# Patient Record
Sex: Male | Born: 1935 | Race: Black or African American | Hispanic: No | Marital: Single | Smoking: Former smoker
Health system: Southern US, Community
[De-identification: ages and names within clinical notes are randomized; demographics above are authoritative.]

## PROBLEM LIST (undated history)

## (undated) DIAGNOSIS — I351 Nonrheumatic aortic (valve) insufficiency: Secondary | ICD-10-CM

## (undated) DIAGNOSIS — M1990 Unspecified osteoarthritis, unspecified site: Secondary | ICD-10-CM

## (undated) DIAGNOSIS — N183 Chronic kidney disease, stage 3 unspecified: Secondary | ICD-10-CM

## (undated) DIAGNOSIS — E785 Hyperlipidemia, unspecified: Secondary | ICD-10-CM

## (undated) DIAGNOSIS — F0390 Unspecified dementia without behavioral disturbance: Secondary | ICD-10-CM

## (undated) DIAGNOSIS — I10 Essential (primary) hypertension: Secondary | ICD-10-CM

## (undated) DIAGNOSIS — I5032 Chronic diastolic (congestive) heart failure: Secondary | ICD-10-CM

## (undated) DIAGNOSIS — I739 Peripheral vascular disease, unspecified: Secondary | ICD-10-CM

## (undated) DIAGNOSIS — J449 Chronic obstructive pulmonary disease, unspecified: Secondary | ICD-10-CM

## (undated) DIAGNOSIS — J123 Human metapneumovirus pneumonia: Secondary | ICD-10-CM

## (undated) DIAGNOSIS — J984 Other disorders of lung: Secondary | ICD-10-CM

## (undated) DIAGNOSIS — I340 Nonrheumatic mitral (valve) insufficiency: Secondary | ICD-10-CM

## (undated) DIAGNOSIS — I2119 ST elevation (STEMI) myocardial infarction involving other coronary artery of inferior wall: Secondary | ICD-10-CM

## (undated) HISTORY — PX: TONSILLECTOMY: SUR1361

## (undated) HISTORY — DX: Hyperlipidemia, unspecified: E78.5

## (undated) HISTORY — PX: LEG SURGERY: SHX1003

## (undated) HISTORY — PX: APPENDECTOMY: SHX54

---

## 1997-12-22 ENCOUNTER — Other Ambulatory Visit: Admission: RE | Admit: 1997-12-22 | Discharge: 1997-12-22 | Payer: Self-pay | Admitting: Internal Medicine

## 2002-04-27 ENCOUNTER — Emergency Department (HOSPITAL_COMMUNITY): Admission: EM | Admit: 2002-04-27 | Discharge: 2002-04-27 | Payer: Self-pay | Admitting: Emergency Medicine

## 2002-12-05 ENCOUNTER — Emergency Department (HOSPITAL_COMMUNITY): Admission: EM | Admit: 2002-12-05 | Discharge: 2002-12-05 | Payer: Self-pay | Admitting: Emergency Medicine

## 2002-12-05 ENCOUNTER — Encounter: Payer: Self-pay | Admitting: Emergency Medicine

## 2003-06-25 ENCOUNTER — Ambulatory Visit (HOSPITAL_COMMUNITY): Admission: RE | Admit: 2003-06-25 | Discharge: 2003-06-25 | Payer: Self-pay | Admitting: Gastroenterology

## 2003-06-25 ENCOUNTER — Encounter (INDEPENDENT_AMBULATORY_CARE_PROVIDER_SITE_OTHER): Payer: Self-pay | Admitting: Specialist

## 2003-07-16 ENCOUNTER — Encounter: Admission: RE | Admit: 2003-07-16 | Discharge: 2003-07-16 | Payer: Self-pay | Admitting: Gastroenterology

## 2004-07-22 ENCOUNTER — Ambulatory Visit: Payer: Self-pay | Admitting: Cardiology

## 2004-07-23 ENCOUNTER — Encounter: Payer: Self-pay | Admitting: Cardiology

## 2004-07-23 ENCOUNTER — Inpatient Hospital Stay (HOSPITAL_COMMUNITY): Admission: EM | Admit: 2004-07-23 | Discharge: 2004-07-24 | Payer: Self-pay | Admitting: Emergency Medicine

## 2004-12-05 ENCOUNTER — Emergency Department (HOSPITAL_COMMUNITY): Admission: EM | Admit: 2004-12-05 | Discharge: 2004-12-06 | Payer: Self-pay | Admitting: *Deleted

## 2005-03-18 ENCOUNTER — Emergency Department (HOSPITAL_COMMUNITY): Admission: EM | Admit: 2005-03-18 | Discharge: 2005-03-18 | Payer: Self-pay | Admitting: Family Medicine

## 2007-05-21 ENCOUNTER — Emergency Department (HOSPITAL_COMMUNITY): Admission: EM | Admit: 2007-05-21 | Discharge: 2007-05-21 | Payer: Self-pay | Admitting: Emergency Medicine

## 2007-06-29 ENCOUNTER — Emergency Department (HOSPITAL_COMMUNITY): Admission: EM | Admit: 2007-06-29 | Discharge: 2007-06-29 | Payer: Self-pay | Admitting: Emergency Medicine

## 2007-07-16 ENCOUNTER — Emergency Department (HOSPITAL_COMMUNITY): Admission: EM | Admit: 2007-07-16 | Discharge: 2007-07-16 | Payer: Self-pay | Admitting: Emergency Medicine

## 2007-07-23 ENCOUNTER — Emergency Department (HOSPITAL_COMMUNITY): Admission: EM | Admit: 2007-07-23 | Discharge: 2007-07-23 | Payer: Self-pay | Admitting: Emergency Medicine
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
## 2010-12-31 NOTE — Op Note (Signed)
[name] [name], [name] [name]                           ACCOUNT NO.:  [id number]   MEDICAL RECORD NO.:  [id number]                   PATIENT TYPE:  AMB   LOCATION:  ENDO                                 FACILITY:  [facility]   PHYSICIAN:  Anselmo Rod, M.D.               DATE OF BIRTH:  [birth year]   DATE OF PROCEDURE:  06/25/2003  DATE OF DISCHARGE:                                 OPERATIVE REPORT   PROCEDURE:  Screening colonoscopy.   ENDOSCOPIST:  Charna Elizabeth, M.D.   INSTRUMENT USED:  Olympus video colonoscope.   INDICATIONS FOR PROCEDURE:  Guaiac positive stools and mild anemia in a 77-  year-old [place] American male, rule out colonic polyps, masses, etc.   PREPROCEDURE PREPARATION:  Informed consent was obtained from the patient.  The patient was fasted for eight hours prior to the procedure and prepped  with a bottle of magnesium citrate and a gallon of GoLYTELY the night prior  to the procedure.   PREPROCEDURE PHYSICAL:  Patient with stable vital signs.  Neck supple.  Chest clear to auscultation.  S1 and S2 regular.  Abdomen soft with normal  bowel sounds.   DESCRIPTION OF PROCEDURE:  The patient was placed in the left lateral  decubitus position, sedated with 100 mg of Demerol and 10 mg Versed for the  EGD, no additional sedation was used for the colonoscopy.  Once the patient  was adequately sedated, maintained on low flow oxygen, continuous cardiac  monitoring, the Olympus video colonoscope was advanced from the rectum to  the proximal right colon with difficulty.  There was a large amount of  residual stool in the colon.  The patient was obese and it was difficult for  [place] to change him from a left lateral decubitus to supine and then right  lateral position as well as gentle abdominal pressure was applied.  In spite  of several measures, we could not visualize the cecal base.  There was a  large amount of residual stool in the right colon.  Multiple washes were  done.   Visualization was not adequate.   IMPRESSION:  1. Incomplete colonoscopy to the proximal right colon, no masses or polyps     seen, no evidence of diverticulosis.  2. Small internal hemorrhoids seen on retroflexion.    RECOMMENDATIONS:  Schedule air contrast barium enema after repeat prep to  rule out colonic masses, etc., and to visualize the cecal base.  Further  recommendations will be made after this.                                               Anselmo Rod, M.D.    JNM/MEDQ  D:  06/25/2003  T:  06/25/2003  Job:  [id number]   cc:  Merlene Laughter. Renae Gloss, M.D.  [address]  [address]  [place]  [place] [address]  Fax: [phone]

## 2010-12-31 NOTE — Op Note (Signed)
[name], [name] [name]                           ACCOUNT NO.:  [id number]   MEDICAL RECORD NO.:  [id number]                   PATIENT TYPE:  AMB   LOCATION:  ENDO                                 FACILITY:  [facility]   PHYSICIAN:  Anselmo Rod, M.D.               DATE OF BIRTH:  11-12-1930   DATE OF PROCEDURE:  06/25/2003  DATE OF DISCHARGE:                                 OPERATIVE REPORT   PROCEDURE:  Esophagogastroduodenoscopy with biopsy.   ENDOSCOPIST:  Charna Elizabeth, M.D.   INSTRUMENT USED:  Olympus video panendoscope.   INDICATIONS FOR PROCEDURE:  77 year old African American male with guaiac  positive stool and anemia, rule out peptic ulcer disease, esophagitis,  gastritis, etc.   PREPROCEDURE PREPARATION:  Informed consent was obtained from the patient.  The patient was fasted for eight hours prior to the procedure.   PREPROCEDURE PHYSICAL:  Patient with stable vital signs.  Neck supple.  Chest clear to auscultation.  S1 and S2 regular.  Abdomen soft with normal  bowel sounds.   DESCRIPTION OF PROCEDURE:  The patient was placed in the left lateral  decubitus position, sedated with 100 mg of Demerol and 10 mg Versed in slow  incremental doses.  Once the patient was adequately sedated, maintained on  low flow oxygen, and continuous cardiac monitoring, the Olympus video  panendoscope was advanced through the mouth piece over the tongue into the  esophagus under direct vision.  The entire esophagus appeared normal with no  evidence of ring, strictures, masses, esophagitis, or Barrett's mucosa.  The  scope was then advanced into the stomach.  A 4 cm hiatal hernia was seen on  high retroflexion.  A small superficial ulcer was seen in the antrum.  Biopsies were done to rule out H. pylori by pathology.  The proximal small  bowel appeared normal.   IMPRESSION:  1. Normal appearing esophagus and proximal small bowel.  2. 3-4 cm  hiatal hernia.  3. Superficial ulcer in the  antrum, biopsies done for H. pylori, results     pending.   RECOMMENDATIONS:  1. Treat with PPI.  2. Give antibiotics if H. pylori present on pathology.  3.     Avoid all nonsteroidals and aspirin for now.  4. Proceed with colonoscopy at this time, further recommendations will be     made after the colonoscopy has been done.                                               Anselmo Rod, M.D.    JNM/MEDQ  D:  06/25/2003  T:  06/25/2003  Job:  [id number]   cc:   Merlene Laughter. Renae Gloss, M.D.  [address]  [address]  [place]  [place] [address]  Fax: [phone]
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
## 2011-05-23 LAB — CBC
HCT: 39.8
HCT: 42.3
Hemoglobin: 13.9
Hemoglobin: 14.4
MCHC: 33.9
MCHC: 34.9
MCV: 97.6
MCV: 98.7
Platelets: 313
Platelets: 333
RBC: 4.07 — ABNORMAL LOW
RBC: 4.29
RDW: 12.6
RDW: 12.7
WBC: 6.7
WBC: 8.4

## 2011-05-23 LAB — POCT I-STAT 3, ART BLOOD GAS (G3+)
Acid-Base Excess: 5 — ABNORMAL HIGH
Bicarbonate: 30.3 — ABNORMAL HIGH
O2 Saturation: 99
Operator id: 296031
TCO2: 32
pCO2 arterial: 46 — ABNORMAL HIGH
pH, Arterial: 7.427
pO2, Arterial: 114 — ABNORMAL HIGH

## 2011-05-23 LAB — DIFFERENTIAL
Basophils Absolute: 0.1
Basophils Absolute: 0.1
Basophils Relative: 1
Basophils Relative: 2 — ABNORMAL HIGH
Eosinophils Absolute: 0.4
Eosinophils Absolute: 0.5
Eosinophils Relative: 5
Eosinophils Relative: 7 — ABNORMAL HIGH
Lymphocytes Relative: 17
Lymphocytes Relative: 24
Lymphs Abs: 1.4
Lymphs Abs: 1.6
Monocytes Absolute: 0.4
Monocytes Absolute: 0.5
Monocytes Relative: 5
Monocytes Relative: 7
Neutro Abs: 4.1
Neutro Abs: 6.1
Neutrophils Relative %: 61
Neutrophils Relative %: 73

## 2011-05-23 LAB — POCT CARDIAC MARKERS
CKMB, poc: 2.3
Myoglobin, poc: 91.2
Operator id: 294341
Troponin i, poc: <0.05

## 2011-05-23 LAB — I-STAT 8, (EC8 V) (CONVERTED LAB)
Acid-Base Excess: 1
BUN: 14
Bicarbonate: 24.2 — ABNORMAL HIGH
Chloride: 97
Glucose, Bld: 192 — ABNORMAL HIGH
HCT: 43
Hemoglobin: 14.6
Operator id: 294341
Potassium: 3.8
Sodium: 130 — ABNORMAL LOW
TCO2: 25
pCO2, Ven: 33.6 — ABNORMAL LOW
pH, Ven: 7.466 — ABNORMAL HIGH

## 2011-05-23 LAB — D-DIMER, QUANTITATIVE: D-Dimer, Quant: 1.5 — ABNORMAL HIGH

## 2011-05-23 LAB — POCT I-STAT CREATININE
Creatinine, Ser: 1.1
Operator id: 294341

## 2011-05-23 LAB — B-NATRIURETIC PEPTIDE (CONVERTED LAB)
Pro B Natriuretic peptide (BNP): 349 — ABNORMAL HIGH
Pro B Natriuretic peptide (BNP): 420 — ABNORMAL HIGH

## 2011-05-24 LAB — BASIC METABOLIC PANEL
BUN: 10
CO2: 24
Calcium: 8.1 — ABNORMAL LOW
Chloride: 99
Creatinine, Ser: 0.88
GFR calc Af Amer: >60
GFR calc non Af Amer: >60
Glucose, Bld: 176 — ABNORMAL HIGH
Potassium: 3.3 — ABNORMAL LOW
Sodium: 132 — ABNORMAL LOW

## 2011-05-24 LAB — CBC
HCT: 39.9
Hemoglobin: 13.7
MCHC: 34.4
MCV: 97.4
Platelets: 331
RBC: 4.1 — ABNORMAL LOW
RDW: 13.1
WBC: 9.5

## 2011-05-24 LAB — POCT CARDIAC MARKERS
CKMB, poc: 3.6
Myoglobin, poc: 100
Operator id: 4708

## 2011-05-24 LAB — DIFFERENTIAL
Basophils Relative: 1
Eosinophils Absolute: 0.6
Lymphs Abs: 1
Neutro Abs: 7.6
Neutrophils Relative %: 80 — ABNORMAL HIGH

## 2011-05-24 LAB — B-NATRIURETIC PEPTIDE (CONVERTED LAB): Pro B Natriuretic peptide (BNP): 284 — ABNORMAL HIGH

## 2011-05-26 LAB — COMPREHENSIVE METABOLIC PANEL
CO2: 26
Calcium: 9
Creatinine, Ser: 0.95
GFR calc Af Amer: >60
GFR calc non Af Amer: >60
Glucose, Bld: 68 — ABNORMAL LOW

## 2011-05-26 LAB — POCT CARDIAC MARKERS
CKMB, poc: 4.9
Myoglobin, poc: 89.6
Operator id: 151321
Troponin i, poc: <0.05

## 2011-08-29 ENCOUNTER — Emergency Department (HOSPITAL_COMMUNITY): Payer: Medicare Other

## 2011-08-29 ENCOUNTER — Encounter (HOSPITAL_COMMUNITY): Payer: Self-pay | Admitting: *Deleted

## 2011-08-29 ENCOUNTER — Inpatient Hospital Stay (HOSPITAL_COMMUNITY)
Admission: EM | Admit: 2011-08-29 | Discharge: 2011-09-01 | DRG: 683 | Disposition: A | Discharge status: Home / Self Care | Payer: Medicare Other | Source: Ambulatory Visit | Attending: Internal Medicine | Admitting: Internal Medicine

## 2011-08-29 ENCOUNTER — Other Ambulatory Visit: Payer: Self-pay

## 2011-08-29 DIAGNOSIS — E860 Dehydration: Secondary | ICD-10-CM | POA: Diagnosis present

## 2011-08-29 DIAGNOSIS — R911 Solitary pulmonary nodule: Secondary | ICD-10-CM | POA: Diagnosis present

## 2011-08-29 DIAGNOSIS — E1110 Type 2 diabetes mellitus with ketoacidosis without coma: Secondary | ICD-10-CM

## 2011-08-29 DIAGNOSIS — E785 Hyperlipidemia, unspecified: Secondary | ICD-10-CM | POA: Diagnosis present

## 2011-08-29 DIAGNOSIS — F411 Generalized anxiety disorder: Secondary | ICD-10-CM | POA: Diagnosis present

## 2011-08-29 DIAGNOSIS — F17200 Nicotine dependence, unspecified, uncomplicated: Secondary | ICD-10-CM | POA: Diagnosis present

## 2011-08-29 DIAGNOSIS — E871 Hypo-osmolality and hyponatremia: Secondary | ICD-10-CM | POA: Diagnosis present

## 2011-08-29 DIAGNOSIS — R739 Hyperglycemia, unspecified: Secondary | ICD-10-CM | POA: Diagnosis present

## 2011-08-29 DIAGNOSIS — N183 Chronic kidney disease, stage 3 unspecified: Secondary | ICD-10-CM | POA: Diagnosis present

## 2011-08-29 DIAGNOSIS — I10 Essential (primary) hypertension: Secondary | ICD-10-CM | POA: Diagnosis present

## 2011-08-29 DIAGNOSIS — N179 Acute kidney failure, unspecified: Principal | ICD-10-CM | POA: Diagnosis present

## 2011-08-29 DIAGNOSIS — IMO0001 Reserved for inherently not codable concepts without codable children: Secondary | ICD-10-CM | POA: Diagnosis present

## 2011-08-29 DIAGNOSIS — E878 Other disorders of electrolyte and fluid balance, not elsewhere classified: Secondary | ICD-10-CM

## 2011-08-29 HISTORY — DX: Essential (primary) hypertension: I10

## 2011-08-29 LAB — GLUCOSE, CAPILLARY: Glucose-Capillary: >600 mg/dL (ref 70–99)

## 2011-08-29 LAB — DIFFERENTIAL
Basophils Absolute: 0.1 10*3/uL (ref 0.0–0.1)
Basophils Relative: 1 % (ref 0–1)
Monocytes Absolute: 0.8 10*3/uL (ref 0.1–1.0)
Neutro Abs: 7.6 10*3/uL (ref 1.7–7.7)
Neutrophils Relative %: 68 % (ref 43–77)

## 2011-08-29 LAB — CBC
MCHC: 36.1 g/dL — ABNORMAL HIGH (ref 30.0–36.0)
Platelets: 459 10*3/uL — ABNORMAL HIGH (ref 150–400)
RDW: 11.8 % (ref 11.5–15.5)

## 2011-08-29 MED ORDER — SODIUM CHLORIDE 0.9 % IV BOLUS (SEPSIS)
1000.0000 mL | Freq: Once | INTRAVENOUS | Status: AC
  Administered 2011-08-29: 1000 mL via INTRAVENOUS

## 2011-08-29 NOTE — ED Notes (Signed)
Pt A&O x4, follows commands, speaks in complete sentences, pt c/o hyperglycemia onset x4 days, anorexia, & HA, NAD

## 2011-08-29 NOTE — ED Provider Notes (Signed)
History     CSN: [id number]  Arrival date & time 08/29/11  2200   First MD Initiated Contact with Patient 08/29/11 2204      Chief Complaint  Patient presents with  . Hyperglycemia    (Consider location/radiation/quality/duration/timing/severity/associated sxs/prior treatment) HPI Comments: Patient reports for general malaise since has not taken his diabetic and antihypertensive medications in the past 4 days. Reports headache here. Denies abdominal pain, nausea, vomiting. Also reports for hyperglycemia. His blood sugar was over 600 @ home.   Patient is a 78 y.o. male presenting with general illness. The history is provided by the patient. No language interpreter was used.  Illness  The current episode started 3 to 5 days ago. The onset was gradual. The problem occurs continuously. The problem has been unchanged. The problem is moderate. The symptoms are relieved by nothing. The symptoms are aggravated by nothing. Associated symptoms include headaches and cough. Pertinent negatives include no fever, no abdominal pain, no diarrhea, no nausea, no vomiting, no URI and no rash.    Past Medical History  Diagnosis Date  . Diabetes mellitus   . Hypertension     No past surgical history on file.  No family history on file.  History  Substance Use Topics  . Smoking status: Not on file  . Smokeless tobacco: Not on file  . Alcohol Use:       Review of Systems  Constitutional: Negative for fever and chills.  Respiratory: Positive for cough. Negative for shortness of breath.   Cardiovascular: Negative for chest pain and leg swelling.  Gastrointestinal: Negative for nausea, vomiting, abdominal pain and diarrhea.  Skin: Negative for rash.  Neurological: Positive for headaches.  All other systems reviewed and are negative.    Allergies  Review of patient's allergies indicates no known allergies.  Home Medications  No current outpatient prescriptions on file.  There were no  vitals taken for this visit.  Physical Exam  Nursing note and vitals reviewed. Constitutional: He is oriented to person, place, and time. He appears well-developed and well-nourished. No distress.  HENT:  Head: Normocephalic and atraumatic.  Mouth/Throat: No oropharyngeal exudate.  Eyes: EOM are normal. Pupils are equal, round, and reactive to light.  Neck: Normal range of motion. Neck supple.  Cardiovascular: Normal rate and regular rhythm.  Exam reveals no friction rub.   No murmur heard. Pulmonary/Chest: Effort normal and breath sounds normal. No respiratory distress. He has no wheezes. He has no rales.  Abdominal: He exhibits no distension. There is no tenderness. There is no rebound.  Musculoskeletal: Normal range of motion. He exhibits no edema.  Neurological: He is alert and oriented to person, place, and time.  Skin: He is not diaphoretic.    ED Course  Procedures (including critical care time)   Labs Reviewed  POCT CBG MONITORING  CBC  DIFFERENTIAL  LACTIC ACID, PLASMA  COMPREHENSIVE METABOLIC PANEL  URINALYSIS, ROUTINE W REFLEX MICROSCOPIC   Dg Chest 2 View  08/29/2011  *RADIOLOGY REPORT*  Clinical Data: Cough, weight loss and headache; history smoking and diabetes.  CHEST - 2 VIEW  Comparison: Chest radiograph performed 07/23/2007  Findings: The lungs are well-aerated.  There is a 2.9 cm rounded opacity at the peripheral left lung base.  This region was not well characterized on multiple prior studies due to effusions; malignancy cannot be excluded.  There is no evidence of pleural effusion or pneumothorax.  The heart is normal in size; the mediastinal contour is within normal limits.  No acute osseous abnormalities are seen.  Density overlying the mediastinum on the lateral view likely reflects epicardial fat.  IMPRESSION: 2.9 cm rounded opacity at the peripheral left lung base. Malignancy cannot be excluded.  CT of the chest would be helpful for further evaluation, when  and as deemed clinically appropriate.  Original Report Authenticated By: Tonia Ghent, M.D.     1. Hyperglycemia   2. DKA (diabetic ketoacidoses)   3. Hypochloremia   4. Hyponatremia   5. ARF (acute renal failure)       MDM  78 year old male presents with hyperglycemia. He has not been taking his diabetic or anti-hypertensive medications for the past 4 days. He left them in his house and has been unable to take. His son states over 60 pounds weight loss over the past 3 months unintentionally. Patient reports mild headache here. No vision changes, gross motor or sensory deficits. No abdominal pain or nausea or vomiting. No chest pain or trouble breathing. Son states he has been sick with a cough for the past several days. Patient's afebrile his vital signs show hypertension with systolic blood pressures of 200. Lungs are clear heart sounds are normal. Belly is soft and benign. No tenderness, guarding, rebound. No gross motor sensory deficits. We'll check labs including glucose, CMP, CBC. EKG and CXR ordered. CXR shows lesion in periphery concerning for malignancy. Labs show ARF - prerenal, hypochloremia. CO2 26. Sodium 118. Glucose 905. Internal Medicine consulted and admitting.       Elwin Mocha, MD 08/30/11 [phone]

## 2011-08-29 NOTE — ED Provider Notes (Signed)
78 year old male has a several days for several weeks of medication noncompliance with his blood pressure diabetes medicine, he also has weight loss of last couple of months as well of uncertain etiology, he denies chest pain shortness breath abdominal pain vomiting rashes confusion and states he just forgets to take his medicines. He does admit to polydipsia and polyuria.  ECG: Sinus rhythm, ventricular rate 80, normal axis, left ventricular hypertrophy, compared with December 2008 left ventricular hypertrophy now present  [name] Horn, MD 09/02/11 1240

## 2011-08-29 NOTE — ED Notes (Signed)
Pts CBG checked Greater then 600 Christa RN notified

## 2011-08-29 NOTE — ED Notes (Signed)
PUlse 90, BP 200/80, 97% on room air, RR 16

## 2011-08-29 NOTE — ED Notes (Signed)
Pt attempting to provide urine sample.  Provided pt with water.

## 2011-08-30 ENCOUNTER — Inpatient Hospital Stay (HOSPITAL_COMMUNITY): Payer: Medicare Other

## 2011-08-30 ENCOUNTER — Encounter (HOSPITAL_COMMUNITY): Payer: Self-pay | Admitting: Internal Medicine

## 2011-08-30 DIAGNOSIS — R911 Solitary pulmonary nodule: Secondary | ICD-10-CM | POA: Diagnosis present

## 2011-08-30 DIAGNOSIS — E860 Dehydration: Secondary | ICD-10-CM | POA: Diagnosis present

## 2011-08-30 DIAGNOSIS — R739 Hyperglycemia, unspecified: Secondary | ICD-10-CM | POA: Diagnosis present

## 2011-08-30 DIAGNOSIS — E871 Hypo-osmolality and hyponatremia: Secondary | ICD-10-CM | POA: Diagnosis present

## 2011-08-30 LAB — GLUCOSE, CAPILLARY
Glucose-Capillary: 403 mg/dL — ABNORMAL HIGH (ref 70–99)
Glucose-Capillary: 237 mg/dL — ABNORMAL HIGH (ref 70–99)
Glucose-Capillary: 166 mg/dL — ABNORMAL HIGH (ref 70–99)
Glucose-Capillary: 135 mg/dL — ABNORMAL HIGH (ref 70–99)
Glucose-Capillary: 186 mg/dL — ABNORMAL HIGH (ref 70–99)
Glucose-Capillary: 201 mg/dL — ABNORMAL HIGH (ref 70–99)
Glucose-Capillary: 216 mg/dL — ABNORMAL HIGH (ref 70–99)
Glucose-Capillary: 384 mg/dL — ABNORMAL HIGH (ref 70–99)

## 2011-08-30 LAB — COMPREHENSIVE METABOLIC PANEL
Albumin: 3.4 g/dL — ABNORMAL LOW (ref 3.5–5.2)
Alkaline Phosphatase: 51 U/L (ref 39–117)
Alkaline Phosphatase: 46 U/L (ref 39–117)
BUN: 64 mg/dL — ABNORMAL HIGH (ref 6–23)
BUN: 60 mg/dL — ABNORMAL HIGH (ref 6–23)
Calcium: 8.9 mg/dL (ref 8.4–10.5)
Creatinine, Ser: 2.53 mg/dL — ABNORMAL HIGH (ref 0.50–1.35)
GFR calc Af Amer: 26 mL/min — ABNORMAL LOW (ref >90)
Glucose, Bld: 176 mg/dL — ABNORMAL HIGH (ref 70–99)
Potassium: 3.7 mEq/L (ref 3.5–5.1)
Potassium: 3.3 mEq/L — ABNORMAL LOW (ref 3.5–5.1)
Sodium: 118 mEq/L — CL (ref 135–145)
Total Protein: 7.3 g/dL (ref 6.0–8.3)
Total Protein: 6.8 g/dL (ref 6.0–8.3)

## 2011-08-30 LAB — CBC
HCT: 33.2 % — ABNORMAL LOW (ref 39.0–52.0)
HCT: 32.3 % — ABNORMAL LOW (ref 39.0–52.0)
Hemoglobin: 12 g/dL — ABNORMAL LOW (ref 13.0–17.0)
MCH: 32.1 pg (ref 26.0–34.0)
MCHC: 36.1 g/dL — ABNORMAL HIGH (ref 30.0–36.0)
RBC: 3.62 MIL/uL — ABNORMAL LOW (ref 4.22–5.81)
RDW: 11.5 % (ref 11.5–15.5)
RDW: 11.5 % (ref 11.5–15.5)
WBC: 11.9 10*3/uL — ABNORMAL HIGH (ref 4.0–10.5)

## 2011-08-30 LAB — HEMOGLOBIN A1C
Hgb A1c MFr Bld: 12.1 % — ABNORMAL HIGH (ref <5.7)
Mean Plasma Glucose: 301 mg/dL — ABNORMAL HIGH (ref <117)

## 2011-08-30 LAB — URINALYSIS, ROUTINE W REFLEX MICROSCOPIC
Glucose, UA: >1000 mg/dL — AB
Ketones, ur: NEGATIVE mg/dL
Leukocytes, UA: NEGATIVE
pH: 5 (ref 5.0–8.0)

## 2011-08-30 LAB — URINE MICROSCOPIC-ADD ON

## 2011-08-30 MED ORDER — ONDANSETRON HCL 4 MG/2ML IJ SOLN: 4.0000 mg | Freq: Four times a day (QID) | INTRAMUSCULAR | Status: DC | PRN

## 2011-08-30 MED ORDER — INSULIN GLARGINE 100 UNIT/ML ~~LOC~~ SOLN
5.0000 [IU] | Freq: Every day | SUBCUTANEOUS | Status: DC
  Administered 2011-08-30: 5 [IU] via SUBCUTANEOUS

## 2011-08-30 MED ORDER — DEXTROSE-NACL 5-0.45 % IV SOLN
INTRAVENOUS | Status: DC
  Administered 2011-08-30: 05:00:00 via INTRAVENOUS

## 2011-08-30 MED ORDER — SODIUM CHLORIDE 0.9 % IV SOLN
INTRAVENOUS | Status: DC
  Administered 2011-08-30: 1000 mL via INTRAVENOUS
  Administered 2011-08-30 (×2): via INTRAVENOUS
  Administered 2011-08-31: 1000 mL via INTRAVENOUS
  Administered 2011-08-31: 15:00:00 via INTRAVENOUS

## 2011-08-30 MED ORDER — SODIUM CHLORIDE 0.9 % IV SOLN
INTRAVENOUS | Status: DC
  Filled 2011-08-30: qty 1

## 2011-08-30 MED ORDER — SODIUM CHLORIDE 0.9 % IV SOLN
INTRAVENOUS | Status: DC
  Administered 2011-08-30: 5.4 [IU]/h via INTRAVENOUS
  Filled 2011-08-30: qty 1

## 2011-08-30 MED ORDER — POTASSIUM CHLORIDE CRYS ER 20 MEQ PO TBCR
40.0000 meq | EXTENDED_RELEASE_TABLET | ORAL | Status: AC
  Administered 2011-08-30 (×2): 40 meq via ORAL
  Filled 2011-08-30 (×2): qty 2

## 2011-08-30 MED ORDER — ENOXAPARIN SODIUM 30 MG/0.3ML ~~LOC~~ SOLN
30.0000 mg | Freq: Every day | SUBCUTANEOUS | Status: DC
  Administered 2011-08-30 – 2011-09-01 (×3): 30 mg via SUBCUTANEOUS
  Filled 2011-08-30 (×3): qty 0.3

## 2011-08-30 MED ORDER — DEXTROSE 50 % IV SOLN: 25.0000 mL | INTRAVENOUS | Status: DC | PRN

## 2011-08-30 MED ORDER — SODIUM CHLORIDE 0.9 % IV BOLUS (SEPSIS)
1000.0000 mL | Freq: Once | INTRAVENOUS | Status: AC
  Administered 2011-08-30: 1000 mL via INTRAVENOUS

## 2011-08-30 MED ORDER — INSULIN REGULAR BOLUS VIA INFUSION
0.0000 [IU] | Freq: Three times a day (TID) | INTRAVENOUS | Status: DC
  Administered 2011-08-30: 7.3 [IU] via INTRAVENOUS
  Filled 2011-08-30 (×7): qty 10

## 2011-08-30 MED ORDER — SODIUM CHLORIDE 0.9 % IV SOLN: INTRAVENOUS | Status: DC

## 2011-08-30 MED ORDER — PNEUMOCOCCAL VAC POLYVALENT 25 MCG/0.5ML IJ INJ
0.5000 mL | INJECTION | INTRAMUSCULAR | Status: AC
  Administered 2011-08-31: 0.5 mL via INTRAMUSCULAR
  Filled 2011-08-30: qty 0.5

## 2011-08-30 MED ORDER — INFLUENZA VIRUS VACC SPLIT PF IM SUSP
0.5000 mL | INTRAMUSCULAR | Status: AC
  Administered 2011-08-31: 0.5 mL via INTRAMUSCULAR
  Filled 2011-08-30: qty 0.5

## 2011-08-30 MED ORDER — INSULIN REGULAR BOLUS VIA INFUSION: 0.0000 [IU] | Freq: Three times a day (TID) | INTRAVENOUS | Status: DC

## 2011-08-30 MED ORDER — INSULIN ASPART 100 UNIT/ML ~~LOC~~ SOLN
0.0000 [IU] | Freq: Three times a day (TID) | SUBCUTANEOUS | Status: DC
  Administered 2011-08-30: 8 [IU] via SUBCUTANEOUS
  Administered 2011-08-31: 15 [IU] via SUBCUTANEOUS
  Administered 2011-08-31: 11 [IU] via SUBCUTANEOUS
  Administered 2011-09-01 (×2): 5 [IU] via SUBCUTANEOUS
  Filled 2011-08-30 (×2): qty 3

## 2011-08-30 MED ORDER — ONDANSETRON HCL 4 MG PO TABS: 4.0000 mg | ORAL_TABLET | Freq: Four times a day (QID) | ORAL | Status: DC | PRN

## 2011-08-30 MED ORDER — ACETAMINOPHEN 325 MG PO TABS: 650.0000 mg | ORAL_TABLET | Freq: Four times a day (QID) | ORAL | Status: DC | PRN

## 2011-08-30 MED ORDER — ACETAMINOPHEN 650 MG RE SUPP: 650.0000 mg | Freq: Four times a day (QID) | RECTAL | Status: DC | PRN

## 2011-08-30 MED ORDER — HYDROCODONE-ACETAMINOPHEN 5-325 MG PO TABS
1.0000 | ORAL_TABLET | ORAL | Status: DC | PRN
  Filled 2011-08-30: qty 2

## 2011-08-30 MED ORDER — INSULIN GLARGINE 100 UNIT/ML ~~LOC~~ SOLN
15.0000 [IU] | Freq: Once | SUBCUTANEOUS | Status: AC
  Administered 2011-08-30: 15 [IU] via SUBCUTANEOUS
  Filled 2011-08-30: qty 3

## 2011-08-30 NOTE — Progress Notes (Signed)
Patient npo 

## 2011-08-30 NOTE — Progress Notes (Signed)
Visit with patient.  Three sons present at bedside also.  Attempted to assess patient's diabetes self-care and identify barriers as to why he stopped taking DM meds.  Patient states he realizes it was a big mistake not to take his medicine.  When discussing monitoring at home, patient made a statement that didn't make much sense.  One son states that patient is starting to slur speech a little as he did when glucose level was very high.  Nursing staff to check CBG.  Will defer rest of assessment until tomorrow.

## 2011-08-30 NOTE — Progress Notes (Signed)
Subjective: Weak, a little lethargic; but overall feeling better.  Objective: Vital signs in last 24 hours: Temp:  [97.8 F (36.6 C)-98.5 F (36.9 C)] 97.8 F (36.6 C) (01/15 0926) Pulse Rate:  [67-80] 67  (01/15 0926) Resp:  [15-18] 18  (01/15 0926) BP: (76-157)/(35-81) 118/50 mmHg (01/15 0926) SpO2:  [92 %-100 %] 96 % (01/15 0926) Weight:  [92.3 kg (203 lb 7.8 oz)] 92.3 kg (203 lb 7.8 oz) (01/15 0220) Weight change:  Last BM Date: 08/28/11  Intake/Output from previous day:   Total I/O In: 0  Out: 275 [Urine:275]   Physical Exam: General: Alert, awake, oriented x3, in no acute distress. HEENT: No bruits, no goiter. Heart: Regular rate and rhythm, without murmurs, rubs, gallops. Lungs: Clear to auscultation bilaterally. Abdomen: Soft, nontender, nondistended, positive bowel sounds. Extremities: No clubbing cyanosis or edema with positive pedal pulses. Neuro: Grossly intact, nonfocal.    Lab Results: Basic Metabolic Panel:  Basename 08/30/11 0530 08/30/11 0240 08/29/11 2254  NA 131* -- 118*  K 3.3* -- 3.7  CL 90* -- 75*  CO2 29 -- 26  GLUCOSE 176* -- 905*  BUN 60* -- 64*  CREATININE 2.53* 2.39* --  CALCIUM 8.9 -- 9.0  MG -- -- --  PHOS -- -- --   Liver Function Tests:  [facility] 08/30/11 0530 08/29/11 2254  AST 25 19  ALT 11 12  ALKPHOS 46 51  BILITOT 0.6 0.8  PROT 6.8 7.3  ALBUMIN 3.0* 3.4*   CBC:  Basename 08/30/11 0530 08/30/11 0240 08/29/11 2254  WBC 11.9* 11.0* --  NEUTROABS -- -- 7.6  HGB 11.5* 12.0* --  HCT 32.3* 33.2* --  MCV 89.2 88.8 --  PLT 467* 505* --   CBG:  Basename 08/30/11 0806 08/30/11 0703 08/30/11 0554 08/30/11 0446 08/30/11 0339 08/30/11 0217  GLUCAP 135* 127* 166* 237* 403* >600*    Studies/Results: Dg Chest 2 View  08/29/2011  *RADIOLOGY REPORT*  Clinical Data: Cough, weight loss and headache; history smoking and diabetes.  CHEST - 2 VIEW  Comparison: Chest radiograph performed 07/23/2007  Findings: The lungs are  well-aerated.  There is a 2.9 cm rounded opacity at the peripheral left lung base.  This region was not well characterized on multiple prior studies due to effusions; malignancy cannot be excluded.  There is no evidence of pleural effusion or pneumothorax.  The heart is normal in size; the mediastinal contour is within normal limits.  No acute osseous abnormalities are seen.  Density overlying the mediastinum on the lateral view likely reflects epicardial fat.  IMPRESSION: 2.9 cm rounded opacity at the peripheral left lung base. Malignancy cannot be excluded.  CT of the chest would be helpful for further evaluation, when and as deemed clinically appropriate.  Original Report Authenticated By: Tonia Ghent, M.D.    Medications: Scheduled Meds:   . enoxaparin  30 mg Subcutaneous Daily  . insulin aspart  0-15 Units Subcutaneous TID WC  . insulin glargine  15 Units Subcutaneous Once  . insulin glargine  5 Units Subcutaneous QHS  . insulin regular  0-10 Units Intravenous TID WC  . sodium chloride  1,000 mL Intravenous Once  . sodium chloride  1,000 mL Intravenous Once  . DISCONTD: insulin regular  0-10 Units Intravenous TID WC   Continuous Infusions:   . sodium chloride 150 mL/hr at 08/30/11 0459  . insulin (NOVOLIN-R) infusion    . DISCONTD: sodium chloride    . DISCONTD: dextrose 5 % and 0.45% NaCl 50  mL/hr at 08/30/11 0459  . DISCONTD: insulin (NOVOLIN-R) infusion    . DISCONTD: insulin (NOVOLIN-R) infusion 5.4 Units/hr (08/30/11 0115)  . DISCONTD: insulin (NOVOLIN-R) infusion     PRN Meds:.acetaminophen, acetaminophen, dextrose, HYDROcodone-acetaminophen, ondansetron (ZOFRAN) IV, ondansetron, DISCONTD: dextrose  Assessment/Plan: 1-Hyperglycemia: improved with glucostabilizer; will transition to SSI and lantus. Continue IV fluids and advance his diet. BMET in am.  2-ARF (acute renal failure): will follow renal function; continue IVF if no improvements by tomorrow; will check renal  US.  3-Dehydration: continue IVF and advance his diet.  4-Hyponatremia: pseudo-hyponatremia; pretty much resolved with correction of hyperglycemia.  5-Lung nodule: will follow chest CT.  6-DVT:Lovenox    LOS: 1 day   Fredis Malkiewicz [facility] [phone]  08/30/2011, 10:18 AM

## 2011-08-30 NOTE — H&P (Signed)
[name] [name] is an 78 y.o. male.   Chief Complaint: High Blood Su HPI: 78 yo Diabetic who has not taken his medications for about 4 days here with generalized malaise, weakness and blood sugar of more than 600. Has dry mouth, polydipsia and polyuria. No dizziness, no NVD. Has incidental finding of Hyponatremia, dehydration and ARF. Also, pseudohyponatremia.   Past Medical History  Diagnosis Date  . Diabetes mellitus   . Hypertension     History reviewed. No pertinent past surgical history.  History reviewed. No pertinent family history. Social History:  reports that he has been smoking.  He does not have any smokeless tobacco history on file. He reports that he does not drink alcohol or use illicit drugs.  Allergies: No Known Allergies  Medications: Unknown  Results for orders placed during the hospital encounter of 08/29/11 (from the past 48 hour(s))  GLUCOSE, CAPILLARY     Status: Abnormal   Collection Time   08/29/11 10:53 PM      Component Value Range Comment   Glucose-Capillary >600 (*) 70 - 99 (mg/dL)    Comment 1 Documented in Chart      Comment 2 Notify RN     CBC     Status: Abnormal   Collection Time   08/29/11 10:54 PM      Component Value Range Comment   WBC 11.1 (*) 4.0 - 10.5 (K/uL)    RBC 3.99 (*) 4.22 - 5.81 (MIL/uL)    Hemoglobin 13.0  13.0 - 17.0 (g/dL)    HCT 16.1 (*) 09.6 - 52.0 (%)    MCV 90.2  78.0 - 100.0 (fL)    MCH 32.6  26.0 - 34.0 (pg)    MCHC 36.1 (*) 30.0 - 36.0 (g/dL)    RDW 04.5  40.9 - 81.1 (%)    Platelets 459 (*) 150 - 400 (K/uL)   DIFFERENTIAL     Status: Normal   Collection Time   08/29/11 10:54 PM      Component Value Range Comment   Neutrophils Relative 68  43 - 77 (%)    Neutro Abs 7.6  1.7 - 7.7 (K/uL)    Lymphocytes Relative 21  12 - 46 (%)    Lymphs Abs 2.3  0.7 - 4.0 (K/uL)    Monocytes Relative 7  3 - 12 (%)    Monocytes Absolute 0.8  0.1 - 1.0 (K/uL)    Eosinophils Relative 3  0 - 5 (%)    Eosinophils Absolute 0.3  0.0 -  0.7 (K/uL)    Basophils Relative 1  0 - 1 (%)    Basophils Absolute 0.1  0.0 - 0.1 (K/uL)   COMPREHENSIVE METABOLIC PANEL     Status: Abnormal   Collection Time   08/29/11 10:54 PM      Component Value Range Comment   Sodium 118 (*) 135 - 145 (mEq/L)    Potassium 3.7  3.5 - 5.1 (mEq/L)    Chloride 75 (*) 96 - 112 (mEq/L)    CO2 26  19 - 32 (mEq/L)    Glucose, Bld 905 (*) 70 - 99 (mg/dL)    BUN 64 (*) 6 - 23 (mg/dL)    Creatinine, Ser 9.14 (*) 0.50 - 1.35 (mg/dL)    Calcium 9.0  8.4 - 10.5 (mg/dL)    Total Protein 7.3  6.0 - 8.3 (g/dL)    Albumin 3.4 (*) 3.5 - 5.2 (g/dL)    AST 19  0 - 37 (U/L)  ALT 12  0 - 53 (U/L)    Alkaline Phosphatase 51  39 - 117 (U/L)    Total Bilirubin 0.8  0.3 - 1.2 (mg/dL)    GFR calc non Af Amer 22 (*) >90 (mL/min)    GFR calc Af Amer 26 (*) >90 (mL/min)   LACTIC ACID, PLASMA     Status: Abnormal   Collection Time   08/29/11 11:02 PM      Component Value Range Comment   Lactic Acid, Venous 2.5 (*) 0.5 - 2.2 (mmol/L)    Dg Chest 2 View  08/29/2011  *RADIOLOGY REPORT*  Clinical Data: Cough, weight loss and headache; history smoking and diabetes.  CHEST - 2 VIEW  Comparison: Chest radiograph performed 07/23/2007  Findings: The lungs are well-aerated.  There is a 2.9 cm rounded opacity at the peripheral left lung base.  This region was not well characterized on multiple prior studies due to effusions; malignancy cannot be excluded.  There is no evidence of pleural effusion or pneumothorax.  The heart is normal in size; the mediastinal contour is within normal limits.  No acute osseous abnormalities are seen.  Density overlying the mediastinum on the lateral view likely reflects epicardial fat.  IMPRESSION: 2.9 cm rounded opacity at the peripheral left lung base. Malignancy cannot be excluded.  CT of the chest would be helpful for further evaluation, when and as deemed clinically appropriate.  Original Report Authenticated By: Tonia Ghent, M.D.    Review of  Systems  Constitutional: Positive for malaise/fatigue.  HENT: Negative.   Eyes: Negative.   Respiratory: Negative.   Cardiovascular: Negative.   Gastrointestinal: Negative.   Genitourinary: Negative.   Musculoskeletal: Negative.   Skin: Negative.   Neurological: Positive for weakness.  Endo/Heme/Allergies: Negative.   Psychiatric/Behavioral: Negative.     Blood pressure 108/81, pulse 77, resp. rate 15, SpO2 98.00%. Physical Exam  Constitutional: He is oriented to person, place, and time. He appears well-developed and well-nourished.  HENT:  Head: Normocephalic and atraumatic.  Right Ear: External ear normal.  Left Ear: External ear normal.  Nose: Nose normal.  Mouth/Throat: Oropharynx is clear and moist.  Eyes: Conjunctivae and EOM are normal. Pupils are equal, round, and reactive to light.  Neck: Normal range of motion. Neck supple.  Cardiovascular: Normal rate, regular rhythm, normal heart sounds and intact distal pulses.   Respiratory: Effort normal and breath sounds normal.  GI: Soft. Bowel sounds are normal.  Musculoskeletal: Normal range of motion.  Neurological: He is alert and oriented to person, place, and time. He has normal reflexes.  Skin: Skin is warm and dry.  Psychiatric: He has a normal mood and affect. His behavior is normal. Judgment and thought content normal.     Assessment/Plan 1. Hyperglycemia: Hydrate with saline, start Glucose stabilizer 2. ARF: probably pre-renal. Hydrate and follow creatinine level 3. Dehydration: Will hydrate 4. Lung nodule: Will check Chest CT to better characterize the lesion 5. Pseudohyponatremia: Due to high glucose. Hydrate, IV insulin, follow sodium level.  [name],LAWAL 08/30/2011, 1:22 AM

## 2011-08-31 LAB — GLUCOSE, CAPILLARY: Glucose-Capillary: 329 mg/dL — ABNORMAL HIGH (ref 70–99)

## 2011-08-31 MED ORDER — INSULIN PEN STARTER KIT
1.0000 | Freq: Once | Status: DC
  Filled 2011-08-31: qty 1

## 2011-08-31 MED ORDER — INSULIN GLARGINE 100 UNIT/ML ~~LOC~~ SOLN
30.0000 [IU] | Freq: Every day | SUBCUTANEOUS | Status: DC
  Administered 2011-08-31: 30 [IU] via SUBCUTANEOUS

## 2011-08-31 MED ORDER — NAPHAZOLINE-PHENIRAMINE 0.025-0.3 % OP SOLN
2.0000 [drp] | Freq: Four times a day (QID) | OPHTHALMIC | Status: DC | PRN
  Filled 2011-08-31: qty 15

## 2011-08-31 MED ORDER — INSULIN ASPART 100 UNIT/ML ~~LOC~~ SOLN
20.0000 [IU] | Freq: Once | SUBCUTANEOUS | Status: AC
  Administered 2011-08-31: 20 [IU] via SUBCUTANEOUS

## 2011-08-31 MED ORDER — LIVING WELL WITH DIABETES BOOK
Freq: Once | Status: DC
  Filled 2011-08-31: qty 1

## 2011-08-31 MED ORDER — WHITE PETROLATUM GEL
Status: AC
  Administered 2011-08-31: 12:00:00
  Filled 2011-08-31: qty 5

## 2011-08-31 NOTE — Plan of Care (Signed)
Problem: Consults Goal: Diagnosis-Diabetes Mellitus Outcome: Not Applicable Date Met:  08/31/11 Hyperglycemia

## 2011-08-31 NOTE — Progress Notes (Signed)
Pt admitted with hyperglycemia.  Lab glucose 905 mg/dl on admission.  Went in to speak with this patient.  His son and grandson were at the bedside.  Attempted to evaluate this patient's home DM care regimen.  Was often interrupted by patient's son who had questions about his own health.  Attempted to redirect the conversation back to the patient.  Per pt, he sees Dr. Andi Devon here in [place].  Last saw Dr. Renae Gloss in late November of last year.  Pt told me Dr. Renae Gloss d/c'd 3 of his home pills, but was unable to tell me which ones.  Not sure if Januvia is the only thing pt is taking at home to control his CBGs.  Pt was sure that he has never taken insulin.  Pt told me he does have a CBG meter but his son told me he doesn't know where it is.  Pt told me he does know where the CBG meter is, he just doesn't want to check his CBGs b/c it "stresses him out".    Discussed pt's A1C of 12.1% with him.  Pt did not know what an A1C was.  Reviewed his results and reminded pt that the ADA Standards of Care state a pt with DM should have an A1C around 7%.  Encouraged pt to check his CBGs tid at home before meals and to record.  Also encouraged pt to follow up with his primary care MD.  Reviewed the pathophysiology behind DM with pt and discussed the importance of good CBG control to maintain health and prevent long-term and acute complications.  Pt showed a small amount of interest in listening to what I had to say.  Expect poor to fair compliance.  Also discussed the possibility of pt needing insulin at some point based on his A1C results.  Pt said he would be willing to learn just in case.    CBGs today: 329/ 445 mg/dl  Pt received a total of 20 units Lantus yesterday.  Only scheduled to get 5 units Lantus tonight.  Fasting sugar extremely elevated this morning.  CBG >400 at lunch.  Recommend the following: 1. Increase Lantus to 30 units QHS  2. Add Meal Coverage- Novolog 4 units tid with meals 3. MD-  Please order "Consult Diabetes OP Education" so pt can attend DM classes after d/c  Will follow.

## 2011-08-31 NOTE — Progress Notes (Signed)
Subjective: Feeling better. No CP, no N/V and at this point cooperative with examination and AAOX3. Patient reports some dry eyes discomfort.  Objective: Vital signs in last 24 hours: Temp:  [98.5 F (36.9 C)-99.7 F (37.6 C)] 99.7 F (37.6 C) (01/16 1847) Pulse Rate:  [61-80] 80  (01/16 1847) Resp:  [18-20] 20  (01/16 1847) BP: (146-174)/(55-73) 155/69 mmHg (01/16 1847) SpO2:  [97 %-99 %] 97 % (01/16 1847) Weight change:  Last BM Date: 08/28/11  Intake/Output from previous day: 01/15 0701 - 01/16 0700 In: 3957.5 [P.O.:2040; I.V.:1917.5] Out: 2250 [Urine:2250]     Physical Exam: General: Alert, awake, oriented x3, in no acute distress. HEENT: No bruits, no goiter. Heart: Regular rate and rhythm, without murmurs, rubs, gallops. Lungs: Clear to auscultation bilaterally. Abdomen: Soft, nontender, nondistended, positive bowel sounds. Extremities: No clubbing cyanosis or edema with positive pedal pulses. Neuro: Grossly intact, nonfocal.  Lab Results: Basic Metabolic Panel:  Basename 08/30/11 0530 08/30/11 0240 08/29/11 2254  NA 131* -- 118*  K 3.3* -- 3.7  CL 90* -- 75*  CO2 29 -- 26  GLUCOSE 176* -- 905*  BUN 60* -- 64*  CREATININE 2.53* 2.39* --  CALCIUM 8.9 -- 9.0  MG -- -- --  PHOS -- -- --   Liver Function Tests:  [facility] 08/30/11 0530 08/29/11 2254  AST 25 19  ALT 11 12  ALKPHOS 46 51  BILITOT 0.6 0.8  PROT 6.8 7.3  ALBUMIN 3.0* 3.4*   CBC:  Basename 08/30/11 0530 08/30/11 0240 08/29/11 2254  WBC 11.9* 11.0* --  NEUTROABS -- -- 7.6  HGB 11.5* 12.0* --  HCT 32.3* 33.2* --  MCV 89.2 88.8 --  PLT 467* 505* --   CBG:  Basename 08/31/11 1713 08/31/11 1218 08/31/11 0810 08/30/11 2117 08/30/11 1633 08/30/11 1400  GLUCAP 367* 445* 329* 384* 253* 216*    Studies/Results: Dg Chest 2 View  08/29/2011  *RADIOLOGY REPORT*  Clinical Data: Cough, weight loss and headache; history smoking and diabetes.  CHEST - 2 VIEW  Comparison: Chest radiograph  performed 07/23/2007  Findings: The lungs are well-aerated.  There is a 2.9 cm rounded opacity at the peripheral left lung base.  This region was not well characterized on multiple prior studies due to effusions; malignancy cannot be excluded.  There is no evidence of pleural effusion or pneumothorax.  The heart is normal in size; the mediastinal contour is within normal limits.  No acute osseous abnormalities are seen.  Density overlying the mediastinum on the lateral view likely reflects epicardial fat.  IMPRESSION: 2.9 cm rounded opacity at the peripheral left lung base. Malignancy cannot be excluded.  CT of the chest would be helpful for further evaluation, when and as deemed clinically appropriate.  Original Report Authenticated By: Tonia Ghent, M.D.   Ct Chest Wo Contrast  08/30/2011  *RADIOLOGY REPORT*  Clinical Data: Lung nodule, weakness  CT CHEST WITHOUT CONTRAST  Technique:  Multidetector CT imaging of the chest was performed following the standard protocol without IV contrast.  Comparison: Chest radiograph 06/28/2012  Findings: Nodular thickening at the left lung base described on comparison plain film corresponds to pleural thickening along the left lateral pleural space.  There is a 6 mm pulmonary nodule in the inferior left upper lobe (image 36).  Mild  central lobular emphysema at the lung apices.  No axillary or supraclavicular lymphadenopathy.  There are small paratracheal lymph nodes which are calcified measuring up to 10 mm. No pericardial fluid.  Esophagus  is normal.  Limited view of the upper abdomen is unremarkable.  Limited view of the skeleton is unremarkable.  IMPRESSION:  1. A 6 mm pulmonary nodule in the inferior left upper lobe. If the patient is at high risk for bronchogenic carcinoma, follow-up chest CT at 6-12 months is recommended.  If the patient is at low risk for bronchogenic carcinoma, follow-up chest CT at 12 months is recommended.  This recommendation follows the consensus  statement: Guidelines for Management of Small Pulmonary Nodules Detected on CT Scans: A Statement from the Fleischner Society as published in Radiology 2005; 237:395-400. Online at: DietDisorder.cz.  2.  Lesion on the comparison chest radiograph correlates to pleural thickening which appears benign. 3.  Borderline mediastinal adenopathy is likely reactive and related to emphysema.  Original Report Authenticated By: Genevive Bi, M.D.    Medications: Scheduled Meds:    . enoxaparin  30 mg Subcutaneous Daily  . Flexpen Starter Kit  1 kit Other Once  . influenza  inactive virus vaccine  0.5 mL Intramuscular Tomorrow-1000  . insulin aspart  0-15 Units Subcutaneous TID WC  . insulin aspart  20 Units Subcutaneous Once  . insulin glargine  30 Units Subcutaneous QHS  . living well with diabetes book   Does not apply Once  . pneumococcal 23 valent vaccine  0.5 mL Intramuscular Tomorrow-1000  . white petrolatum      . DISCONTD: insulin glargine  5 Units Subcutaneous QHS  . DISCONTD: insulin regular  0-10 Units Intravenous TID WC   Continuous Infusions:    . sodium chloride 1,000 mL (08/31/11 2133)  . DISCONTD: insulin (NOVOLIN-R) infusion Stopped (08/30/11 1415)   PRN Meds:.acetaminophen, acetaminophen, dextrose, HYDROcodone-acetaminophen, naphazoline-pheniramine, ondansetron (ZOFRAN) IV, ondansetron  Assessment/Plan: 1-Hyperglycemia with uncontrolled DM: will start Lantus 30 units at bedtime and continue SSI. Patient will require insulin at discharge and close followup from PCP, for further medications adjustments. A1C 12.1  2-ARF (acute renal failure): BMET in am; no BMET drawn today; but patient hydration and intake a lot better in general.  3-Dehydration: Resolved.  4-Hyponatremia: Corrected. Will follow BMET in am.  5-Lung nodule:CT showing small nodule and emphysematous changes; no mass or PNA; due to risk fx's and hx of tobacco abuse;  recommendations are for repeat CT in 6 months; at this point unable to r/o malignancy and to small for biopsy.  6-Tobacco abuse:cessation counseling provided; patient is willing to quit.  7-Dry eyes: will use PRN visine for this problem; no vision changes reported.  8-DVT:Lovenox    LOS: 2 days   Cristel Rail [facility] [phone]  08/31/2011, 10:04 PM

## 2011-08-31 NOTE — Progress Notes (Signed)
Utilization Review Completed.Wilkins Elpers T1/16/2013   

## 2011-09-01 LAB — BASIC METABOLIC PANEL
BUN: 28 mg/dL — ABNORMAL HIGH (ref 6–23)
CO2: 26 mEq/L (ref 19–32)
Chloride: 96 mEq/L (ref 96–112)
GFR calc non Af Amer: 41 mL/min — ABNORMAL LOW (ref >90)
Glucose, Bld: 238 mg/dL — ABNORMAL HIGH (ref 70–99)
Potassium: 3.2 mEq/L — ABNORMAL LOW (ref 3.5–5.1)
Sodium: 129 mEq/L — ABNORMAL LOW (ref 135–145)

## 2011-09-01 LAB — CBC
HCT: 29.8 % — ABNORMAL LOW (ref 39.0–52.0)
Hemoglobin: 10.5 g/dL — ABNORMAL LOW (ref 13.0–17.0)
RBC: 3.3 MIL/uL — ABNORMAL LOW (ref 4.22–5.81)
WBC: 8.8 10*3/uL (ref 4.0–10.5)

## 2011-09-01 LAB — GLUCOSE, CAPILLARY: Glucose-Capillary: 216 mg/dL — ABNORMAL HIGH (ref 70–99)

## 2011-09-01 MED ORDER — "PEN NEEDLES 5/16"" 30G X 8 MM MISC": 1.0000 "pen " | Freq: Every day | Status: DC

## 2011-09-01 MED ORDER — INSULIN PEN STARTER KIT: 1.0000 | Freq: Once | Status: DC

## 2011-09-01 MED ORDER — AMLODIPINE BESYLATE 5 MG PO TABS: 5.0000 mg | ORAL_TABLET | Freq: Every day | ORAL | Status: DC

## 2011-09-01 MED ORDER — LIVING WELL WITH DIABETES BOOK: 1.0000 | Freq: Once | Status: DC

## 2011-09-01 MED ORDER — INSULIN GLARGINE 100 UNIT/ML ~~LOC~~ SOLN: 30.0000 [IU] | Freq: Every day | SUBCUTANEOUS | Status: DC

## 2011-09-01 MED ORDER — HYDROCODONE-ACETAMINOPHEN 5-325 MG PO TABS: 1.0000 | ORAL_TABLET | ORAL | Status: AC | PRN

## 2011-09-01 MED ORDER — GLIPIZIDE 5 MG PO TABS: 5.0000 mg | ORAL_TABLET | Freq: Two times a day (BID) | ORAL | Status: DC

## 2011-09-01 NOTE — Progress Notes (Signed)
Patient  Pulled  Apart  Iv  Line    Brought  Iv pole  To hallway  Stated " That  Was  Enough of  That  , Ive had it  For  3  Days now  i need  Some  Sleep "  Refused  To  Let  Me  restar t  Iv fluids

## 2011-09-01 NOTE — Progress Notes (Signed)
Pt. And his son got the discharge orders,education was given about insulin and novo log,how to give the injection,explain about diet,when to call MD, follow up appointment, which medications to stop,both verbalized their understanding.Questions were answer.Both pens were given to pt. And the start kit as well.

## 2011-09-01 NOTE — Progress Notes (Signed)
CARE MANAGEMENT NOTE 09/01/2011  Patient:  [name] [name]   Account Number:  [id number]  Date Initiated:  08/30/2011  Documentation initiated by:  Alvira Philips Assessment:   78 yr-old male adm with hyperglycemia; lives with son.     Action/Plan:   Anticipated DC Date:  09/02/2011   Anticipated DC Plan:  HOME W HOME HEALTH SERVICES      DC Planning Services  CM consult      [facility] Choice  HOME HEALTH   Choice offered to / List presented to:  C-1 Patient        HH arranged  HH-1 RN      [facility] agency  [facility]   Status of service:   Medicare Important Message given?   (If response is "NO", the following Medicare IM given date fields will be blank) Date Medicare IM given:   Date Additional Medicare IM given:    Discharge Disposition:  HOME W HOME HEALTH SERVICES  Per UR Regulation:  Reviewed for med. necessity/level of care/duration of stay  Comments:  PCP:  Dr. Andi Devon  Contact:  Gabriel Rung, son  [phone] 1/17 spoke w pt, he plans to get lady named mamy to help him at home, he also would like [facility] to [facility], ref to [facility], copy of hhc agency list on chart. Suella Grove [phone] 08/31/11 1413 Henrietta Mayo RN MSN CCM Noted that pt had not taken meds for 4 days, pt states he forgot.  Discussed home health RN to teach him to fill med box, assess/monitor CBGs, BP, etc.  Pt agrees, states he needs some help.  Provided list of [facility] agencies, pt wants to keep list and requests CM return tomorrow for his choice. HOME HEALTH AGENCIES SERVING [place]   Agencies that are Medicare-Certified and are affiliated with [facility]  Telephone Number Address  [facility]   [facility] has ownership interest in this company; however, you are under no obligation to use this agency. [phone] or  [phone] [address] [place], [place] [address]   Agencies that are Medicare-Certified and are not affiliated with [facility] Telephone Number Address  [facility] [phone] Fax [phone] [address], [address] [place], [place]  [address]  [facility] [phone] or [phone] Fax [phone] [address] [place], [place] [address]  [facility] [phone] Fax [phone] [address] [place], [place] [address]  [facility] [phone] Fax [phone] [address] [place], [place]  [address]  [facility] [phone] Fax [phone] [address] [place], [place] [address]  [facility] [phone] [address] [place], [place] [address]  [facility] [phone]  [address] [place], [place] [address]  [facility] [phone] or [phone] Fax [phone]  [address] [place], [place]  [address]  [facility] [phone] Fax [phone] [address] [place], [place]  [address]  [facility]  [phone] Fax [phone] [address] [place], [place] [address]

## 2011-09-01 NOTE — Discharge Summary (Signed)
Physician Discharge Summary  Patient ID: [name] [name] MRN: [id number] DOB/AGE: [birth year] 76 y.o.  Admit date: 08/29/2011 Discharge date: 09/01/2011  Primary Care Physician:  Andi Devon   Discharge Diagnoses:   1-Hyperglycemia and uncontrolled DM 2. 2-ARF (acute renal failure) 3-Dehydration 4-Pseudo-Hyponatremia 5-Lung nodule 6-HTN 7-Dyslipidemia 8-anxiety 9-Tobacco abuse.  Current Discharge Medication List    START taking these medications   Details  amLODipine (NORVASC) 5 MG tablet Take 1 tablet (5 mg total) by mouth daily. Qty: 30 tablet, Refills: 1    Flexpen Starter Kit MISC 1 kit by Other route once.    glipiZIDE (GLUCOTROL) 5 MG tablet Take 1 tablet (5 mg total) by mouth 2 (two) times daily before a meal. Qty: 60 tablet, Refills: 1    HYDROcodone-acetaminophen (NORCO) 5-325 MG per tablet Take 1-2 tablets by mouth every 4 (four) hours as needed. Qty: 30 tablet, Refills: 0    insulin glargine (LANTUS) 100 UNIT/ML injection Inject 30 Units into the skin at bedtime. Qty: 3 mL, Refills: 1    living well with diabetes book MISC 1 each by Does not apply route once.      CONTINUE these medications which have NOT CHANGED   Details  fenofibrate 160 MG tablet Take 160 mg by mouth daily.    furosemide (LASIX) 40 MG tablet Take 40 mg by mouth daily.    PARoxetine (PAXIL) 10 MG tablet Take 10 mg by mouth every morning.    valsartan-hydrochlorothiazide (DIOVAN-HCT) 320-25 MG per tablet Take 1 tablet by mouth daily.    Travoprost, BAK Free, (TRAVATAN) 0.004 % SOLN ophthalmic solution Place 1 drop into the left eye at bedtime.      STOP taking these medications     amLODipine-benazepril (LOTREL) 10-20 MG per capsule      sitaGLIPtin (JANUVIA) 100 MG tablet         Disposition and Follow-up:  Patient d/c in stable and improved condition. Advised to follow a low carbohydrates diet, low sodium diet; heart healthy diet; to stop smoking and to be compliant with  discharge instructions and medications. He will follow with PCP over the next 5-7 days. Important to follow on this patient is BMET for renal function; medication compliance and adjustments on chronic medication regimen. Also needs follow up of incidental lung nodule in CXR/CT in 6 months with another CT of chest to r/o growing or malignancy behavior.  Consults:  Inpatient diabetes coordinator   Significant Diagnostic Studies:  Dg Chest 2 View  08/29/2011  *RADIOLOGY REPORT*  Clinical Data: Cough, weight loss and headache; history smoking and diabetes.  CHEST - 2 VIEW  Comparison: Chest radiograph performed 07/23/2007  Findings: The lungs are well-aerated.  There is a 2.9 cm rounded opacity at the peripheral left lung base.  This region was not well characterized on multiple prior studies due to effusions; malignancy cannot be excluded.  There is no evidence of pleural effusion or pneumothorax.  The heart is normal in size; the mediastinal contour is within normal limits.  No acute osseous abnormalities are seen.  Density overlying the mediastinum on the lateral view likely reflects epicardial fat.  IMPRESSION: 2.9 cm rounded opacity at the peripheral left lung base. Malignancy cannot be excluded.  CT of the chest would be helpful for further evaluation, when and as deemed clinically appropriate.  Original Report Authenticated By: Tonia Ghent, M.D.   Ct Chest Wo Contrast  08/30/2011  *RADIOLOGY REPORT*  Clinical Data: Lung nodule, weakness  CT CHEST WITHOUT CONTRAST  Technique:  Multidetector CT imaging of the chest was performed following the standard protocol without IV contrast.  Comparison: Chest radiograph 06/28/2012  Findings: Nodular thickening at the left lung base described on comparison plain film corresponds to pleural thickening along the left lateral pleural space.  There is a 6 mm pulmonary nodule in the inferior left upper lobe (image 36).  Mild  central lobular emphysema at the lung  apices.  No axillary or supraclavicular lymphadenopathy.  There are small paratracheal lymph nodes which are calcified measuring up to 10 mm. No pericardial fluid.  Esophagus is normal.  Limited view of the upper abdomen is unremarkable.  Limited view of the skeleton is unremarkable.  IMPRESSION:  1. A 6 mm pulmonary nodule in the inferior left upper lobe. If the patient is at high risk for bronchogenic carcinoma, follow-up chest CT at 6-12 months is recommended.  If the patient is at low risk for bronchogenic carcinoma, follow-up chest CT at 12 months is recommended.  This recommendation follows the consensus statement: Guidelines for Management of Small Pulmonary Nodules Detected on CT Scans: A Statement from the Fleischner Society as published in Radiology 2005; 237:395-400. Online at: DietDisorder.cz.  2.  Lesion on the comparison chest radiograph correlates to pleural thickening which appears benign. 3.  Borderline mediastinal adenopathy is likely reactive and related to emphysema.  Original Report Authenticated By: Genevive Bi, M.D.    Brief H and P: For complete details please refer to admission H and P, but in brief 78 y/o male with pmh of uncontrolled DM, medication non-compliance, tobacco abuse and hypertension; who came into the hospital secondary to general malaise, decrease appetite; also with findings of dehydration, CBG's up to 600, ARF and pseudohyponatremia. Admitted for stabilization and further evaluation and treatment.    Hospital Course:  1- Hyperglycemia and uncontrolled DM: due to medication no-compliance and diet indiscretion. Counseling provided; due to elevated A1C and ARF, his Januvia has been stopped and patient started on insulin and glipizide. He will follow with PCP for further evaluation and treatment and HHRN has been arranged to guarantee medication compliance.  2-ARF (acute renal failure): Pre-renal in origin; Cr 1.5 at  discharge; will hold on benazepril and Januvia. Will also ask patient to be hydrated and he will follow with PCP over the next 5-7 days.   3-Dehydration: Resolved after IVF's. At this point eating and drinking properly.  4-Hyponatremia: due to hyperglycemia; resolved at discharge.  5-Lung nodule: will require follow up CT in 6 months. Unable to R/O malignancy at this point.  6-Tobacco abuse: cessation provided and patient is looking to quit smoking.  7-Anxiety: Continue Paxil.  8-Dyslipidemia: continue fenofibrate.  Time spent on Discharge: 50 minutes  Signed: Marchia Diguglielmo 09/01/2011, 11:51 AM

## 2011-09-01 NOTE — Plan of Care (Signed)
Problem: Discharge Progression Outcomes Goal: Other Discharge Outcomes/Goals Outcome: Progressing Education was giving to pt. And his son,both verbalized their understanding of the diet, how to check blood sugar, and how to give injections,the difference between Lantuss and novalog

## 2011-09-02 NOTE — ED Provider Notes (Signed)
I saw and evaluated the patient, reviewed the resident's note and I agree with the findings and plan.  Hurman Horn, MD 09/02/11 1240

## 2012-04-23 ENCOUNTER — Other Ambulatory Visit: Payer: Self-pay | Admitting: Ophthalmology

## 2012-04-23 NOTE — H&P (Signed)
Pre-operative History and Physical for Ophthalmic Surgery  [name] [name] 04/23/2012                  Chief Complaint: Decreased vision right eye  Diagnosis: Nuclear Sclerotic Cataract  No Known Allergies   Prior to Admission medications   Medication Sig Start Date End Date Taking? Authorizing Provider  amLODipine (NORVASC) 5 MG tablet Take 1 tablet (5 mg total) by mouth daily. 09/01/11 08/31/12  Vassie Loll, MD  fenofibrate 160 MG tablet Take 160 mg by mouth daily.    Historical Provider, MD  Flexpen Starter Kit MISC 1 kit by Other route once. 09/01/11   Vassie Loll, MD  furosemide (LASIX) 40 MG tablet Take 40 mg by mouth daily.    Historical Provider, MD  glipiZIDE (GLUCOTROL) 5 MG tablet Take 1 tablet (5 mg total) by mouth 2 (two) times daily before a meal. 09/01/11 08/31/12  Vassie Loll, MD  insulin glargine (LANTUS SOLOSTAR) 100 UNIT/ML injection Inject 30 Units into the skin at bedtime. 09/01/11 08/31/12  Vassie Loll, MD  Insulin Pen Needle (PEN NEEDLES 5/16") 30G X 8 MM MISC 1 pen by Does not apply route at bedtime. 09/01/11   Vassie Loll, MD  living well with diabetes book MISC 1 each by Does not apply route once. 09/01/11   Vassie Loll, MD  PARoxetine (PAXIL) 10 MG tablet Take 10 mg by mouth every morning.    Historical Provider, MD  Travoprost, BAK Free, (TRAVATAN) 0.004 % SOLN ophthalmic solution Place 1 drop into the left eye at bedtime.    Historical Provider, MD  valsartan-hydrochlorothiazide (DIOVAN-HCT) 320-25 MG per tablet Take 1 tablet by mouth daily.    Historical Provider, MD    Planned Procedure:                                       Phacoemulsification, Posterior Chamber Intra-ocular Lens  Right Eye                                      Acrysof MA50BM  + 18.50 Diopter PC IOL for implant  There were no vitals filed for this visit.  Pulse: 74         Temp: NE        Resp:  16  ROS: non-contributory  Past Medical History  Diagnosis Date  . Diabetes mellitus    . Hypertension     No past surgical history on file.   History   Social History  . Marital Status: Legally Separated    Spouse Name: N/A    Number of Children: N/A  . Years of Education: N/A   Occupational History  . Not on file.   Social History Main Topics  . Smoking status: Current Some Day Smoker -- 0.5 packs/day  . Smokeless tobacco: Not on file  . Alcohol Use: No  . Drug Use: No  . Sexually Active: No   Other Topics Concern  . Not on file   Social History Narrative  . No narrative on file     The following examination is for anesthesia clearance for minimally invasive Ophthalmic surgery. It is primarily to document heart and lung findings and is not intended to elucidate unknown general medical conditions inclusive of abdominal masses, lung lesions, etc.   General Constitution:  within normal limits    Alertness/Orientation:  Person, time place     yes   HEENT:  Eye Findings: Nuclear Sclerotic Cataract                   right eye  Neck: supple without masses  Chest/Lungs: clear to auscultation  Cardiac: Normal S1 and S2 without Murmur, S3 or S4  Neuro: non-focal  Impression: Nuclear Sclerotic Cataract OD  Planned Procedure:  Phacoemulsification, Posterior Chamber Intraocular Lens OD    Shade Flood, MD

## 2012-05-09 ENCOUNTER — Encounter (HOSPITAL_COMMUNITY): Payer: Self-pay | Admitting: Respiratory Therapy

## 2012-05-11 ENCOUNTER — Encounter (HOSPITAL_COMMUNITY)
Admission: RE | Admit: 2012-05-11 | Discharge: 2012-05-11 | Payer: Medicare Other | Source: Ambulatory Visit | Attending: Ophthalmology | Admitting: Ophthalmology

## 2012-05-14 ENCOUNTER — Encounter (HOSPITAL_COMMUNITY)
Admission: RE | Admit: 2012-05-14 | Discharge: 2012-05-14 | Disposition: A | Discharge status: Home / Self Care | Payer: Medicare Other | Source: Ambulatory Visit | Attending: Ophthalmology | Admitting: Ophthalmology

## 2012-05-14 ENCOUNTER — Encounter (HOSPITAL_COMMUNITY): Payer: Self-pay

## 2012-05-14 DIAGNOSIS — N179 Acute kidney failure, unspecified: Secondary | ICD-10-CM | POA: Insufficient documentation

## 2012-05-14 DIAGNOSIS — R911 Solitary pulmonary nodule: Secondary | ICD-10-CM | POA: Insufficient documentation

## 2012-05-14 DIAGNOSIS — E860 Dehydration: Secondary | ICD-10-CM | POA: Insufficient documentation

## 2012-05-14 DIAGNOSIS — E871 Hypo-osmolality and hyponatremia: Secondary | ICD-10-CM | POA: Insufficient documentation

## 2012-05-14 DIAGNOSIS — I10 Essential (primary) hypertension: Secondary | ICD-10-CM | POA: Insufficient documentation

## 2012-05-14 DIAGNOSIS — E119 Type 2 diabetes mellitus without complications: Secondary | ICD-10-CM | POA: Insufficient documentation

## 2012-05-14 HISTORY — DX: Unspecified osteoarthritis, unspecified site: M19.90

## 2012-05-14 LAB — CBC
MCH: 32.2 pg (ref 26.0–34.0)
MCV: 89.4 fL (ref 78.0–100.0)
Platelets: 421 10*3/uL — ABNORMAL HIGH (ref 150–400)
RDW: 11.9 % (ref 11.5–15.5)

## 2012-05-14 LAB — BASIC METABOLIC PANEL
Calcium: 9.6 mg/dL (ref 8.4–10.5)
Creatinine, Ser: 2.07 mg/dL — ABNORMAL HIGH (ref 0.50–1.35)
GFR calc Af Amer: 33 mL/min — ABNORMAL LOW (ref >90)
GFR calc non Af Amer: 29 mL/min — ABNORMAL LOW (ref >90)
Sodium: 125 mEq/L — ABNORMAL LOW (ref 135–145)

## 2012-05-14 NOTE — Progress Notes (Signed)
Primary Physician - Dr. Andi Devon Does not have a cardiologist - No cardiac work-up EKG - from January 2013 - in epic

## 2012-05-14 NOTE — Progress Notes (Signed)
Chart put in basket for [name] [name] to review labs

## 2012-05-14 NOTE — Pre-Procedure Instructions (Signed)
20 [name] [name]  05/14/2012   Your procedure is scheduled on:  Wednesday, October 2nd  Report to [facility] at 1000 AM.  Call this number if you have problems the morning of surgery: [phone]   Remember:   Do not eat food:After Midnight.  Take these medicines the morning of surgery with A SIP OF WATER: Norvasc, paxil   Do not wear jewelry, make-up or nail polish.  Do not wear lotions, powders, or perfumes.   Do not shave 48 hours prior to surgery. Men may shave face and neck.  Do not bring valuables to the hospital.  Contacts, dentures or bridgework may not be worn into surgery.  Leave suitcase in the car. After surgery it may be brought to your room.  For patients admitted to the hospital, checkout time is 11:00 AM the day of discharge.   Patients discharged the day of surgery will not be allowed to drive home.   Special Instructions: Shower using CHG 2 nights before surgery and the night before surgery.  If you shower the day of surgery use CHG.  Use special wash - you have one bottle of CHG for all showers.  You should use approximately 1/3 of the bottle for each shower.   Please read over the following fact sheets that you were given: Pain Booklet, Coughing and Deep Breathing and Surgical Site Infection Prevention

## 2012-05-15 ENCOUNTER — Encounter (HOSPITAL_COMMUNITY): Payer: Self-pay | Admitting: Vascular Surgery

## 2012-05-15 NOTE — Consult Note (Signed)
Anesthesia chart review: Patient is a 78 year old male scheduled for right eye cataract extraction by Dr. Clarisa Kindred on 05/16/12. History includes former smoker, obesity, hypertension, diabetes mellitus type 2, chronic DOE, CHF '05, arthritis, prior leg surgery due to trauma.  He was found to have a LUL lung nodule by CXR/CT 08/2011 (6 month f/u recommended) during a hospitalization for uncontrolled diabetes (glucose 905), acute renal failure (BUN/Cr peak 64/2.56), dehydration, hyponatremia (down to 118) felt due to hyperglycemia.  PCP is Dr. Andi Devon.  EKG on 08/29/11 showed SR, LVH, he had a new Q wave in Lead III, but no significant Q waves in the other inferior leads.    Echo on 07/23/04 showed: - This study was inadequate for the evaluation of left ventricular regional wall motion. - Aortic valve thickness was mildly increased. There was mildly reduced aortic valve leaflet excursion. There was mild aortic valvular regurgitation. - The left atrium was dilated. - Technically limited study. I think there is hypokinesis of the inferior and posterior walls. I suspect there is moderate overall LV dysfunction. IMPRESSIONS - Technically limited study. I think there is hypokinesis of the inferior and posterior walls. I suspect there is moderate overall LV dysfunction.  CXR on 08/29/11 showed: 2.9 cm rounded opacity at the peripheral left lung base.  Malignancy cannot be excluded. CT of the chest would be helpful for further evaluation, when and as deemed clinically appropriate.   Chest CT on 08/30/11 showed: 1. A 6 mm pulmonary nodule in the inferior left upper lobe. If the patient is at high risk for bronchogenic carcinoma, follow-up chest CT at 6-12 months is recommended. If the patient is at low risk for bronchogenic carcinoma, follow-up chest CT at 12 months is recommended.   2. Lesion on the comparison chest radiograph correlates to pleural thickening which appears benign.  3. Borderline  mediastinal adenopathy is likely reactive and  related to emphysema.   Labs noted.  Na 125 (down from 129-131 in 08/2011), BUN/Cr 38/2.07 (up from 28/1.54 on 09/01/11).  Non-fasting glucose is 453.  Medications are listed as Paxil, Norvasc, fenofibrate, Lasix, glipizide, Lantus, Diovan HCT.  I called and spoke with [name] [name].  He denies chest pain, LE edema, SOB at rest, hemoptysis.  He is not particularly active, but can do ADLs without significant CV symptoms.  He is not followed by a Cardiologist and has not seen Dr. Renae Gloss since his hospitalization in January 2013.  He has seen another doctor (he couldn't give me his name or specialty), but says that doctor referred him back to continue follow-up with Dr. Renae Gloss.  He reports that he did not take him DM medications yesterday and usually his CBG is ~ 150.    I reviewed above with Anesthesiologist Dr. Ivin Booty.  He feels this procedure should be postponed until he can have further medical evaluation for his chronic medical conditions and persistent hyponatremia and also cardiology follow-up for history of CHF with abnormal echo in 2005.  I notified [name] [name] and Dr. Clarisa Kindred.  I left a message with Dr. Mathews Robinsons nurse Asher Muir to request they schedule medical and cardiology follow-up.  She can also discuss timing of follow-up chest CT at that time.  Shonna Chock, PA-C 05/15/12 1147   (Update 05/15/12 1710:  I spoke with Dr. Renae Gloss and discussed recent lab results, 08/2011 chest CT scan findings, and request for future cardiology evaluation.  Her office will call the patient to arrange.)

## 2012-05-16 ENCOUNTER — Ambulatory Visit (HOSPITAL_COMMUNITY): Admission: RE | Admit: 2012-05-16 | Payer: Medicare Other | Source: Ambulatory Visit | Admitting: Ophthalmology

## 2012-05-16 ENCOUNTER — Encounter (HOSPITAL_COMMUNITY): Admission: RE | Payer: Self-pay | Source: Ambulatory Visit

## 2012-05-16 SURGERY — PHACOEMULSIFICATION, CATARACT, WITH IOL INSERTION
Anesthesia: Monitor Anesthesia Care | Laterality: Right

## 2012-06-03 ENCOUNTER — Emergency Department (HOSPITAL_COMMUNITY)
Admission: EM | Admit: 2012-06-03 | Discharge: 2012-06-04 | Disposition: A | Discharge status: Home / Self Care | Payer: Medicare Other | Attending: Emergency Medicine | Admitting: Emergency Medicine

## 2012-06-03 ENCOUNTER — Encounter (HOSPITAL_COMMUNITY): Payer: Self-pay | Admitting: Emergency Medicine

## 2012-06-03 DIAGNOSIS — I10 Essential (primary) hypertension: Secondary | ICD-10-CM | POA: Insufficient documentation

## 2012-06-03 DIAGNOSIS — Z79899 Other long term (current) drug therapy: Secondary | ICD-10-CM | POA: Insufficient documentation

## 2012-06-03 DIAGNOSIS — R739 Hyperglycemia, unspecified: Secondary | ICD-10-CM

## 2012-06-03 DIAGNOSIS — E119 Type 2 diabetes mellitus without complications: Secondary | ICD-10-CM | POA: Insufficient documentation

## 2012-06-03 LAB — CBC WITH DIFFERENTIAL/PLATELET
Basophils Absolute: 0.1 10*3/uL (ref 0.0–0.1)
Eosinophils Relative: 10 % — ABNORMAL HIGH (ref 0–5)
Lymphocytes Relative: 30 % (ref 12–46)
MCV: 89.5 fL (ref 78.0–100.0)
Neutro Abs: 5.5 10*3/uL (ref 1.7–7.7)
Neutrophils Relative %: 54 % (ref 43–77)
Platelets: 400 10*3/uL (ref 150–400)
RBC: 3.7 MIL/uL — ABNORMAL LOW (ref 4.22–5.81)
RDW: 11.6 % (ref 11.5–15.5)
WBC: 10.2 10*3/uL (ref 4.0–10.5)

## 2012-06-03 LAB — BASIC METABOLIC PANEL
CO2: 28 mEq/L (ref 19–32)
Calcium: 9.6 mg/dL (ref 8.4–10.5)
Potassium: 4 mEq/L (ref 3.5–5.1)
Sodium: 124 mEq/L — ABNORMAL LOW (ref 135–145)

## 2012-06-03 LAB — GLUCOSE, CAPILLARY
Glucose-Capillary: 599 mg/dL (ref 70–99)
Glucose-Capillary: 420 mg/dL — ABNORMAL HIGH (ref 70–99)
Glucose-Capillary: 331 mg/dL — ABNORMAL HIGH (ref 70–99)

## 2012-06-03 MED ORDER — POTASSIUM CHLORIDE CRYS ER 20 MEQ PO TBCR
40.0000 meq | EXTENDED_RELEASE_TABLET | Freq: Once | ORAL | Status: AC
  Administered 2012-06-03: 40 meq via ORAL
  Filled 2012-06-03: qty 2

## 2012-06-03 MED ORDER — SODIUM CHLORIDE 0.9 % IV SOLN
INTRAVENOUS | Status: DC
  Administered 2012-06-03: 5.4 [IU]/h via INTRAVENOUS
  Filled 2012-06-03 (×2): qty 1

## 2012-06-03 NOTE — ED Notes (Signed)
CBG was 599

## 2012-06-03 NOTE — ED Notes (Signed)
Triage made aware of pts glucose result

## 2012-06-03 NOTE — ED Notes (Signed)
Family reports pt blood sugar was 552 around 2:45 today. Also reports pt blood sugar was 1000 on Tuesday.

## 2012-06-03 NOTE — ED Notes (Signed)
Pt's daughter in law reports pt has been having high bld sugars( into the 1000s on Tuesday, and in 500 today) for a couple of days down and has refused to get help but she forced him to come in today. Pt has been slightly confused the past 2 days.

## 2012-06-03 NOTE — ED Provider Notes (Addendum)
History     CSN: [id number]  Arrival date & time 06/03/12  1527   First MD Initiated Contact with Patient 06/03/12 1737      Chief Complaint  Patient presents with  . Hypertension    (Consider location/radiation/quality/duration/timing/severity/associated sxs/prior treatment) The history is provided by the patient and a relative.   78 year old, male, with non-insulin-dependent diabetes.  Presents to emergency department complaining of elevated blood sugar.  He states that his son checked his blood sugar today.  For no apparent reason, and noted that it was high.  Therefore, his son told him to come to the emergency room for evaluation.  The patient denies pain anywhere.  He denies nausea, vomiting, polyuria, polydipsia.  He denies fevers, chills, cough, or shortness of breath.  He denies vision changes.  He denies recent change in his medications.  He denies recent illnesses.  Presently, he is completely asymptomatic.  Past Medical History  Diagnosis Date  . Hypertension   . Shortness of breath     with exertion  . Diabetes mellitus     does not check his blood sugar at home  . Arthritis     Past Surgical History  Procedure Date  . Leg surgery     both legs - unsure of exact operations - from trauma  . Tonsillectomy     No family history on file.  History  Substance Use Topics  . Smoking status: Former Smoker -- 0.5 packs/day    Quit date: 05/14/2009  . Smokeless tobacco: Not on file  . Alcohol Use: No      Review of Systems  Constitutional: Negative for fever, chills and diaphoresis.  HENT: Negative for neck pain.   Eyes: Negative for visual disturbance.  Respiratory: Negative for cough and shortness of breath.   Cardiovascular: Negative for chest pain.  Gastrointestinal: Negative for nausea, vomiting, abdominal pain and diarrhea.  Genitourinary: Negative for frequency.  Musculoskeletal: Negative for back pain.  Neurological: Negative for light-headedness and  headaches.  Psychiatric/Behavioral: Negative for confusion.  All other systems reviewed and are negative.    Allergies  Review of patient's allergies indicates no known allergies.  Home Medications   Current Outpatient Rx  Name Route Sig Dispense Refill  . AMLODIPINE BESYLATE 5 MG PO TABS Oral Take 1 tablet (5 mg total) by mouth daily. 30 tablet 1  . FENOFIBRATE 160 MG PO TABS Oral Take 160 mg by mouth daily.    . FUROSEMIDE 40 MG PO TABS Oral Take 40 mg by mouth daily.    Marland Kitchen GLIPIZIDE 5 MG PO TABS Oral Take 1 tablet (5 mg total) by mouth 2 (two) times daily before a meal. 60 tablet 1  . INSULIN GLARGINE 100 UNIT/ML Metaline SOLN Subcutaneous Inject 30 Units into the skin at bedtime. 3 mL 2  . PAROXETINE HCL 10 MG PO TABS Oral Take 10 mg by mouth every morning.    . TRAVOPROST (BAK FREE) 0.004 % OP SOLN Left Eye Place 1 drop into the left eye at bedtime.    Marland Kitchen VALSARTAN-HYDROCHLOROTHIAZIDE 320-25 MG PO TABS Oral Take 1 tablet by mouth daily.      BP 134/56  Pulse 88  Temp 98.3 F (36.8 C) (Oral)  Resp 18  SpO2 99%  Physical Exam  Nursing note and vitals reviewed. Constitutional: He is oriented to person, place, and time. He appears well-developed and well-nourished. No distress.  HENT:  Head: Normocephalic and atraumatic.  Eyes: Conjunctivae normal and EOM are normal.  Neck: Normal range of motion. Neck supple.  Cardiovascular: Normal rate, regular rhythm and intact distal pulses.   No murmur heard. Pulmonary/Chest: Effort normal and breath sounds normal. No respiratory distress. He has no rales.  Abdominal: Soft. Bowel sounds are normal. He exhibits no distension. There is no tenderness.  Musculoskeletal: Normal range of motion. He exhibits no edema and no tenderness.  Neurological: He is alert and oriented to person, place, and time. No cranial nerve deficit.  Skin: Skin is warm and dry.  Psychiatric: He has a normal mood and affect. Thought content normal.    ED Course    Procedures (including critical care time) hyperglycemia in an asymptomatic male, with known history of non-insulin-dependent diabetes.  We will treat him with IV fluids, and IV insulin.  No perform laboratory testing, for evaluation.  I doubt he is in DKA.  There is no indication is toxic.  Labs Reviewed  CBC WITH DIFFERENTIAL - Abnormal; Notable for the following:    RBC 3.70 (*)     Hemoglobin 11.7 (*)     HCT 33.1 (*)     Eosinophils Relative 10 (*)     Eosinophils Absolute 1.0 (*)     All other components within normal limits  BASIC METABOLIC PANEL - Abnormal; Notable for the following:    Sodium 124 (*)     Chloride 84 (*)     Glucose, Bld 598 (*)     BUN 53 (*)     Creatinine, Ser 2.04 (*)     GFR calc non Af Amer 29 (*)     GFR calc Af Amer 34 (*)     All other components within normal limits  GLUCOSE, CAPILLARY - Abnormal; Notable for the following:    Glucose-Capillary 599 (*)     All other components within normal limits   No results found.   No diagnosis found.  9:11 PM Spoke with case Production designer, theatre/television/film.  She will arrange rn to come to house for teaching concerning dm.  11:46 PM asx still. BG < 200  MDM  Hyperglycemia Improved. Asx. No dka.        Cheri Guppy, MD 06/03/12 1945  Cheri Guppy, MD 06/03/12 2111  Cheri Guppy, MD 06/03/12 [phone]

## 2012-06-03 NOTE — ED Notes (Signed)
Faxed paperwork to [facility]

## 2012-06-03 NOTE — ED Notes (Signed)
Spoke with Case Manager, instructed to have order for home health RN and a face-to-face along with facesheet faxed to [facility]. Pt made aware of process and need for DM teaching. Pt provided additional information to case manager.

## 2012-06-03 NOTE — ED Notes (Signed)
Pt reports he is here d/t high bld sugar. Pt has some delayed responses.

## 2013-01-16 ENCOUNTER — Inpatient Hospital Stay (HOSPITAL_COMMUNITY): Payer: Medicare Other

## 2013-01-16 ENCOUNTER — Inpatient Hospital Stay (HOSPITAL_COMMUNITY)
Admission: EM | Admit: 2013-01-16 | Discharge: 2013-01-20 | DRG: 639 | Disposition: A | Discharge status: Home / Self Care | Payer: Medicare Other | Attending: Internal Medicine | Admitting: Internal Medicine

## 2013-01-16 ENCOUNTER — Emergency Department (HOSPITAL_COMMUNITY): Payer: Medicare Other

## 2013-01-16 ENCOUNTER — Encounter (HOSPITAL_COMMUNITY): Payer: Self-pay | Admitting: *Deleted

## 2013-01-16 DIAGNOSIS — W19XXXA Unspecified fall, initial encounter: Secondary | ICD-10-CM

## 2013-01-16 DIAGNOSIS — N179 Acute kidney failure, unspecified: Secondary | ICD-10-CM | POA: Diagnosis present

## 2013-01-16 DIAGNOSIS — E162 Hypoglycemia, unspecified: Secondary | ICD-10-CM | POA: Diagnosis present

## 2013-01-16 DIAGNOSIS — E860 Dehydration: Secondary | ICD-10-CM

## 2013-01-16 DIAGNOSIS — I129 Hypertensive chronic kidney disease with stage 1 through stage 4 chronic kidney disease, or unspecified chronic kidney disease: Secondary | ICD-10-CM | POA: Diagnosis present

## 2013-01-16 DIAGNOSIS — IMO0002 Reserved for concepts with insufficient information to code with codable children: Principal | ICD-10-CM | POA: Diagnosis present

## 2013-01-16 DIAGNOSIS — Z794 Long term (current) use of insulin: Secondary | ICD-10-CM

## 2013-01-16 DIAGNOSIS — Z79899 Other long term (current) drug therapy: Secondary | ICD-10-CM

## 2013-01-16 DIAGNOSIS — E119 Type 2 diabetes mellitus without complications: Secondary | ICD-10-CM

## 2013-01-16 DIAGNOSIS — T383X5A Adverse effect of insulin and oral hypoglycemic [antidiabetic] drugs, initial encounter: Secondary | ICD-10-CM | POA: Diagnosis present

## 2013-01-16 DIAGNOSIS — F0390 Unspecified dementia without behavioral disturbance: Secondary | ICD-10-CM | POA: Diagnosis present

## 2013-01-16 DIAGNOSIS — E876 Hypokalemia: Secondary | ICD-10-CM | POA: Diagnosis present

## 2013-01-16 DIAGNOSIS — Z87891 Personal history of nicotine dependence: Secondary | ICD-10-CM

## 2013-01-16 DIAGNOSIS — E1169 Type 2 diabetes mellitus with other specified complication: Principal | ICD-10-CM | POA: Diagnosis present

## 2013-01-16 DIAGNOSIS — N183 Chronic kidney disease, stage 3 unspecified: Secondary | ICD-10-CM | POA: Diagnosis present

## 2013-01-16 LAB — GLUCOSE, CAPILLARY
Glucose-Capillary: 127 mg/dL — ABNORMAL HIGH (ref 70–99)
Glucose-Capillary: 107 mg/dL — ABNORMAL HIGH (ref 70–99)
Glucose-Capillary: 195 mg/dL — ABNORMAL HIGH (ref 70–99)
Glucose-Capillary: 110 mg/dL — ABNORMAL HIGH (ref 70–99)

## 2013-01-16 LAB — CBC
HCT: 27 % — ABNORMAL LOW (ref 39.0–52.0)
HCT: 28.2 % — ABNORMAL LOW (ref 39.0–52.0)
Hemoglobin: 9.5 g/dL — ABNORMAL LOW (ref 13.0–17.0)
Hemoglobin: 9.8 g/dL — ABNORMAL LOW (ref 13.0–17.0)
MCV: 88.2 fL (ref 78.0–100.0)
MCV: 89.2 fL (ref 78.0–100.0)
RBC: 3.06 MIL/uL — ABNORMAL LOW (ref 4.22–5.81)
RBC: 3.16 MIL/uL — ABNORMAL LOW (ref 4.22–5.81)
RDW: 12.4 % (ref 11.5–15.5)
WBC: 13 10*3/uL — ABNORMAL HIGH (ref 4.0–10.5)
WBC: 11.8 10*3/uL — ABNORMAL HIGH (ref 4.0–10.5)

## 2013-01-16 LAB — COMPREHENSIVE METABOLIC PANEL
Albumin: 3.7 g/dL (ref 3.5–5.2)
Alkaline Phosphatase: 34 U/L — ABNORMAL LOW (ref 39–117)
BUN: 95 mg/dL — ABNORMAL HIGH (ref 6–23)
CO2: 34 mEq/L — ABNORMAL HIGH (ref 19–32)
Chloride: 85 mEq/L — ABNORMAL LOW (ref 96–112)
Creatinine, Ser: 3.93 mg/dL — ABNORMAL HIGH (ref 0.50–1.35)
GFR calc non Af Amer: 13 mL/min — ABNORMAL LOW (ref >90)
Glucose, Bld: 132 mg/dL — ABNORMAL HIGH (ref 70–99)
Potassium: 3.1 mEq/L — ABNORMAL LOW (ref 3.5–5.1)
Total Bilirubin: 0.8 mg/dL (ref 0.3–1.2)

## 2013-01-16 LAB — TROPONIN I: Troponin I: <0.3 ng/mL (ref <0.30)

## 2013-01-16 LAB — POCT I-STAT TROPONIN I

## 2013-01-16 LAB — CREATININE, SERUM: GFR calc Af Amer: 16 mL/min — ABNORMAL LOW (ref >90)

## 2013-01-16 MED ORDER — ACETAMINOPHEN 650 MG RE SUPP: 650.0000 mg | Freq: Four times a day (QID) | RECTAL | Status: DC | PRN

## 2013-01-16 MED ORDER — FENOFIBRATE 160 MG PO TABS
160.0000 mg | ORAL_TABLET | Freq: Every day | ORAL | Status: DC
  Administered 2013-01-17 – 2013-01-20 (×4): 160 mg via ORAL
  Filled 2013-01-16 (×4): qty 1

## 2013-01-16 MED ORDER — DEXTROSE-NACL 5-0.45 % IV SOLN
INTRAVENOUS | Status: DC
  Administered 2013-01-16: 18:00:00 via INTRAVENOUS

## 2013-01-16 MED ORDER — HYDROCODONE-ACETAMINOPHEN 5-325 MG PO TABS
1.0000 | ORAL_TABLET | ORAL | Status: DC | PRN
  Administered 2013-01-16: 1 via ORAL
  Administered 2013-01-18 (×2): 2 via ORAL
  Filled 2013-01-16 (×2): qty 2
  Filled 2013-01-16: qty 1

## 2013-01-16 MED ORDER — INSULIN ASPART 100 UNIT/ML ~~LOC~~ SOLN
0.0000 [IU] | Freq: Every day | SUBCUTANEOUS | Status: DC
Start: 2013-01-16 — End: 2013-01-17

## 2013-01-16 MED ORDER — ACETAMINOPHEN 325 MG PO TABS
650.0000 mg | ORAL_TABLET | Freq: Four times a day (QID) | ORAL | Status: DC | PRN
  Administered 2013-01-17: 650 mg via ORAL
  Filled 2013-01-16: qty 2

## 2013-01-16 MED ORDER — ONDANSETRON HCL 4 MG/2ML IJ SOLN: 4.0000 mg | Freq: Four times a day (QID) | INTRAMUSCULAR | Status: DC | PRN

## 2013-01-16 MED ORDER — INSULIN ASPART 100 UNIT/ML ~~LOC~~ SOLN: 0.0000 [IU] | Freq: Three times a day (TID) | SUBCUTANEOUS | Status: DC

## 2013-01-16 MED ORDER — DEXTROSE 50 % IV SOLN
INTRAVENOUS | Status: AC
  Administered 2013-01-16: 50 mL
  Filled 2013-01-16: qty 50

## 2013-01-16 MED ORDER — POTASSIUM CHLORIDE CRYS ER 20 MEQ PO TBCR
40.0000 meq | EXTENDED_RELEASE_TABLET | Freq: Once | ORAL | Status: AC
  Administered 2013-01-16: 40 meq via ORAL
  Filled 2013-01-16: qty 2

## 2013-01-16 MED ORDER — ALUM & MAG HYDROXIDE-SIMETH 200-200-20 MG/5ML PO SUSP
30.0000 mL | Freq: Four times a day (QID) | ORAL | Status: DC | PRN
  Filled 2013-01-16: qty 30

## 2013-01-16 MED ORDER — PAROXETINE HCL 10 MG PO TABS
10.0000 mg | ORAL_TABLET | ORAL | Status: DC
  Administered 2013-01-17 – 2013-01-20 (×4): 10 mg via ORAL
  Filled 2013-01-16 (×5): qty 1

## 2013-01-16 MED ORDER — DEXTROSE 50 % IV SOLN
1.0000 | Freq: Once | INTRAVENOUS | Status: AC
  Administered 2013-01-16: 50 mL via INTRAVENOUS

## 2013-01-16 MED ORDER — HEPARIN SODIUM (PORCINE) 5000 UNIT/ML IJ SOLN
5000.0000 [IU] | Freq: Three times a day (TID) | INTRAMUSCULAR | Status: DC
  Administered 2013-01-16 – 2013-01-20 (×11): 5000 [IU] via SUBCUTANEOUS
  Filled 2013-01-16 (×14): qty 1

## 2013-01-16 MED ORDER — ONDANSETRON HCL 4 MG PO TABS: 4.0000 mg | ORAL_TABLET | Freq: Four times a day (QID) | ORAL | Status: DC | PRN

## 2013-01-16 MED ORDER — HYDROMORPHONE HCL PF 1 MG/ML IJ SOLN: 1.0000 mg | INTRAMUSCULAR | Status: DC | PRN

## 2013-01-16 MED ORDER — SODIUM CHLORIDE 0.9 % IJ SOLN
3.0000 mL | Freq: Two times a day (BID) | INTRAMUSCULAR | Status: DC
  Administered 2013-01-17 – 2013-01-20 (×3): 3 mL via INTRAVENOUS

## 2013-01-16 NOTE — H&P (Signed)
History and Physical       Hospital Admission Note Date: 01/16/2013  Patient name: [name] [name] Medical record number: [id number] Date of birth: 24-Sep-1930 Age: 79 y.o. Gender: male PCP: Alva Garnet., MD    Chief Complaint:  Hypoglycemia today  HPI: Patient is a 79year old male with possible underlying dementia, poor historian, with history of diabetes mellitus, hypertension, insulin-dependent was found on the floor the last 2 mornings. Patient reported he was not feeling well and had blurry vision, he is unable to give himself insulin. Patient reports that he lives at home alone but her friend comes by to visit him who is in training to be a CNA and gives him insulin. After the insulin, patient's symptoms worsened and he felt weak, sweaty and apparently had a fall (although somewhat unclear as patient is a poor historian). Patient also complained of mild substernal chest pain which started yesterday .To the EDP however at the time of my encounter he denied any chest pain. He had a recent increase in the insulin.     Review of Systems:  Unable to obtain from the patient due to his confusion, underlying dementia, no family member at bedside   Past Medical History: Past Medical History  Diagnosis Date  . Hypertension   . Shortness of breath     with exertion  . Diabetes mellitus     does not check his blood sugar at home  . Arthritis    Past Surgical History  Procedure Laterality Date  . Leg surgery      both legs - unsure of exact operations - from trauma  . Tonsillectomy      Medications: Prior to Admission medications   Medication Sig Start Date End Date Taking? Authorizing Provider  amLODipine (NORVASC) 5 MG tablet Take 5 mg by mouth daily.   Yes Historical Provider, MD  fenofibrate 160 MG tablet Take 160 mg by mouth daily.   Yes Historical Provider, MD  furosemide (LASIX) 40 MG tablet Take 40 mg by mouth daily.    Yes Historical Provider, MD  glipiZIDE (GLUCOTROL XL) 10 MG 24 hr tablet Take 20 mg by mouth daily.   Yes Historical Provider, MD  Insulin Lispro Prot & Lispro (HUMALOG MIX 75/25 KWIKPEN) (75-25) 100 UNIT/ML SUPN Inject 15-20 Units into the skin 2 (two) times daily. Uses 15 units before breakfast and 20 units before supper   Yes Historical Provider, MD  PARoxetine (PAXIL) 10 MG tablet Take 10 mg by mouth every morning.   Yes Historical Provider, MD  valsartan-hydrochlorothiazide (DIOVAN-HCT) 320-25 MG per tablet Take 1 tablet by mouth daily.   Yes Historical Provider, MD    Allergies:  No Known Allergies  Social History:  reports that he quit smoking about 3 years ago. He does not have any smokeless tobacco history on file. He reports that he does not drink alcohol or use illicit drugs.  Family History: No family history on file.  Physical Exam: Blood pressure 120/45, pulse 78, temperature 97.7 F (36.5 C), temperature source Oral, resp. rate 15, SpO2 99.00%. General: Alert, awake, oriented  to self, in no acute distress. HEENT: normocephalic, atraumatic, anicteric sclera, pink conjunctiva, pupils equal and reactive to light and accomodation, oropharynx clear Neck: supple, no masses or lymphadenopathy, no goiter, no bruits  Heart: Regular rate and rhythm, without murmurs, rubs or gallops. Lungs: Clear to auscultation bilaterally, no wheezing, rales or rhonchi. Abdomen: Soft, nontender, nondistended, positive bowel sounds, no masses. Extremities: No clubbing, cyanosis or  edema with positive pedal pulses. Neuro: Grossly intact, no focal neurological deficits, strength 5/5 upper and lower extremities bilaterally Psych: alert and oriented x 3, normal mood and affect Skin: no rashes or lesions, warm and dry   LABS on Admission:  Basic Metabolic Panel:  Recent Labs Lab 01/16/13 1212  NA 132*  K 3.1*  CL 85*  CO2 34*  GLUCOSE 132*  BUN 95*  CREATININE 3.93*  CALCIUM 9.5   Liver  Function Tests:  Recent Labs Lab 01/16/13 1212  AST 69*  ALT 19  ALKPHOS 34*  BILITOT 0.8  PROT 7.6  ALBUMIN 3.7   No results found for this basename: LIPASE, AMYLASE,  in the last 168 hours No results found for this basename: AMMONIA,  in the last 168 hours CBC:  Recent Labs Lab 01/16/13 1212  WBC 13.0*  HGB 9.5*  HCT 27.0*  MCV 88.2  PLT 398   Cardiac Enzymes:  Recent Labs Lab 01/16/13 1416  TROPONINI <0.30   BNP: No components found with this basename: POCBNP,  CBG:  Recent Labs Lab 01/16/13 1104 01/16/13 1213  GLUCAP 37* 127*     Radiological Exams on Admission: Dg Chest Port 1 View  01/16/2013   *RADIOLOGY REPORT*  Clinical Data: Chest pain.  PORTABLE CHEST - 1 VIEW  Comparison: PA and lateral chest 08/29/2011 and CT chest 08/30/2011.  Findings: Lungs are clear.  Heart size is normal.  No pneumothorax or pleural fluid.  IMPRESSION: No acute disease.   Original Report Authenticated By: Holley Dexter, M.D.    Assessment/Plan Principal Problem:   Hypoglycemia: Patient is a insulin-dependent diabetic and appears to have poor diabetic education, underlying dementia. He now has acute renal insufficiency with creatinine of 3.9 which likely is also causing prolonged insulin affect, also on oral hypoglycemics. Patient has no insight into his medications. It is unclear if he or his friend checks the blood glucose prior to giving him the insulin. - Will admit and place on gentle D5 drip, sliding scale insulin for now - Diabetic coordinator consult to follow along, carb modified diet  Active Problems:   Dehydration: Continue gentle hydration    Hypokalemia: Was replaced in ED    Acute renal failure - Hold Diovan, HCTZ, Lasix - Continue gentle hydration - Obtain renal ultrasound for further workup    Fall - Obtain PT evaluation . Given his medical issues and no safe supervision for his medications, may need to upgrade to assisted living facility if he meets  criteria   DVT prophylaxis:  Lovenox   CODE STATUS:  Full code   Further plan will depend as patient's clinical course evolves and further radiologic and laboratory data become available.   Time Spent on Admission: 1 hour  Kenji Mapel M.D. [facility] 01/16/2013, 3:40 PM Pager: [phone]  If 7PM-7AM, please contact night-coverage [email or web address] Password [email or web address]

## 2013-01-16 NOTE — ED Notes (Signed)
CBG 127 reassesssed

## 2013-01-16 NOTE — ED Notes (Signed)
Spoke with radiology Re: Port Chest X ray results

## 2013-01-16 NOTE — ED Notes (Signed)
According to EMS, patient comes from home.  Very unkempt conditions.  The family tried to feed him breakfast this morning and he wouldn't eat they also tried to give him what appeared like Aspirins which were still stuck to the roof of his mouth.  Family could not say what pills they were.  EMS checked his blood sugar and it was 36 so they gave him some 24gm of glucose by mouth and he seemed to be more alert, followed commands.  They checked his sugar again and it was forty.  He did indicate to EMS he was having central "pressure".  Patient denies radiation and EMS could not ascertain if he was SOB, diaphoretic, or dizziness.  His initial GCS was 13.  Now it is a 15.   EMS did administer one SL nitro.

## 2013-01-16 NOTE — ED Provider Notes (Signed)
History     CSN: [id number] Arrival date & time 01/16/13  1106 First MD Initiated Contact with Patient 01/16/13 1154     Chief complaint: hypoglycemia, chest pain  HPI   Level 5 caveat applies due to likely underlying dementia and the fact that patient is a poor historian. Per family, this is how patient always is (forgets things, changes story frequently) and that he is at fact at baseline from mental status perspective.   Patient was with a close friend at home who helps him with medicine each morning. Friend reported a CBG of 128 before administering 20 units of Humalog 75/25. Patient states that he found himself in the morning on the ground the last 2 mornings. Patient did seem to have hypoglycemia symptoms before insulin this morning as he states that he was not feeling well and had blurry vision. After insulin, patient's symptoms worsened and states he could "not see a thing" and that he felt weak and sweaty. This is not a 1x issue today, patient with a history of hypoglycemia over the last week at least and it sounds like he has been having issues for at least a month. On 5/29, he was seen by [facility] endocrinology. Per patient and family report, had cbg of 38 in office and he was given treatment and it came up to varying reports of 68-100. Patient left office and went to [employer] and by the time he came home he passed out and EMS came out to the home. They treated him on scene and apparently he came back to but then refused to go to the hospital. I was able to review records from that visit which showed patient had his insulin increased from 15 to 20 units in the AM and 20 units to 24 units in the PM.  He no longer takes Lantus which is listed in med rec. Patient lives at home alone with exception of the 1 hour his friend comes by each day. She is in training to be a CNA but has no current credentials. She has a poor understanding of diabetes and treatment for hypoglycemia. Patient is not able to state  how he would treat himself if he felt like his blood sugar was low and states he does not check his blood sugar because he does not want to know what it is. From history, very unclear whether his friend actually checks his CBG each morning before giving insulin.   Patient also complains of mild sternal chest pain. He states that this started yesterday at one point then states it has been intermittent at one point and later states it just started today. Patient denies shortness of breath, nausea, current diaphoresis (though was sweaty earlier today with hypoglycemia). Patient still making urine but cannot quantify amount or if up or down. Without intervention before patient left ED, pain resolved completely.   Past Medical History  Diagnosis Date  . Hypertension   . Shortness of breath     with exertion  . Diabetes mellitus     does not check his blood sugar at home  . Arthritis     Past Surgical History  Procedure Laterality Date  . Leg surgery      both legs - unsure of exact operations - from trauma  . Tonsillectomy      No family history on file.  History  Substance Use Topics  . Smoking status: Former Smoker -- 0.50 packs/day    Quit date: 05/14/2009  . Smokeless  tobacco: Not on file  . Alcohol Use: No    Review of Systems A full 10 point review of symptoms was performed and was negative except as noted in HPI.    Allergies  Review of patient's allergies indicates no known allergies.  Home Medications   Current Outpatient Rx  Name  Route  Sig  Dispense  Refill  . EXPIRED: amLODipine (NORVASC) 5 MG tablet   Oral   Take 1 tablet (5 mg total) by mouth daily.   30 tablet   1   . fenofibrate 160 MG tablet   Oral   Take 160 mg by mouth daily.         . furosemide (LASIX) 40 MG tablet   Oral   Take 40 mg by mouth daily.         Marland Kitchen EXPIRED: glipiZIDE (GLUCOTROL) 5 MG tablet   Oral   Take 1 tablet (5 mg total) by mouth 2 (two) times daily before a meal.   60  tablet   1   . EXPIRED: insulin glargine (LANTUS SOLOSTAR) 100 UNIT/ML injection   Subcutaneous   Inject 30 Units into the skin at bedtime.   3 mL   2   . PARoxetine (PAXIL) 10 MG tablet   Oral   Take 10 mg by mouth every morning.         . Travoprost, BAK Free, (TRAVATAN) 0.004 % SOLN ophthalmic solution   Left Eye   Place 1 drop into the left eye at bedtime.         . valsartan-hydrochlorothiazide (DIOVAN-HCT) 320-25 MG per tablet   Oral   Take 1 tablet by mouth daily.           BP 120/45  Pulse 78  Temp(Src) 97.7 F (36.5 C) (Oral)  Resp 15  SpO2 99%  Physical Exam  Constitutional: He appears well-developed and well-nourished. No distress.  HENT:  Head: Normocephalic and atraumatic.  Mouth/Throat: No oropharyngeal exudate.  Eyes: EOM are normal. Pupils are equal, round, and reactive to light.  Neck: Normal range of motion. Neck supple.  Cardiovascular: Normal rate and regular rhythm.  Exam reveals no gallop and no friction rub.   No murmur heard. Pulmonary/Chest: Effort normal and breath sounds normal. He exhibits tenderness (along left border of sternum).  Abdominal: Soft. Bowel sounds are normal. There is no tenderness. There is no rebound and no guarding.  Musculoskeletal: Normal range of motion. He exhibits no edema.  Neurological: He is alert. He displays normal reflexes. No cranial nerve deficit. He exhibits normal muscle tone.  5/5 strength in upper and lower extremities. Patient oriented to person, place, and reason for visit. Cannot give any information on time except for that it is summer. 3 word recall immediate 3/3 but in 2 minutes 0/3 and even in 15 seconds when repeated later.     EKG-HR 83, normal sinus rhythm. Left axis deviation. Q wave in III unchanged from previous and not in conitguous leads. T wave flattening in 1 and avl unchanged from previous. Left atrial enlargement noted but similar to previous. EKG compared to August 29 2011.  ED  Course  Procedures (including critical care time)  Labs Reviewed  GLUCOSE, CAPILLARY - Abnormal; Notable for the following:    Glucose-Capillary 37 (*)    All other components within normal limits  CBC - Abnormal; Notable for the following:    WBC 13.0 (*)    RBC 3.06 (*)    Hemoglobin 9.5 (*)  HCT 27.0 (*)    All other components within normal limits  COMPREHENSIVE METABOLIC PANEL - Abnormal; Notable for the following:    Sodium 132 (*)    Potassium 3.1 (*)    Chloride 85 (*)    CO2 34 (*)    Glucose, Bld 132 (*)    BUN 95 (*)    Creatinine, Ser 3.93 (*)    AST 69 (*)    Alkaline Phosphatase 34 (*)    GFR calc non Af Amer 13 (*)    GFR calc Af Amer 15 (*)    All other components within normal limits  GLUCOSE, CAPILLARY - Abnormal; Notable for the following:    Glucose-Capillary 127 (*)    All other components within normal limits  TROPONIN I  POCT I-STAT TROPONIN I   Dg Chest Port 1 View  01/16/2013   *RADIOLOGY REPORT*  Clinical Data: Chest pain.  PORTABLE CHEST - 1 VIEW  Comparison: PA and lateral chest 08/29/2011 and CT chest 08/30/2011.  Findings: Lungs are clear.  Heart size is normal.  No pneumothorax or pleural fluid.  IMPRESSION: No acute disease.   Original Report Authenticated By: Holley Dexter, M.D.   1. ARF (acute renal failure)   2. Hypoglycemia   3. Diabetes mellitus, type II, insulin dependent    MDM  79 year old male with insulin dependent diabetes with poor diabetes education and likely underlying dementia presenting with hypoglycemia (likely related to insulin stacking given AKI), acute kidney, and chest pain. Patient to be admitted to [facility] for further management of the above. I have discussed with the family that patient has multiple medical issues that I do not think they can likely manage at home. I have told them that SNF is a possibility either now or within coming months/years.   Gabriel Rung (son-decision maker per family  available but no paperwork available). No phone #.  PCP Andi Devon, PCP [facility] endoocrinology  Shelva Majestic, MD 01/16/13 [phone]

## 2013-01-16 NOTE — ED Notes (Signed)
Checked patient cbg it was 75 notified RN Toniann Fail of low blood sugar

## 2013-01-16 NOTE — ED Notes (Signed)
Pts CBG rechecked 107

## 2013-01-16 NOTE — ED Notes (Signed)
Checked patient blood sugar it was 107 notified RN Toniann Fail of blood sugar

## 2013-01-16 NOTE — ED Notes (Signed)
PT more alert, shaking subsided, pt answers questions appropriately, pt given bag lunch, will reassess cbg

## 2013-01-16 NOTE — ED Notes (Signed)
Pt c/o mid CP, pt on cardiac monitor, pt in NSR,  EKG completed & showed to Tuckerman, MD

## 2013-01-16 NOTE — ED Notes (Signed)
[name], MD aware of CBG at bedside

## 2013-01-16 NOTE — Progress Notes (Signed)
Received pt. From ED.Pt. Is from home by himself.As per pt. He moves independely at home.Pt. Skin is dry,with out any pressure sore.Pt. Was place on telemetry box #4.Keep monitoring pt. Closely and assessing his needs.

## 2013-01-16 NOTE — ED Notes (Signed)
No IV access upon arrival to ED, [name], MD aware of no IV access, Pt given 1 Amp D50 upon IV Access

## 2013-01-17 DIAGNOSIS — E162 Hypoglycemia, unspecified: Secondary | ICD-10-CM

## 2013-01-17 LAB — BASIC METABOLIC PANEL
Chloride: 90 mEq/L — ABNORMAL LOW (ref 96–112)
GFR calc Af Amer: 17 mL/min — ABNORMAL LOW (ref >90)
GFR calc non Af Amer: 15 mL/min — ABNORMAL LOW (ref >90)
Potassium: 3.1 mEq/L — ABNORMAL LOW (ref 3.5–5.1)
Sodium: 131 mEq/L — ABNORMAL LOW (ref 135–145)

## 2013-01-17 LAB — GLUCOSE, CAPILLARY
Glucose-Capillary: 23 mg/dL — CL (ref 70–99)
Glucose-Capillary: 32 mg/dL — CL (ref 70–99)
Glucose-Capillary: 297 mg/dL — ABNORMAL HIGH (ref 70–99)
Glucose-Capillary: 166 mg/dL — ABNORMAL HIGH (ref 70–99)
Glucose-Capillary: 232 mg/dL — ABNORMAL HIGH (ref 70–99)

## 2013-01-17 LAB — CBC
Hemoglobin: 8.8 g/dL — ABNORMAL LOW (ref 13.0–17.0)
RBC: 2.82 MIL/uL — ABNORMAL LOW (ref 4.22–5.81)

## 2013-01-17 LAB — HEMOGLOBIN A1C: Mean Plasma Glucose: 246 mg/dL — ABNORMAL HIGH (ref <117)

## 2013-01-17 LAB — TROPONIN I: Troponin I: <0.3 ng/mL (ref <0.30)

## 2013-01-17 MED ORDER — INSULIN ASPART 100 UNIT/ML ~~LOC~~ SOLN: 0.0000 [IU] | Freq: Three times a day (TID) | SUBCUTANEOUS | Status: DC

## 2013-01-17 MED ORDER — DEXTROSE 50 % IV SOLN
INTRAVENOUS | Status: AC
  Administered 2013-01-17: 50 mL
  Filled 2013-01-17: qty 50

## 2013-01-17 MED ORDER — SODIUM CHLORIDE 0.9 % IV SOLN
INTRAVENOUS | Status: DC
  Administered 2013-01-17 – 2013-01-20 (×5): via INTRAVENOUS

## 2013-01-17 MED ORDER — DEXTROSE-NACL 5-0.45 % IV SOLN
INTRAVENOUS | Status: DC
  Administered 2013-01-17: 11:00:00 via INTRAVENOUS

## 2013-01-17 MED ORDER — INSULIN ASPART 100 UNIT/ML ~~LOC~~ SOLN
0.0000 [IU] | Freq: Three times a day (TID) | SUBCUTANEOUS | Status: DC
  Administered 2013-01-18: 3 [IU] via SUBCUTANEOUS
  Administered 2013-01-18 – 2013-01-19 (×2): 2 [IU] via SUBCUTANEOUS
  Administered 2013-01-19: 1 [IU] via SUBCUTANEOUS
  Administered 2013-01-20: 2 [IU] via SUBCUTANEOUS

## 2013-01-17 MED ORDER — GLUCAGON HCL (RDNA) 1 MG IJ SOLR
INTRAMUSCULAR | Status: AC
  Filled 2013-01-17: qty 1

## 2013-01-17 MED ORDER — SODIUM CHLORIDE 0.9 % IV BOLUS (SEPSIS)
1000.0000 mL | Freq: Once | INTRAVENOUS | Status: AC
  Administered 2013-01-17: 1000 mL via INTRAVENOUS

## 2013-01-17 MED ORDER — POTASSIUM CHLORIDE CRYS ER 20 MEQ PO TBCR
40.0000 meq | EXTENDED_RELEASE_TABLET | Freq: Four times a day (QID) | ORAL | Status: AC
  Administered 2013-01-17 (×2): 40 meq via ORAL
  Filled 2013-01-17 (×2): qty 2

## 2013-01-17 MED ORDER — GLUCAGON HCL (RDNA) 1 MG IJ SOLR
INTRAMUSCULAR | Status: AC
  Administered 2013-01-17: 1 mg
  Filled 2013-01-17: qty 1

## 2013-01-17 MED ORDER — GLUCOSE-VITAMIN C 4-6 GM-MG PO CHEW
CHEWABLE_TABLET | ORAL | Status: AC
  Administered 2013-01-17: 2
  Filled 2013-01-17: qty 2

## 2013-01-17 MED ORDER — DEXTROSE 5 % IV SOLN
INTRAVENOUS | Status: DC
  Administered 2013-01-17: 05:00:00 via INTRAVENOUS

## 2013-01-17 NOTE — Progress Notes (Addendum)
Pt has CBG of 232 with no HS coverage. [name] [name] paged. New orders placed. Pt asymptomatic and VSS at this time. Will continue to monitor. [name]

## 2013-01-17 NOTE — Progress Notes (Signed)
Per family member requests that if there are any questions or changes to patients medicines, please notify [employer] at [phone]. Pt has medicines delivered to house. Thanks!
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
## 2013-01-17 NOTE — ED Provider Notes (Signed)
I saw and evaluated the patient, reviewed the resident's note and I agree with the findings and plan. Pt w hypoglycemia, recent poor po intake. d50 bs improved. Meal. Labs. Will call med service to admit.   Suzi Roots, MD 01/17/13 0730

## 2013-01-17 NOTE — Progress Notes (Signed)
Patient had soiled himself in bed.  Woke up very confused, restless and agitated.  Will not keep his telemetry leads on. Vital signs is stable but CBG is 23.  Patient also pulled out his IV. Rapid response nurse called and provider on call notified.  Order received for IM glucagon.  Will administer and recheck CBG.

## 2013-01-17 NOTE — Progress Notes (Signed)
[facility] PROGRESS NOTE  [name] [name] WGN:[id number] DOB: 07-18-1931 DOA: 01/16/2013 PCP: Alva Garnet., MD   HPI/Subjective: Feels much better, had episode of hypoglycemia last night.  Assessment/Plan:  Hypoglycemia -Patient has insulin-dependent diabetes mellitus, he does not do his own insulin. -Reported that his son or a nurse has to get him the insulin. -Presented with significant hypoglycemia, likely secondary to insulin and acute renal failure. -Patient right now on D5W, we will try to wean him off of the IV D5.  Acute renal failure -This is likely secondary to dehydration, hold nephrotoxic medications including Diovan, HCTZ and Lasix. -Continue gentle hydration with IV fluids. -Obtain renal ultrasound, check BMP in a.m.  Hypokalemia -Likely secondary to diuretics, this was repleted in the ED by oral supplements.  Fall -Needs physical therapy evaluation, patient seems to need more supervision with his insulin. -He might need to go to skilled nursing facility versus assisted living facility.  Code Status: Full Family Communication: Discussed with the patient Disposition Plan: Remain as inpatient   Consultants:  None  Procedures:  None  Antibiotics:  No antibiotics   Objective: Filed Vitals:   01/16/13 2127 01/17/13 0257 01/17/13 0433 01/17/13 0830  BP: 95/51  107/20 102/53  Pulse: 88  75 81  Temp: 98.2 F (36.8 C)  98.1 F (36.7 C) 98.5 F (36.9 C)  TempSrc: Oral  Oral Oral  Resp: 18  18 18   Height: 5\' 9"  (1.753 m)     Weight: 96.707 kg (213 lb 3.2 oz)     SpO2: 100% 96% 98% 98%   No intake or output data in the 24 hours ending 01/17/13 1024 Filed Weights   01/16/13 2127  Weight: 96.707 kg (213 lb 3.2 oz)    Exam:  General: Alert and awake, oriented x3, not in any acute distress. HEENT: anicteric sclera, pupils reactive to light and accommodation, EOMI CVS: S1-S2 clear, no murmur rubs or gallops Chest: clear to auscultation  bilaterally, no wheezing, rales or rhonchi Abdomen: soft nontender, nondistended, normal bowel sounds, no organomegaly Extremities: no cyanosis, clubbing or edema noted bilaterally Neuro: Cranial nerves II-XII intact, no focal neurological deficits  Data Reviewed: Basic Metabolic Panel:  Recent Labs Lab 01/16/13 1212 01/16/13 2007 01/17/13 0530  NA 132*  --  131*  K 3.1*  --  3.1*  CL 85*  --  90*  CO2 34*  --  30  GLUCOSE 132*  --  81  BUN 95*  --  92*  CREATININE 3.93* 3.77* 3.56*  CALCIUM 9.5  --  8.8   Liver Function Tests:  Recent Labs Lab 01/16/13 1212  AST 69*  ALT 19  ALKPHOS 34*  BILITOT 0.8  PROT 7.6  ALBUMIN 3.7   No results found for this basename: LIPASE, AMYLASE,  in the last 168 hours No results found for this basename: AMMONIA,  in the last 168 hours CBC:  Recent Labs Lab 01/16/13 1212 01/16/13 2007 01/17/13 0530  WBC 13.0* 11.8* 14.4*  HGB 9.5* 9.8* 8.8*  HCT 27.0* 28.2* 25.2*  MCV 88.2 89.2 89.4  PLT 398 409* 402*   Cardiac Enzymes:  Recent Labs Lab 01/16/13 1416 01/16/13 2007 01/16/13 2336 01/17/13 0530  TROPONINI <0.30 <0.30 <0.30 <0.30   BNP (last 3 results) No results found for this basename: PROBNP,  in the last 8760 hours CBG:  Recent Labs Lab 01/16/13 2134 01/17/13 0240 01/17/13 0252 01/17/13 0329 01/17/13 0604  GLUCAP 110* 23* 32* 190* 79    No results found  for this or any previous visit (from the past 240 hour(s)).   Studies: US Renal  01/16/2013   *RADIOLOGY REPORT*  Clinical Data: Acute renal failure.  RENAL/URINARY TRACT ULTRASOUND COMPLETE  Comparison:  None.  Findings:  Right Kidney:  Renal cortical thinning.  10.6 cm long axis.  No hydronephrosis.  The 6 mm calcification in the inferior renal pole the most compatible with collecting system calculus.  Left Kidney:  12.2 cm.  Normal central sinus echo complex.  No calculi or hydronephrosis.  Renal cortical thinning.  Bladder:  Prevoid volume is 743 mm.   Postvoid residual is 299 ml, significant.  No debris or trabeculation.  IMPRESSION: 1.  Bilateral renal cortical thinning compatible with medical renal disease. 2.  6 mm calculus in the left inferior pole collecting system. 3.  300 ml postvoid bladder residual.   Original Report Authenticated By: Andreas Newport, M.D.   Dg Chest Port 1 View  01/16/2013   *RADIOLOGY REPORT*  Clinical Data: Chest pain.  PORTABLE CHEST - 1 VIEW  Comparison: PA and lateral chest 08/29/2011 and CT chest 08/30/2011.  Findings: Lungs are clear.  Heart size is normal.  No pneumothorax or pleural fluid.  IMPRESSION: No acute disease.   Original Report Authenticated By: Holley Dexter, M.D.    Scheduled Meds: . fenofibrate  160 mg Oral Daily  . heparin  5,000 Units Subcutaneous Q8H  . insulin aspart  0-5 Units Subcutaneous QHS  . insulin aspart  0-9 Units Subcutaneous TID WC  . PARoxetine  10 mg Oral [id number]  . sodium chloride  3 mL Intravenous Q12H   Continuous Infusions: . dextrose 50 mL/hr at 01/17/13 0449    Principal Problem:   Hypoglycemia Active Problems:   CKD (chronic kidney disease), stage III   Dehydration   Hypokalemia   Acute renal failure   Fall    Time spent: 35 minutes    [facility] A  [facility] Pager [phone] If 7PM-7AM, please contact night-coverage at [email or web address], password [facility] 01/17/2013, 10:24 AM  LOS: 1 day

## 2013-01-17 NOTE — Progress Notes (Signed)
Shift event: RN paged NP secondary to patient having CBG 23 and becoming extremely lethargic and agitated. Patient admitted earlier with hypoglycemia. Glucagon given because pt had pulled out his IV. NP to bedside.  Upon arrival, rapid response RN present along with multiple staff. IV restarted and D50 given. Shortly thereafter, patient is awake and appropriate in response, even has a sense of humor. CBG 190 after treatment. O2 sat normal throughout event. BP 90s and 1000cc NS bolus ordered with D5W at 50cc/hr after bolus is finished. Q2 hr CBGs for awhile.  [name] Norman, NP [facility]

## 2013-01-17 NOTE — Progress Notes (Signed)
Inpatient Diabetes Program Recommendations  AACE/ADA: New Consensus Statement on Inpatient Glycemic Control (2013)  Target Ranges:  Prepandial:   less than 140 mg/dL      Peak postprandial:   less than 180 mg/dL (1-2 hours)      Critically ill patients:  140 - 180 mg/dL     Patient admitted with hypoglycemia.  CBG 37 mg/dl on admit.  **Takes the following diabetes medications at home: Glipizide 20 mg daily Humalog 75/25 insulin (15 units units in the AM and 20 units in the PM)  **Per records, patient has a "friend" who gives him his insulin and helps him at home. Lives with blind roommate.  PT recommending short-term SNF for rehab.  Patient at present refusing SNF but may accept HHPT and HHRN.  **Given patient's renal function (has history of CKD) and inability to fully care for himself, he needs a simple diabetes regimen for home.  Per ADA Standards of Care, elderly patients who are at high risk for hypoglycemia should be allowed to have higher A1c goal (8% or less).  Tight CBG control should not be a high priority, however, CBGs should not allowed to be extremely elevated either.  Not sure if Glipizide and Humalog 75/25 insulin are the best choices for this patient.  Glipizide may put patient at higher risk for hypoglycemia since he does not clear medications well due to his decreased renal function.  Also, Humalog 75/25 insulin can put patient at risk for hypoglycemia if his PO intake at home is sub optimal.  **According to records, patient has decent Rx coverage (has Medicare and Medicaid coverage per hospital records).  It may benefit patient to switch his insulin/oral regimen to something different that will be safer for him.  We could switch patient to Levemir insulin QHS for home along with Tradjenta (once daily) to cover his meals.  Derrell Lolling does not have to be renally dosed and has a lower risk of hypoglycemia since it is glucose dependent.  Both Levemir and Tradgenta should be covered  by his health coverage plans.    MD- Please add Novolog Sensitive correction scale (SSI) tid with meals  Will follow and assist with d/c planning. Ambrose Finland RN, MSN, CDE Diabetes Coordinator Inpatient Diabetes Program [phone]

## 2013-01-17 NOTE — Progress Notes (Signed)
Pt received handout on hypoglycemia signs and symptoms and treatment. Verbalizes understanding but pt is very forgetful. Proper supervision appears necccessary. State she has someone at home with him to help care for him.

## 2013-01-18 DIAGNOSIS — E876 Hypokalemia: Secondary | ICD-10-CM

## 2013-01-18 LAB — BASIC METABOLIC PANEL
BUN: 79 mg/dL — ABNORMAL HIGH (ref 6–23)
Calcium: 8.9 mg/dL (ref 8.4–10.5)
Creatinine, Ser: 3.21 mg/dL — ABNORMAL HIGH (ref 0.50–1.35)
GFR calc Af Amer: 20 mL/min — ABNORMAL LOW (ref >90)
GFR calc non Af Amer: 17 mL/min — ABNORMAL LOW (ref >90)

## 2013-01-18 LAB — CBC
HCT: 24.6 % — ABNORMAL LOW (ref 39.0–52.0)
MCHC: 35 g/dL (ref 30.0–36.0)
Platelets: 374 10*3/uL (ref 150–400)
RDW: 12.6 % (ref 11.5–15.5)

## 2013-01-18 LAB — GLUCOSE, CAPILLARY: Glucose-Capillary: 165 mg/dL — ABNORMAL HIGH (ref 70–99)

## 2013-01-18 LAB — URINE CULTURE: Colony Count: 80000

## 2013-01-18 LAB — URINALYSIS, ROUTINE W REFLEX MICROSCOPIC
Bilirubin Urine: NEGATIVE
Hgb urine dipstick: NEGATIVE
Specific Gravity, Urine: 1.014 (ref 1.005–1.030)
Urobilinogen, UA: 1 mg/dL (ref 0.0–1.0)

## 2013-01-18 MED ORDER — INSULIN GLARGINE 100 UNIT/ML ~~LOC~~ SOLN
10.0000 [IU] | Freq: Every day | SUBCUTANEOUS | Status: DC
  Administered 2013-01-18: 10 [IU] via SUBCUTANEOUS
  Filled 2013-01-18 (×2): qty 0.1

## 2013-01-18 MED ORDER — LINAGLIPTIN 5 MG PO TABS
5.0000 mg | ORAL_TABLET | Freq: Every day | ORAL | Status: DC
  Administered 2013-01-18 – 2013-01-20 (×3): 5 mg via ORAL
  Filled 2013-01-18 (×3): qty 1

## 2013-01-18 MED ORDER — ZOLPIDEM TARTRATE 5 MG PO TABS: 5.0000 mg | ORAL_TABLET | Freq: Every evening | ORAL | Status: DC | PRN

## 2013-01-18 NOTE — Progress Notes (Signed)
[facility] PROGRESS NOTE  [name] [name] FAO:[id number] DOB: 17-Jul-1931 DOA: 01/16/2013 PCP: Alva Garnet., MD   HPI/Subjective: Feels much better, blood sugar started to trend up  Assessment/Plan:  Hypoglycemia -Patient has insulin-dependent diabetes mellitus, he does not do his own insulin. -Reported that his son or a nurse has to get him the insulin. -Presented with significant hypoglycemia, likely secondary to insulin and acute renal failure. -No IV dextrose, blood pressure started to trend up, probably start insulin today. -Per diabetic coordinator recommendation, long-acting insulin and Tradjenta.  Acute renal failure -This is likely secondary to dehydration, hold nephrotoxic medications including Diovan, HCTZ and Lasix. -Continue gentle hydration with IV fluids. -Renal ultrasound showing medical renal disease, serum creatinine is improving.  Hypokalemia -Likely secondary to diuretics, this was repleted in the ED by oral supplements.  Fall -Needs physical therapy evaluation, patient seems to need more supervision with his insulin. -He might need to go to skilled nursing facility versus assisted living facility.  Code Status: Full Family Communication: Discussed with the patient Disposition Plan: Remain as inpatient   Consultants:  None  Procedures:  None  Antibiotics:  No antibiotics   Objective: Filed Vitals:   01/17/13 1806 01/17/13 2046 01/18/13 0520 01/18/13 0946  BP: 116/50 116/54 124/43 107/86  Pulse: 80 84 74 84  Temp: 98.4 F (36.9 C) 98.6 F (37 C) 98.6 F (37 C) 98.1 F (36.7 C)  TempSrc: Oral Oral Oral   Resp: 18 18 18 18   Height:      Weight:  99.61 kg (219 lb 9.6 oz)    SpO2: 98% 97% 98% 99%    Intake/Output Summary (Last 24 hours) at 01/18/13 1230 Last data filed at 01/18/13 0947  Gross per 24 hour  Intake 995.42 ml  Output   2351 ml  Net -1355.58 ml   Filed Weights   01/16/13 2127 01/17/13 2046  Weight: 96.707 kg  (213 lb 3.2 oz) 99.61 kg (219 lb 9.6 oz)    Exam:  General: Alert and awake, oriented x3, not in any acute distress. HEENT: anicteric sclera, pupils reactive to light and accommodation, EOMI CVS: S1-S2 clear, no murmur rubs or gallops Chest: clear to auscultation bilaterally, no wheezing, rales or rhonchi Abdomen: soft nontender, nondistended, normal bowel sounds, no organomegaly Extremities: no cyanosis, clubbing or edema noted bilaterally Neuro: Cranial nerves II-XII intact, no focal neurological deficits  Data Reviewed: Basic Metabolic Panel:  Recent Labs Lab 01/16/13 1212 01/16/13 2007 01/17/13 0530 01/18/13 0605  NA 132*  --  131* 132*  K 3.1*  --  3.1* 3.9  CL 85*  --  90* 93*  CO2 34*  --  30 29  GLUCOSE 132*  --  81 101*  BUN 95*  --  92* 79*  CREATININE 3.93* 3.77* 3.56* 3.21*  CALCIUM 9.5  --  8.8 8.9   Liver Function Tests:  Recent Labs Lab 01/16/13 1212  AST 69*  ALT 19  ALKPHOS 34*  BILITOT 0.8  PROT 7.6  ALBUMIN 3.7   No results found for this basename: LIPASE, AMYLASE,  in the last 168 hours No results found for this basename: AMMONIA,  in the last 168 hours CBC:  Recent Labs Lab 01/16/13 1212 01/16/13 2007 01/17/13 0530 01/18/13 0605  WBC 13.0* 11.8* 14.4* 8.8  HGB 9.5* 9.8* 8.8* 8.6*  HCT 27.0* 28.2* 25.2* 24.6*  MCV 88.2 89.2 89.4 90.8  PLT 398 409* 402* 374   Cardiac Enzymes:  Recent Labs Lab 01/16/13 1416 01/16/13 2007  01/16/13 2336 01/17/13 0530  TROPONINI <0.30 <0.30 <0.30 <0.30   BNP (last 3 results) No results found for this basename: PROBNP,  in the last 8760 hours CBG:  Recent Labs Lab 01/17/13 1748 01/17/13 2045 01/17/13 2248 01/18/13 0809 01/18/13 1139  GLUCAP 166* 232* 202* 93 243*    No results found for this or any previous visit (from the past 240 hour(s)).   Studies: US Renal  01/16/2013   *RADIOLOGY REPORT*  Clinical Data: Acute renal failure.  RENAL/URINARY TRACT ULTRASOUND COMPLETE  Comparison:   None.  Findings:  Right Kidney:  Renal cortical thinning.  10.6 cm long axis.  No hydronephrosis.  The 6 mm calcification in the inferior renal pole the most compatible with collecting system calculus.  Left Kidney:  12.2 cm.  Normal central sinus echo complex.  No calculi or hydronephrosis.  Renal cortical thinning.  Bladder:  Prevoid volume is 743 mm.  Postvoid residual is 299 ml, significant.  No debris or trabeculation.  IMPRESSION: 1.  Bilateral renal cortical thinning compatible with medical renal disease. 2.  6 mm calculus in the left inferior pole collecting system. 3.  300 ml postvoid bladder residual.   Original Report Authenticated By: Andreas Newport, M.D.   Dg Chest Port 1 View  01/16/2013   *RADIOLOGY REPORT*  Clinical Data: Chest pain.  PORTABLE CHEST - 1 VIEW  Comparison: PA and lateral chest 08/29/2011 and CT chest 08/30/2011.  Findings: Lungs are clear.  Heart size is normal.  No pneumothorax or pleural fluid.  IMPRESSION: No acute disease.   Original Report Authenticated By: Holley Dexter, M.D.    Scheduled Meds: . fenofibrate  160 mg Oral Daily  . heparin  5,000 Units Subcutaneous Q8H  . insulin aspart  0-9 Units Subcutaneous TID WC  . PARoxetine  10 mg Oral BH-q7a  . sodium chloride  3 mL Intravenous Q12H   Continuous Infusions: . sodium chloride 100 mL/hr at 01/18/13 1610    Principal Problem:   Hypoglycemia Active Problems:   CKD (chronic kidney disease), stage III   Dehydration   Hypokalemia   Acute renal failure   Fall    Time spent: 35 minutes    [facility] A  [facility] Pager [phone] If 7PM-7AM, please contact night-coverage at [email or web address], password [facility] 01/18/2013, 12:30 PM  LOS: 2 days

## 2013-01-18 NOTE — Progress Notes (Signed)
PT Cancellation Note  Patient Details Name: [name] [name] MRN: [id number] DOB: 07-Mar-1931   Cancelled Treatment:    Reason Eval/Treat Not Completed: Pain limiting ability to participate.  Patient declined PT this afternoon due to pain in RLE.  Will return at later date for PT.   Vena Austria 01/18/2013, 5:21 PM Durenda Hurt. Renaldo Fiddler, [facility] Acute Rehab Services Pager [phone]
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
## 2013-01-19 NOTE — Progress Notes (Signed)
[facility] PROGRESS NOTE  [name] [name] ZOX:[id number] DOB: [birth year] DOA: 01/16/2013 PCP: Alva Garnet., MD   HPI/Subjective: Feels much better, denies significant complaints.  Assessment/Plan:  Hypoglycemia -Patient has insulin-dependent diabetes mellitus. -Reported that his son or a nurse has to get him the insulin. -Presented with significant hypoglycemia, likely secondary to insulin and acute renal failure. -No IV dextrose, blood pressure started to trend up, probably start insulin today. -Per diabetic coordinator recommendation, long-acting insulin and Tradjenta. -Better control with 10 units of Lantus insulin and Tradjenta  Acute renal failure -This is likely secondary to dehydration, hold nephrotoxic medications including Diovan, HCTZ and Lasix. -Continue gentle hydration with IV fluids. -Renal ultrasound showing medical renal disease, serum creatinine is improving.  Hypokalemia -Likely secondary to diuretics, this was repleted in the ED by oral supplements.  Fall -Needs physical therapy evaluation, patient seems to need more supervision with his insulin. -He might need to go to skilled nursing facility versus assisted living facility.  Code Status: Full Family Communication: Discussed with the patient Disposition Plan: Remain as inpatient   Consultants:  None  Procedures:  None  Antibiotics:  No antibiotics   Objective: Filed Vitals:   01/18/13 1646 01/18/13 2024 01/19/13 0434 01/19/13 0909  BP: 132/26 126/42 147/56 130/59  Pulse: 75 92 80 84  Temp: 98.5 F (36.9 C) 97.3 F (36.3 C) 98.3 F (36.8 C) 98.7 F (37.1 C)  TempSrc:  Oral Oral Oral  Resp: 18 20 20 18   Height:  5\' 9"  (1.753 m)    Weight:  96.979 kg (213 lb 12.8 oz)    SpO2: 98% 100% 98% 97%    Intake/Output Summary (Last 24 hours) at 01/19/13 1040 Last data filed at 01/19/13 0900  Gross per 24 hour  Intake   2980 ml  Output    925 ml  Net   2055 ml   Filed Weights    01/16/13 2127 01/17/13 2046 01/18/13 2024  Weight: 96.707 kg (213 lb 3.2 oz) 99.61 kg (219 lb 9.6 oz) 96.979 kg (213 lb 12.8 oz)    Exam:  General: Alert and awake, oriented x3, not in any acute distress. HEENT: anicteric sclera, pupils reactive to light and accommodation, EOMI CVS: S1-S2 clear, no murmur rubs or gallops Chest: clear to auscultation bilaterally, no wheezing, rales or rhonchi Abdomen: soft nontender, nondistended, normal bowel sounds, no organomegaly Extremities: no cyanosis, clubbing or edema noted bilaterally Neuro: Cranial nerves II-XII intact, no focal neurological deficits  Data Reviewed: Basic Metabolic Panel:  Recent Labs Lab 01/16/13 1212 01/16/13 2007 01/17/13 0530 01/18/13 0605  NA 132*  --  131* 132*  K 3.1*  --  3.1* 3.9  CL 85*  --  90* 93*  CO2 34*  --  30 29  GLUCOSE 132*  --  81 101*  BUN 95*  --  92* 79*  CREATININE 3.93* 3.77* 3.56* 3.21*  CALCIUM 9.5  --  8.8 8.9   Liver Function Tests:  Recent Labs Lab 01/16/13 1212  AST 69*  ALT 19  ALKPHOS 34*  BILITOT 0.8  PROT 7.6  ALBUMIN 3.7   No results found for this basename: LIPASE, AMYLASE,  in the last 168 hours No results found for this basename: AMMONIA,  in the last 168 hours CBC:  Recent Labs Lab 01/16/13 1212 01/16/13 2007 01/17/13 0530 01/18/13 0605  WBC 13.0* 11.8* 14.4* 8.8  HGB 9.5* 9.8* 8.8* 8.6*  HCT 27.0* 28.2* 25.2* 24.6*  MCV 88.2 89.2 89.4 90.8  PLT  398 409* 402* 374   Cardiac Enzymes:  Recent Labs Lab 01/16/13 1416 01/16/13 2007 01/16/13 2336 01/17/13 0530  TROPONINI <0.30 <0.30 <0.30 <0.30   BNP (last 3 results) No results found for this basename: PROBNP,  in the last 8760 hours CBG:  Recent Labs Lab 01/18/13 0809 01/18/13 1139 01/18/13 1645 01/18/13 2139 01/19/13 0750  GLUCAP 93 243* 165* 254* 72    Recent Results (from the past 240 hour(s))  URINE CULTURE     Status: None   Collection Time    01/17/13  4:06 PM      Result Value  Range Status   Specimen Description URINE, RANDOM   Final   Special Requests NONE   Final   Culture  Setup Time 01/17/2013 23:09   Final   Colony Count 80,000 COLONIES/ML   Final   Culture     Final   Value: Multiple bacterial morphotypes present, none predominant. Suggest appropriate recollection if clinically indicated.   Report Status 01/18/2013 FINAL   Final     Studies: No results found.  Scheduled Meds: . fenofibrate  160 mg Oral Daily  . heparin  5,000 Units Subcutaneous Q8H  . insulin aspart  0-9 Units Subcutaneous TID WC  . insulin glargine  10 Units Subcutaneous QHS  . linagliptin  5 mg Oral Daily  . PARoxetine  10 mg Oral BH-q7a  . sodium chloride  3 mL Intravenous Q12H   Continuous Infusions: . sodium chloride 100 mL/hr at 01/18/13 1644    Principal Problem:   Hypoglycemia Active Problems:   CKD (chronic kidney disease), stage III   Dehydration   Hypokalemia   Acute renal failure   Fall    Time spent: 35 minutes    [facility] A  [facility] Pager [phone] If 7PM-7AM, please contact night-coverage at [email or web address], password [facility] 01/19/2013, 10:40 AM  LOS: 3 days

## 2013-01-20 LAB — BASIC METABOLIC PANEL
BUN: 45 mg/dL — ABNORMAL HIGH (ref 6–23)
Calcium: 8.5 mg/dL (ref 8.4–10.5)
GFR calc non Af Amer: 25 mL/min — ABNORMAL LOW (ref >90)
Glucose, Bld: 112 mg/dL — ABNORMAL HIGH (ref 70–99)
Potassium: 3.9 mEq/L (ref 3.5–5.1)

## 2013-01-20 LAB — GLUCOSE, CAPILLARY: Glucose-Capillary: 105 mg/dL — ABNORMAL HIGH (ref 70–99)

## 2013-01-20 MED ORDER — LINAGLIPTIN 5 MG PO TABS: 5.0000 mg | ORAL_TABLET | Freq: Every day | ORAL | Status: DC

## 2013-01-20 MED ORDER — DOXYCYCLINE HYCLATE 100 MG PO TABS: 100.0000 mg | ORAL_TABLET | Freq: Two times a day (BID) | ORAL | Status: DC

## 2013-01-20 NOTE — Progress Notes (Signed)
Pt. Got the d/c instructions and prescriptions.and follow up appointments as well.Pt. And his brother verbalized their understanding of the discharge instructions.IV was d/c.

## 2013-01-20 NOTE — Clinical Social Work Note (Signed)
Clinical Social Worker received referral for possible ST-SNF placement.  Chart reviewed and spoke with RN - patient continues to refuse placement to MD and MD has discharged him home.  RN states that patient has adequate transportation home.  RN Case Manager will follow up with patient to discuss home health needs.    CSW signing off - please re consult if social work needs arise.  Macario Golds, [place] [phone]

## 2013-01-20 NOTE — Discharge Summary (Signed)
Physician Discharge Summary  [name] [name] JXB:[id number] DOB: 25-Mar-1931 DOA: 01/16/2013  PCP: Alva Garnet., MD  Admit date: 01/16/2013 Discharge date: 01/20/2013  Time spent: 40 minutes  Recommendations for Outpatient Follow-up:  1. Followup with primary care physician in one week. 2. Check BMP in one week.  Discharge Diagnoses:  Principal Problem:   Hypoglycemia Active Problems:   CKD (chronic kidney disease), stage III   Dehydration   Hypokalemia   Acute renal failure   Fall   Discharge Condition: Stable  Diet recommendation: Carbohydrate modified diet  Filed Weights   01/17/13 2046 01/18/13 2024 01/19/13 2155  Weight: 99.61 kg (219 lb 9.6 oz) 96.979 kg (213 lb 12.8 oz) 99.98 kg (220 lb 6.7 oz)    History of present illness:  Patient is a 79year old male with possible underlying dementia, poor historian, with history of diabetes mellitus, hypertension, insulin-dependent was found on the floor the last 2 mornings. Patient reported he was not feeling well and had blurry vision, he is unable to give himself insulin. Patient reports that he lives at home alone but her friend comes by to visit him who is in training to be a CNA and gives him insulin. After the insulin, patient's symptoms worsened and he felt weak, sweaty and apparently had a fall (although somewhat unclear as patient is a poor historian). Patient also complained of mild substernal chest pain which started yesterday .To the EDP however at the time of my encounter he denied any chest pain. He had a recent increase in the insulin.   Hospital Course:   1. Hypoglycemia: Patient presents to the hospital with episode of hypoglycemia and confusion his CBG was as low as 35 taken by EMT. It was poorly understood what happened, patient is a very poor historian. He did not even know how much insulin he is on, there was concern that his insulin was increased recently, patient also in Glucotrol XL daily. And he has some  dehydration. As a matter of fact patient does not know exactly what type of insulin he is getting, he cannot see very well even with glasses, I'm very concerned about his well-being using insulin. Because of this fact, PT/OT evaluated the patient and recommended placement in assisted living facility because of his inability to administer insulin to himself, I spoke with his niece Mrs. Synetta Fail (she is a Psychologist, sport and exercise) and she said patient does not have any assistance at home, he had a lady friend who comes and helps him with his ADLs but her cell she is confused about his insulin. Patient refused to go to assisted-living facility and he wanted to go to home. Although his diabetes mellitus uncontrolled with hemoglobin A1c of 10.2 I elected not to send him home on insulin, I would rather him having high blood sugar than fatal hypoglycemic episodes. I started him on Tardjenta, this does not need any adjustment with renal function, he will followup with his primary-care physician next week, can be evaluated to add further hypoglycemic agents. Recommend agents does not have renal clearance like pioglitazones.  2. Diabetes mellitus type 2: Uncontrolled diabetes mellitus, hemoglobin A1c of 10.2. Insulin as recommended for this high A1c, but because of patient inability to administer insulin we will send him home on Tardjenta.  3. Acute renal failure: On background of chronic kidney disease stage III, his creatinine baseline is 2.0 from October of 2013, patient presented with a creatinine of 3.9. After hydration with IV fluids creatinine went down to 2.3 prior  to discharge. Patient discharged on Diovan HCT, needs BMP to be checked in one week.  4. Hypokalemia: Likely secondary to diuretics, this is occluded in the emergency department bowel supplements.  5. Fall: Likely secondary to hypoglycemic episode, PT evaluated the patient recommended ALF versus SNF but patient refused and he elected to go  home.    Procedures:  None  Consultations:  None  Discharge Exam: Filed Vitals:   01/19/13 1745 01/19/13 2120 01/19/13 2155 01/20/13 0553  BP: 145/60 152/64 132/57 131/70  Pulse: 89 80 82 84  Temp: 98.5 F (36.9 C) 99.4 F (37.4 C) 98.1 F (36.7 C) 98.8 F (37.1 C)  TempSrc: Oral Oral Oral Oral  Resp: 19 18 19 20   Height:   5\' 9"  (1.753 m)   Weight:   99.98 kg (220 lb 6.7 oz)   SpO2: 99% 96% 97% 91%   General: Alert and awake, oriented x3, not in any acute distress. HEENT: anicteric sclera, pupils reactive to light and accommodation, EOMI CVS: S1-S2 clear, no murmur rubs or gallops Chest: clear to auscultation bilaterally, no wheezing, rales or rhonchi Abdomen: soft nontender, nondistended, normal bowel sounds, no organomegaly Extremities: no cyanosis, clubbing or edema noted bilaterally Neuro: Cranial nerves II-XII intact, no focal neurological deficits  Discharge Instructions  Discharge Orders   Future Orders Complete By Expires     Diet Carb Modified  As directed     Discharge instructions  As directed     Comments:      Check your blood sugar at least twice a day, and take your sugar logbook to your primary care physician    Increase activity slowly  As directed         Medication List    STOP taking these medications       glipiZIDE 10 MG 24 hr tablet  Commonly known as:  GLUCOTROL XL     HUMALOG MIX 75/25 KWIKPEN (75-25) 100 UNIT/ML Supn  Generic drug:  Insulin Lispro Prot & Lispro      TAKE these medications       amLODipine 5 MG tablet  Commonly known as:  NORVASC  Take 5 mg by mouth daily.     fenofibrate 160 MG tablet  Take 160 mg by mouth daily.     furosemide 40 MG tablet  Commonly known as:  LASIX  Take 40 mg by mouth daily.     linagliptin 5 MG Tabs tablet  Commonly known as:  TRADJENTA  Take 1 tablet (5 mg total) by mouth daily.     PARoxetine 10 MG tablet  Commonly known as:  PAXIL  Take 10 mg by mouth every morning.      valsartan-hydrochlorothiazide 320-25 MG per tablet  Commonly known as:  DIOVAN-HCT  Take 1 tablet by mouth daily.       No Known Allergies     Follow-up Information   Follow up with Alva Garnet., MD In 1 week.   Contact information:   [address] [place] [address] [phone]        The results of significant diagnostics from this hospitalization (including imaging, microbiology, ancillary and laboratory) are listed below for reference.    Significant Diagnostic Studies: US Renal  01/16/2013   *RADIOLOGY REPORT*  Clinical Data: Acute renal failure.  RENAL/URINARY TRACT ULTRASOUND COMPLETE  Comparison:  None.  Findings:  Right Kidney:  Renal cortical thinning.  10.6 cm long axis.  No hydronephrosis.  The 6 mm calcification in  the inferior renal pole the most compatible with collecting system calculus.  Left Kidney:  12.2 cm.  Normal central sinus echo complex.  No calculi or hydronephrosis.  Renal cortical thinning.  Bladder:  Prevoid volume is 743 mm.  Postvoid residual is 299 ml, significant.  No debris or trabeculation.  IMPRESSION: 1.  Bilateral renal cortical thinning compatible with medical renal disease. 2.  6 mm calculus in the left inferior pole collecting system. 3.  300 ml postvoid bladder residual.   Original Report Authenticated By: Andreas Newport, M.D.   Dg Chest Port 1 View  01/16/2013   *RADIOLOGY REPORT*  Clinical Data: Chest pain.  PORTABLE CHEST - 1 VIEW  Comparison: PA and lateral chest 08/29/2011 and CT chest 08/30/2011.  Findings: Lungs are clear.  Heart size is normal.  No pneumothorax or pleural fluid.  IMPRESSION: No acute disease.   Original Report Authenticated By: Holley Dexter, M.D.    Microbiology: Recent Results (from the past 240 hour(s))  URINE CULTURE     Status: None   Collection Time    01/17/13  4:06 PM      Result Value Range Status   Specimen Description URINE, RANDOM   Final   Special Requests NONE   Final    Culture  Setup Time 01/17/2013 23:09   Final   Colony Count 80,000 COLONIES/ML   Final   Culture     Final   Value: Multiple bacterial morphotypes present, none predominant. Suggest appropriate recollection if clinically indicated.   Report Status 01/18/2013 FINAL   Final     Labs: Basic Metabolic Panel:  Recent Labs Lab 01/16/13 1212 01/16/13 2007 01/17/13 0530 01/18/13 0605 01/20/13 0345  NA 132*  --  131* 132* 134*  K 3.1*  --  3.1* 3.9 3.9  CL 85*  --  90* 93* 101  CO2 34*  --  30 29 27   GLUCOSE 132*  --  81 101* 112*  BUN 95*  --  92* 79* 45*  CREATININE 3.93* 3.77* 3.56* 3.21* 2.34*  CALCIUM 9.5  --  8.8 8.9 8.5   Liver Function Tests:  Recent Labs Lab 01/16/13 1212  AST 69*  ALT 19  ALKPHOS 34*  BILITOT 0.8  PROT 7.6  ALBUMIN 3.7   No results found for this basename: LIPASE, AMYLASE,  in the last 168 hours No results found for this basename: AMMONIA,  in the last 168 hours CBC:  Recent Labs Lab 01/16/13 1212 01/16/13 2007 01/17/13 0530 01/18/13 0605  WBC 13.0* 11.8* 14.4* 8.8  HGB 9.5* 9.8* 8.8* 8.6*  HCT 27.0* 28.2* 25.2* 24.6*  MCV 88.2 89.2 89.4 90.8  PLT 398 409* 402* 374   Cardiac Enzymes:  Recent Labs Lab 01/16/13 1416 01/16/13 2007 01/16/13 2336 01/17/13 0530  TROPONINI <0.30 <0.30 <0.30 <0.30   BNP: BNP (last 3 results) No results found for this basename: PROBNP,  in the last 8760 hours CBG:  Recent Labs Lab 01/19/13 0750 01/19/13 1150 01/19/13 1658 01/19/13 2126 01/20/13 0739  GLUCAP 72 160* 138* 166* 105*       Signed:  Fransisco Messmer A  [facility] 01/20/2013, 11:59 AM

## 2013-01-21 NOTE — Progress Notes (Addendum)
01/21/2013 1045 NCM received call from [facility]  # [phone], she assist pt with getting to his appts. States he receives 1 hour on M-F with [facility]. They have the paperwork to have PCP sign for additional hours. Currently receiving services with [employer].  Isidoro Donning RN CCM Case Mgmt phone [phone]

## 2013-01-21 NOTE — Progress Notes (Signed)
   CARE MANAGEMENT NOTE 01/21/2013  Patient:  BRECKEN, DEWOODY   Account Number:  [id number]  Date Initiated:  01/21/2013  Documentation initiated by:  [facility]  Subjective/Objective Assessment:   Hypoglycemia     Action/Plan:   has aide that comes 1-2 hours per day   Anticipated DC Date:  01/20/2013   Anticipated DC Plan:  HOME W HOME HEALTH SERVICES      DC Planning Services  CM consult      [facility] Choice  HOME HEALTH   Choice offered to / List presented to:  C-1 Patient        HH arranged  HH-2 PT  HH-3 OT  HH-1 RN      [facility] agency  [facility]   Status of service:  Completed, signed off Medicare Important Message given?   (If response is "NO", the following Medicare IM given date fields will be blank) Date Medicare IM given:   Date Additional Medicare IM given:    Discharge Disposition:  HOME W HOME HEALTH SERVICES  Per UR Regulation:    If discussed at Long Length of Stay Meetings, dates discussed:    Comments:  01/21/2013 1011 NCM contacted pt at home. States he will have his HH aide give NCM a call. Pt did have [facility] in the past for [facility]. Will arrange [facility] with [facility] for follow up post dc. Isidoro Donning RN CCM Case Mgmt phone [phone]  01/20/2013 1300 NCM spoke to pt and offered choice for [facility]. States he has assistance from agency that comes each day. He did not know the name. Will give NCM a call back when he gets home with information on the name of agency. Isidoro Donning RN CCM Case Mgmt phone [phone]

## 2013-02-19 ENCOUNTER — Other Ambulatory Visit: Payer: Self-pay | Admitting: Internal Medicine

## 2013-02-19 DIAGNOSIS — N289 Disorder of kidney and ureter, unspecified: Secondary | ICD-10-CM

## 2013-02-25 ENCOUNTER — Ambulatory Visit
Admission: RE | Admit: 2013-02-25 | Discharge: 2013-02-25 | Disposition: A | Discharge status: Home / Self Care | Payer: Medicare Other | Source: Ambulatory Visit | Attending: Internal Medicine | Admitting: Internal Medicine

## 2013-02-25 DIAGNOSIS — N289 Disorder of kidney and ureter, unspecified: Secondary | ICD-10-CM

## 2013-03-23 ENCOUNTER — Encounter (HOSPITAL_COMMUNITY): Payer: Self-pay

## 2013-03-23 ENCOUNTER — Inpatient Hospital Stay (HOSPITAL_COMMUNITY)
Admission: EM | Admit: 2013-03-23 | Discharge: 2013-03-28 | DRG: 292 | Disposition: A | Discharge status: Home Health | Payer: PRIVATE HEALTH INSURANCE | Attending: Internal Medicine | Admitting: Internal Medicine

## 2013-03-23 ENCOUNTER — Emergency Department (HOSPITAL_COMMUNITY): Payer: PRIVATE HEALTH INSURANCE

## 2013-03-23 DIAGNOSIS — E860 Dehydration: Secondary | ICD-10-CM

## 2013-03-23 DIAGNOSIS — Z87891 Personal history of nicotine dependence: Secondary | ICD-10-CM

## 2013-03-23 DIAGNOSIS — I10 Essential (primary) hypertension: Secondary | ICD-10-CM

## 2013-03-23 DIAGNOSIS — Z79899 Other long term (current) drug therapy: Secondary | ICD-10-CM

## 2013-03-23 DIAGNOSIS — E162 Hypoglycemia, unspecified: Secondary | ICD-10-CM

## 2013-03-23 DIAGNOSIS — J441 Chronic obstructive pulmonary disease with (acute) exacerbation: Secondary | ICD-10-CM

## 2013-03-23 DIAGNOSIS — E872 Acidosis, unspecified: Secondary | ICD-10-CM | POA: Diagnosis present

## 2013-03-23 DIAGNOSIS — J811 Chronic pulmonary edema: Secondary | ICD-10-CM

## 2013-03-23 DIAGNOSIS — N183 Chronic kidney disease, stage 3 unspecified: Secondary | ICD-10-CM

## 2013-03-23 DIAGNOSIS — R911 Solitary pulmonary nodule: Secondary | ICD-10-CM

## 2013-03-23 DIAGNOSIS — E785 Hyperlipidemia, unspecified: Secondary | ICD-10-CM | POA: Diagnosis present

## 2013-03-23 DIAGNOSIS — I498 Other specified cardiac arrhythmias: Secondary | ICD-10-CM | POA: Diagnosis present

## 2013-03-23 DIAGNOSIS — I509 Heart failure, unspecified: Secondary | ICD-10-CM | POA: Diagnosis present

## 2013-03-23 DIAGNOSIS — I5033 Acute on chronic diastolic (congestive) heart failure: Secondary | ICD-10-CM

## 2013-03-23 DIAGNOSIS — R0902 Hypoxemia: Secondary | ICD-10-CM

## 2013-03-23 DIAGNOSIS — I129 Hypertensive chronic kidney disease with stage 1 through stage 4 chronic kidney disease, or unspecified chronic kidney disease: Secondary | ICD-10-CM | POA: Diagnosis present

## 2013-03-23 DIAGNOSIS — IMO0002 Reserved for concepts with insufficient information to code with codable children: Secondary | ICD-10-CM

## 2013-03-23 DIAGNOSIS — J189 Pneumonia, unspecified organism: Secondary | ICD-10-CM

## 2013-03-23 DIAGNOSIS — R0603 Acute respiratory distress: Secondary | ICD-10-CM

## 2013-03-23 DIAGNOSIS — E8729 Other acidosis: Secondary | ICD-10-CM

## 2013-03-23 DIAGNOSIS — E876 Hypokalemia: Secondary | ICD-10-CM

## 2013-03-23 DIAGNOSIS — E871 Hypo-osmolality and hyponatremia: Secondary | ICD-10-CM

## 2013-03-23 DIAGNOSIS — IMO0001 Reserved for inherently not codable concepts without codable children: Secondary | ICD-10-CM

## 2013-03-23 DIAGNOSIS — R5381 Other malaise: Secondary | ICD-10-CM | POA: Diagnosis present

## 2013-03-23 DIAGNOSIS — N179 Acute kidney failure, unspecified: Secondary | ICD-10-CM

## 2013-03-23 LAB — BASIC METABOLIC PANEL
Chloride: 94 mEq/L — ABNORMAL LOW (ref 96–112)
Creatinine, Ser: 1.33 mg/dL (ref 0.50–1.35)
GFR calc Af Amer: 57 mL/min — ABNORMAL LOW (ref >90)
Potassium: 3 mEq/L — ABNORMAL LOW (ref 3.5–5.1)

## 2013-03-23 LAB — HEPATIC FUNCTION PANEL
Alkaline Phosphatase: 39 U/L (ref 39–117)
Bilirubin, Direct: 0.5 mg/dL — ABNORMAL HIGH (ref 0.0–0.3)
Indirect Bilirubin: 0.8 mg/dL (ref 0.3–0.9)
Total Bilirubin: 1.3 mg/dL — ABNORMAL HIGH (ref 0.3–1.2)
Total Protein: 7.8 g/dL (ref 6.0–8.3)

## 2013-03-23 LAB — CBC WITH DIFFERENTIAL/PLATELET
Basophils Absolute: 0 10*3/uL (ref 0.0–0.1)
Basophils Relative: 0 % (ref 0–1)
MCHC: 34 g/dL (ref 30.0–36.0)
Monocytes Absolute: 0.9 10*3/uL (ref 0.1–1.0)
Neutro Abs: 10.1 10*3/uL — ABNORMAL HIGH (ref 1.7–7.7)
Neutrophils Relative %: 83 % — ABNORMAL HIGH (ref 43–77)
Platelets: 291 10*3/uL (ref 150–400)
RDW: 13 % (ref 11.5–15.5)

## 2013-03-23 LAB — POCT I-STAT TROPONIN I: Troponin i, poc: 0.06 ng/mL (ref 0.00–0.08)

## 2013-03-23 LAB — POCT I-STAT 3, ART BLOOD GAS (G3+)
Acid-base deficit: 4 mmol/L — ABNORMAL HIGH (ref 0.0–2.0)
pO2, Arterial: 199 mmHg — ABNORMAL HIGH (ref 80.0–100.0)

## 2013-03-23 LAB — PRO B NATRIURETIC PEPTIDE: Pro B Natriuretic peptide (BNP): 12032 pg/mL — ABNORMAL HIGH (ref 0–450)

## 2013-03-23 LAB — TROPONIN I: Troponin I: <0.3 ng/mL (ref <0.30)

## 2013-03-23 LAB — GLUCOSE, CAPILLARY: Glucose-Capillary: 350 mg/dL — ABNORMAL HIGH (ref 70–99)

## 2013-03-23 MED ORDER — DEXTROSE 5 % IV SOLN
1.0000 g | INTRAVENOUS | Status: DC
  Administered 2013-03-23: 1 g via INTRAVENOUS
  Filled 2013-03-23: qty 10

## 2013-03-23 MED ORDER — FUROSEMIDE 10 MG/ML IJ SOLN
20.0000 mg | Freq: Once | INTRAMUSCULAR | Status: AC
  Administered 2013-03-23: 20 mg via INTRAVENOUS
  Filled 2013-03-23: qty 2

## 2013-03-23 MED ORDER — DEXTROSE 5 % IV SOLN
500.0000 mg | Freq: Once | INTRAVENOUS | Status: AC
  Administered 2013-03-23: 500 mg via INTRAVENOUS
  Filled 2013-03-23: qty 500

## 2013-03-23 MED ORDER — ALBUTEROL (5 MG/ML) CONTINUOUS INHALATION SOLN
15.0000 mg/h | INHALATION_SOLUTION | Freq: Once | RESPIRATORY_TRACT | Status: AC
  Administered 2013-03-23: 15 mg/h via RESPIRATORY_TRACT
  Filled 2013-03-23: qty 20

## 2013-03-23 MED ORDER — ENOXAPARIN SODIUM 120 MG/0.8ML ~~LOC~~ SOLN
1.0000 mg/kg | Freq: Two times a day (BID) | SUBCUTANEOUS | Status: DC
  Administered 2013-03-23 – 2013-03-27 (×8): 105 mg via SUBCUTANEOUS
  Filled 2013-03-23 (×10): qty 0.8

## 2013-03-23 MED ORDER — INSULIN ASPART 100 UNIT/ML ~~LOC~~ SOLN
0.0000 [IU] | Freq: Three times a day (TID) | SUBCUTANEOUS | Status: DC
  Administered 2013-03-24: 2 [IU] via SUBCUTANEOUS
  Administered 2013-03-24: 8 [IU] via SUBCUTANEOUS
  Administered 2013-03-25: 5 [IU] via SUBCUTANEOUS
  Administered 2013-03-25: 3 [IU] via SUBCUTANEOUS
  Administered 2013-03-26 (×2): 2 [IU] via SUBCUTANEOUS
  Administered 2013-03-26: 5 [IU] via SUBCUTANEOUS
  Administered 2013-03-27: 3 [IU] via SUBCUTANEOUS
  Administered 2013-03-27: 2 [IU] via SUBCUTANEOUS
  Administered 2013-03-27: 3 [IU] via SUBCUTANEOUS
  Administered 2013-03-28: 1 [IU] via SUBCUTANEOUS
  Administered 2013-03-28: 5 [IU] via SUBCUTANEOUS

## 2013-03-23 MED ORDER — POTASSIUM CHLORIDE CRYS ER 20 MEQ PO TBCR
40.0000 meq | EXTENDED_RELEASE_TABLET | Freq: Two times a day (BID) | ORAL | Status: DC
  Administered 2013-03-23 – 2013-03-24 (×2): 40 meq via ORAL
  Filled 2013-03-23 (×4): qty 2

## 2013-03-23 MED ORDER — FENOFIBRATE 160 MG PO TABS
160.0000 mg | ORAL_TABLET | Freq: Every day | ORAL | Status: DC
  Administered 2013-03-24 – 2013-03-28 (×5): 160 mg via ORAL
  Filled 2013-03-23 (×5): qty 1

## 2013-03-23 MED ORDER — FUROSEMIDE 10 MG/ML IJ SOLN
40.0000 mg | Freq: Once | INTRAMUSCULAR | Status: AC
  Administered 2013-03-23: 40 mg via INTRAVENOUS
  Filled 2013-03-23: qty 4

## 2013-03-23 MED ORDER — LEVALBUTEROL HCL 0.63 MG/3ML IN NEBU
0.6300 mg | INHALATION_SOLUTION | Freq: Four times a day (QID) | RESPIRATORY_TRACT | Status: DC
  Filled 2013-03-23 (×3): qty 3

## 2013-03-23 MED ORDER — DEXTROSE 5 % IV SOLN: 500.0000 mg | INTRAVENOUS | Status: DC

## 2013-03-23 MED ORDER — METHYLPREDNISOLONE SODIUM SUCC 125 MG IJ SOLR
60.0000 mg | Freq: Two times a day (BID) | INTRAMUSCULAR | Status: DC
  Administered 2013-03-23 – 2013-03-24 (×3): 60 mg via INTRAVENOUS
  Filled 2013-03-23 (×3): qty 0.96

## 2013-03-23 MED ORDER — IPRATROPIUM BROMIDE 0.02 % IN SOLN
1.0000 mg | Freq: Once | RESPIRATORY_TRACT | Status: AC
  Administered 2013-03-23: 1 mg via RESPIRATORY_TRACT
  Filled 2013-03-23: qty 5

## 2013-03-23 MED ORDER — SODIUM CHLORIDE 0.9 % IJ SOLN
3.0000 mL | Freq: Two times a day (BID) | INTRAMUSCULAR | Status: DC
  Administered 2013-03-23 – 2013-03-28 (×10): 3 mL via INTRAVENOUS

## 2013-03-23 MED ORDER — ONDANSETRON HCL 4 MG/2ML IJ SOLN: 4.0000 mg | Freq: Four times a day (QID) | INTRAMUSCULAR | Status: DC | PRN

## 2013-03-23 MED ORDER — ONDANSETRON HCL 4 MG PO TABS: 4.0000 mg | ORAL_TABLET | Freq: Four times a day (QID) | ORAL | Status: DC | PRN

## 2013-03-23 MED ORDER — LINAGLIPTIN 5 MG PO TABS
5.0000 mg | ORAL_TABLET | Freq: Every day | ORAL | Status: DC
  Administered 2013-03-24 – 2013-03-28 (×5): 5 mg via ORAL
  Filled 2013-03-23 (×5): qty 1

## 2013-03-23 MED ORDER — DEXTROSE 5 % IV SOLN
500.0000 mg | INTRAVENOUS | Status: AC
  Administered 2013-03-24 – 2013-03-26 (×3): 500 mg via INTRAVENOUS
  Filled 2013-03-23 (×4): qty 500

## 2013-03-23 MED ORDER — INSULIN ASPART 100 UNIT/ML ~~LOC~~ SOLN
3.0000 [IU] | Freq: Three times a day (TID) | SUBCUTANEOUS | Status: DC
  Administered 2013-03-24 – 2013-03-28 (×13): 3 [IU] via SUBCUTANEOUS

## 2013-03-23 MED ORDER — ALBUTEROL SULFATE (5 MG/ML) 0.5% IN NEBU
2.5000 mg | INHALATION_SOLUTION | RESPIRATORY_TRACT | Status: DC
  Administered 2013-03-23 – 2013-03-24 (×6): 2.5 mg via RESPIRATORY_TRACT
  Filled 2013-03-23 (×6): qty 0.5

## 2013-03-23 MED ORDER — ACETAMINOPHEN 650 MG RE SUPP: 650.0000 mg | Freq: Four times a day (QID) | RECTAL | Status: DC | PRN

## 2013-03-23 MED ORDER — ACETAMINOPHEN 325 MG PO TABS
650.0000 mg | ORAL_TABLET | Freq: Four times a day (QID) | ORAL | Status: DC | PRN
  Administered 2013-03-24 – 2013-03-26 (×2): 650 mg via ORAL
  Filled 2013-03-23 (×2): qty 2

## 2013-03-23 MED ORDER — PANTOPRAZOLE SODIUM 40 MG PO TBEC
40.0000 mg | DELAYED_RELEASE_TABLET | Freq: Every day | ORAL | Status: DC
  Administered 2013-03-23 – 2013-03-28 (×6): 40 mg via ORAL
  Filled 2013-03-23 (×6): qty 1

## 2013-03-23 MED ORDER — DEXTROSE 5 % IV SOLN
1.0000 g | INTRAVENOUS | Status: DC
  Administered 2013-03-24 – 2013-03-26 (×3): 1 g via INTRAVENOUS
  Filled 2013-03-23 (×4): qty 10

## 2013-03-23 MED ORDER — ENOXAPARIN SODIUM 40 MG/0.4ML ~~LOC~~ SOLN
40.0000 mg | SUBCUTANEOUS | Status: DC
  Filled 2013-03-23: qty 0.4

## 2013-03-23 NOTE — ED Provider Notes (Signed)
Date: 03/23/2013  Rate: 120  Rhythm: Sinus tachycardia  QRS Axis: normal  Intervals: normal  ST/T Wave abnormalities: normal  Conduction Disutrbances: none  Narrative Interpretation: Artifact is present, patient has sinus tachycardia, no obvious ischemic changes     Layla Maw Gaby Harney, DO 03/23/13 1537

## 2013-03-23 NOTE — H&P (Addendum)
[facility] History and Physical  [name] [name] WUJ:[id number] DOB: 03/19/1931 DOA: 03/23/2013  Referring physician: PCP: Alva Garnet., MD   Chief Complaint: Shortness of breath HPI:  79y.o. African American male with a history of hypertension, diabetes, hyperlipidemia who presents to the emergency department with moderate to severe respiratory distress. Patient's significant other states that he has been having shortness of breath for the past 2-3 weeks. Patient uses portable of intact when he needs it. He is unable to quantify how much oxygen he uses. Today he woke up and was extremely short of breath. EMS, patient's oxygen saturation was 86% on room air and he was tripoding, tachypneic and had accessory muscle use. Denies any fever at home.  In the ED he received  50 mg of albuterol and one Atrovent, 125 mg of Solu-Medrol and 2 g of IV magnesium prior to arrival. Immediately in the resuscitation bay, BiPAP was initiated for respiratory distress with continuous albuterol and 2 more Atrovent treatments. Patient reports that he is a full code. He denies any fever and has had mild cough without sputum production. Denies a prior history of PE or DVT. No history of CHF. He has been a smoker for approximately 20 years but quit at age 25. No prior history of COPD or asthma. He denies any chest pain.       Review of Systems: negative for the following  Constitutional: no fever  Eyes: no drainage  ENT: no runny nose  Cardiovascular: no chest pain  Resp: SOB  GI: no vomiting  GU: no dysuria  Integumentary: no rash  Allergy: no hives  Musculoskeletal: no leg swelling  Neurological: no slurred speech  ROS otherwise negative       Past Medical History  Diagnosis Date  . Hypertension   . Shortness of breath     with exertion  . Diabetes mellitus     does not check his blood sugar at home  . Arthritis   . Chronic kidney disease     renal insuficiency     Past Surgical  History  Procedure Laterality Date  . Leg surgery      both legs - unsure of exact operations - from trauma  . Tonsillectomy        Social History:  reports that he quit smoking about 3 years ago. He has never used smokeless tobacco. He reports that he does not drink alcohol or use illicit drugs.   No Known Allergies  History reviewed. No pertinent family history.   Prior to Admission medications   Medication Sig Start Date End Date Taking? Authorizing Provider  amLODipine (NORVASC) 5 MG tablet Take 5 mg by mouth daily.   Yes Historical Provider, MD  fenofibrate 160 MG tablet Take 160 mg by mouth daily.   Yes Historical Provider, MD  linagliptin (TRADJENTA) 5 MG TABS tablet Take 1 tablet (5 mg total) by mouth daily. 01/20/13  Yes Clydia Llano, MD  Vitamin D, Ergocalciferol, (DRISDOL) 50000 UNITS CAPS capsule Take 50,000 Units by mouth every 7 (seven) days. Takes on Tuesday   Yes Historical Provider, MD     Physical Exam: Filed Vitals:   03/23/13 1614 03/23/13 1615 03/23/13 1651 03/23/13 1751  BP: 125/110 147/84 160/79   Pulse: 107 107 108   Temp:   97.3 F (36.3 C)   TempSrc:   Axillary   Resp: 28 26 21    Height:   5\' 8"  (1.727 m)   Weight:   106.8 kg (235 lb  7.2 oz)   SpO2: 94% 96% 96% 98%     Constitutional: Vital signs reviewed. Patient is a well-developed and well-nourished in no acute distress and cooperative with exam. Alert and oriented x3.  Head: Normocephalic and atraumatic  Ear: TM normal bilaterally  Mouth: no erythema or exudates, MMM  Eyes: PERRL, EOMI, conjunctivae normal, No scleral icterus.  Neck: Supple, Trachea midline normal ROM, No JVD, mass, thyromegaly, or carotid bruit present.  Cardiovascular: RRR, S1 normal, S2 normal, no MRG, pulses symmetric and intact bilaterally  Pulmonary/Chest: CTAB, no wheezes, rales, or rhonchi  Abdominal: Soft. Non-tender, non-distended, bowel sounds are normal, no masses, organomegaly, or guarding present.  GU: no CVA  tenderness Musculoskeletal: No joint deformities, erythema, or stiffness, ROM full and no nontender Ext: no edema and no cyanosis, pulses palpable bilaterally (DP and PT)  Hematology: no cervical, inginal, or axillary adenopathy.  Neurological: A&O x3, Strenght is normal and symmetric bilaterally, cranial nerve II-XII are grossly intact, no focal motor deficit, sensory intact to light touch bilaterally.  Skin: Warm, dry and intact. No rash, cyanosis, or clubbing.  Psychiatric: Normal mood and affect. speech and behavior is normal. Judgment and thought content normal. Cognition and memory are normal.       Labs on Admission:    Basic Metabolic Panel:  Recent Labs Lab 03/23/13 1340  NA 131*  K 3.0*  CL 94*  CO2 19  GLUCOSE 228*  BUN 17  CREATININE 1.33  CALCIUM 8.7   Liver Function Tests: No results found for this basename: AST, ALT, ALKPHOS, BILITOT, PROT, ALBUMIN,  in the last 168 hours No results found for this basename: LIPASE, AMYLASE,  in the last 168 hours No results found for this basename: AMMONIA,  in the last 168 hours CBC:  Recent Labs Lab 03/23/13 1340  WBC 12.1*  NEUTROABS 10.1*  HGB 11.2*  HCT 32.9*  MCV 93.5  PLT 291   Cardiac Enzymes:  Recent Labs Lab 03/23/13 1608  TROPONINI <0.30    BNP (last 3 results)  Recent Labs  03/23/13 1340  PROBNP 12032.0*      CBG:  Recent Labs Lab 03/23/13 1625  GLUCAP 350*    Radiological Exams on Admission: Dg Chest Portable 1 View  03/23/2013   *RADIOLOGY REPORT*  Clinical Data: Shortness of breath.  PORTABLE CHEST - 1 VIEW  Comparison: 01/16/2013  Findings: Mildly degraded exam due to AP portable technique and patient body habitus.  Cardiomegaly accentuated by AP portable technique.  Small left greater than right bilateral pleural effusion. No pneumothorax. Low lung volumes.  Mild interstitial edema is accentuated.  Left greater than right bibasilar airspace disease. Artifact from support apparatus  projects over the right upper lung.  IMPRESSION: Mild congestive heart failure.  Low lung volumes.  Small bilateral pleural effusions with adjacent atelectasis.  At the left lung base, early infection is difficult to exclude.   Original Report Authenticated By: Jeronimo Greaves, M.D.    EKG: Independently reviewed. *   Assessment/Plan Active Problems:   * No active hospital problems. *   Shortness of breath Multifactorial Chest x-ray suggestive of possible pneumonia/pulmonary edema Received 60 mg of IV Lasix in the ER IV Solu-Medrol which will be continued Continue diuresis with IV Lasix Empiric antibiotics namely Rocephin and azithromycin Blood culture x2 BiPAP initiated in the ED but now saturating 97% on 3 L Of BiPAP and eating 2-D echo to further evaluate shortness of breath Continue to cycle cardiac enzymes Patient also has an  elevated d-dimer We'll empirically cover for PE tonight and do a VQ scan in the morning  Hypertension continue Norvasc, hold Diovan/HCTZ   Diabetes check hemoglobin A1c and start the patient on sliding scale insulin continue trajenta   Code Status:   full Family Communication: bedside Disposition Plan: admit   Time spent: 70 mins   [facility] Pager [phone]  If 7PM-7AM, please contact night-coverage [email or web address] Password [facility] 03/23/2013, 6:25 PM

## 2013-03-23 NOTE — Progress Notes (Signed)
Money sent and given to security, key and receipt paper placed in Pt's chart.

## 2013-03-23 NOTE — Progress Notes (Signed)
Pt has some money he would like to be locked in the safe. The money has been counted in front of the Pt x2 first by his Niece and then by 2 RN's. Security has been paged 2x and called x1 with no return call. Pt at this time has the money and the envelope at [facility].

## 2013-03-23 NOTE — ED Provider Notes (Signed)
TIME SEEN: 12:40 PM  CHIEF COMPLAINT: Respiratory distress  HPI: Patient is a 79 y.o. African American male with a history of hypertension, diabetes, hyperlipidemia who presents to the emergency department with moderate to severe respiratory distress. Patient's significant other states that he has been having shortness of breath for the past 2-3 weeks. Today he woke up and was extremely short of breath. EMS, patient's oxygen saturation was 86% on room air and he was tripoding, tachypneic and had accessory muscle use. He was given 50 mg of albuterol and one Atrovent, 125 mg of Solu-Medrol and 2 g of IV magnesium prior to arrival. Immediately in the resuscitation bay, BiPAP was initiated for respiratory distress with continuous albuterol and 2 more Atrovent treatments. Patient reports that he is a full code. He denies any fever and has had mild cough without sputum production. Denies a prior history of PE or DVT. No history of CHF. He has been a smoker for approximately 20 years but quit at age 44. No prior history of COPD or asthma. He denies any chest pain.  ROS: See HPI Constitutional: no fever  Eyes: no drainage  ENT: no runny nose   Cardiovascular:  no chest pain  Resp:  SOB  GI: no vomiting GU: no dysuria Integumentary: no rash  Allergy: no hives  Musculoskeletal: no leg swelling  Neurological: no slurred speech ROS otherwise negative  PAST MEDICAL HISTORY/PAST SURGICAL HISTORY:  Past Medical History  Diagnosis Date  . Hypertension   . Shortness of breath     with exertion  . Diabetes mellitus     does not check his blood sugar at home  . Arthritis   . Chronic kidney disease     renal insuficiency    MEDICATIONS:  Prior to Admission medications   Medication Sig Start Date End Date Taking? Authorizing Provider  amLODipine (NORVASC) 5 MG tablet Take 5 mg by mouth daily.    Historical Provider, MD  doxycycline (VIBRA-TABS) 100 MG tablet Take 1 tablet (100 mg total) by mouth 2  (two) times daily. 01/20/13   Clydia Llano, MD  fenofibrate 160 MG tablet Take 160 mg by mouth daily.    Historical Provider, MD  furosemide (LASIX) 40 MG tablet Take 40 mg by mouth daily.    Historical Provider, MD  linagliptin (TRADJENTA) 5 MG TABS tablet Take 1 tablet (5 mg total) by mouth daily. 01/20/13   Clydia Llano, MD  PARoxetine (PAXIL) 10 MG tablet Take 10 mg by mouth every morning.    Historical Provider, MD  valsartan-hydrochlorothiazide (DIOVAN-HCT) 320-25 MG per tablet Take 1 tablet by mouth daily.    Historical Provider, MD    ALLERGIES:  No Known Allergies  SOCIAL HISTORY:  History  Substance Use Topics  . Smoking status: Former Smoker -- 0.50 packs/day    Quit date: 05/14/2009  . Smokeless tobacco: Never Used  . Alcohol Use: No    FAMILY HISTORY: History reviewed. No pertinent family history.  EXAM: BP 151/121  Pulse 116  Resp 22  SpO2 100% CONSTITUTIONAL: Alert and oriented and responds appropriately to questions. Elderly, chronically ill-appearing, in moderate to severe respiratory distress HEAD: Normocephalic EYES: Conjunctivae clear, PERRL ENT: normal nose; no rhinorrhea; moist mucous membranes; pharynx without lesions noted NECK: Supple, no meningismus, no LAD  CARD: Tachycardic; S1 and S2 appreciated; no murmurs, no clicks, no rubs, no gallops RESP: Patient is in moderate to severe respiratory distress, he is tachypneic, tripoding, has accessory muscle use, JVD to the level  of the mandible while sitting at 90 ABD/GI: Normal bowel sounds; non-distended; soft, non-tender, no rebound, no guarding BACK:  The back appears normal and is non-tender to palpation, there is no CVA tenderness EXT: Normal ROM in all joints; non-tender to palpation; no edema; normal capillary refill; no cyanosis    SKIN: Normal color for age and race; warm NEURO: Moves all extremities equally PSYCH: The patient's mood and manner are appropriate. Grooming and personal hygiene are  appropriate.  MEDICAL DECISION MAKING: Patient in moderate to severe respiratory distress. May be due to CHF versus COPD. We'll start BiPAP, continuous nebulizer treatments, give Lasix as needed, obtain labs and chest x-ray.  ED PROGRESS:   1:40 PM  Pt appears much more comfortable on BiPAP. Tachypnea has improved. Tripoding resolvde. Accessory muscle use improved. His blood gas shows mild respiratory acidosis. Labs and portable chest x-ray pending. We'll need admission.  3:30 PM  CXR shows possible pneumonia as well as pulmonary edema. Have given Lasix and ceftriaxone and azithromycin for community-acquired pneumonia. Patient's blood cultures are pending. Discussed with hospitalist for admission to step down for a mixed picture of COPD, CHF, pneumonia. Patient is markedly improved and is currently off BiPAP and satting 97% on 3 L nasal cannula.  Layla Maw Breionna Punt, DO 03/23/13 1536

## 2013-03-23 NOTE — ED Notes (Signed)
Pt complains of sob, bib ems, family friend called ems, pt reporting sob for 2 weeks, called ems 1 time but did not transport, pt given albuterol 15 and atrovent 1 and solumedrol 125 by ems,pt still with labored breathing onarrival and very anxious, pt tripoding with ems on initial arrival

## 2013-03-23 NOTE — ED Notes (Signed)
BIPAP on arrival to ED, pt tolerating well.

## 2013-03-24 ENCOUNTER — Inpatient Hospital Stay (HOSPITAL_COMMUNITY): Payer: PRIVATE HEALTH INSURANCE

## 2013-03-24 DIAGNOSIS — E876 Hypokalemia: Secondary | ICD-10-CM

## 2013-03-24 DIAGNOSIS — J189 Pneumonia, unspecified organism: Secondary | ICD-10-CM

## 2013-03-24 DIAGNOSIS — J441 Chronic obstructive pulmonary disease with (acute) exacerbation: Secondary | ICD-10-CM

## 2013-03-24 DIAGNOSIS — E871 Hypo-osmolality and hyponatremia: Secondary | ICD-10-CM

## 2013-03-24 DIAGNOSIS — N179 Acute kidney failure, unspecified: Secondary | ICD-10-CM

## 2013-03-24 LAB — CBC
HCT: 28.9 % — ABNORMAL LOW (ref 39.0–52.0)
Hemoglobin: 10.1 g/dL — ABNORMAL LOW (ref 13.0–17.0)
MCH: 31.9 pg (ref 26.0–34.0)
MCV: 91.2 fL (ref 78.0–100.0)
RBC: 3.17 MIL/uL — ABNORMAL LOW (ref 4.22–5.81)

## 2013-03-24 LAB — GLUCOSE, CAPILLARY
Glucose-Capillary: 267 mg/dL — ABNORMAL HIGH (ref 70–99)
Glucose-Capillary: 92 mg/dL (ref 70–99)
Glucose-Capillary: 131 mg/dL — ABNORMAL HIGH (ref 70–99)
Glucose-Capillary: 161 mg/dL — ABNORMAL HIGH (ref 70–99)

## 2013-03-24 LAB — COMPREHENSIVE METABOLIC PANEL
BUN: 25 mg/dL — ABNORMAL HIGH (ref 6–23)
CO2: 21 mEq/L (ref 19–32)
Calcium: 8.4 mg/dL (ref 8.4–10.5)
Creatinine, Ser: 1.76 mg/dL — ABNORMAL HIGH (ref 0.50–1.35)
GFR calc Af Amer: 40 mL/min — ABNORMAL LOW (ref >90)
GFR calc non Af Amer: 35 mL/min — ABNORMAL LOW (ref >90)
Glucose, Bld: 342 mg/dL — ABNORMAL HIGH (ref 70–99)
Total Bilirubin: 0.6 mg/dL (ref 0.3–1.2)

## 2013-03-24 MED ORDER — IPRATROPIUM BROMIDE 0.02 % IN SOLN
0.5000 mg | Freq: Four times a day (QID) | RESPIRATORY_TRACT | Status: DC
  Administered 2013-03-24 – 2013-03-28 (×14): 0.5 mg via RESPIRATORY_TRACT
  Filled 2013-03-24 (×15): qty 2.5

## 2013-03-24 MED ORDER — ALBUTEROL SULFATE (5 MG/ML) 0.5% IN NEBU
2.5000 mg | INHALATION_SOLUTION | RESPIRATORY_TRACT | Status: DC | PRN
  Administered 2013-03-25: 2.5 mg via RESPIRATORY_TRACT
  Filled 2013-03-24: qty 0.5

## 2013-03-24 MED ORDER — ALBUTEROL SULFATE (5 MG/ML) 0.5% IN NEBU
2.5000 mg | INHALATION_SOLUTION | RESPIRATORY_TRACT | Status: DC
  Administered 2013-03-24 – 2013-03-25 (×6): 2.5 mg via RESPIRATORY_TRACT
  Filled 2013-03-24 (×6): qty 0.5

## 2013-03-24 MED ORDER — PREDNISONE 20 MG PO TABS
40.0000 mg | ORAL_TABLET | Freq: Every day | ORAL | Status: DC
  Administered 2013-03-25 – 2013-03-27 (×3): 40 mg via ORAL
  Filled 2013-03-24 (×4): qty 2

## 2013-03-24 MED ORDER — POTASSIUM CHLORIDE CRYS ER 20 MEQ PO TBCR
40.0000 meq | EXTENDED_RELEASE_TABLET | Freq: Once | ORAL | Status: AC
  Administered 2013-03-24: 40 meq via ORAL

## 2013-03-24 MED ORDER — TECHNETIUM TC 99M DIETHYLENETRIAME-PENTAACETIC ACID
40.0000 | Freq: Once | INTRAVENOUS | Status: AC | PRN
  Administered 2013-03-24: 40 via RESPIRATORY_TRACT

## 2013-03-24 MED ORDER — TECHNETIUM TO 99M ALBUMIN AGGREGATED
6.0000 | Freq: Once | INTRAVENOUS | Status: AC | PRN
  Administered 2013-03-24: 6 via INTRAVENOUS

## 2013-03-24 MED ORDER — AMLODIPINE BESYLATE 5 MG PO TABS
5.0000 mg | ORAL_TABLET | Freq: Every day | ORAL | Status: DC
  Administered 2013-03-25 – 2013-03-28 (×4): 5 mg via ORAL
  Filled 2013-03-24 (×4): qty 1

## 2013-03-24 MED ORDER — POTASSIUM CHLORIDE CRYS ER 20 MEQ PO TBCR
40.0000 meq | EXTENDED_RELEASE_TABLET | Freq: Every day | ORAL | Status: DC
  Administered 2013-03-25 – 2013-03-28 (×4): 40 meq via ORAL
  Filled 2013-03-24 (×4): qty 2

## 2013-03-24 NOTE — Progress Notes (Addendum)
[facility] PROGRESS NOTE  [name] [name] ZOX:[id number] DOB: Dec 13, 1930 DOA: 03/23/2013 PCP: Alva Garnet., MD  Assessment/Plan:  Active Problems: Lung infiltrates- CAP vs CHF -continue empiric abx with vanc and rocephin -recheck CXR in am, increasing wbc likely due to steroids- pt clinically improving -was given lasix in ED on admission with Cr bump to 1.76 today(~300cc neg), will hold off further lasix pending echo  -await VQ scan, CEs neg x3 -off BIPAP this am, follow -await VQ scan Hypokalemia -replace, follow and recheck Probable COPD exacerbation -though no h/o COPD was a smoker and quit 34yrs agoo -follow outpt with PCP for PFTs -change solumedrol beginning in am -continue bronchodilators HTN -resume norvasc in am DM -continue SSI and oral hypoglycemic Hyponatremia -follow and recheck off lasix ARF -follow recheck in am off lasix  Code Status: full Family Communication: none at bedside Disposition Plan: pending clinical course   Consultants:  none  Procedures:  Echo-pending  Antibiotics:  Rocephin and zithromax- started 8/9  HPI/Subjective: States breathing betted today, denies CP  Objective: Filed Vitals:   03/24/13 0408  BP: 132/53  Pulse: 95  Temp: 97.5 F (36.4 C)  Resp: 15    Intake/Output Summary (Last 24 hours) at 03/24/13 0830 Last data filed at 03/24/13 0500  Gross per 24 hour  Intake      3 ml  Output    400 ml  Net   -397 ml   Filed Weights   03/23/13 1651  Weight: 106.8 kg (235 lb 7.2 oz)    Exam:  General: alert & oriented x 3 In NAD, off bipap this am Cardiovascular: RRR, nl S1 s2 Respiratory: decreased air mov't, no wheezes Abdomen: soft +BS NT/ND, no masses palpable Extremities: No cyanosis and no edema    Data Reviewed: Basic Metabolic Panel:  Recent Labs Lab 03/23/13 1340 03/24/13 0410  NA 131* 128*  K 3.0* 3.1*  CL 94* 93*  CO2 19 21  GLUCOSE 228* 342*  BUN 17 25*  CREATININE 1.33 1.76*   CALCIUM 8.7 8.4   Liver Function Tests:  Recent Labs Lab 03/23/13 1608 03/24/13 0410  AST 63* 68*  ALT 23 23  ALKPHOS 39 31*  BILITOT 1.3* 0.6  PROT 7.8 7.0  ALBUMIN 3.6 3.1*   No results found for this basename: LIPASE, AMYLASE,  in the last 168 hours No results found for this basename: AMMONIA,  in the last 168 hours CBC:  Recent Labs Lab 03/23/13 1340 03/24/13 0410  WBC 12.1* 15.7*  NEUTROABS 10.1*  --   HGB 11.2* 10.1*  HCT 32.9* 28.9*  MCV 93.5 91.2  PLT 291 325   Cardiac Enzymes:  Recent Labs Lab 03/23/13 1608 03/23/13 2206 03/24/13 0410  TROPONINI <0.30 <0.30 <0.30   BNP (last 3 results)  Recent Labs  03/23/13 1340  PROBNP 12032.0*   CBG:  Recent Labs Lab 03/23/13 1625  GLUCAP 350*    Recent Results (from the past 240 hour(s))  MRSA PCR SCREENING     Status: None   Collection Time    03/23/13  5:23 PM      Result Value Range Status   MRSA by PCR NEGATIVE  NEGATIVE Final   Comment:            The GeneXpert MRSA Assay (FDA     approved for NASAL specimens     only), is one component of a     comprehensive MRSA colonization     surveillance program. It is not  intended to diagnose MRSA     infection nor to guide or     monitor treatment for     MRSA infections.     Studies: Dg Chest Portable 1 View  03/23/2013   *RADIOLOGY REPORT*  Clinical Data: Shortness of breath.  PORTABLE CHEST - 1 VIEW  Comparison: 01/16/2013  Findings: Mildly degraded exam due to AP portable technique and patient body habitus.  Cardiomegaly accentuated by AP portable technique.  Small left greater than right bilateral pleural effusion. No pneumothorax. Low lung volumes.  Mild interstitial edema is accentuated.  Left greater than right bibasilar airspace disease. Artifact from support apparatus projects over the right upper lung.  IMPRESSION: Mild congestive heart failure.  Low lung volumes.  Small bilateral pleural effusions with adjacent atelectasis.  At the  left lung base, early infection is difficult to exclude.   Original Report Authenticated By: Jeronimo Greaves, M.D.    Scheduled Meds: . albuterol  2.5 mg Nebulization Q4H  . azithromycin  500 mg Intravenous Q24H  . cefTRIAXone (ROCEPHIN) IVPB 1 gram/50 mL D5W  1 g Intravenous Q24H  . enoxaparin (LOVENOX) injection  1 mg/kg Subcutaneous Q12H  . fenofibrate  160 mg Oral Daily  . insulin aspart  0-15 Units Subcutaneous TID WC  . insulin aspart  3 Units Subcutaneous TID WC  . linagliptin  5 mg Oral Daily  . methylPREDNISolone (SOLU-MEDROL) injection  60 mg Intravenous Q12H  . pantoprazole  40 mg Oral Daily  . potassium chloride  40 mEq Oral BID  . sodium chloride  3 mL Intravenous Q12H   Continuous Infusions:    Time spent: 35    Khush Pasion C  [facility] Pager [phone]. If 7PM-7AM, please contact night-coverage at [email or web address], password [facility] 03/24/2013, 8:30 AM  LOS: 1 day

## 2013-03-25 ENCOUNTER — Inpatient Hospital Stay (HOSPITAL_COMMUNITY): Payer: PRIVATE HEALTH INSURANCE

## 2013-03-25 LAB — BASIC METABOLIC PANEL
Chloride: 98 mEq/L (ref 96–112)
Creatinine, Ser: 1.87 mg/dL — ABNORMAL HIGH (ref 0.50–1.35)
GFR calc Af Amer: 37 mL/min — ABNORMAL LOW (ref >90)
Potassium: 4.7 mEq/L (ref 3.5–5.1)
Sodium: 132 mEq/L — ABNORMAL LOW (ref 135–145)

## 2013-03-25 LAB — GLUCOSE, CAPILLARY
Glucose-Capillary: 189 mg/dL — ABNORMAL HIGH (ref 70–99)
Glucose-Capillary: 85 mg/dL (ref 70–99)
Glucose-Capillary: 168 mg/dL — ABNORMAL HIGH (ref 70–99)

## 2013-03-25 MED ORDER — ALBUTEROL SULFATE (5 MG/ML) 0.5% IN NEBU
2.5000 mg | INHALATION_SOLUTION | Freq: Four times a day (QID) | RESPIRATORY_TRACT | Status: DC
  Administered 2013-03-25 – 2013-03-28 (×11): 2.5 mg via RESPIRATORY_TRACT
  Filled 2013-03-25 (×12): qty 0.5

## 2013-03-25 MED ORDER — METHYLPREDNISOLONE SODIUM SUCC 40 MG IJ SOLR
40.0000 mg | Freq: Once | INTRAMUSCULAR | Status: AC
  Administered 2013-03-25: 40 mg via INTRAVENOUS
  Filled 2013-03-25: qty 1

## 2013-03-25 NOTE — Progress Notes (Addendum)
[facility] PROGRESS NOTE  [name] [name] WUJ:[id number] DOB: January 25, 1931 DOA: 03/23/2013 PCP: Alva Garnet., MD  Assessment/Plan: Active Problems:  Lung infiltrates- CAP vs CHF  -continue empiric abx with zithromax and rocephin  -await CXR this am, increasing wbc noted 8/10 was likely due to steroids - pt clinically improving  -was given lasix in ED on admission with Cr bump on follow up and still trending up today-1.87, which is close to his baseline of 2 per review of his old records. -continue to hold off further lasix pending echo  -VQ scan very low prob, CEs neg x3  -pt remains off BIPAP this am,will transfer to tele Hypokalemia  -repleted, 2/2 lasix Probable COPD exacerbation  -though no h/o COPD was a smoker and quit 74yrs agoo  -follow outpt with PCP for PFTs  -Continue steroids-prednisone started this a.m. -continue bronchodilators  HTN  -on norvasc in am  DM  -continue SSI and oral hypoglycemic  Hyponatremia  -Improved off lasix  CKD -cr Still trending up even off lasix, but it is noted that he does have a history of CkD stage III as creatinine at discharge in June 2014 was 2.3( base line cr was noted to be 2)    Code Status: full Family Communication:none at bedside Disposition Plan: transfer to tele   Consultants:  none  Procedures:  none  Antibiotics:  Rocephin and zithromax started 8/10  HPI/Subjective: States breathing better this a.m., did not require BiPAP overnight. Denies chest pain  Objective: Filed Vitals:   03/25/13 0820  BP: 142/59  Pulse: 109  Temp: 98.4 F (36.9 C)  Resp: 31    Intake/Output Summary (Last 24 hours) at 03/25/13 0853 Last data filed at 03/25/13 0826  Gross per 24 hour  Intake    773 ml  Output   1700 ml  Net   -927 ml   Filed Weights   03/23/13 1651  Weight: 106.8 kg (235 lb 7.2 oz)   Exam:  General: alert & oriented x 3 In NAD Cardiovascular: RRR, nl S1 s2  Respiratory: coarse BS, no wheezes, no  crackles  Abdomen: soft +BS NT/ND, no masses palpable  Extremities: No cyanosis and trace to +1 edema     Data Reviewed: Basic Metabolic Panel:  Recent Labs Lab 03/23/13 1340 03/24/13 0410 03/25/13 0500  NA 131* 128* 132*  K 3.0* 3.1* 4.7  CL 94* 93* 98  CO2 19 21 21   GLUCOSE 228* 342* 226*  BUN 17 25* 35*  CREATININE 1.33 1.76* 1.87*  CALCIUM 8.7 8.4 8.6   Liver Function Tests:  Recent Labs Lab 03/23/13 1608 03/24/13 0410  AST 63* 68*  ALT 23 23  ALKPHOS 39 31*  BILITOT 1.3* 0.6  PROT 7.8 7.0  ALBUMIN 3.6 3.1*   No results found for this basename: LIPASE, AMYLASE,  in the last 168 hours No results found for this basename: AMMONIA,  in the last 168 hours CBC:  Recent Labs Lab 03/23/13 1340 03/24/13 0410  WBC 12.1* 15.7*  NEUTROABS 10.1*  --   HGB 11.2* 10.1*  HCT 32.9* 28.9*  MCV 93.5 91.2  PLT 291 325   Cardiac Enzymes:  Recent Labs Lab 03/23/13 1608 03/23/13 2206 03/24/13 0410  TROPONINI <0.30 <0.30 <0.30   BNP (last 3 results)  Recent Labs  03/23/13 1340  PROBNP 12032.0*   CBG:  Recent Labs Lab 03/24/13 0816 03/24/13 1329 03/24/13 1744 03/24/13 2143 03/25/13 0759  GLUCAP 267* 92 131* 161* 213*    Recent  Results (from the past 240 hour(s))  MRSA PCR SCREENING     Status: None   Collection Time    03/23/13  5:23 PM      Result Value Range Status   MRSA by PCR NEGATIVE  NEGATIVE Final   Comment:            The GeneXpert MRSA Assay (FDA     approved for NASAL specimens     only), is one component of a     comprehensive MRSA colonization     surveillance program. It is not     intended to diagnose MRSA     infection nor to guide or     monitor treatment for     MRSA infections.     Studies: Nm Pulmonary Perf And Vent  03/24/2013   *RADIOLOGY REPORT*  Clinical Data:  Hypertension.  Shortness of breath.  Abnormal D- dimer.  NUCLEAR MEDICINE VENTILATION - PERFUSION LUNG SCAN  Technique:  Ventilation images were obtained in  multiple projections using inhaled aerosol technetium 99 M DTPA.  Perfusion images were obtained in multiple projections after intravenous injection of Tc-33m MAA.  Radiopharmaceuticals:  Tc-48m DTPA aerosol and 6.0 mCi Tc-12m MAA.  Comparison: Chest radiograph 1 day prior  Findings:  Ventilation:  Heterogeneous tracer uptake, without convincing evidence of ventilation defect.  Perfusion:  Subtle area of decreased tracer within the right upper chest laterally.  Favored to correspond to fluid in the right minor fissure when correlated with prior radiograph.  Otherwise, no perfusion defect to suggest acute pulmonary embolism.  IMPRESSION: Very low probability for pulmonary embolism.   Original Report Authenticated By: Jeronimo Greaves, M.D.   Dg Chest Portable 1 View  03/23/2013   *RADIOLOGY REPORT*  Clinical Data: Shortness of breath.  PORTABLE CHEST - 1 VIEW  Comparison: 01/16/2013  Findings: Mildly degraded exam due to AP portable technique and patient body habitus.  Cardiomegaly accentuated by AP portable technique.  Small left greater than right bilateral pleural effusion. No pneumothorax. Low lung volumes.  Mild interstitial edema is accentuated.  Left greater than right bibasilar airspace disease. Artifact from support apparatus projects over the right upper lung.  IMPRESSION: Mild congestive heart failure.  Low lung volumes.  Small bilateral pleural effusions with adjacent atelectasis.  At the left lung base, early infection is difficult to exclude.   Original Report Authenticated By: Jeronimo Greaves, M.D.    Scheduled Meds: . albuterol  2.5 mg Nebulization Q4H  . amLODipine  5 mg Oral Daily  . azithromycin  500 mg Intravenous Q24H  . cefTRIAXone (ROCEPHIN) IVPB 1 gram/50 mL D5W  1 g Intravenous Q24H  . enoxaparin (LOVENOX) injection  1 mg/kg Subcutaneous Q12H  . fenofibrate  160 mg Oral Daily  . insulin aspart  0-15 Units Subcutaneous TID WC  . insulin aspart  3 Units Subcutaneous TID WC  .  ipratropium  0.5 mg Nebulization Q6H  . linagliptin  5 mg Oral Daily  . pantoprazole  40 mg Oral Daily  . potassium chloride  40 mEq Oral Daily  . predniSONE  40 mg Oral Q breakfast  . sodium chloride  3 mL Intravenous Q12H   Continuous Infusions:   Active Problems:   * No active hospital problems. *    Time spent: 25    [facility]  [facility] Pager [phone]. If 7PM-7AM, please contact night-coverage at [email or web address], password [facility] 03/25/2013, 8:53 AM  LOS: 2 days

## 2013-03-25 NOTE — Progress Notes (Signed)
Nutrition Brief Note  Patient identified on the Malnutrition Screening Tool (MST) Report for recent weight loss without trying.  Per weight readings below, patient's has been gaining weight.  Wt Readings from Last 15 Encounters:  03/25/13 236 lb 12.4 oz (107.4 kg)  01/19/13 220 lb 6.7 oz (99.98 kg)  05/14/12 221 lb 9 oz (100.5 kg)  08/30/11 203 lb 7.8 oz (92.3 kg)    Body mass index is 36.01 kg/(m^2). Patient meets criteria for Obesity Class II based on current BMI.   Current diet order is Carbohydrate Modified Medium Calorie, patient is consuming approximately 70-100% of meals at this time. Labs and medications reviewed.   No nutrition interventions warranted at this time. If nutrition issues arise, please consult RD.   Maureen Chatters, RD, LDN Pager #: [phone] After-Hours Pager #: [phone]

## 2013-03-25 NOTE — Progress Notes (Signed)
  Echocardiogram 2D Echocardiogram has been performed.  Jorje Guild 03/25/2013, 11:44 AM

## 2013-03-26 DIAGNOSIS — J811 Chronic pulmonary edema: Secondary | ICD-10-CM

## 2013-03-26 LAB — HEMOGLOBIN A1C: Mean Plasma Glucose: 148 mg/dL — ABNORMAL HIGH (ref <117)

## 2013-03-26 LAB — BASIC METABOLIC PANEL
BUN: 33 mg/dL — ABNORMAL HIGH (ref 6–23)
BUN: 33 mg/dL — ABNORMAL HIGH (ref 6–23)
CO2: 24 mEq/L (ref 19–32)
Chloride: 98 mEq/L (ref 96–112)
Chloride: 96 mEq/L (ref 96–112)
Creatinine, Ser: 1.79 mg/dL — ABNORMAL HIGH (ref 0.50–1.35)
GFR calc Af Amer: 40 mL/min — ABNORMAL LOW (ref >90)
GFR calc non Af Amer: 34 mL/min — ABNORMAL LOW (ref >90)
Glucose, Bld: 148 mg/dL — ABNORMAL HIGH (ref 70–99)
Potassium: 5.8 mEq/L — ABNORMAL HIGH (ref 3.5–5.1)
Potassium: 5 mEq/L (ref 3.5–5.1)
Sodium: 132 mEq/L — ABNORMAL LOW (ref 135–145)

## 2013-03-26 LAB — GLUCOSE, CAPILLARY: Glucose-Capillary: 157 mg/dL — ABNORMAL HIGH (ref 70–99)

## 2013-03-26 MED ORDER — AZITHROMYCIN 500 MG PO TABS
500.0000 mg | ORAL_TABLET | ORAL | Status: DC
  Filled 2013-03-26: qty 1

## 2013-03-26 MED ORDER — FUROSEMIDE 10 MG/ML IJ SOLN
20.0000 mg | Freq: Once | INTRAMUSCULAR | Status: AC
  Administered 2013-03-26: 20 mg via INTRAVENOUS
  Filled 2013-03-26: qty 2

## 2013-03-26 NOTE — Progress Notes (Signed)
While receiving report from off going report, we were alerted that the patient's left hand PIV infiltrated while IV antibiotics were infusing. His hand became edematous and painful. Md. Suanne Marker was notified and a warm compress was applied along with aromatherapy (lavendar). Patient had tylenol ordered but refused. Paged Md. Viyuoh to obtain a stronger pain medication for the patient's hand. Awaiting a return call from Md. Patient is alert and oriented to self. Believes his hand had been hurting all day from the infiltrated. Reoriented the patient that the infiltrated had taken place since my arrival onto the unit at 3pm.

## 2013-03-26 NOTE — Evaluation (Signed)
Physical Therapy Evaluation Patient Details Name: [name] [name] MRN: [id number] DOB: Sep 11, 1930 Today's Date: 03/26/2013 Time: 9147-8295 PT Time Calculation (min): 17 min  PT Assessment / Plan / Recommendation History of Present Illness  79y.o. African American male with a history of hypertension, diabetes, hyperlipidemia who presents to the emergency department with moderate to severe respiratory distress. Patient's significant other states that he has been having shortness of breath for the past 2-3 weeks. pulmonary edema  Clinical Impression  Pt with limited activity tolerance due to SOB with sats only as low as 91% on RA with gait with HR 104-108. Pt demonstrates difficulty with word finding at times, decreased safety awareness and inability to demonstrate complete self care. Pt with stool on bed and even with wash cloth and cueing pt did not complete pericare and required assist to complete. Will follow acutely to maximize mobility, safety awareness and function to decrease burden of care.     PT Assessment  Patient needs continued PT services    Follow Up Recommendations  Home health PT;Supervision/Assistance - 24 hour    Does the patient have the potential to tolerate intense rehabilitation      Barriers to Discharge        Equipment Recommendations  None recommended by PT    Recommendations for Other Services     Frequency Min 3X/week    Precautions / Restrictions Precautions Precautions: Fall   Pertinent Vitals/Pain 91-94% no RA with mobility, 94% at rest on 2L on arrival HR 104-108 No pain     Mobility  Bed Mobility Bed Mobility: Supine to Sit Supine to Sit: 6: Modified independent (Device/Increase time);With rails;HOB flat Transfers Transfers: Sit to Stand;Stand to Sit Sit to Stand: 5: Supervision;From bed Stand to Sit: 5: Supervision;To chair/3-in-1 Details for Transfer Assistance: supervision for safety Ambulation/Gait Ambulation/Gait Assistance: 5:  Supervision Ambulation Distance (Feet): 90 Feet Assistive device: None Ambulation/Gait Assistance Details: pt unable to ambulate further due to SOb however reports at baseline he can walk to the store to get groceries and other items Gait Pattern: Within Functional Limits Gait velocity: WFL Stairs: No    Exercises     PT Diagnosis: Difficulty walking  PT Problem List: Decreased cognition;Decreased safety awareness;Cardiopulmonary status limiting activity PT Treatment Interventions: Gait training;Functional mobility training;Therapeutic activities;Therapeutic exercise;Patient/family education;Stair training     PT Goals(Current goals can be found in the care plan section) Acute Rehab PT Goals Patient Stated Goal: go home PT Goal Formulation: With patient Time For Goal Achievement: 04/09/13 Potential to Achieve Goals: Fair  Visit Information  Last PT Received On: 03/26/13 Assistance Needed: +1 History of Present Illness: 79y.o. African American male with a history of hypertension, diabetes, hyperlipidemia who presents to the emergency department with moderate to severe respiratory distress. Patient's significant other states that he has been having shortness of breath for the past 2-3 weeks. 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 and pt does the housework and cooking Communication Communication: No difficulties    Cognition  Cognition Arousal/Alertness: Awake/alert Behavior During Therapy: WFL for tasks assessed/performed Overall Cognitive Status: Impaired/Different from baseline Area of  Impairment: Orientation;Memory;Safety/judgement;Problem solving Orientation Level: Disoriented to;Time  Safety/Judgement: Decreased awareness of safety Problem Solving: Slow processing General Comments: pt unable to recall number for "911", pt not using call bell for mobility although instructed to do so, pt unaware of year or month    Extremity/Trunk Assessment Upper Extremity Assessment Upper Extremity Assessment: Overall WFL for tasks assessed Lower Extremity Assessment Lower Extremity Assessment: Overall WFL for tasks assessed Cervical / Trunk Assessment Cervical / Trunk Assessment: Normal   Balance    End of Session PT - End of Session Activity Tolerance: Patient limited by fatigue Patient left: in chair;with call bell/phone within reach Nurse Communication: Mobility status  GP     Toney Sang Beth 03/26/2013, 1:18 PM Delaney Meigs, PT [phone]

## 2013-03-26 NOTE — Progress Notes (Addendum)
[facility] PROGRESS NOTE  [name] [name] ZOX:[id number] DOB: [birth year] DOA: 03/23/2013 PCP: Alva Garnet., MD  Assessment/Plan: Active Problems:  Lung infiltrates- CAP vs CHF  -continue empiric abx with zithromax and rocephin  -CXR 8/11 with improved infiltrates, increasing wbc noted 8/10 was likely due to steroids - pt clinically improving  -was given lasix in ED on admission with Cr bump on follow up and still trending up today-1.87, which is close to his baseline of 2 per review of his old records.  -VQ scan very low prob, CEs neg x3  -with EF 50-55%,elevated BNP >12000, pulm & peripheral edema On admit-likely component of acute diastolic CHF -will give low dose of IV lasix today. Start on oral lasix in am with close monitoring of renal function -mobilize>>consult PT/OT Hypokalemia  -repleted, 2/2 lasix Probable COPD exacerbation  -though no h/o COPD was a smoker and quit 30yrs agoo  -follow outpt with PCP for PFTs  -Continue steroids-prednisone, follow and taper. -continue bronchodilators  HTN  -on norvasc in am  DM  -continue SSI and oral hypoglycemic  Hyponatremia  -Improved off lasix  CKD -he does have a history of CkD stage III as creatinine at discharge in June 2014 was 2.3( base line cr was noted to be 2)  -cr trending down today, monitor closely on lasix Elevated k -repeat and further treat as appropriate if still elevated Code Status: full Family Communication:none at bedside Disposition Plan: pending PT eval   Consultants:  none  Procedures:  Echo Study Conclusions  - Left ventricle: The cavity size was normal. Wall thickness was increased in a pattern of mild LVH. There was moderate focal basal hypertrophy of the septum. Systolic function was normal. The estimated ejection fraction was in the range of 50% to 55%. Wall motion was normal; there were no regional wall motion abnormalities. - Aortic valve: Trivial regurgitation. - Mitral valve:  Calcified annulus. Moderate regurgitation. - Left atrium: The atrium was mildly dilated. - Pulmonary arteries: Systolic pressure was moderately increased. PA peak pressure:   Antibiotics:  Rocephin and zithromax started 8/10  HPI/Subjective: breathing better this a.m., Denies chest pain  Objective: Filed Vitals:   03/26/13 0441  BP: 174/82  Pulse: 97  Temp: 98 F (36.7 C)  Resp: 22    Intake/Output Summary (Last 24 hours) at 03/26/13 1028 Last data filed at 03/26/13 8119  Gross per 24 hour  Intake    240 ml  Output    575 ml  Net   -335 ml   Filed Weights   03/23/13 1651 03/25/13 0600  Weight: 106.8 kg (235 lb 7.2 oz) 107.4 kg (236 lb 12.4 oz)   Exam:  General: alert & oriented x 3 In NAD Cardiovascular: RRR, nl S1 s2  Respiratory: clear, no wheezes, no crackles  Abdomen: soft +BS NT/ND, no masses palpable  Extremities: No cyanosis and trace to +1 edema     Data Reviewed: Basic Metabolic Panel:  Recent Labs Lab 03/23/13 1340 03/24/13 0410 03/25/13 0500 03/26/13 0617  NA 131* 128* 132* 131*  K 3.0* 3.1* 4.7 5.8*  CL 94* 93* 98 98  CO2 19 21 21 22   GLUCOSE 228* 342* 226* 230*  BUN 17 25* 35* 33*  CREATININE 1.33 1.76* 1.87* 1.79*  CALCIUM 8.7 8.4 8.6 8.5   Liver Function Tests:  Recent Labs Lab 03/23/13 1608 03/24/13 0410  AST 63* 68*  ALT 23 23  ALKPHOS 39 31*  BILITOT 1.3* 0.6  PROT 7.8 7.0  ALBUMIN  3.6 3.1*   No results found for this basename: LIPASE, AMYLASE,  in the last 168 hours No results found for this basename: AMMONIA,  in the last 168 hours CBC:  Recent Labs Lab 03/23/13 1340 03/24/13 0410  WBC 12.1* 15.7*  NEUTROABS 10.1*  --   HGB 11.2* 10.1*  HCT 32.9* 28.9*  MCV 93.5 91.2  PLT 291 325   Cardiac Enzymes:  Recent Labs Lab 03/23/13 1608 03/23/13 2206 03/24/13 0410  TROPONINI <0.30 <0.30 <0.30   BNP (last 3 results)  Recent Labs  03/23/13 1340  PROBNP 12032.0*   CBG:  Recent Labs Lab 03/25/13 1209  03/25/13 1535 03/25/13 2013 03/25/13 2105 03/26/13 0748  GLUCAP 189* 85 168* 185* 201*    Recent Results (from the past 240 hour(s))  CULTURE, BLOOD (ROUTINE X 2)     Status: None   Collection Time    03/23/13  3:00 PM      Result Value Range Status   Specimen Description BLOOD RIGHT ARM   Final   Special Requests BOTTLES DRAWN AEROBIC ONLY 10CC   Final   Culture  Setup Time     Final   Value: 03/24/2013 01:32     Performed at [facility]   Culture     Final   Value:        BLOOD CULTURE RECEIVED NO GROWTH TO DATE CULTURE WILL BE HELD FOR 5 DAYS BEFORE ISSUING A FINAL NEGATIVE REPORT     Performed at [facility]   Report Status PENDING   Incomplete  CULTURE, BLOOD (ROUTINE X 2)     Status: None   Collection Time    03/23/13  3:10 PM      Result Value Range Status   Specimen Description BLOOD LEFT ARM   Final   Special Requests BOTTLES DRAWN AEROBIC ONLY [facility]   Final   Culture  Setup Time     Final   Value: 03/24/2013 01:32     Performed at [facility]   Culture     Final   Value:        BLOOD CULTURE RECEIVED NO GROWTH TO DATE CULTURE WILL BE HELD FOR 5 DAYS BEFORE ISSUING A FINAL NEGATIVE REPORT     Performed at [facility]   Report Status PENDING   Incomplete  MRSA PCR SCREENING     Status: None   Collection Time    03/23/13  5:23 PM      Result Value Range Status   MRSA by PCR NEGATIVE  NEGATIVE Final   Comment:            The GeneXpert MRSA Assay (FDA     approved for NASAL specimens     only), is one component of a     comprehensive MRSA colonization     surveillance program. It is not     intended to diagnose MRSA     infection nor to guide or     monitor treatment for     MRSA infections.  CULTURE, BLOOD (ROUTINE X 2)     Status: None   Collection Time    03/23/13  7:00 PM      Result Value Range Status   Specimen Description BLOOD RIGHT HAND   Final   Special Requests BOTTLES DRAWN AEROBIC AND ANAEROBIC 10CC EA   Final    Culture  Setup Time     Final   Value: 03/24/2013 01:33  Performed at [facility]     Final   Value:        BLOOD CULTURE RECEIVED NO GROWTH TO DATE CULTURE WILL BE HELD FOR 5 DAYS BEFORE ISSUING A FINAL NEGATIVE REPORT     Performed at [facility]   Report Status PENDING   Incomplete  CULTURE, BLOOD (ROUTINE X 2)     Status: None   Collection Time    03/23/13  7:20 PM      Result Value Range Status   Specimen Description BLOOD LEFT HAND   Final   Special Requests BOTTLES DRAWN AEROBIC AND ANAEROBIC 10CC EA   Final   Culture  Setup Time     Final   Value: 03/24/2013 01:33     Performed at [facility]   Culture     Final   Value:        BLOOD CULTURE RECEIVED NO GROWTH TO DATE CULTURE WILL BE HELD FOR 5 DAYS BEFORE ISSUING A FINAL NEGATIVE REPORT     Performed at [facility]   Report Status PENDING   Incomplete     Studies: Dg Chest 2 View  03/25/2013   *RADIOLOGY REPORT*  Clinical Data: Weakness, shortness of breath, wheezing and cough. Follow up infiltrates.  CHEST - 2 VIEW  Comparison: 03/23/2013 and 01/16/2013.  Findings: There is stable cardiomegaly.  Pulmonary edema, bilateral pleural effusions and associated bibasilar opacities have all improved.  There is no consolidation or pneumothorax.  The pleural fluid may be partially loculated laterally in both hemithoraces.  IMPRESSION: Improving congestive heart failure with decreased edema and pleural effusions.   Original Report Authenticated By: Carey Bullocks, M.D.   Nm Pulmonary Perf And Vent  03/24/2013   *RADIOLOGY REPORT*  Clinical Data:  Hypertension.  Shortness of breath.  Abnormal D- dimer.  NUCLEAR MEDICINE VENTILATION - PERFUSION LUNG SCAN  Technique:  Ventilation images were obtained in multiple projections using inhaled aerosol technetium 99 M DTPA.  Perfusion images were obtained in multiple projections after intravenous injection of Tc-17m MAA.  Radiopharmaceuticals:   Tc-46m DTPA aerosol and 6.0 mCi Tc-85m MAA.  Comparison: Chest radiograph 1 day prior  Findings:  Ventilation:  Heterogeneous tracer uptake, without convincing evidence of ventilation defect.  Perfusion:  Subtle area of decreased tracer within the right upper chest laterally.  Favored to correspond to fluid in the right minor fissure when correlated with prior radiograph.  Otherwise, no perfusion defect to suggest acute pulmonary embolism.  IMPRESSION: Very low probability for pulmonary embolism.   Original Report Authenticated By: Jeronimo Greaves, M.D.    Scheduled Meds: . albuterol  2.5 mg Nebulization Q6H  . amLODipine  5 mg Oral Daily  . azithromycin  500 mg Intravenous Q24H  . cefTRIAXone (ROCEPHIN) IVPB 1 gram/50 mL D5W  1 g Intravenous Q24H  . enoxaparin (LOVENOX) injection  1 mg/kg Subcutaneous Q12H  . fenofibrate  160 mg Oral Daily  . furosemide  20 mg Intravenous Once  . insulin aspart  0-15 Units Subcutaneous TID WC  . insulin aspart  3 Units Subcutaneous TID WC  . ipratropium  0.5 mg Nebulization Q6H  . linagliptin  5 mg Oral Daily  . pantoprazole  40 mg Oral Daily  . potassium chloride  40 mEq Oral Daily  . predniSONE  40 mg Oral Q breakfast  . sodium chloride  3 mL Intravenous Q12H   Continuous Infusions:   Active Problems:   * No  active hospital problems. *    Time spent: 25    [facility]  [facility] Pager [phone]. If 7PM-7AM, please contact night-coverage at [email or web address], password [facility] 03/26/2013, 10:28 AM  LOS: 3 days

## 2013-03-26 NOTE — Plan of Care (Signed)
Problem: Phase II Progression Outcomes Goal: Walk in hall or up in chair TID Outcome: Completed/Met Date Met:  03/26/13 Walked in hall wit PT and sat up in chair & on side of bed today

## 2013-03-27 DIAGNOSIS — I509 Heart failure, unspecified: Secondary | ICD-10-CM

## 2013-03-27 DIAGNOSIS — I5033 Acute on chronic diastolic (congestive) heart failure: Principal | ICD-10-CM

## 2013-03-27 DIAGNOSIS — I10 Essential (primary) hypertension: Secondary | ICD-10-CM

## 2013-03-27 LAB — GLUCOSE, CAPILLARY
Glucose-Capillary: 163 mg/dL — ABNORMAL HIGH (ref 70–99)
Glucose-Capillary: 180 mg/dL — ABNORMAL HIGH (ref 70–99)
Glucose-Capillary: 139 mg/dL — ABNORMAL HIGH (ref 70–99)

## 2013-03-27 LAB — CBC
HCT: 31.2 % — ABNORMAL LOW (ref 39.0–52.0)
Hemoglobin: 10.8 g/dL — ABNORMAL LOW (ref 13.0–17.0)
MCH: 32.3 pg (ref 26.0–34.0)
MCHC: 34.6 g/dL (ref 30.0–36.0)
MCV: 93.4 fL (ref 78.0–100.0)
RBC: 3.34 MIL/uL — ABNORMAL LOW (ref 4.22–5.81)

## 2013-03-27 LAB — BASIC METABOLIC PANEL
BUN: 30 mg/dL — ABNORMAL HIGH (ref 6–23)
CO2: 25 mEq/L (ref 19–32)
Chloride: 96 mEq/L (ref 96–112)
GFR calc non Af Amer: 34 mL/min — ABNORMAL LOW (ref >90)
Glucose, Bld: 164 mg/dL — ABNORMAL HIGH (ref 70–99)
Potassium: 4.3 mEq/L (ref 3.5–5.1)
Sodium: 133 mEq/L — ABNORMAL LOW (ref 135–145)

## 2013-03-27 MED ORDER — ENOXAPARIN SODIUM 40 MG/0.4ML ~~LOC~~ SOLN
40.0000 mg | SUBCUTANEOUS | Status: DC
  Filled 2013-03-27: qty 0.4

## 2013-03-27 MED ORDER — FUROSEMIDE 40 MG PO TABS
40.0000 mg | ORAL_TABLET | Freq: Every day | ORAL | Status: DC
  Administered 2013-03-27 – 2013-03-28 (×2): 40 mg via ORAL
  Filled 2013-03-27 (×2): qty 1

## 2013-03-27 MED ORDER — PREDNISONE 20 MG PO TABS
30.0000 mg | ORAL_TABLET | Freq: Every day | ORAL | Status: DC
  Administered 2013-03-28: 30 mg via ORAL
  Filled 2013-03-27 (×2): qty 1

## 2013-03-27 NOTE — Progress Notes (Signed)
OT Cancellation Note  Patient Details Name: [name] [name] MRN: [id number] DOB: [birth year]   Cancelled Treatment:    Reason Eval/Treat Not Completed: Patient declined, no reason specified. Will re-attempt next date.  03/27/2013 Cipriano Mile OTR/L Pager [phone] Office [phone]

## 2013-03-27 NOTE — Progress Notes (Signed)
[facility] PROGRESS NOTE  [name] [name] ZOX:[id number] DOB: Jan 28, 1931 DOA: 03/23/2013 PCP: Alva Garnet., MD  Assessment/Plan: Acute on Chronic Diastolic CHF -Is 1.5 L negative since admission. -ECHO: Study Conclusions: Left ventricle: The cavity size was normal. Wall thickness was increased in a pattern of mild LVH. There was moderate focal basal hypertrophy of the septum. Systolic function was normal. The estimated ejection fraction was in the range of 50% to 55%. Wall motion was normal; there were no regional wall motion abnormalities. -Will place on lasix 40 mg PO daily. -Still appears volume overloaded on exam. -Has been requiring oxygen. -Have asked RN to document resting and ambulating oxygen sats. -SOB is much improved since admission. -Repeat CXR without indication for PNA so will DC abx. -No wheezing: doubt COPD exacerbation (no h/o COPD): will start steroid titration.  Weakness/Deconditioning -PT recs: HHPT with 24 hour supervision. -He does not have 24 hour supervision and refuses SNF. -On initial conversation, he appears to be competent. Will continue to assess competency. -He does agree to having HHPT arranged.  CKD Stage III -At baseline.  HTN -Fair control. -Continue current regimen.  DM II -CBGs remain elevated. -Suspect will start to decrease as we titrate steroids.   Code Status: Full Code Family Communication: Patient only  Disposition Plan: Likely home when ready as he is refusing SNF.   Consultants:  None   Antibiotics:  None   Subjective: No complaints. Feels that his SOB has improved since admission.  Objective: Filed Vitals:   03/26/13 2149 03/27/13 0549 03/27/13 0745 03/27/13 1143  BP: 175/81 154/71  134/50  Pulse: 96 88    Temp: 97.5 F (36.4 C) 97.7 F (36.5 C)    TempSrc: Oral Oral    Resp: 20 18    Height:      Weight:      SpO2: 96% 96% 97%     Intake/Output Summary (Last 24 hours) at 03/27/13  1206 Last data filed at 03/27/13 0700  Gross per 24 hour  Intake    460 ml  Output    650 ml  Net   -190 ml   Filed Weights   03/23/13 1651 03/25/13 0600  Weight: 106.8 kg (235 lb 7.2 oz) 107.4 kg (236 lb 12.4 oz)    Exam:   General:  AA, NAD  Cardiovascular: RRR  Respiratory: CTA B, minimal basilar crackles  Abdomen: S/NT/ND/+BS  Extremities: 2+edema bilaterally   Neurologic:  Grossly intact and non-focal.  Data Reviewed: Basic Metabolic Panel:  Recent Labs Lab 03/24/13 0410 03/25/13 0500 03/26/13 0617 03/26/13 1043 03/27/13 0640  NA 128* 132* 131* 132* 133*  K 3.1* 4.7 5.8* 5.0 4.3  CL 93* 98 98 96 96  CO2 21 21 22 24 25   GLUCOSE 342* 226* 230* 148* 164*  BUN 25* 35* 33* 33* 30*  CREATININE 1.76* 1.87* 1.79* 1.79* 1.78*  CALCIUM 8.4 8.6 8.5 8.7 8.9   Liver Function Tests:  Recent Labs Lab 03/23/13 1608 03/24/13 0410  AST 63* 68*  ALT 23 23  ALKPHOS 39 31*  BILITOT 1.3* 0.6  PROT 7.8 7.0  ALBUMIN 3.6 3.1*   No results found for this basename: LIPASE, AMYLASE,  in the last 168 hours No results found for this basename: AMMONIA,  in the last 168 hours CBC:  Recent Labs Lab 03/23/13 1340 03/24/13 0410 03/27/13 0640  WBC 12.1* 15.7* 16.9*  NEUTROABS 10.1*  --   --   HGB 11.2* 10.1* 10.8*  HCT 32.9* 28.9* 31.2*  MCV 93.5 91.2 93.4  PLT 291 325 384   Cardiac Enzymes:  Recent Labs Lab 03/23/13 1608 03/23/13 2206 03/24/13 0410  TROPONINI <0.30 <0.30 <0.30   BNP (last 3 results)  Recent Labs  03/23/13 1340  PROBNP 12032.0*   CBG:  Recent Labs Lab 03/26/13 0748 03/26/13 1228 03/26/13 1711 03/26/13 2146 03/27/13 0802  GLUCAP 201* 136* 157* 186* 163*    Recent Results (from the past 240 hour(s))  CULTURE, BLOOD (ROUTINE X 2)     Status: None   Collection Time    03/23/13  3:00 PM      Result Value Range Status   Specimen Description BLOOD RIGHT ARM   Final   Special Requests BOTTLES DRAWN AEROBIC ONLY 10CC   Final    Culture  Setup Time     Final   Value: 03/24/2013 01:32     Performed at [facility]   Culture     Final   Value:        BLOOD CULTURE RECEIVED NO GROWTH TO DATE CULTURE WILL BE HELD FOR 5 DAYS BEFORE ISSUING A FINAL NEGATIVE REPORT     Performed at [facility]   Report Status PENDING   Incomplete  CULTURE, BLOOD (ROUTINE X 2)     Status: None   Collection Time    03/23/13  3:10 PM      Result Value Range Status   Specimen Description BLOOD LEFT ARM   Final   Special Requests BOTTLES DRAWN AEROBIC ONLY [facility]   Final   Culture  Setup Time     Final   Value: 03/24/2013 01:32     Performed at [facility]   Culture     Final   Value:        BLOOD CULTURE RECEIVED NO GROWTH TO DATE CULTURE WILL BE HELD FOR 5 DAYS BEFORE ISSUING A FINAL NEGATIVE REPORT     Performed at [facility]   Report Status PENDING   Incomplete  MRSA PCR SCREENING     Status: None   Collection Time    03/23/13  5:23 PM      Result Value Range Status   MRSA by PCR NEGATIVE  NEGATIVE Final   Comment:            The GeneXpert MRSA Assay (FDA     approved for NASAL specimens     only), is one component of a     comprehensive MRSA colonization     surveillance program. It is not     intended to diagnose MRSA     infection nor to guide or     monitor treatment for     MRSA infections.  CULTURE, BLOOD (ROUTINE X 2)     Status: None   Collection Time    03/23/13  7:00 PM      Result Value Range Status   Specimen Description BLOOD RIGHT HAND   Final   Special Requests BOTTLES DRAWN AEROBIC AND ANAEROBIC 10CC EA   Final   Culture  Setup Time     Final   Value: 03/24/2013 01:33     Performed at [facility]   Culture     Final   Value:        BLOOD CULTURE RECEIVED NO GROWTH TO DATE CULTURE WILL BE HELD FOR 5 DAYS BEFORE ISSUING A FINAL NEGATIVE REPORT     Performed at [facility]   Report Status  PENDING   Incomplete  CULTURE, BLOOD (ROUTINE X 2)     Status: None    Collection Time    03/23/13  7:20 PM      Result Value Range Status   Specimen Description BLOOD LEFT HAND   Final   Special Requests BOTTLES DRAWN AEROBIC AND ANAEROBIC 10CC EA   Final   Culture  Setup Time     Final   Value: 03/24/2013 01:33     Performed at [facility]   Culture     Final   Value:        BLOOD CULTURE RECEIVED NO GROWTH TO DATE CULTURE WILL BE HELD FOR 5 DAYS BEFORE ISSUING A FINAL NEGATIVE REPORT     Performed at [facility]   Report Status PENDING   Incomplete     Studies: No results found.  Scheduled Meds: . albuterol  2.5 mg Nebulization Q6H  . amLODipine  5 mg Oral Daily  . azithromycin  500 mg Oral Q24H  . cefTRIAXone (ROCEPHIN) IVPB 1 gram/50 mL D5W  1 g Intravenous Q24H  . enoxaparin (LOVENOX) injection  1 mg/kg Subcutaneous Q12H  . fenofibrate  160 mg Oral Daily  . insulin aspart  0-15 Units Subcutaneous TID WC  . insulin aspart  3 Units Subcutaneous TID WC  . ipratropium  0.5 mg Nebulization Q6H  . linagliptin  5 mg Oral Daily  . pantoprazole  40 mg Oral Daily  . potassium chloride  40 mEq Oral Daily  . predniSONE  40 mg Oral Q breakfast  . sodium chloride  3 mL Intravenous Q12H   Continuous Infusions:   Principal Problem:   Acute on chronic diastolic CHF (congestive heart failure) Active Problems:   CKD (chronic kidney disease), stage III    Time spent: 40 minutes.    Chaya Jan  [facility] Pager [phone]  If 7PM-7AM, please contact night-coverage at [email or web address], password [facility] 03/27/2013, 12:06 PM  LOS: 4 days

## 2013-03-28 LAB — BASIC METABOLIC PANEL
CO2: 20 mEq/L (ref 19–32)
Chloride: 93 mEq/L — ABNORMAL LOW (ref 96–112)
Glucose, Bld: 215 mg/dL — ABNORMAL HIGH (ref 70–99)
Sodium: 126 mEq/L — ABNORMAL LOW (ref 135–145)

## 2013-03-28 LAB — GLUCOSE, CAPILLARY
Glucose-Capillary: 121 mg/dL — ABNORMAL HIGH (ref 70–99)
Glucose-Capillary: 248 mg/dL — ABNORMAL HIGH (ref 70–99)

## 2013-03-28 LAB — CBC
MCH: 32 pg (ref 26.0–34.0)
MCHC: 33.9 g/dL (ref 30.0–36.0)
MCV: 94.2 fL (ref 78.0–100.0)
Platelets: 355 10*3/uL (ref 150–400)
RBC: 3.47 MIL/uL — ABNORMAL LOW (ref 4.22–5.81)

## 2013-03-28 MED ORDER — ALBUTEROL SULFATE HFA 108 (90 BASE) MCG/ACT IN AERS
2.0000 | INHALATION_SPRAY | Freq: Two times a day (BID) | RESPIRATORY_TRACT | Status: DC
  Filled 2013-03-28: qty 6.7

## 2013-03-28 MED ORDER — PREDNISONE (PAK) 10 MG PO TABS: 10.0000 mg | ORAL_TABLET | Freq: Every day | ORAL | Status: DC

## 2013-03-28 MED ORDER — LISINOPRIL 2.5 MG PO TABS: 2.5000 mg | ORAL_TABLET | Freq: Every day | ORAL | Status: DC

## 2013-03-28 MED ORDER — ALBUTEROL SULFATE HFA 108 (90 BASE) MCG/ACT IN AERS: 2.0000 | INHALATION_SPRAY | RESPIRATORY_TRACT | Status: DC | PRN

## 2013-03-28 MED ORDER — FUROSEMIDE 40 MG PO TABS: 40.0000 mg | ORAL_TABLET | Freq: Every day | ORAL | Status: DC
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
## 2013-03-28 NOTE — Progress Notes (Signed)
PT Cancellation Note  Patient Details Name: [name] [name] MRN: [id number] DOB: 1930/11/02   Cancelled Treatment:    Reason Eval/Treat Not Completed: Patient declined therapy services, reporting that he can walk fine and does not want to work with physical therapy. Pt reports that he spoke with family and someone will be available to stay with him 24 hrs at d/c.   Ruthann Cancer 03/28/2013, 10:47 AM  Ruthann Cancer, PT Acute Rehabilitation Services

## 2013-03-28 NOTE — Discharge Summary (Signed)
Physician Discharge Summary  [name] [name] GNF:[id number] DOB: 06-Mar-1931 DOA: 03/23/2013  PCP: Alva Garnet., MD  Admit date: 03/23/2013 Discharge date: 03/28/2013  Time spent: 45 minutes  Recommendations for Outpatient Follow-up:  -Advised to follow up with PCP in 2weeks. -At time of followup, would check a BMET to follow up on renal function and electrolytes on newly started lasix and ACE-I. -SNF was recommended by PT, however he refused and hence will be set up with [facility] therapies.   Discharge Diagnoses:  Principal Problem:   Acute on chronic diastolic CHF (congestive heart failure) Active Problems:   CKD (chronic kidney disease), stage III   HTN (hypertension)   Type II or unspecified type diabetes mellitus without mention of complication, uncontrolled   Discharge Condition: Stable and improved.  Filed Weights   03/23/13 1651 03/25/13 0600 03/28/13 0513  Weight: 106.8 kg (235 lb 7.2 oz) 107.4 kg (236 lb 12.4 oz) 107.049 kg (236 lb)    History of present illness:  Patient is an 79y.o. African American male with a history of hypertension, diabetes, hyperlipidemia who presents to the emergency department with moderate to severe respiratory distress. Patient's significant other states that he has been having shortness of breath for the past 2-3 weeks. Patient uses portable of intact when he needs it. He is unable to quantify how much oxygen he uses. Today he woke up and was extremely short of breath. EMS, patient's oxygen saturation was 86% on room air and he was tripoding, tachypneic and had accessory muscle use. Denies any fever at home.  In the ED he received 50 mg of albuterol and one Atrovent, 125 mg of Solu-Medrol and 2 g of IV magnesium prior to arrival. Immediately in the resuscitation bay, BiPAP was initiated for respiratory distress with continuous albuterol and 2 more Atrovent treatments. Patient reports that he is a full code. He denies any fever and has had mild cough  without sputum production. Denies a prior history of PE or DVT. No history of CHF. He has been a smoker for approximately 20 years but quit at age 63. No prior history of COPD or asthma. He denies any chest pain. We were asked to admit him for further evaluation and management.   Hospital Course:   Acute on Chronic Diastolic CHF  -Is 1.5 L negative since admission.  -ECHO: Study Conclusions: Left ventricle: The cavity size was normal. Wall thickness was increased in a pattern of mild LVH. There was moderate focal basal hypertrophy of the septum. Systolic function was normal. The estimated ejection fraction was in the range of 50% to 55%. Wall motion was normal; there were no regional wall motion abnormalities.  -Has been placed on 40 mg of lasix daily as well as a low dose ACE-I. -Has been requiring oxygen.  -No longer has any oxygen requirements. -SOB has completely resolved.  -Repeat CXR without indication for PNA sowill not be discharged on antibiotics. -No wheezing: doubt COPD exacerbation (no h/o COPD): will start steroid titration.   Weakness/Deconditioning  -Has refused SNF, so HH has been set up.  CKD Stage III  -At baseline.   HTN  -Fair control.  -Continue current regimen.   DM II -CBGs remain elevated.  -Suspect will start to decrease as we titrate steroids.   Procedures:  None   Consultations:  None  Discharge Instructions  Discharge Orders   Future Orders Complete By Expires   Diet - low sodium heart healthy  As directed    Discontinue IV  As directed    Increase activity slowly  As directed        Medication List         amLODipine 5 MG tablet  Commonly known as:  NORVASC  Take 5 mg by mouth daily.     fenofibrate 160 MG tablet  Take 160 mg by mouth daily.     furosemide 40 MG tablet  Commonly known as:  LASIX  Take 1 tablet (40 mg total) by mouth daily.     linagliptin 5 MG Tabs tablet  Commonly known as:  TRADJENTA  Take 1 tablet  (5 mg total) by mouth daily.     predniSONE 10 MG tablet  Commonly known as:  STERAPRED UNI-PAK  Take 1 tablet (10 mg total) by mouth daily. Take as directed     Vitamin D (Ergocalciferol) 50000 UNITS Caps capsule  Commonly known as:  DRISDOL  Take 50,000 Units by mouth every 7 (seven) days. Takes on Tuesday       No Known Allergies     Follow-up Information   Follow up with Alva Garnet., MD. Schedule an appointment as soon as possible for a visit in 1 week.   Specialty:  Internal Medicine   Contact information:   [address] [place] [address] [phone]        The results of significant diagnostics from this hospitalization (including imaging, microbiology, ancillary and laboratory) are listed below for reference.    Significant Diagnostic Studies: Dg Chest 2 View  03/25/2013   *RADIOLOGY REPORT*  Clinical Data: Weakness, shortness of breath, wheezing and cough. Follow up infiltrates.  CHEST - 2 VIEW  Comparison: 03/23/2013 and 01/16/2013.  Findings: There is stable cardiomegaly.  Pulmonary edema, bilateral pleural effusions and associated bibasilar opacities have all improved.  There is no consolidation or pneumothorax.  The pleural fluid may be partially loculated laterally in both hemithoraces.  IMPRESSION: Improving congestive heart failure with decreased edema and pleural effusions.   Original Report Authenticated By: Carey Bullocks, M.D.   Nm Pulmonary Perf And Vent  03/24/2013   *RADIOLOGY REPORT*  Clinical Data:  Hypertension.  Shortness of breath.  Abnormal D- dimer.  NUCLEAR MEDICINE VENTILATION - PERFUSION LUNG SCAN  Technique:  Ventilation images were obtained in multiple projections using inhaled aerosol technetium 99 M DTPA.  Perfusion images were obtained in multiple projections after intravenous injection of Tc-7m MAA.  Radiopharmaceuticals:  Tc-13m DTPA aerosol and 6.0 mCi Tc-13m MAA.  Comparison: Chest radiograph 1 day prior   Findings:  Ventilation:  Heterogeneous tracer uptake, without convincing evidence of ventilation defect.  Perfusion:  Subtle area of decreased tracer within the right upper chest laterally.  Favored to correspond to fluid in the right minor fissure when correlated with prior radiograph.  Otherwise, no perfusion defect to suggest acute pulmonary embolism.  IMPRESSION: Very low probability for pulmonary embolism.   Original Report Authenticated By: Jeronimo Greaves, M.D.   Dg Chest Portable 1 View  03/23/2013   *RADIOLOGY REPORT*  Clinical Data: Shortness of breath.  PORTABLE CHEST - 1 VIEW  Comparison: 01/16/2013  Findings: Mildly degraded exam due to AP portable technique and patient body habitus.  Cardiomegaly accentuated by AP portable technique.  Small left greater than right bilateral pleural effusion. No pneumothorax. Low lung volumes.  Mild interstitial edema is accentuated.  Left greater than right bibasilar airspace disease. Artifact from support apparatus projects over the right upper lung.  IMPRESSION: Mild congestive heart failure.  Low  lung volumes.  Small bilateral pleural effusions with adjacent atelectasis.  At the left lung base, early infection is difficult to exclude.   Original Report Authenticated By: Jeronimo Greaves, M.D.    Microbiology: Recent Results (from the past 240 hour(s))  CULTURE, BLOOD (ROUTINE X 2)     Status: None   Collection Time    03/23/13  3:00 PM      Result Value Range Status   Specimen Description BLOOD RIGHT ARM   Final   Special Requests BOTTLES DRAWN AEROBIC ONLY 10CC   Final   Culture  Setup Time     Final   Value: 03/24/2013 01:32     Performed at [facility]   Culture     Final   Value:        BLOOD CULTURE RECEIVED NO GROWTH TO DATE CULTURE WILL BE HELD FOR 5 DAYS BEFORE ISSUING A FINAL NEGATIVE REPORT     Performed at [facility]   Report Status PENDING   Incomplete  CULTURE, BLOOD (ROUTINE X 2)     Status: None   Collection Time     03/23/13  3:10 PM      Result Value Range Status   Specimen Description BLOOD LEFT ARM   Final   Special Requests BOTTLES DRAWN AEROBIC ONLY [facility]   Final   Culture  Setup Time     Final   Value: 03/24/2013 01:32     Performed at [facility]   Culture     Final   Value:        BLOOD CULTURE RECEIVED NO GROWTH TO DATE CULTURE WILL BE HELD FOR 5 DAYS BEFORE ISSUING A FINAL NEGATIVE REPORT     Performed at [facility]   Report Status PENDING   Incomplete  MRSA PCR SCREENING     Status: None   Collection Time    03/23/13  5:23 PM      Result Value Range Status   MRSA by PCR NEGATIVE  NEGATIVE Final   Comment:            The GeneXpert MRSA Assay (FDA     approved for NASAL specimens     only), is one component of a     comprehensive MRSA colonization     surveillance program. It is not     intended to diagnose MRSA     infection nor to guide or     monitor treatment for     MRSA infections.  CULTURE, BLOOD (ROUTINE X 2)     Status: None   Collection Time    03/23/13  7:00 PM      Result Value Range Status   Specimen Description BLOOD RIGHT HAND   Final   Special Requests BOTTLES DRAWN AEROBIC AND ANAEROBIC 10CC EA   Final   Culture  Setup Time     Final   Value: 03/24/2013 01:33     Performed at [facility]   Culture     Final   Value:        BLOOD CULTURE RECEIVED NO GROWTH TO DATE CULTURE WILL BE HELD FOR 5 DAYS BEFORE ISSUING A FINAL NEGATIVE REPORT     Performed at [facility]   Report Status PENDING   Incomplete  CULTURE, BLOOD (ROUTINE X 2)     Status: None   Collection Time    03/23/13  7:20 PM      Result Value Range  Status   Specimen Description BLOOD LEFT HAND   Final   Special Requests BOTTLES DRAWN AEROBIC AND ANAEROBIC 10CC EA   Final   Culture  Setup Time     Final   Value: 03/24/2013 01:33     Performed at [facility]   Culture     Final   Value:        BLOOD CULTURE RECEIVED NO GROWTH TO DATE CULTURE WILL BE HELD  FOR 5 DAYS BEFORE ISSUING A FINAL NEGATIVE REPORT     Performed at [facility]   Report Status PENDING   Incomplete     Labs: Basic Metabolic Panel:  Recent Labs Lab 03/25/13 0500 03/26/13 0617 03/26/13 1043 03/27/13 0640 03/28/13 0942  NA 132* 131* 132* 133* 126*  K 4.7 5.8* 5.0 4.3 5.6*  CL 98 98 96 96 93*  CO2 21 22 24 25 20   GLUCOSE 226* 230* 148* 164* 215*  BUN 35* 33* 33* 30* 31*  CREATININE 1.87* 1.79* 1.79* 1.78* 1.65*  CALCIUM 8.6 8.5 8.7 8.9 8.8   Liver Function Tests:  Recent Labs Lab 03/23/13 1608 03/24/13 0410  AST 63* 68*  ALT 23 23  ALKPHOS 39 31*  BILITOT 1.3* 0.6  PROT 7.8 7.0  ALBUMIN 3.6 3.1*   No results found for this basename: LIPASE, AMYLASE,  in the last 168 hours No results found for this basename: AMMONIA,  in the last 168 hours CBC:  Recent Labs Lab 03/23/13 1340 03/24/13 0410 03/27/13 0640 03/28/13 0543  WBC 12.1* 15.7* 16.9* 14.1*  NEUTROABS 10.1*  --   --   --   HGB 11.2* 10.1* 10.8* 11.1*  HCT 32.9* 28.9* 31.2* 32.7*  MCV 93.5 91.2 93.4 94.2  PLT 291 325 384 355   Cardiac Enzymes:  Recent Labs Lab 03/23/13 1608 03/23/13 2206 03/24/13 0410  TROPONINI <0.30 <0.30 <0.30   BNP: BNP (last 3 results)  Recent Labs  03/23/13 1340  PROBNP 12032.0*   CBG:  Recent Labs Lab 03/27/13 0802 03/27/13 1158 03/27/13 1701 03/27/13 2152 03/28/13 0741  GLUCAP 163* 180* 139* 174* 121*       Signed:  HERNANDEZ ACOSTA,ESTELA  [facility] Pager: [phone] 03/28/2013, 12:19 PM

## 2013-03-28 NOTE — Progress Notes (Signed)
[name] [name] to be D/C'd Home per MD order.  Discussed with the patient and all questions fully answered.    Medication List         amLODipine 5 MG tablet  Commonly known as:  NORVASC  Take 5 mg by mouth daily.     fenofibrate 160 MG tablet  Take 160 mg by mouth daily.     furosemide 40 MG tablet  Commonly known as:  LASIX  Take 1 tablet (40 mg total) by mouth daily.     linagliptin 5 MG Tabs tablet  Commonly known as:  TRADJENTA  Take 1 tablet (5 mg total) by mouth daily.     lisinopril 2.5 MG tablet  Commonly known as:  ZESTRIL  Take 1 tablet (2.5 mg total) by mouth daily.     predniSONE 10 MG tablet  Commonly known as:  STERAPRED UNI-PAK  Take 1 tablet (10 mg total) by mouth daily. Take as directed     Vitamin D (Ergocalciferol) 50000 UNITS Caps capsule  Commonly known as:  DRISDOL  Take 50,000 Units by mouth every 7 (seven) days. Takes on Tuesday        VVS, Skin clean, dry and intact without evidence of skin break down, no evidence of skin tears noted. IV catheter discontinued intact. Site without signs and symptoms of complications. Dressing and pressure applied.  An After Visit Summary was printed and given to the patient. Patient escorted via WC, and D/C home via private auto.  Kennyth Arnold D 03/28/2013 12:29 PM

## 2013-03-28 NOTE — Care Management Note (Addendum)
    Page 1 of 2   03/28/2013     11:31:59 AM   CARE MANAGEMENT NOTE 03/28/2013  Patient:  [name] [name], [name] [name]   Account Number:  [id number]  Date Initiated:  03/25/2013  Documentation initiated by:  Avie Arenas  Subjective/Objective Assessment:   Admitted with SOB - requiring bipap.     Action/Plan:   pt eval- rec snf, pt refusing, per physical therapy note patient states he will have somone there with him 24 hrs .   Anticipated DC Date:  03/28/2013   Anticipated DC Plan:  HOME W HOME HEALTH SERVICES      DC Planning Services  CM consult      [facility] Choice  HOME HEALTH   Choice offered to / List presented to:  C-1 Patient        HH arranged  HH-1 RN  HH-2 PT  HH-3 OT  HH-6 SOCIAL WORKER      HH agency  [facility]   Status of service:  Completed, signed off Medicare Important Message given?   (If response is "NO", the following Medicare IM given date fields will be blank) Date Medicare IM given:   Date Additional Medicare IM given:    Discharge Disposition:  HOME W HOME HEALTH SERVICES  Per UR Regulation:  Reviewed for med. necessity/level of care/duration of stay  If discussed at Long Length of Stay Meetings, dates discussed:    Comments:  Contact:  Nifong,[name] [name]       [phone]                 [name] [name],[name] [name]       [phone]  03/28/13 11:17 Letha Cape RN, BSN [phone] patient lives with friend who is blind.  Physical Therapy recs snf, patient has refused snf , patient chose [facility] , referral made to [facility], Lupita Leash notified for [facility], PT, Ot and Child psychotherapist.  Soc will begin 24-48 hrs post discharge. Patient is for discharge today.  Patient states if we call him a cab he will pay for it because he has the money to pay for it.  Patient states he can not find his clothes so he will need some clothes, he wears size 32 in pants and he wers xtra large in shirts. Charge Nurse states they can order some paper scrubs for him.

## 2013-03-30 LAB — CULTURE, BLOOD (ROUTINE X 2)
Culture: NO GROWTH
Culture: NO GROWTH
Culture: NO GROWTH

## 2013-05-16 ENCOUNTER — Emergency Department (HOSPITAL_COMMUNITY): Payer: PRIVATE HEALTH INSURANCE

## 2013-05-16 ENCOUNTER — Inpatient Hospital Stay (HOSPITAL_COMMUNITY)
Admission: EM | Admit: 2013-05-16 | Discharge: 2013-05-17 | DRG: 293 | Discharge status: Against Medical Advice | Payer: PRIVATE HEALTH INSURANCE | Attending: Internal Medicine | Admitting: Internal Medicine

## 2013-05-16 ENCOUNTER — Encounter (HOSPITAL_COMMUNITY): Payer: Self-pay | Admitting: Emergency Medicine

## 2013-05-16 DIAGNOSIS — I129 Hypertensive chronic kidney disease with stage 1 through stage 4 chronic kidney disease, or unspecified chronic kidney disease: Secondary | ICD-10-CM | POA: Diagnosis present

## 2013-05-16 DIAGNOSIS — R0602 Shortness of breath: Secondary | ICD-10-CM

## 2013-05-16 DIAGNOSIS — R739 Hyperglycemia, unspecified: Secondary | ICD-10-CM

## 2013-05-16 DIAGNOSIS — I5033 Acute on chronic diastolic (congestive) heart failure: Principal | ICD-10-CM

## 2013-05-16 DIAGNOSIS — R0902 Hypoxemia: Secondary | ICD-10-CM | POA: Diagnosis present

## 2013-05-16 DIAGNOSIS — J449 Chronic obstructive pulmonary disease, unspecified: Secondary | ICD-10-CM | POA: Diagnosis present

## 2013-05-16 DIAGNOSIS — M129 Arthropathy, unspecified: Secondary | ICD-10-CM | POA: Diagnosis present

## 2013-05-16 DIAGNOSIS — Z23 Encounter for immunization: Secondary | ICD-10-CM

## 2013-05-16 DIAGNOSIS — I509 Heart failure, unspecified: Secondary | ICD-10-CM

## 2013-05-16 DIAGNOSIS — J4489 Other specified chronic obstructive pulmonary disease: Secondary | ICD-10-CM | POA: Diagnosis present

## 2013-05-16 DIAGNOSIS — Z87891 Personal history of nicotine dependence: Secondary | ICD-10-CM

## 2013-05-16 DIAGNOSIS — N183 Chronic kidney disease, stage 3 unspecified: Secondary | ICD-10-CM

## 2013-05-16 DIAGNOSIS — I10 Essential (primary) hypertension: Secondary | ICD-10-CM

## 2013-05-16 DIAGNOSIS — IMO0001 Reserved for inherently not codable concepts without codable children: Secondary | ICD-10-CM

## 2013-05-16 DIAGNOSIS — N289 Disorder of kidney and ureter, unspecified: Secondary | ICD-10-CM

## 2013-05-16 LAB — COMPREHENSIVE METABOLIC PANEL
Albumin: 3.7 g/dL (ref 3.5–5.2)
Alkaline Phosphatase: 31 U/L — ABNORMAL LOW (ref 39–117)
BUN: 29 mg/dL — ABNORMAL HIGH (ref 6–23)
CO2: 25 mEq/L (ref 19–32)
Chloride: 93 mEq/L — ABNORMAL LOW (ref 96–112)
GFR calc Af Amer: 37 mL/min — ABNORMAL LOW (ref >90)
GFR calc non Af Amer: 32 mL/min — ABNORMAL LOW (ref >90)
Glucose, Bld: 252 mg/dL — ABNORMAL HIGH (ref 70–99)
Potassium: 4.4 mEq/L (ref 3.5–5.1)
Total Bilirubin: 0.5 mg/dL (ref 0.3–1.2)

## 2013-05-16 LAB — CBC
HCT: 35.1 % — ABNORMAL LOW (ref 39.0–52.0)
Hemoglobin: 12.2 g/dL — ABNORMAL LOW (ref 13.0–17.0)
MCH: 31.7 pg (ref 26.0–34.0)
MCHC: 34.8 g/dL (ref 30.0–36.0)
MCV: 91.2 fL (ref 78.0–100.0)
Platelets: 301 10*3/uL (ref 150–400)
RBC: 3.85 MIL/uL — ABNORMAL LOW (ref 4.22–5.81)

## 2013-05-16 LAB — CBC WITH DIFFERENTIAL/PLATELET
HCT: 36.2 % — ABNORMAL LOW (ref 39.0–52.0)
Hemoglobin: 12.3 g/dL — ABNORMAL LOW (ref 13.0–17.0)
Lymphocytes Relative: 15 % (ref 12–46)
Lymphs Abs: 1.6 10*3/uL (ref 0.7–4.0)
Monocytes Absolute: 0.8 10*3/uL (ref 0.1–1.0)
Monocytes Relative: 7 % (ref 3–12)
Neutro Abs: 7.6 10*3/uL (ref 1.7–7.7)
Neutrophils Relative %: 72 % (ref 43–77)
RBC: 3.94 MIL/uL — ABNORMAL LOW (ref 4.22–5.81)

## 2013-05-16 LAB — CREATININE, SERUM: Creatinine, Ser: 1.88 mg/dL — ABNORMAL HIGH (ref 0.50–1.35)

## 2013-05-16 LAB — POCT I-STAT TROPONIN I: Troponin i, poc: 0.03 ng/mL (ref 0.00–0.08)

## 2013-05-16 LAB — PHOSPHORUS: Phosphorus: 2.9 mg/dL (ref 2.3–4.6)

## 2013-05-16 LAB — MAGNESIUM: Magnesium: 1.9 mg/dL (ref 1.5–2.5)

## 2013-05-16 LAB — TSH: TSH: 1.672 u[IU]/mL (ref 0.350–4.500)

## 2013-05-16 LAB — GLUCOSE, CAPILLARY

## 2013-05-16 LAB — TROPONIN I
Troponin I: <0.3 ng/mL (ref <0.30)
Troponin I: <0.3 ng/mL (ref <0.30)

## 2013-05-16 MED ORDER — METHYLPREDNISOLONE SODIUM SUCC 125 MG IJ SOLR
125.0000 mg | Freq: Once | INTRAMUSCULAR | Status: DC
  Filled 2013-05-16: qty 2

## 2013-05-16 MED ORDER — SODIUM CHLORIDE 0.9 % IJ SOLN
3.0000 mL | Freq: Two times a day (BID) | INTRAMUSCULAR | Status: DC
  Administered 2013-05-16 – 2013-05-17 (×2): 3 mL via INTRAVENOUS

## 2013-05-16 MED ORDER — ALBUTEROL SULFATE (5 MG/ML) 0.5% IN NEBU: 2.5000 mg | INHALATION_SOLUTION | RESPIRATORY_TRACT | Status: AC | PRN

## 2013-05-16 MED ORDER — HEPARIN SODIUM (PORCINE) 5000 UNIT/ML IJ SOLN
5000.0000 [IU] | Freq: Three times a day (TID) | INTRAMUSCULAR | Status: DC
  Administered 2013-05-16 – 2013-05-17 (×3): 5000 [IU] via SUBCUTANEOUS
  Filled 2013-05-16 (×6): qty 1

## 2013-05-16 MED ORDER — INFLUENZA VAC SPLIT QUAD 0.5 ML IM SUSP
0.5000 mL | Freq: Once | INTRAMUSCULAR | Status: AC
  Administered 2013-05-16: 0.5 mL via INTRAMUSCULAR
  Filled 2013-05-16: qty 0.5

## 2013-05-16 MED ORDER — SODIUM CHLORIDE 0.9 % IV SOLN: 250.0000 mL | INTRAVENOUS | Status: DC | PRN

## 2013-05-16 MED ORDER — FUROSEMIDE 10 MG/ML IJ SOLN
40.0000 mg | Freq: Two times a day (BID) | INTRAMUSCULAR | Status: DC
  Administered 2013-05-16 – 2013-05-17 (×2): 40 mg via INTRAVENOUS
  Filled 2013-05-16 (×3): qty 4

## 2013-05-16 MED ORDER — FUROSEMIDE 10 MG/ML IJ SOLN
60.0000 mg | Freq: Once | INTRAMUSCULAR | Status: AC
  Administered 2013-05-16: 60 mg via INTRAVENOUS
  Filled 2013-05-16: qty 6

## 2013-05-16 MED ORDER — ALBUTEROL SULFATE (5 MG/ML) 0.5% IN NEBU
5.0000 mg | INHALATION_SOLUTION | Freq: Once | RESPIRATORY_TRACT | Status: AC
  Administered 2013-05-16: 5 mg via RESPIRATORY_TRACT
  Filled 2013-05-16: qty 1

## 2013-05-16 MED ORDER — FENOFIBRATE 160 MG PO TABS
160.0000 mg | ORAL_TABLET | Freq: Every day | ORAL | Status: DC
  Administered 2013-05-16 – 2013-05-17 (×2): 160 mg via ORAL
  Filled 2013-05-16 (×2): qty 1

## 2013-05-16 MED ORDER — ONDANSETRON HCL 4 MG/2ML IJ SOLN: 4.0000 mg | Freq: Four times a day (QID) | INTRAMUSCULAR | Status: DC | PRN

## 2013-05-16 MED ORDER — ONDANSETRON HCL 4 MG/2ML IJ SOLN: 4.0000 mg | Freq: Three times a day (TID) | INTRAMUSCULAR | Status: DC | PRN

## 2013-05-16 MED ORDER — INSULIN ASPART 100 UNIT/ML ~~LOC~~ SOLN: 0.0000 [IU] | Freq: Every day | SUBCUTANEOUS | Status: DC

## 2013-05-16 MED ORDER — ONDANSETRON HCL 4 MG PO TABS: 4.0000 mg | ORAL_TABLET | Freq: Four times a day (QID) | ORAL | Status: DC | PRN

## 2013-05-16 MED ORDER — AMLODIPINE BESYLATE 5 MG PO TABS
5.0000 mg | ORAL_TABLET | Freq: Every day | ORAL | Status: DC
  Administered 2013-05-16 – 2013-05-17 (×2): 5 mg via ORAL
  Filled 2013-05-16 (×2): qty 1

## 2013-05-16 MED ORDER — SODIUM CHLORIDE 0.9 % IJ SOLN: 3.0000 mL | INTRAMUSCULAR | Status: DC | PRN

## 2013-05-16 MED ORDER — SODIUM CHLORIDE 0.9 % IJ SOLN
3.0000 mL | Freq: Two times a day (BID) | INTRAMUSCULAR | Status: DC
  Administered 2013-05-16 (×2): 3 mL via INTRAVENOUS

## 2013-05-16 MED ORDER — INSULIN ASPART 100 UNIT/ML ~~LOC~~ SOLN
0.0000 [IU] | Freq: Three times a day (TID) | SUBCUTANEOUS | Status: DC
  Administered 2013-05-16: 8 [IU] via SUBCUTANEOUS
  Administered 2013-05-17: 3 [IU] via SUBCUTANEOUS

## 2013-05-16 MED ORDER — IPRATROPIUM BROMIDE 0.02 % IN SOLN
0.5000 mg | Freq: Once | RESPIRATORY_TRACT | Status: AC
  Administered 2013-05-16: 0.5 mg via RESPIRATORY_TRACT
  Filled 2013-05-16: qty 2.5

## 2013-05-16 NOTE — ED Notes (Signed)
EMS called out to home of Pt. Family states that Pt. Started having shortness of breath prior to bed. Stated that by the time he arose this am he had gotten much worse. EMS stated that by the time they arrived he was sitting in a tripod position, breathing with retractions and SPO2 of 83% on RA. C/O CP. 12 lead was clear. Given 2 nitro SL. O2 sats improved with CPAP to 98%.

## 2013-05-16 NOTE — ED Notes (Signed)
Pt niece arrived at bedside. Respiratory at bedside, giving breathing treatment.

## 2013-05-16 NOTE — Progress Notes (Signed)
Utilization Review Completed.   Gotti Alwin, RN, BSN Nurse Case Manager  [phone]  

## 2013-05-16 NOTE — Progress Notes (Signed)
05/16/13 Pt.is A/Ox4 and is ambulatory. He walks frequently throughout the room. He is on room air. No c/o pain. No signs of distress. Patient continues to say that he "needs to get home tomorrow" and that "the doctor said he should be discharged tomorrow". Patient said that he needs to pay his rent and bills tomorrow.

## 2013-05-16 NOTE — ED Provider Notes (Signed)
CSN: [id number]     Arrival date & time 05/16/13  7846 History   None    Chief Complaint  Patient presents with  . Shortness of Breath   (Consider location/radiation/quality/duration/timing/severity/associated sxs/prior Treatment) HPI [name] [name] is a 79 y.o. male who presents to emergency department with complaints of shortness of breath. Patient states that he developed shortness of breath last night. States this morning he felt much worse. EMS was called and found him to have oxygen saturation of 83%, he was tachypneic and tachycardic. He was noted to have some retractions and having tripod position, he was also complaining of chest pain. He was given 2 nitroglycerin sublingually and was transported to the emergency department on CPAP. Patient states since being on CPAP he is feeling better. He continues to have substernal chest pain that he says is worse when he coughs and when he is breathing. Patient has history of CHF, COPD hypertension, diabetes, chronic kidney disease. Patient denies any swelling of the extremities. Patient denies any back pain or abdominal pain. Patient denies any cough.   Past Medical History  Diagnosis Date  . Hypertension   . Shortness of breath     with exertion  . Diabetes mellitus     does not check his blood sugar at home  . Arthritis   . Chronic kidney disease     renal insuficiency   Past Surgical History  Procedure Laterality Date  . Leg surgery      both legs - unsure of exact operations - from trauma  . Tonsillectomy     No family history on file. History  Substance Use Topics  . Smoking status: Former Smoker -- 0.50 packs/day    Quit date: 05/14/2009  . Smokeless tobacco: Never Used  . Alcohol Use: No    Review of Systems  Constitutional: Negative for fever and chills.  HENT: Negative for neck pain and neck stiffness.   Respiratory: Positive for chest tightness and shortness of breath. Negative for cough.   Cardiovascular: Positive  for chest pain. Negative for palpitations and leg swelling.  Gastrointestinal: Negative for nausea, vomiting, abdominal pain, diarrhea and abdominal distention.  Genitourinary: Negative for dysuria, urgency, frequency and hematuria.  Musculoskeletal: Negative for myalgias and arthralgias.  Skin: Negative for rash.  Allergic/Immunologic: Negative for immunocompromised state.  Neurological: Negative for dizziness, weakness, light-headedness, numbness and headaches.    Allergies  Review of patient's allergies indicates no known allergies.  Home Medications   Current Outpatient Rx  Name  Route  Sig  Dispense  Refill  . amLODipine (NORVASC) 5 MG tablet   Oral   Take 5 mg by mouth daily.         . fenofibrate 160 MG tablet   Oral   Take 160 mg by mouth daily.         . furosemide (LASIX) 40 MG tablet   Oral   Take 1 tablet (40 mg total) by mouth daily.   30 tablet   1   . linagliptin (TRADJENTA) 5 MG TABS tablet   Oral   Take 1 tablet (5 mg total) by mouth daily.   30 tablet   0   . lisinopril (ZESTRIL) 2.5 MG tablet   Oral   Take 1 tablet (2.5 mg total) by mouth daily.   30 tablet   1   . predniSONE (STERAPRED UNI-PAK) 10 MG tablet   Oral   Take 1 tablet (10 mg total) by mouth daily. Take as directed  21 tablet   0   . Vitamin D, Ergocalciferol, (DRISDOL) 50000 UNITS CAPS capsule   Oral   Take 50,000 Units by mouth every 7 (seven) days. Takes on Tuesday          BP 119/66  Pulse 102  Resp 22  SpO2 100% Physical Exam  Nursing note and vitals reviewed. Constitutional: He is oriented to person, place, and time. He appears well-developed and well-nourished.  Patient on CPAP  HENT:  Head: Normocephalic and atraumatic.  Eyes: Conjunctivae are normal.  Neck: Neck supple.  Cardiovascular: Normal rate, regular rhythm and normal heart sounds.   Pulmonary/Chest: He is in respiratory distress. He has wheezes. He has no rales. He exhibits no tenderness.  End  expiratory wheezes bilaterally. Decreased air movement bilaterally.  Abdominal: Soft. Bowel sounds are normal. He exhibits no distension. There is no tenderness. There is no rebound.  Musculoskeletal: He exhibits no edema.  Neurological: He is alert and oriented to person, place, and time.  Skin: Skin is warm and dry.    ED Course  Procedures (including critical care time) Labs Review Labs Reviewed  CBC WITH DIFFERENTIAL - Abnormal; Notable for the following:    RBC 3.94 (*)    Hemoglobin 12.3 (*)    HCT 36.2 (*)    All other components within normal limits  COMPREHENSIVE METABOLIC PANEL - Abnormal; Notable for the following:    Sodium 130 (*)    Chloride 93 (*)    Glucose, Bld 252 (*)    BUN 29 (*)    Creatinine, Ser 1.88 (*)    AST 45 (*)    Alkaline Phosphatase 31 (*)    GFR calc non Af Amer 32 (*)    GFR calc Af Amer 37 (*)    All other components within normal limits  PRO B NATRIURETIC PEPTIDE - Abnormal; Notable for the following:    Pro B Natriuretic peptide (BNP) 3500.0 (*)    All other components within normal limits  POCT I-STAT TROPONIN I   Imaging Review Dg Chest Portable 1 View  05/16/2013   CLINICAL DATA:  Shortness of breath with cough.  EXAM: PORTABLE CHEST - 1 VIEW  COMPARISON:  03/25/2013.  FINDINGS: Pulmonary edema has worsened with bilateral effusions, central perihilar edema, and mild bibasilar atelectasis. No consolidation or pneumothorax. Stable cardiomegaly. Unremarkable osseous structures.  IMPRESSION: Worsening aeration. Cardiomegaly with pulmonary edema.   Electronically Signed   By: Davonna Belling M.D.   On: 05/16/2013 07:14    Date: 05/16/2013  Rate: 100  Rhythm: sinus tachycardia  QRS Axis: normal  Intervals: normal  ST/T Wave abnormalities: nonspecific T wave changes  Conduction Disutrbances:none  Narrative Interpretation:   Old EKG Reviewed: unchanged   MDM   1. CHF (congestive heart failure), acute on chronic, diastolic   2. Shortness  of breath     PT per EMS sats 83% on RA, respiratory distress. Started on bipap. Feeling better on bipap. Labs and CXR ordered.  9:15 AM Pulmonary edema on CXR. Pt received a neb treatment, continues to improve on bipap, lasix 60mg  IVP ordered.    10:18 AM Spoke with [facility], will admit. Also pt feeling much better. Switched to nasal canula. Tolerating it well on 3L of oxygen. Oxygen sat 100%   Filed Vitals:   05/16/13 0724 05/16/13 0745 05/16/13 0900 05/16/13 0915  BP:  158/69 176/75 160/62  Pulse: 88 93 81 80  Temp:      TempSrc:  Resp: 19 31 16 16   SpO2: 100% 96% 100% 100%       Lottie Mussel, PA-C 05/16/13 1602

## 2013-05-16 NOTE — ED Notes (Signed)
Pt taken off BiPap by PA. Pt placed on 2 L, is at 100%. Is tolerating well.

## 2013-05-16 NOTE — Progress Notes (Signed)
Pt admitted to room 4east 5.  Pt's son is angry and asking why pt was being admitted.  Explained the reason to pt's son and told the family I would ask Dr. Cena Benton why pt was NPO.  Notified Dr. Cena Benton of pt's arrival and order given to place pt on heart healthy diet with 1500 FR, as the pt is not due to have any procedure that would require NPO diet.  Pt is alert and oriented, placed on tele, and oriented to the room.  Will continue to monitor.

## 2013-05-16 NOTE — H&P (Signed)
[facility] History and Physical  [name] [name] WNU:[id number] DOB: 1930-10-23 DOA: 05/16/2013  Referring physician: Dr. Rubin Payor PCP: Alva Garnet., MD  Specialists: none  Chief Complaint: SOB  HPI: [name] [name] is a 79 y.o. male  With h/o CKD stage III, DM, and h/o CHF who presented to the ED complaining of SOB.  He states that the problem is chronic but has noticed that over the last several days he has become more short of breath.  He states that he used a fan to help him breath better last night but with time his SOB got worse and patient decided to call EMS.  Reportedly initially patient was found to be hypoxic in the 80's on room air.  He denies any fevers, chills, cough, hemoptysis, chest pain.  While in the ED patient was found to have a normal troponin level and an elevated BNP of 3500. We were consulted for management of acute CHF exacerbation.  He was given lasix 60 mg IV x 1 and placed on Bipap in the ED. By the time patient was seen in room he felt much improved.  Review of Systems: 10 point review of system reviewed and negative unless otherwise mentioned above  Past Medical History  Diagnosis Date  . Hypertension   . Shortness of breath     with exertion  . Diabetes mellitus     does not check his blood sugar at home  . Arthritis   . Chronic kidney disease     renal insuficiency   Past Surgical History  Procedure Laterality Date  . Leg surgery      both legs - unsure of exact operations - from trauma  . Tonsillectomy     Social History:  reports that he quit smoking about 4 years ago. He has never used smokeless tobacco. He reports that he does not drink alcohol or use illicit drugs.  where does patient live--home, ALF, SNF? and with whom if at home? Lives at home with family  Can patient participate in ADLs? yes  No Known Allergies  No family history on file. None other reported  Prior to Admission medications   Medication Sig Start Date End  Date Taking? Authorizing Provider  amLODipine (NORVASC) 5 MG tablet Take 5 mg by mouth daily.   Yes Historical Provider, MD  furosemide (LASIX) 40 MG tablet Take 40 mg by mouth daily. 03/28/13  Yes Henderson Cloud, MD  linagliptin (TRADJENTA) 5 MG TABS tablet Take 5 mg by mouth daily. 01/20/13  Yes Clydia Llano, MD  lisinopril (ZESTRIL) 2.5 MG tablet Take 1 tablet (2.5 mg total) by mouth daily. 03/28/13  Yes Estela Isaiah Blakes, MD  OVER THE COUNTER MEDICATION Take 1 tablet by mouth daily. Aspirin   Yes Historical Provider, MD  fenofibrate 160 MG tablet Take 160 mg by mouth daily.    Historical Provider, MD  Vitamin D, Ergocalciferol, (DRISDOL) 50000 UNITS CAPS capsule Take 50,000 Units by mouth every 7 (seven) days. Takes on Tuesday    Historical Provider, MD   Physical Exam: Filed Vitals:   05/16/13 1115  BP: 180/69  Pulse: 89  Temp: 98.1 F (36.7 C)  Resp: 22     General:  Pt in NAD, Alert and awake  Eyes: EOMI, non icteric  ENT: normal exterior appearance, no masses on visual examination  Neck: supple, no goiter  Cardiovascular: RRR, no MRG  Respiratory: Decreased breath sounds  Abdomen: soft, NT, ND  Skin: warm and dry  Musculoskeletal: no cyanosis or clubbing  Psychiatric: mood and affect appropriate  Neurologic: answers questions appropriately  Labs on Admission:  Basic Metabolic Panel:  Recent Labs Lab 05/16/13 0800  NA 130*  K 4.4  CL 93*  CO2 25  GLUCOSE 252*  BUN 29*  CREATININE 1.88*  CALCIUM 8.6   Liver Function Tests:  Recent Labs Lab 05/16/13 0800  AST 45*  ALT 18  ALKPHOS 31*  BILITOT 0.5  PROT 7.4  ALBUMIN 3.7   No results found for this basename: LIPASE, AMYLASE,  in the last 168 hours No results found for this basename: AMMONIA,  in the last 168 hours CBC:  Recent Labs Lab 05/16/13 0800  WBC 10.5  NEUTROABS 7.6  HGB 12.3*  HCT 36.2*  MCV 91.9  PLT 270   Cardiac Enzymes: No results found for this  basename: CKTOTAL, CKMB, CKMBINDEX, TROPONINI,  in the last 168 hours  BNP (last 3 results)  Recent Labs  03/23/13 1340 05/16/13 0800  PROBNP 12032.0* 3500.0*   CBG: No results found for this basename: GLUCAP,  in the last 168 hours  Radiological Exams on Admission: Dg Chest Portable 1 View  05/16/2013   CLINICAL DATA:  Shortness of breath with cough.  EXAM: PORTABLE CHEST - 1 VIEW  COMPARISON:  03/25/2013.  FINDINGS: Pulmonary edema has worsened with bilateral effusions, central perihilar edema, and mild bibasilar atelectasis. No consolidation or pneumothorax. Stable cardiomegaly. Unremarkable osseous structures.  IMPRESSION: Worsening aeration. Cardiomegaly with pulmonary edema.   Electronically Signed   By: Davonna Belling M.D.   On: 05/16/2013 07:14    EKG: Not available for review but per my discussion with ED personnel was unchanged from last.  Assessment/Plan Active Problems:  1. Acute on chronic diastolic heart failure - Patient with echocardiogram in august 2014 with normal reported EF at that time. As such will not reorder echo - troponin x 3 - telemetry monitoring  - Cardiac diet with fluid restriction - lasix 40 mg IV bid  2. CKD - hold lisinopril at this juncture - Monitor S creatinine - Creatinine around baseline  3. HTN - Continue amlodipine at this juncture - May have to increase antihypertensive medication pending values  4. DM - diabetic diet - routine cbg checks - SSI (moderate)   5. DVT prophylaxis - heparin   Code Status: full Family Communication: discussed with patient and son at bedside.  Disposition Plan: If breathing status improved may consider d/c next am.  Time spent: > 60 minutes  Penny Pia [facility] Pager [phone]  If 7PM-7AM, please contact night-coverage [email or web address] Password [facility] 05/16/2013, 12:31 PM

## 2013-05-16 NOTE — Plan of Care (Signed)
Problem: Phase I Progression Outcomes Goal: EF % per last Echo/documented,Core Reminder form on chart 05/16/13 EF per last echo showed EF of 50-55%

## 2013-05-16 NOTE — ED Notes (Signed)
Pt remaining 100% 2 L, unlabored breathing. Speaking with his niece at bedside.

## 2013-05-16 NOTE — ED Notes (Signed)
Phlebotomy made aware of need for blood draws.

## 2013-05-17 LAB — BASIC METABOLIC PANEL
BUN: 31 mg/dL — ABNORMAL HIGH (ref 6–23)
CO2: 27 mEq/L (ref 19–32)
Calcium: 8.3 mg/dL — ABNORMAL LOW (ref 8.4–10.5)
Creatinine, Ser: 1.88 mg/dL — ABNORMAL HIGH (ref 0.50–1.35)
GFR calc Af Amer: 37 mL/min — ABNORMAL LOW (ref >90)
Glucose, Bld: 113 mg/dL — ABNORMAL HIGH (ref 70–99)
Potassium: 3.5 mEq/L (ref 3.5–5.1)
Sodium: 133 mEq/L — ABNORMAL LOW (ref 135–145)

## 2013-05-17 LAB — CBC
Hemoglobin: 11.7 g/dL — ABNORMAL LOW (ref 13.0–17.0)
MCH: 31.5 pg (ref 26.0–34.0)
MCHC: 34.6 g/dL (ref 30.0–36.0)
MCV: 90.9 fL (ref 78.0–100.0)
RBC: 3.72 MIL/uL — ABNORMAL LOW (ref 4.22–5.81)

## 2013-05-17 LAB — TROPONIN I: Troponin I: <0.3 ng/mL (ref <0.30)

## 2013-05-17 NOTE — Progress Notes (Signed)
Received call from [facility] Liaison.  Patient is active with [facility] Care Management.  He will receive transition of care call and ongoing engagement from the CAD and DM telephonic management programs.  Of note, [facility] Care Management services does not replace or interfere with any services that are arranged by inpatient case management or social work.  For additional questions or referrals please contact Anibal Henderson BSN RN [facility] Liaison at [phone].

## 2013-05-17 NOTE — Progress Notes (Signed)
[facility] PROGRESS NOTE Interim History: 79 y.o. male With h/o CKD stage III, DM, and h/o CHF who presented to the ED complaining of SOB. He states that the problem is chronic but has noticed that over the last several days he has become more short of breath.  Filed Weights   05/16/13 1115 05/17/13 0510  Weight: 101.016 kg (222 lb 11.2 oz) 100.381 kg (221 lb 4.8 oz)       Recent Labs  03/23/13 1340 05/16/13 0800  PROBNP 12032.0* 3500.0*    Assessment/Plan: Acute on chronic diastolic CHF (congestive heart failure) - On IV lasix, minmal urine. - Monitor electrolytes closely. - I spoke with the pt about staying but he still wants to leave AMA despite me explain risk . - I recommended him to follow with PCP early next week.  HTN (hypertension) - BP improved.    Type II or unspecified type diabetes mellitus without mention of complication, uncontrolled: - cont home meds.  CKD (chronic kidney disease), stage III - at baseline.    Code Status: full Family Communication: none  Disposition Plan: inpatint   Consultants:  none  Procedures: ECHO: none  Antibiotics:  None  HPI/Subjective: relates feels and want to go home.  Objective: Filed Vitals:   05/16/13 1900 05/16/13 2010 05/17/13 0510 05/17/13 0919  BP: 140/54 134/93 144/70 135/54  Pulse: 89 86 57   Temp: 98 F (36.7 C) 97.6 F (36.4 C) 97.9 F (36.6 C)   TempSrc: Oral Oral Oral   Resp: 20 18 18    Height:      Weight:   100.381 kg (221 lb 4.8 oz)   SpO2: 98% 98% 96%      Exam:  General: Alert, awake, oriented x3, in no acute distress.  HEENT: No bruits, no goiter. +JVD Heart: Regular rate and rhythm, without murmurs, rubs, gallops.  Lungs: Good air movement, bilateral air movement.  Abdomen: Soft, nontender, nondistended, positive bowel sounds.  Neuro: Grossly intact, nonfocal.   Data Reviewed: Basic Metabolic Panel:  Recent Labs Lab 05/16/13 0800 05/16/13 1423 05/17/13 0110   NA 130*  --  133*  K 4.4  --  3.5  CL 93*  --  96  CO2 25  --  27  GLUCOSE 252*  --  113*  BUN 29*  --  31*  CREATININE 1.88* 1.88* 1.88*  CALCIUM 8.6  --  8.3*  MG  --  1.9  --   PHOS  --  2.9  --    Liver Function Tests:  Recent Labs Lab 05/16/13 0800  AST 45*  ALT 18  ALKPHOS 31*  BILITOT 0.5  PROT 7.4  ALBUMIN 3.7   No results found for this basename: LIPASE, AMYLASE,  in the last 168 hours No results found for this basename: AMMONIA,  in the last 168 hours CBC:  Recent Labs Lab 05/16/13 0800 05/16/13 1423 05/17/13 0110  WBC 10.5 10.6* 9.4  NEUTROABS 7.6  --   --   HGB 12.3* 12.2* 11.7*  HCT 36.2* 35.1* 33.8*  MCV 91.9 91.2 90.9  PLT 270 301 290   Cardiac Enzymes:  Recent Labs Lab 05/16/13 1423 05/16/13 1833 05/17/13 0110  TROPONINI <0.30 <0.30 <0.30   BNP (last 3 results)  Recent Labs  03/23/13 1340 05/16/13 0800  PROBNP 12032.0* 3500.0*   CBG:  Recent Labs Lab 05/16/13 1602 05/16/13 2120 05/17/13 0604  GLUCAP 271* 172* 171*    No results found for this or any previous visit (  from the past 240 hour(s)).   Studies: Dg Chest Portable 1 View  05/16/2013   CLINICAL DATA:  Shortness of breath with cough.  EXAM: PORTABLE CHEST - 1 VIEW  COMPARISON:  03/25/2013.  FINDINGS: Pulmonary edema has worsened with bilateral effusions, central perihilar edema, and mild bibasilar atelectasis. No consolidation or pneumothorax. Stable cardiomegaly. Unremarkable osseous structures.  IMPRESSION: Worsening aeration. Cardiomegaly with pulmonary edema.   Electronically Signed   By: Davonna Belling M.D.   On: 05/16/2013 07:14    Scheduled Meds: . amLODipine  5 mg Oral Daily  . fenofibrate  160 mg Oral Daily  . furosemide  40 mg Intravenous BID  . heparin  5,000 Units Subcutaneous Q8H  . insulin aspart  0-15 Units Subcutaneous TID WC  . insulin aspart  0-5 Units Subcutaneous QHS  . sodium chloride  3 mL Intravenous Q12H  . sodium chloride  3 mL Intravenous  Q12H   Continuous Infusions:    Marinda Elk  [facility] Pager [phone]. If 8PM-8AM, please contact night-coverage at [email or web address], password [facility] 05/17/2013, 9:42 AM  LOS: 1 day

## 2013-05-17 NOTE — Progress Notes (Signed)
Pt spoke with doctor and is leaving AMA, paperwork in chart

## 2013-05-20 NOTE — Discharge Summary (Signed)
Physician Discharge Summary  [name] [name] WGN:[id number] DOB: Jun 06, 1931 DOA: 05/16/2013      LEFT AMA PCP: Alva Garnet., MD  Admit date: 05/16/2013 Discharge date: 05/20/2013  Time spent: 35 minutes  Recommendations for Outpatient Follow-up:  1. Follow up PCP  Discharge Diagnoses:  Active Problems:   CKD (chronic kidney disease), stage III   Acute on chronic diastolic CHF (congestive heart failure)   HTN (hypertension)   Type II or unspecified type diabetes mellitus without mention of complication, uncontrolled   Discharge Condition: stable  Diet recommendation: low sodium diet  Filed Weights   05/16/13 1115 05/17/13 0510  Weight: 101.016 kg (222 lb 11.2 oz) 100.381 kg (221 lb 4.8 oz)    History of present illness:  79 y.o. male  With h/o CKD stage III, DM, and h/o CHF who presented to the ED complaining of SOB. He states that the problem is chronic but has noticed that over the last several days he has become more short of breath. He states that he used a fan to help him breath better last night but with time his SOB got worse and patient decided to call EMS. Reportedly initially patient was found to be hypoxic in the 80's on room air. He denies any fevers, chills, cough, hemoptysis, chest pain.   Hospital Course:  Acute on chronic diastolic CHF (congestive heart failure)  - On IV lasix, minmal urine.  - Monitor electrolytes closely. - I spoke with the pt about staying but he still wants to leave AMA despite me explaining the risk .  - I recommended him to follow with PCP early next week.   HTN (hypertension)  - BP improved.   Type II or unspecified type diabetes mellitus without mention of complication, uncontrolled:  - cont home meds.   CKD (chronic kidney disease), stage III  - at baseline.   Procedures:  CXR 10.2.2014  Consultations:  none  Discharge Exam: Filed Vitals:   05/17/13 0919  BP: 135/54  Pulse:   Temp:   Resp:     General: A&O  x3 Cardiovascular: RRR Respiratory: good air movement CTA B/L  Discharge Instructions     Medication List    ASK your doctor about these medications       amLODipine 5 MG tablet  Commonly known as:  NORVASC  Take 5 mg by mouth daily.     fenofibrate 160 MG tablet  Take 160 mg by mouth daily.     furosemide 40 MG tablet  Commonly known as:  LASIX  Take 40 mg by mouth daily.     linagliptin 5 MG Tabs tablet  Commonly known as:  TRADJENTA  Take 5 mg by mouth daily.     lisinopril 2.5 MG tablet  Commonly known as:  ZESTRIL  Take 1 tablet (2.5 mg total) by mouth daily.     OVER THE COUNTER MEDICATION  Take 1 tablet by mouth daily. Aspirin     Vitamin D (Ergocalciferol) 50000 UNITS Caps capsule  Commonly known as:  DRISDOL  Take 50,000 Units by mouth every 7 (seven) days. Takes on Tuesday       No Known Allergies     Follow-up Information   Follow up with Alva Garnet., MD In 1 week. (hospital follow up)    Specialty:  Internal Medicine   Contact information:   [address] [place] [address] [phone]        The results of significant diagnostics from this hospitalization (  including imaging, microbiology, ancillary and laboratory) are listed below for reference.    Significant Diagnostic Studies: Dg Chest Portable 1 View  05/16/2013   CLINICAL DATA:  Shortness of breath with cough.  EXAM: PORTABLE CHEST - 1 VIEW  COMPARISON:  03/25/2013.  FINDINGS: Pulmonary edema has worsened with bilateral effusions, central perihilar edema, and mild bibasilar atelectasis. No consolidation or pneumothorax. Stable cardiomegaly. Unremarkable osseous structures.  IMPRESSION: Worsening aeration. Cardiomegaly with pulmonary edema.   Electronically Signed   By: Davonna Belling M.D.   On: 05/16/2013 07:14    Microbiology: No results found for this or any previous visit (from the past 240 hour(s)).   Labs: Basic Metabolic Panel:  Recent Labs Lab  05/16/13 0800 05/16/13 1423 05/17/13 0110  NA 130*  --  133*  K 4.4  --  3.5  CL 93*  --  96  CO2 25  --  27  GLUCOSE 252*  --  113*  BUN 29*  --  31*  CREATININE 1.88* 1.88* 1.88*  CALCIUM 8.6  --  8.3*  MG  --  1.9  --   PHOS  --  2.9  --    Liver Function Tests:  Recent Labs Lab 05/16/13 0800  AST 45*  ALT 18  ALKPHOS 31*  BILITOT 0.5  PROT 7.4  ALBUMIN 3.7   No results found for this basename: LIPASE, AMYLASE,  in the last 168 hours No results found for this basename: AMMONIA,  in the last 168 hours CBC:  Recent Labs Lab 05/16/13 0800 05/16/13 1423 05/17/13 0110  WBC 10.5 10.6* 9.4  NEUTROABS 7.6  --   --   HGB 12.3* 12.2* 11.7*  HCT 36.2* 35.1* 33.8*  MCV 91.9 91.2 90.9  PLT 270 301 290   Cardiac Enzymes:  Recent Labs Lab 05/16/13 1423 05/16/13 1833 05/17/13 0110  TROPONINI <0.30 <0.30 <0.30   BNP: BNP (last 3 results)  Recent Labs  03/23/13 1340 05/16/13 0800  PROBNP 12032.0* 3500.0*   CBG:  Recent Labs Lab 05/16/13 1602 05/16/13 2120 05/17/13 0604  GLUCAP 271* 172* 171*       Signed:  Marinda Elk  [facility] 05/20/2013, 11:20 AM

## 2013-05-20 NOTE — ED Provider Notes (Signed)
Medical screening examination/treatment/procedure(s) were conducted as a shared visit with non-physician practitioner(s) and myself.  I personally evaluated the patient during the encounter Patient with shortness of breath and hypoxia. Initially required BiPAP. Given IV Lasix. Patient continued to improve and was weaned off BiPAP. Admitted to internal medicine.  CRITICAL CARE Performed by: Billee Cashing Total critical care time: 30 Critical care time was exclusive of separately billable procedures and treating other patients. Critical care was necessary to treat or prevent imminent or life-threatening deterioration. Critical care was time spent personally by me on the following activities: development of treatment plan with patient and/or surrogate as well as nursing, discussions with consultants, evaluation of patient's response to treatment, examination of patient, obtaining history from patient or surrogate, ordering and performing treatments and interventions, ordering and review of laboratory studies, ordering and review of radiographic studies, pulse oximetry and re-evaluation of patient's condition.  [name] [name]. Rubin Payor, MD 05/20/13 1447

## 2013-07-02 ENCOUNTER — Encounter (HOSPITAL_COMMUNITY): Payer: Self-pay | Admitting: Emergency Medicine

## 2013-07-02 ENCOUNTER — Emergency Department (HOSPITAL_COMMUNITY)
Admission: EM | Admit: 2013-07-02 | Discharge: 2013-07-02 | Disposition: A | Discharge status: Home / Self Care | Payer: PRIVATE HEALTH INSURANCE | Attending: Emergency Medicine | Admitting: Emergency Medicine

## 2013-07-02 ENCOUNTER — Emergency Department (HOSPITAL_COMMUNITY): Payer: PRIVATE HEALTH INSURANCE

## 2013-07-02 DIAGNOSIS — Z87891 Personal history of nicotine dependence: Secondary | ICD-10-CM | POA: Insufficient documentation

## 2013-07-02 DIAGNOSIS — M129 Arthropathy, unspecified: Secondary | ICD-10-CM | POA: Insufficient documentation

## 2013-07-02 DIAGNOSIS — J449 Chronic obstructive pulmonary disease, unspecified: Secondary | ICD-10-CM | POA: Insufficient documentation

## 2013-07-02 DIAGNOSIS — Z79899 Other long term (current) drug therapy: Secondary | ICD-10-CM | POA: Insufficient documentation

## 2013-07-02 DIAGNOSIS — I129 Hypertensive chronic kidney disease with stage 1 through stage 4 chronic kidney disease, or unspecified chronic kidney disease: Secondary | ICD-10-CM | POA: Insufficient documentation

## 2013-07-02 DIAGNOSIS — J4489 Other specified chronic obstructive pulmonary disease: Secondary | ICD-10-CM | POA: Insufficient documentation

## 2013-07-02 DIAGNOSIS — R079 Chest pain, unspecified: Secondary | ICD-10-CM | POA: Insufficient documentation

## 2013-07-02 DIAGNOSIS — R609 Edema, unspecified: Secondary | ICD-10-CM | POA: Insufficient documentation

## 2013-07-02 DIAGNOSIS — E119 Type 2 diabetes mellitus without complications: Secondary | ICD-10-CM | POA: Insufficient documentation

## 2013-07-02 DIAGNOSIS — Z7982 Long term (current) use of aspirin: Secondary | ICD-10-CM | POA: Insufficient documentation

## 2013-07-02 DIAGNOSIS — I509 Heart failure, unspecified: Secondary | ICD-10-CM

## 2013-07-02 DIAGNOSIS — N189 Chronic kidney disease, unspecified: Secondary | ICD-10-CM | POA: Insufficient documentation

## 2013-07-02 HISTORY — DX: Chronic obstructive pulmonary disease, unspecified: J44.9

## 2013-07-02 LAB — CBC WITH DIFFERENTIAL/PLATELET
Basophils Absolute: 0.1 10*3/uL (ref 0.0–0.1)
Basophils Relative: 1 % (ref 0–1)
Eosinophils Absolute: 0.6 10*3/uL (ref 0.0–0.7)
Eosinophils Relative: 8 % — ABNORMAL HIGH (ref 0–5)
Hemoglobin: 11.5 g/dL — ABNORMAL LOW (ref 13.0–17.0)
MCH: 32 pg (ref 26.0–34.0)
MCHC: 34.6 g/dL (ref 30.0–36.0)
MCV: 92.5 fL (ref 78.0–100.0)
Monocytes Relative: 7 % (ref 3–12)
Neutrophils Relative %: 64 % (ref 43–77)

## 2013-07-02 LAB — POCT I-STAT, CHEM 8
BUN: 26 mg/dL — ABNORMAL HIGH (ref 6–23)
Calcium, Ion: 1.14 mmol/L (ref 1.13–1.30)
Creatinine, Ser: 1.8 mg/dL — ABNORMAL HIGH (ref 0.50–1.35)
Glucose, Bld: 180 mg/dL — ABNORMAL HIGH (ref 70–99)
HCT: 38 % — ABNORMAL LOW (ref 39.0–52.0)
Hemoglobin: 12.9 g/dL — ABNORMAL LOW (ref 13.0–17.0)
Sodium: 139 mEq/L (ref 135–145)
TCO2: 23 mmol/L (ref 0–100)

## 2013-07-02 MED ORDER — ALBUTEROL SULFATE (2.5 MG/3ML) 0.083% IN NEBU: 2.5000 mg | INHALATION_SOLUTION | Freq: Every day | RESPIRATORY_TRACT | Status: DC

## 2013-07-02 NOTE — ED Notes (Signed)
Myself and Sharyl Nimrod ([employer] Careers II Student) ambulated pt from stretcher to nurses' station and back on pulse oximetry stating 93% - 97% on RA; pt uses a cane to ambulate all the time and was the only assistance pt needed to ambulate; pt sitting in a chair beside stretcher; Maxine Glenn, RN and Rubin Payor, MD made aware

## 2013-07-02 NOTE — ED Notes (Signed)
C/o SOB upon awakening this morning. States ran out of albuterol nebs 3 days ago. Placed on CPAP by EMS.

## 2013-07-02 NOTE — ED Provider Notes (Signed)
CSN: [id number]     Arrival date & time 07/02/13  4098 History   First MD Initiated Contact with Patient 07/02/13 [phone]     Chief Complaint  Patient presents with  . Shortness of Breath   (Consider location/radiation/quality/duration/timing/severity/associated sxs/prior Treatment) Patient is a 79 y.o. male presenting with shortness of breath. The history is provided by the patient.  Shortness of Breath Associated symptoms: chest pain and cough   Associated symptoms: no abdominal pain, no headaches, no rash and no vomiting    patient presents with shortness of breath. Began this morning. States he's been out of his albuterol for last 2 days. He has had a mild cough. No fevers. He has had some dull upper chest pain. He has some swelling in his legs doesn't know for [employer]. He is multiple admissions for CHF and COPD. No abdominal pain. No fevers. He states he feels as if his cough something up but has not been able to. Patient was started on BiPAP by EMS. He was started on BiPAP before pulse ox was obtained  Past Medical History  Diagnosis Date  . Hypertension   . Shortness of breath     with exertion  . Diabetes mellitus     does not check his blood sugar at home  . Arthritis   . Chronic kidney disease     renal insuficiency  . CHF (congestive heart failure)   . COPD (chronic obstructive pulmonary disease)    Past Surgical History  Procedure Laterality Date  . Leg surgery      both legs - unsure of exact operations - from trauma  . Tonsillectomy    . Appendectomy     No family history on file. History  Substance Use Topics  . Smoking status: Former Smoker -- 0.50 packs/day    Quit date: 05/14/2009  . Smokeless tobacco: Never Used  . Alcohol Use: No    Review of Systems  Constitutional: Negative for activity change and appetite change.  Eyes: Negative for pain.  Respiratory: Positive for cough and shortness of breath. Negative for chest tightness.   Cardiovascular:  Positive for chest pain and leg swelling.  Gastrointestinal: Negative for nausea, vomiting, abdominal pain and diarrhea.  Genitourinary: Negative for flank pain.  Musculoskeletal: Negative for back pain and neck stiffness.  Skin: Negative for rash.  Neurological: Negative for weakness, numbness and headaches.  Psychiatric/Behavioral: Negative for behavioral problems.    Allergies  Review of patient's allergies indicates no known allergies.  Home Medications   Current Outpatient Rx  Name  Route  Sig  Dispense  Refill  . amLODipine (NORVASC) 5 MG tablet   Oral   Take 5 mg by mouth daily.         Marland Kitchen aspirin EC 81 MG tablet   Oral   Take 81 mg by mouth daily.         . fenofibrate 160 MG tablet   Oral   Take 160 mg by mouth daily.         . furosemide (LASIX) 40 MG tablet   Oral   Take 40 mg by mouth daily.         Marland Kitchen linagliptin (TRADJENTA) 5 MG TABS tablet   Oral   Take 5 mg by mouth daily.         Marland Kitchen lisinopril (ZESTRIL) 2.5 MG tablet   Oral   Take 1 tablet (2.5 mg total) by mouth daily.   30 tablet   1   .  albuterol (PROVENTIL) (2.5 MG/3ML) 0.083% nebulizer solution   Nebulization   Take 3 mLs (2.5 mg total) by nebulization daily.   75 mL   0    BP 161/66  Pulse 87  Temp(Src) 98.6 F (37 C) (Oral)  Resp 19  Ht 5\' 7"  (1.702 m)  Wt 210 lb (95.255 kg)  BMI 32.88 kg/m2  SpO2 96% Physical Exam  Constitutional: He is oriented to person, place, and time. He appears well-developed.  Eyes: Pupils are equal, round, and reactive to light.  Cardiovascular: Normal rate and regular rhythm.   Pulmonary/Chest:  Mild tachypnea. Diffuse wheezes and prolonged expirations with mild rales bilateral bases  Abdominal: Soft. There is no tenderness.  Musculoskeletal: He exhibits edema.  Bilateral lower extremity pitting edema  Neurological: He is alert and oriented to person, place, and time.  Skin: Skin is warm.    ED Course  Procedures (including critical care  time) Labs Review Labs Reviewed  CBC WITH DIFFERENTIAL - Abnormal; Notable for the following:    RBC 3.59 (*)    Hemoglobin 11.5 (*)    HCT 33.2 (*)    Eosinophils Relative 8 (*)    All other components within normal limits  PRO B NATRIURETIC PEPTIDE - Abnormal; Notable for the following:    Pro B Natriuretic peptide (BNP) 2288.0 (*)    All other components within normal limits  POCT I-STAT, CHEM 8 - Abnormal; Notable for the following:    Potassium 3.2 (*)    BUN 26 (*)    Creatinine, Ser 1.80 (*)    Glucose, Bld 180 (*)    Hemoglobin 12.9 (*)    HCT 38.0 (*)    All other components within normal limits   Imaging Review Dg Chest Port 1 View  07/02/2013   CLINICAL DATA:  Difficulty breathing.  EXAM: PORTABLE CHEST - 1 VIEW  COMPARISON:  05/16/2013.  FINDINGS: Mediastinum hilar structures normal. Cardiomegaly with pulmonary vascular prominence and bilateral pulmonary interstitial /alveolar infiltrates. These findings have improved slightly from prior exam. Right mid lung field subsegmental atelectasis present. Small pleural effusions cannot be entirely excluded. No pneumothorax. No acute osseous abnormality.  IMPRESSION: Interim slight resolution of congestive heart failure and pulmonary edema .   Electronically Signed   By: Maisie Fus  Register   On: 07/02/2013 09:00    EKG Interpretation    Date/Time:  Tuesday July 02 2013 08:48:45 EST Ventricular Rate:  96 PR Interval:  176 QRS Duration: 86 QT Interval:  393 QTC Calculation: 497 R Axis:   38 Text Interpretation:  Sinus rhythm Left atrial enlargement Borderline prolonged QT interval No significant change since last tracing            MDM   1. COPD (chronic obstructive pulmonary disease)   2. CHF (congestive heart failure)    Patient with shortness of breath. Likely combination of COPD and CHF. His been out of his albuterol. X-ray is overall reassuring. Patient feels better after breathing treatment. Will increase  patient's Lasix and refill his subdural. He will followup with his PCP in a couple days. Potassium will need to be monitored    Juliet Rude. Rubin Payor, MD 07/02/13 [phone]

## 2013-07-02 NOTE — ED Notes (Addendum)
Reports non prod cough & SOB onset this morning. Ran out of nebulizer 3 days ago. States BLE pitting edema normal for him. States has not had his morning medicines today. Denies CP, fever, chills.

## 2013-07-02 NOTE — ED Notes (Signed)
Pt resting, laying on stretcher watching tv, family at bedside

## 2013-07-02 NOTE — ED Notes (Signed)
Pt undressed, on monitor, continuous pulse oximetry, blood pressure cuff and oxygen [place] (2L)

## 2013-07-02 NOTE — ED Notes (Signed)
Respiratory Therapist at bedside managing cpap at this time

## 2013-07-02 NOTE — ED Notes (Signed)
Family asking the next step; Maxine Glenn, RN aware

## 2013-07-02 NOTE — ED Notes (Signed)
Pt discharged.Vital signs stable and GCS 15.Discharge instruction given. 

## 2013-07-02 NOTE — ED Notes (Signed)
[place], MD d/c'd pt's oxygen [place] (2L) to see how well he stats on RA

## 2013-07-07 ENCOUNTER — Encounter (HOSPITAL_COMMUNITY): Payer: Self-pay | Admitting: Emergency Medicine

## 2013-07-07 ENCOUNTER — Emergency Department (HOSPITAL_COMMUNITY): Payer: Medicare Other

## 2013-07-07 ENCOUNTER — Emergency Department (HOSPITAL_COMMUNITY)
Admission: EM | Admit: 2013-07-07 | Discharge: 2013-07-07 | Disposition: A | Discharge status: Home / Self Care | Payer: Medicare Other | Attending: Emergency Medicine | Admitting: Emergency Medicine

## 2013-07-07 DIAGNOSIS — I129 Hypertensive chronic kidney disease with stage 1 through stage 4 chronic kidney disease, or unspecified chronic kidney disease: Secondary | ICD-10-CM | POA: Insufficient documentation

## 2013-07-07 DIAGNOSIS — E119 Type 2 diabetes mellitus without complications: Secondary | ICD-10-CM | POA: Insufficient documentation

## 2013-07-07 DIAGNOSIS — Z7982 Long term (current) use of aspirin: Secondary | ICD-10-CM | POA: Insufficient documentation

## 2013-07-07 DIAGNOSIS — M129 Arthropathy, unspecified: Secondary | ICD-10-CM | POA: Insufficient documentation

## 2013-07-07 DIAGNOSIS — E876 Hypokalemia: Secondary | ICD-10-CM | POA: Insufficient documentation

## 2013-07-07 DIAGNOSIS — I509 Heart failure, unspecified: Secondary | ICD-10-CM | POA: Insufficient documentation

## 2013-07-07 DIAGNOSIS — J441 Chronic obstructive pulmonary disease with (acute) exacerbation: Secondary | ICD-10-CM | POA: Insufficient documentation

## 2013-07-07 DIAGNOSIS — N189 Chronic kidney disease, unspecified: Secondary | ICD-10-CM | POA: Insufficient documentation

## 2013-07-07 DIAGNOSIS — Z87891 Personal history of nicotine dependence: Secondary | ICD-10-CM | POA: Insufficient documentation

## 2013-07-07 DIAGNOSIS — N289 Disorder of kidney and ureter, unspecified: Secondary | ICD-10-CM | POA: Insufficient documentation

## 2013-07-07 DIAGNOSIS — Z79899 Other long term (current) drug therapy: Secondary | ICD-10-CM | POA: Insufficient documentation

## 2013-07-07 LAB — CBC WITH DIFFERENTIAL/PLATELET
Basophils Relative: 1 % (ref 0–1)
Eosinophils Absolute: 1 10*3/uL — ABNORMAL HIGH (ref 0.0–0.7)
Eosinophils Relative: 9 % — ABNORMAL HIGH (ref 0–5)
MCH: 32 pg (ref 26.0–34.0)
MCHC: 34.8 g/dL (ref 30.0–36.0)
MCV: 92 fL (ref 78.0–100.0)
Neutrophils Relative %: 57 % (ref 43–77)
Platelets: 402 10*3/uL — ABNORMAL HIGH (ref 150–400)
RBC: 3.87 MIL/uL — ABNORMAL LOW (ref 4.22–5.81)
RDW: 12.8 % (ref 11.5–15.5)

## 2013-07-07 LAB — BASIC METABOLIC PANEL
BUN: 16 mg/dL (ref 6–23)
CO2: 26 mEq/L (ref 19–32)
Calcium: 8.9 mg/dL (ref 8.4–10.5)
Creatinine, Ser: 1.36 mg/dL — ABNORMAL HIGH (ref 0.50–1.35)
GFR calc non Af Amer: 48 mL/min — ABNORMAL LOW (ref >90)
Sodium: 134 mEq/L — ABNORMAL LOW (ref 135–145)

## 2013-07-07 LAB — PRO B NATRIURETIC PEPTIDE: Pro B Natriuretic peptide (BNP): 6822 pg/mL — ABNORMAL HIGH (ref 0–450)

## 2013-07-07 LAB — POCT I-STAT TROPONIN I: Troponin i, poc: 0.08 ng/mL (ref 0.00–0.08)

## 2013-07-07 MED ORDER — ALBUTEROL SULFATE (5 MG/ML) 0.5% IN NEBU
5.0000 mg | INHALATION_SOLUTION | Freq: Once | RESPIRATORY_TRACT | Status: AC
Start: 2013-07-07 — End: 2013-07-07
  Administered 2013-07-07: 5 mg via RESPIRATORY_TRACT

## 2013-07-07 MED ORDER — IPRATROPIUM BROMIDE 0.02 % IN SOLN
RESPIRATORY_TRACT | Status: AC
  Filled 2013-07-07: qty 2.5

## 2013-07-07 MED ORDER — IPRATROPIUM BROMIDE 0.02 % IN SOLN
0.5000 mg | Freq: Once | RESPIRATORY_TRACT | Status: AC
  Administered 2013-07-07: 0.5 mg via RESPIRATORY_TRACT

## 2013-07-07 MED ORDER — POTASSIUM CHLORIDE CRYS ER 20 MEQ PO TBCR
40.0000 meq | EXTENDED_RELEASE_TABLET | Freq: Once | ORAL | Status: AC
  Administered 2013-07-07: 40 meq via ORAL
  Filled 2013-07-07: qty 2

## 2013-07-07 MED ORDER — ALBUTEROL SULFATE (5 MG/ML) 0.5% IN NEBU
INHALATION_SOLUTION | RESPIRATORY_TRACT | Status: AC
  Filled 2013-07-07: qty 1

## 2013-07-07 MED ORDER — POTASSIUM CHLORIDE CRYS ER 20 MEQ PO TBCR: 40.0000 meq | EXTENDED_RELEASE_TABLET | Freq: Two times a day (BID) | ORAL | Status: DC

## 2013-07-07 MED ORDER — FUROSEMIDE 10 MG/ML IJ SOLN
80.0000 mg | Freq: Once | INTRAMUSCULAR | Status: AC
  Administered 2013-07-07: 80 mg via INTRAVENOUS
  Filled 2013-07-07: qty 8

## 2013-07-07 NOTE — ED Notes (Addendum)
Per EMS, pt had increasing SOB throughout today, pt reported having cough with no production, Pt used albuterol nebulizer at home with no relief. Pt given 1 nitro tablet in route. Hx of CHF. Pt arrived on C-pap.

## 2013-07-07 NOTE — ED Provider Notes (Signed)
CSN: [id number]     Arrival date & time 07/07/13  1936 History   First MD Initiated Contact with Patient 07/07/13 2000     Chief Complaint  Patient presents with  . Shortness of Breath   (Consider location/radiation/quality/duration/timing/severity/associated sxs/prior Treatment) Patient is a 79 y.o. male presenting with shortness of breath. The history is provided by the patient.  Shortness of Breath He has a history of COPD and CHF. He complains of difficulty breathing and nonproductive cough since midnight. He denies chest pain, heaviness, tightness, pressure. Denies fever or chills. He has not noticed any leg swelling. He tried using his home nebulizer with no relief. He was brought in by ambulance on CPAP and he stated that did not seem to help him feel better either. Dyspnea is not affected by body position.  Past Medical History  Diagnosis Date  . Hypertension   . Shortness of breath     with exertion  . Diabetes mellitus     does not check his blood sugar at home  . Arthritis   . Chronic kidney disease     renal insuficiency  . CHF (congestive heart failure)   . COPD (chronic obstructive pulmonary disease)    Past Surgical History  Procedure Laterality Date  . Leg surgery      both legs - unsure of exact operations - from trauma  . Tonsillectomy    . Appendectomy     No family history on file. History  Substance Use Topics  . Smoking status: Former Smoker -- 0.50 packs/day    Quit date: 05/14/2009  . Smokeless tobacco: Never Used  . Alcohol Use: No    Review of Systems  Respiratory: Positive for shortness of breath.   All other systems reviewed and are negative.    Allergies  Review of patient's allergies indicates no known allergies.  Home Medications   Current Outpatient Rx  Name  Route  Sig  Dispense  Refill  . albuterol (PROVENTIL) (2.5 MG/3ML) 0.083% nebulizer solution   Nebulization   Take 3 mLs (2.5 mg total) by nebulization daily.   75 mL    0   . amLODipine (NORVASC) 5 MG tablet   Oral   Take 5 mg by mouth daily.         Marland Kitchen aspirin EC 81 MG tablet   Oral   Take 81 mg by mouth daily.         . fenofibrate 160 MG tablet   Oral   Take 160 mg by mouth daily.         . furosemide (LASIX) 40 MG tablet   Oral   Take 40 mg by mouth daily.         Marland Kitchen linagliptin (TRADJENTA) 5 MG TABS tablet   Oral   Take 5 mg by mouth daily.         Marland Kitchen lisinopril (ZESTRIL) 2.5 MG tablet   Oral   Take 1 tablet (2.5 mg total) by mouth daily.   30 tablet   1    BP 208/76  Pulse 95  Resp 29  SpO2 97% Physical Exam  Nursing note and vitals reviewed.  79 year old male, resting comfortably and in no acute distress. Vital signs are significant for hypertension with blood pressure 208/76, and tachypnea with respiratory rate of 29. Oxygen saturation is 97%, which is normal. Head is normocephalic and atraumatic. PERRLA, EOMI. Oropharynx is clear. Neck is nontender and supple without adenopathy . JVD is  present at 90. Back is nontender and there is no CVA tenderness. Lungs have rales at the right base, and mild, diffuse wheezing. Chest is nontender. Heart has regular rate and rhythm without murmur. Abdomen is soft, flat, nontender without masses or hepatosplenomegaly and peristalsis is normoactive. Extremities 1+edema with trace presacral edema, full range of motion is present. Skin is warm and dry without rash. Neurologic: Mental status is normal, cranial nerves are intact, there are no motor or sensory deficits.  ED Course  Procedures (including critical care time) Labs Review Results for orders placed during the hospital encounter of 07/07/13  CBC WITH DIFFERENTIAL      Result Value Range   WBC 10.6 (*) 4.0 - 10.5 K/uL   RBC 3.87 (*) 4.22 - 5.81 MIL/uL   Hemoglobin 12.4 (*) 13.0 - 17.0 g/dL   HCT 46.9 (*) 62.9 - 52.8 %   MCV 92.0  78.0 - 100.0 fL   MCH 32.0  26.0 - 34.0 pg   MCHC 34.8  30.0 - 36.0 g/dL   RDW 41.3  24.4  - 01.0 %   Platelets 402 (*) 150 - 400 K/uL   Neutrophils Relative % 57  43 - 77 %   Neutro Abs 6.0  1.7 - 7.7 K/uL   Lymphocytes Relative 25  12 - 46 %   Lymphs Abs 2.7  0.7 - 4.0 K/uL   Monocytes Relative 8  3 - 12 %   Monocytes Absolute 0.9  0.1 - 1.0 K/uL   Eosinophils Relative 9 (*) 0 - 5 %   Eosinophils Absolute 1.0 (*) 0.0 - 0.7 K/uL   Basophils Relative 1  0 - 1 %   Basophils Absolute 0.1  0.0 - 0.1 K/uL  PRO B NATRIURETIC PEPTIDE      Result Value Range   Pro B Natriuretic peptide (BNP) 6822.0 (*) 0 - 450 pg/mL  BASIC METABOLIC PANEL      Result Value Range   Sodium 134 (*) 135 - 145 mEq/L   Potassium 3.3 (*) 3.5 - 5.1 mEq/L   Chloride 95 (*) 96 - 112 mEq/L   CO2 26  19 - 32 mEq/L   Glucose, Bld 160 (*) 70 - 99 mg/dL   BUN 16  6 - 23 mg/dL   Creatinine, Ser 2.72 (*) 0.50 - 1.35 mg/dL   Calcium 8.9  8.4 - 53.6 mg/dL   GFR calc non Af Amer 48 (*) >90 mL/min   GFR calc Af Amer 55 (*) >90 mL/min  POCT I-STAT TROPONIN I      Result Value Range   Troponin i, poc 0.08  0.00 - 0.08 ng/mL   Comment 3            Imaging Review Dg Chest Portable 1 View  07/07/2013   CLINICAL DATA:  Chest pain and shortness of breath  EXAM: PORTABLE CHEST - 1 VIEW  COMPARISON:  07/02/2013  FINDINGS: Mild to moderate cardiac enlargement. Mild vascular congestion. Discoid atelectasis right mid lung zone. Retrocardiac area not well evaluated. No consolidation appreciated.  IMPRESSION: Cardiac enlargement with vascular congestion but no definite pulmonary edema.   Electronically Signed   By: Esperanza Heir M.D.   On: 07/07/2013 20:13    EKG Interpretation    Date/Time:  Sunday July 07 2013 19:39:38 EST Ventricular Rate:  100 PR Interval:  167 QRS Duration: 83 QT Interval:  377 QTC Calculation: 486 R Axis:   31 Text Interpretation:  Sinus tachycardia Probable left atrial  enlargement Borderline prolonged QT interval When compared with ECG of 07/02/2013, No significant change was found  Confirmed by [facility]  MD, Brodan Grewell ([id number]) on 07/07/2013 7:44:56 PM            MDM   1. CHF exacerbation   2. COPD exacerbation   3. Hypokalemia   4. Renal insufficiency    Dyspnea which is probably a combination of CHF and COPD. He is given another breathing treatment with albuterol and ipratropium and chest x-ray and beta natruretic protein level be checked. Old records are reviewed and he was seen in the ED 1 week ago for similar complaints, and had recent hospitalization for similar complaints which was felt to be due to his congestive heart failure.  After nebulizer treatment with albuterol and ipratropium, he feels much better. However, BNP has shown a significant rise suggesting that his primary problem tonight is CHF. He is given a dose of furosemide and is discharged with instructions to double his dose of furosemide for the next 3 days and followup with his PCP in the next 2-3 days. He is noted to have hypokalemia which is likely to get worse with higher dose of furosemide. He is sent home with prescription for K-Dur, and he is given a dose in the ED prior to discharge.  Dione Booze, MD 07/07/13 [phone]

## 2013-07-25 ENCOUNTER — Ambulatory Visit (INDEPENDENT_AMBULATORY_CARE_PROVIDER_SITE_OTHER): Payer: Medicare Other | Admitting: Internal Medicine

## 2013-07-25 ENCOUNTER — Encounter: Payer: Self-pay | Admitting: Internal Medicine

## 2013-07-25 VITALS — BP 120/70 | HR 71 | Ht 70.0 in | Wt 229.6 lb

## 2013-07-25 DIAGNOSIS — R0609 Other forms of dyspnea: Secondary | ICD-10-CM

## 2013-07-25 DIAGNOSIS — R0600 Dyspnea, unspecified: Secondary | ICD-10-CM | POA: Insufficient documentation

## 2013-07-25 DIAGNOSIS — R0602 Shortness of breath: Secondary | ICD-10-CM

## 2013-07-25 DIAGNOSIS — R911 Solitary pulmonary nodule: Secondary | ICD-10-CM

## 2013-07-25 NOTE — Patient Instructions (Signed)
#  Shortness of breath Unclear why short of breath Do overnight  Oxygen study Do full PFT test called breathing test Do CT chest   High Resolution CT chest without contrast on ILD protocol. Only  Dr Leanna Battles or Dr. Trudie Reed to read  #Lung nodule 6mm Left Upper lobe in Jan 2013  - do fu High Resolution CT chest without contrast on ILD protocol. Only  Dr Leanna Battles or Dr. Trudie Reed to read   #Followup Finish the tests oxygen study, breathing test, CT chest and return to see me orNP next few weeks

## 2013-07-25 NOTE — Assessment & Plan Note (Signed)
#  Shortness of breath Unclear why short of breath but ddx is deconditoning, copd, ILD, and corpulmonale  PLAN Do overnight  Oxygen study Do full PFT test called breathing test Do CT chest   High Resolution CT chest without contrast on ILD protocol. Only  Dr Leanna Battles or Dr. Trudie Reed to read

## 2013-07-25 NOTE — Assessment & Plan Note (Signed)
 #  Lung nodule 6mm Left Upper lobe in Jan 2013  - do fu High Resolution CT chest without contrast on ILD protocol. Only  Dr Leanna Battles or Dr. Trudie Reed to read   #Followup Finish the tests oxygen study, breathing test, CT chest and return to see me orNP next few weeks

## 2013-07-25 NOTE — Progress Notes (Signed)
Subjective:    Patient ID: [name] [name], male    DOB: 05/09/1931, 79 y.o.   MRN: [id number] IOV 07/25/2013 PCP Alva Garnet., MD REferred by Jeanella Cara HPI   IOV 07/25/2013  Chief Complaint  Patient presents with  . Pulmonary Consult    for SOB with activity. States PCP started him on oxygen to use with exertion and at bedtime.    79 year old [place] American male referred for dyspnea. He is a poor historian as is his caretaker is. I cannot get much information out of him. The best I can gather is that he said insidious onset of dyspnea for the last 2 months. Worsened with exertion for minimal activities. Relieved by rest. Dyspnea is rated as moderate. It got worse with admission to the hospital in October 2014 for heart failure but since then has improved but is still persistent and stable. There is associated wheeze but no associated chest pain or edema. Though he maintained that he gets dyspneic for minimal activities and apparently yesterday he desaturated in the primary care physician's office. However, today we walked him 108 5 feet x2 laps on room air. He stopped due to claudication of his lower extremities and not due to dyspnea. He did not desaturate.  Relevant investigations are as below   Pulmonary Relevant Hx  Smoking:  reports that he quit smoking about 4 years ago. His smoking use included Cigarettes. He has a 35 pack-year smoking history. He has never used smokeless tobacco.  CT cest Jan 2013 reviewed image: 6mm LUL inferior lobe nodule. Pleural thickening at base. Not hadd FU CT  WEight Body mass index is 32.94 kg/(m^2).  CKD STage 3, creat 1.36 Nov 2014  October 2014 due to decompensated congestive heart failure not otherwise specified with associated acute respiratory failure. Also not otherwise specified. Followed with Dr. Kathaleen Grinder 07/09/2013 and found to have been improved  Relevant cardiac investigations include echocardiogram 03/25/2013 with normal LV  systolic ejection fraction 55%. Severe pulmonary hypertension. PA systolic pressure 61.  BNP at this time 12K  Myoview stress test 05/10/2013: Stress test was terminated due to completion of protocol. Left ventricle was mild to moderately dilated at both rest and stress. There was diaphragmatic attenuation but statistically no significant change you. Left ventricle ejection fraction was 40%. Low risk study it is the conclusion   CXR Nov 2014: CHF  V  ILD (pro-BNP 6K at this time)  Past Medical History  Diagnosis Date  . Hypertension   . Shortness of breath     with exertion  . Diabetes mellitus     does not check his blood sugar at home  . Arthritis   . Chronic kidney disease     renal insuficiency  . CHF (congestive heart failure)   . COPD (chronic obstructive pulmonary disease)   . Hyperlipidemia      Family History  Problem Relation Age of Onset  . Diabetes Father   . Diabetes Mother      History   Social History  . Marital Status: Single    Spouse Name: N/A    Number of Children: N/A  . Years of Education: N/A   Occupational History  . Not on file.   Social History Main Topics  . Smoking status: Former Smoker -- 0.50 packs/day for 70 years    Types: Cigarettes    Quit date: 05/14/2009  . Smokeless tobacco: Never Used  . Alcohol Use: No  . Drug Use: No  Comment: quit marijuana 3-4 years ago  . Sexual Activity: No   Other Topics Concern  . Not on file   Social History Narrative  . No narrative on file     No Known Allergies   Outpatient Prescriptions Prior to Visit  Medication Sig Dispense Refill  . albuterol (PROVENTIL) (2.5 MG/3ML) 0.083% nebulizer solution Take 3 mLs (2.5 mg total) by nebulization daily.  75 mL  0  . amLODipine (NORVASC) 5 MG tablet Take 5 mg by mouth daily.      Marland Kitchen aspirin EC 81 MG tablet Take 81 mg by mouth daily.      . fenofibrate 160 MG tablet Take 160 mg by mouth daily.      . furosemide (LASIX) 40 MG tablet Take 40 mg by  mouth daily.      Marland Kitchen linagliptin (TRADJENTA) 5 MG TABS tablet Take 5 mg by mouth daily.      Marland Kitchen lisinopril (ZESTRIL) 2.5 MG tablet Take 1 tablet (2.5 mg total) by mouth daily.  30 tablet  1  . metoprolol succinate (TOPROL-XL) 25 MG 24 hr tablet Take 25 mg by mouth daily.      . repaglinide (PRANDIN) 0.5 MG tablet Take 0.5 mg by mouth 2 (two) times daily before a meal.       . TRAVATAN Z 0.004 % SOLN ophthalmic solution Place 1 drop into both eyes daily.      . potassium chloride SA (K-DUR,KLOR-CON) 20 MEQ tablet Take 2 tablets (40 mEq total) by mouth 2 (two) times daily.  20 tablet  0   No facility-administered medications prior to visit.       Review of Systems  Constitutional: Negative for fever and unexpected weight change.  HENT: Negative for congestion, dental problem, ear pain, nosebleeds, postnasal drip, rhinorrhea, sinus pressure, sneezing, sore throat and trouble swallowing.   Eyes: Negative for redness and itching.  Respiratory: Positive for cough and shortness of breath. Negative for chest tightness and wheezing.   Cardiovascular: Negative for palpitations and leg swelling.  Gastrointestinal: Negative for nausea and vomiting.  Genitourinary: Negative for dysuria.  Musculoskeletal: Negative for joint swelling.  Skin: Negative for rash.  Neurological: Negative for headaches.  Hematological: Does not bruise/bleed easily.  Psychiatric/Behavioral: Negative for dysphoric mood. The patient is not nervous/anxious.        Objective:   Physical Exam  Nursing note and vitals reviewed. Constitutional: He is oriented to person, place, and time. He appears well-developed and well-nourished. No distress.  Body mass index is 32.94 kg/(m^2). Looks deconditioned Wears glasses  HENT:  Head: Normocephalic and atraumatic.  Right Ear: External ear normal.  Left Ear: External ear normal.  Mouth/Throat: Oropharynx is clear and moist. No oropharyngeal exudate.  Eyes: Conjunctivae and EOM  are normal. Pupils are equal, round, and reactive to light. Right eye exhibits no discharge. Left eye exhibits no discharge. No scleral icterus.  Neck: Normal range of motion. Neck supple. No JVD present. No tracheal deviation present. No thyromegaly present.  Cardiovascular: Normal rate, regular rhythm and intact distal pulses.  Exam reveals no gallop and no friction rub.   No murmur heard. Pulmonary/Chest: Effort normal. No respiratory distress. He has no wheezes. He has rales. He exhibits no tenderness.  Mild crackles a base  Abdominal: Soft. Bowel sounds are normal. He exhibits no distension and no mass. There is no tenderness. There is no rebound and no guarding.  Musculoskeletal: Normal range of motion. He exhibits no edema and no tenderness.  Uses cane to walk  Lymphadenopathy:    He has no cervical adenopathy.  Neurological: He is alert and oriented to person, place, and time. He has normal reflexes. No cranial nerve deficit. Coordination normal.  Skin: Skin is warm and dry. No rash noted. He is not diaphoretic. No erythema. No pallor.  Psychiatric: He has a normal mood and affect. His behavior is normal. Judgment and thought content normal.          Assessment & Plan:

## 2013-07-26 ENCOUNTER — Other Ambulatory Visit: Payer: Self-pay | Admitting: Ophthalmology

## 2013-07-26 NOTE — H&P (Signed)
Patient Record  [name] [name], [name] [name]  Patient Number:  [id number] Date of Birth:  [birth year] Age:  79 years old    Gender:  Male Date of Evaluation:  July 26, 2013  Chief Complaint:   79 year old male is here for Cataract evaluation.  He reports difficulty seeing in the distance, cannot recognize peoples faces or see road signs. History of Present Illness:   79 yo maleDiabetic has been hospitalized since last visit.  Hospitalized because of congestive heart failur.  Vision has gotten worsse since last visit.  Presents for evaluation. Past History:  Allergies:  nkda, Active Medications:   Other Medications:  Travatan Z (travoprost) by Nadyne Coombes, Drops 0.004% 60, 3 milliliter, Start Date:09/21/2011, Notes: INSTILL ONE DROP INTO LEFT EYE AT BEDTIME Birth History:  none Past Ocular History:  none Past Medical History:  none Past Surgical History:  none Family History:  no amblyopia, no blindness, + cataracts (mother), no crossed eyes, no diabetic retinopathy, no glaucoma, no macular degeneration, no retinal detachment, + cancer (mother, father), + diabetes (mother), no heart disease, no high blood pressure, no stroke Social History:   Smoking Status: former smoker  Alcohol:  none   Driving status:  not driving Marital status:  widowed Review of Systems:   Eyes: + blurred vision, + decreased vision  All other systems are negative.  Examination:  Visual Acuity:   Distance VA cc:  OD: 20/60-1    OS: 20/70  Distance VA [place]:  OD: 20/200    OS: 20/60 IOP:  OD:  12     OS:  12    @ 10:57AM (Goldmann applanation) Manifest Refraction:    Sphere    Cyl Axis       VA         Add       VA Prism Base R:  +2.25  -2.25  105    20/50       +2.75                      L:  +0.25  -1.00  069    20/60       +2.75                        Confrontation visual field:  OU:  Normal  Motility:  OU:  Normal  Pupils:  OU:  Shape, size, direct and consensual reaction normal  Adnexa:  Preauricular LN,  lacrimal drainage, lacrimal glands, orbit normal  Eyelids:  Eyelids:  2+ dermatochalasis Conjunctiva:  OU:  bulbar, palpebral normal, 3+ dermatochalasis  Cornea:  OU:  epithelium, stroma, endothelium, tear film normal  Anterior Chamber:  OU:  depth normal, no cell, no flare, 3+ deep / clear  Iris:  OU:  normal, rubeosis absent Dilation:  OU: AK-Dilate, Tropicacyl @ 12:06PM  Lens:  OU:  3+ nuclear sclerotic cataract, ,  2+ cortical cataract  Vitreous:  OU:  normal  Optic Disc:  OD:  cupping: 0.65 V x 0.6 H OS:  cupping: 0.7 V x 0.65 H  Macula:  OU:  normal  Vessels:  OU:  normal  Periphery:  OU:  Normal  A Scan / IOLMaster:  Ascan predicts +18.50 Acrysof MA50BM PC IOL for emmetropia OS   Orientation to person, place and time:  Normal  Mood and affect:  Normal  Impression:  366.19  Combined Cataract OU 365.11  Primary Open Angle Glaucoma OU  365.72  -Glaucoma, moderate  250.00  Diabetes, type II, without complicatons (not stated as controlled) OU  Plan/Treatment:  Cataract: We discussed the natural history of Cataracts with illustrations. We discussed the related symptoms , visual significance and when we intervene with surgery. We discussed the surgical techniques used, risks and benefits of surgery.  He desires to proceed with Cataract surgery for his left eye which bothers him most.  He indicated understanding our discussion and felt that his questions had been answered to his satisfaction.  Glass prescription given:    Sphere    Cyl Axis          Add  Prism Base R:  +2.25  -2.25  105        +2.75              L:  +0.25  -1.00   69        +2.75                Medications:   Given e-prescription for: continue travatanNone. Refer to:  Melida Quitter, MD-[address] [place], [place] [address] for cataract surgery os Send letter to:  Oleh Genin, MD, [address], [place], [place] [address]  cc: Dr Andi Devon, MD, [facility], [address], [place], [place] [address] Return to clinic:  53month   (electronically signed)  Shade Flood, MD

## 2013-07-31 ENCOUNTER — Ambulatory Visit (INDEPENDENT_AMBULATORY_CARE_PROVIDER_SITE_OTHER)
Admission: RE | Admit: 2013-07-31 | Discharge: 2013-07-31 | Disposition: A | Discharge status: Home / Self Care | Payer: PRIVATE HEALTH INSURANCE | Source: Ambulatory Visit | Attending: Internal Medicine | Admitting: Internal Medicine

## 2013-07-31 DIAGNOSIS — R911 Solitary pulmonary nodule: Secondary | ICD-10-CM

## 2013-07-31 DIAGNOSIS — R0602 Shortness of breath: Secondary | ICD-10-CM

## 2013-08-04 ENCOUNTER — Telehealth: Payer: Self-pay | Admitting: Internal Medicine

## 2013-08-04 NOTE — Telephone Encounter (Signed)
Tammy you are seeing this patient mid Jan 2015. CT shows ILD. At this age even though his CT is not c/w UIP/IPF given the age it is more likely to be UIP/IPF. In an ideal world he needs lung bx but his pulm htn precludes him from safe biopsy. I recommend you send off  Serum: ESR, ANA, DS-DNA, RF, anti-CCP, ssA, ssB, scl-70, ANCA, Total CK,  Aldolase,  Hypersensitivity Pneumonitis Panel  And then set up fu with me  If above are negative, Iwill label him as IPF/UIP abd  I will talk to him about Pirfenidone   Thanks   Dr. Kalman Shan, M.D., [facility].C.P Pulmonary and Critical Care Medicine Staff Physician [facility] Pager: [phone], If no answer or between  15:00h - 7:00h: call [phone]  08/04/2013 4:39 PM

## 2013-08-05 ENCOUNTER — Other Ambulatory Visit (HOSPITAL_COMMUNITY): Payer: Self-pay | Admitting: *Deleted

## 2013-08-06 ENCOUNTER — Encounter (HOSPITAL_COMMUNITY)
Admission: RE | Admit: 2013-08-06 | Discharge: 2013-08-06 | Disposition: A | Discharge status: Home / Self Care | Payer: PRIVATE HEALTH INSURANCE | Source: Ambulatory Visit | Attending: Nephrology | Admitting: Nephrology

## 2013-08-06 DIAGNOSIS — E1129 Type 2 diabetes mellitus with other diabetic kidney complication: Secondary | ICD-10-CM | POA: Diagnosis not present

## 2013-08-06 DIAGNOSIS — I129 Hypertensive chronic kidney disease with stage 1 through stage 4 chronic kidney disease, or unspecified chronic kidney disease: Secondary | ICD-10-CM | POA: Insufficient documentation

## 2013-08-06 DIAGNOSIS — N183 Chronic kidney disease, stage 3 unspecified: Secondary | ICD-10-CM | POA: Insufficient documentation

## 2013-08-06 MED ORDER — SODIUM CHLORIDE 0.9 % IV SOLN
1020.0000 mg | Freq: Once | INTRAVENOUS | Status: AC
  Administered 2013-08-06: 1020 mg via INTRAVENOUS
  Filled 2013-08-06: qty 34

## 2013-08-07 ENCOUNTER — Other Ambulatory Visit (HOSPITAL_COMMUNITY): Payer: Medicare Other

## 2013-08-10 NOTE — Pre-Procedure Instructions (Signed)
[name] [name]  08/10/2013   Your procedure is scheduled on:  December 31  Report to [facility] Entrance "A" [address] at 11:45 AM.  Call this number if you have problems the morning of surgery: [phone]   Remember:   Do not eat food or drink liquids after midnight.   Take these medicines the morning of surgery with A SIP OF WATER: Albuterol, Amlodipine, benzonatate (if needed), Metoprolol, Eye drops   Do not wear jewelry, make-up or nail polish.  Do not wear lotions, powders, or perfumes. You may wear deodorant.  Do not shave 48 hours prior to surgery. Men may shave face and neck.  Do not bring valuables to the hospital.  [facility] is not responsible                  for any belongings or valuables.               Contacts, dentures or bridgework may not be worn into surgery.  Leave suitcase in the car. After surgery it may be brought to your room.  For patients admitted to the hospital, discharge time is determined by your                treatment team.               Patients discharged the day of surgery will not be allowed to drive  home.  Name and phone number of your driver: Family/ Friend  Special Instructions: Shower using CHG 2 nights before surgery and the night before surgery.  If you shower the day of surgery use CHG.  Use special wash - you have one bottle of CHG for all showers.  You should use approximately 1/3 of the bottle for each shower.   Please read over the following fact sheets that you were given: Pain Booklet, Coughing and Deep Breathing and Surgical Site Infection Prevention

## 2013-08-12 ENCOUNTER — Encounter (HOSPITAL_COMMUNITY): Payer: Self-pay

## 2013-08-12 ENCOUNTER — Encounter (HOSPITAL_COMMUNITY): Payer: Self-pay | Admitting: Vascular Surgery

## 2013-08-12 ENCOUNTER — Encounter (HOSPITAL_COMMUNITY)
Admission: RE | Admit: 2013-08-12 | Discharge: 2013-08-12 | Disposition: A | Discharge status: Home / Self Care | Payer: PRIVATE HEALTH INSURANCE | Source: Ambulatory Visit | Attending: Ophthalmology | Admitting: Ophthalmology

## 2013-08-12 DIAGNOSIS — Z01812 Encounter for preprocedural laboratory examination: Secondary | ICD-10-CM | POA: Insufficient documentation

## 2013-08-12 DIAGNOSIS — Z01818 Encounter for other preprocedural examination: Secondary | ICD-10-CM | POA: Insufficient documentation

## 2013-08-12 LAB — CBC
HCT: 35.5 % — ABNORMAL LOW (ref 39.0–52.0)
MCH: 31.4 pg (ref 26.0–34.0)
MCHC: 33.8 g/dL (ref 30.0–36.0)
Platelets: 295 10*3/uL (ref 150–400)
RDW: 12.6 % (ref 11.5–15.5)
WBC: 7.2 10*3/uL (ref 4.0–10.5)

## 2013-08-12 LAB — COMPREHENSIVE METABOLIC PANEL
AST: 52 U/L — ABNORMAL HIGH (ref 0–37)
Albumin: 3.5 g/dL (ref 3.5–5.2)
Alkaline Phosphatase: 31 U/L — ABNORMAL LOW (ref 39–117)
BUN: 25 mg/dL — ABNORMAL HIGH (ref 6–23)
Calcium: 8.8 mg/dL (ref 8.4–10.5)
GFR calc Af Amer: 43 mL/min — ABNORMAL LOW (ref >90)
GFR calc non Af Amer: 37 mL/min — ABNORMAL LOW (ref >90)
Glucose, Bld: 168 mg/dL — ABNORMAL HIGH (ref 70–99)
Potassium: 3.8 mEq/L (ref 3.5–5.1)
Total Protein: 7.4 g/dL (ref 6.0–8.3)

## 2013-08-12 NOTE — Progress Notes (Addendum)
Pt with somewhat complicated medical hx.  Has diabetes in which he takes Tradjenta--caregiver states his sugars have been running 100-200 in the mornings.  Sugars are only checked once a day.   He can't remember if he has seen a pulmonary MD.  He states that Dr. Kellie Shropshire (she or her PA) has issued an inhaler to be used.  I did hear the patient wheezing as he signed his permit, and Ms. Excell Seltzer, his caregiver states he does wheeze a lot.  Spoke with Jaynie Collins, PA concerning PMH of this patient.  She will come and evaluate.  DA

## 2013-08-12 NOTE — Progress Notes (Signed)
Anesthesia PAT Evaluation: Patient is a 79 year old male scheduled for left eye cataract extraction by Dr. Clarisa Kindred on 08/14/13. He was initially going to have cataract surgery last year, but anesthesia requested cardiology evaluation for CHF and also medical evaluation due to poorly controlled DM and hyponatremia.  History includes former smoker, obesity, hypertension, diabetes mellitus type 2, COPD, possible UIP on 07/31/13 chest CT with resolution of previously seen LUL nodule, chronic DOE, nocturnal home oxygen at 2L/[place], pulmonary hypertension with PA peak pressure 61 mmHg 03/2013, CHF with hospitalization for acute on chronic diastolic CHF on 05/16/13, arthritis, prior bilateral eg surgery due to trauma, CKD stage III, carotid artery stenosis. PCP is Dr. Andi Devon. Cardiologist is Dr. Jacinto Halim who cleared patient from a cardiac standpoint.  He did refer patient to pulmonologist is Dr. Marchelle Gearing with first visit on 07/25/13.  I called to speak with Dr. Marchelle Gearing today, but he is out of town until 08/19/13.   Meds: albuterol, amlodipine, ASA, Tessalon, fenofibrate, Lasix, Tradjenta, lisinopril, Toprol XL, KCL, Prandin, Travatan.  Patient presents with his home aide.  He is in a wheelchair and reports limited mobility due to his legs (from previous trauma).  He feels at his baseline from a cardiopulmonary standpoint.  He did have intermittent audible wheezing, which is reported as his baseline.  He did not appear acutely SOB and was without conversational dyspnea.  Lungs sounds were clear at the time of auscultation.  Heart RRR.  No significant LE edema.  He uses his nebulizer typically 1-3X/day.  He denied recent URI symptoms or fever.  He denies chest pain or new SOB and edema. He sleeps primarily on his stomach.  He was unsure if he could lie flat but thought he probably could.      EKG on 07/07/13 showed ST @ 100 bpm, probable LAE, borderline prolonged QT.   Myoview stress test 05/10/2013 (Dr.  Jacinto Halim): Stress test was terminated due to completion of protocol. Left ventricle was mild to moderately dilated at both rest and stress. There was prominent diaphragmatic attenuation artifact in the inferior an inferoapical wall.  A moderate sized scar in the same area cannot be completely excluded, but no statistically significant ischemia.  Dynamic gated images reveal mild diffuse global hypokinesis and endocardial thickening.  LVEF was estimated to be 40%.  This represents a low risk study.  Echo on 03/25/13 showed: - Left ventricle: The cavity size was normal. Wall thickness was increased in a pattern of mild LVH. There was moderate focal basal hypertrophy of the septum. Systolic function was normal. The estimated ejection fraction was in the range of 50% to 55%. Wall motion was normal; there were no regional wall motion abnormalities. - Aortic valve: Trivial regurgitation. - Mitral valve: Calcified annulus. Moderate regurgitation. - Left atrium: The atrium was mildly dilated. - Pulmonic valve: Trivial regurgitation. - Tricuspid valve: Mild regurgitation. - Pulmonary arteries: Systolic pressure was moderately increased. PA peak pressure: 61mm Hg (S).  Carotid duplex on 06/03/13 showed moderate stenosis of the right distal ICA, mid ICA, and proximal ICA (50-69%).  Mild stenosis of the right bulb (16-49%), right ECA stenosis of > 50%, probably > 70%.  No evidence of hemodynamically significant stenosis in the left carotid bifurcation vessels.   High resolution Chest CT on 07/31/13 showed:  1. The appearance of the lungs is compatible with an interstitial lung disease. Given the progressive changes compared to the prior study and the possibility of early honeycombing, these findings may indicate early usual interstitial  pneumonia (UIP), however, this is not yet definitive UIP. This may alternatively represent nonspecific interstitial pneumonia (NSIP). Correlation with open lung biopsy may provide  additional diagnostic information if clinically indicated. Alternatively, these findings could be followed with repeat high-resolution chest CT in 6-12 months to assess for temporal progression of disease.  2. Interval development of small bilateral pleural effusions (right greater than left).  3. Mediastinal lymphadenopathy is nonspecific, and may be simply related to underlying interstitial lung disease. Attention on followup studies is recommended.  4. Atherosclerosis, including left main and 3 vessel coronary artery disease. In addition, the patient has mild aneurysmal dilatation of the ascending thoracic aorta, which is unchanged.  5. Previously noted small nodule in the inferior segment of the lingula has resolved compared to the prior study, likely an area of mucoid impaction on the prior examination. Multiple tiny 2-3 mm nodules are now noted throughout the lungs bilaterally, which are highly nonspecific. This report has been reviewed by Dr. Marchelle Gearing.  Notes in Epic indicated that he felt patient's pulmonary hypertension precludes him from a safe biopsy.  He is planning to order additional testing at patient's follow-up appointment in January.    Previous 1V CXR on 07/07/13 showed cardiac enlargement with vascular congestion but no definite pulmonary edema.  He is scheduled for PFTs and pulmonary follow-up with Rubye Oaks, NP on 08/26/13.  Preoperative labs noted.  BUN/Cr 25/1.67, overall stable.  Glucose 168.  AST 52, ALT 18.  H/H 12.0/35.5.  His DM seems better controlled now (fasting usually 100-150). He has been cleared from a cardiac standpoint.  However, at baseline, patient has intermittent wheezing and intermittent SOB even at rest.  He has pulmonary hypertension and uses home oxygen.  Recent chest CT shows likely UIP--by notes, he is not felt to be a safe candidate for biopsy due to his pulmonary hypertension.  Although case is posted for MAC, general anesthesia (possibly with LMA)  could ultimately be needed.  Dr. Marchelle Gearing is out of the office all this week, so I'm unable to get his input regarding if he thinks patient can tolerate anesthesia/surgery from a pulmonary standpoint.  I reviewed with anesthesiologist Dr. Gelene Mink who agreed that it would be best to postpone until pulmonary input could be obtained preoperatively.  I have notified Dr. Clarisa Kindred.  She will notify OR scheduling.  I have notified patient's primary caregiver Rennis Harding who will notify Mr. Muhammed and cancel his transportation for Wednesday.    Velna Ochs [facility] Short Stay Center/Anesthesiology Phone [phone] 08/12/2013 3:11 PM

## 2013-08-12 NOTE — Progress Notes (Signed)
08/12/13 0913  OBSTRUCTIVE SLEEP APNEA  Have you ever been diagnosed with sleep apnea through a sleep study? No  Do you snore loudly (loud enough to be heard through closed doors)?  1  Do you often feel tired, fatigued, or sleepy during the daytime? (tires easily when he walks          wears oxygen at night mainly)  Has anyone observed you stop breathing during your sleep? 0  Do you have, or are you being treated for high blood pressure? 1  BMI more than 35 kg/m2? 0  Age over 44 years old? 1  Neck circumference greater than 40 cm/18 inches? 0  Gender: 1  Obstructive Sleep Apnea Score 4  Score 4 or greater  Results sent to PCP

## 2013-08-14 ENCOUNTER — Encounter (HOSPITAL_COMMUNITY): Admission: RE | Payer: Self-pay | Source: Ambulatory Visit

## 2013-08-14 ENCOUNTER — Ambulatory Visit (HOSPITAL_COMMUNITY)
Admission: RE | Admit: 2013-08-14 | Payer: PRIVATE HEALTH INSURANCE | Source: Ambulatory Visit | Admitting: Ophthalmology

## 2013-08-14 SURGERY — PHACOEMULSIFICATION, CATARACT, WITH IOL INSERTION
Anesthesia: Monitor Anesthesia Care | Laterality: Left

## 2013-08-16 ENCOUNTER — Other Ambulatory Visit: Payer: Self-pay | Admitting: Ophthalmology

## 2013-08-23 NOTE — H&P (Signed)
Patient Record  [name] [name], [name] [name]  Patient Number:  [id number] Date of Birth:  [birth year] Age:  80 years old    Gender:  Male Date of Evaluation:  August 23, 2013  Chief Complaint:   80 year old male is here for Cataract evaluation.  He reports difficulty seeing in the distance, cannot recognize peoples faces or see road signs. He has been cleared from a respiratory standpoint as well as from a cardiology standpoint.  History of Present Illness:   80 yo maleDiabetic has been hospitalized since last visit.  Hospitalized because of congestive heart failur.  Vision has gotten worsse since last visit.  Presents for evaluation. Past History:  Allergies:  nkda, Active Medications:   Other Medications:  Travatan Z (travoprost) by Ara Kussmaul, Drops 0.004% 60, 3 milliliter, Start Date:09/21/2011, Notes: INSTILL ONE DROP INTO LEFT EYE AT BEDTIME Birth History:  none Past Ocular History:  none Past Medical History:  none Past Surgical History:  none Family History:  no amblyopia, no blindness, + cataracts (mother), no crossed eyes, no diabetic retinopathy, no glaucoma, no macular degeneration, no retinal detachment, + cancer (mother, father), + diabetes (mother), no heart disease, no high blood pressure, no stroke Social History:   Smoking Status: former smoker  Alcohol:  none   Driving status:  not driving Marital status:  widowed Review of Systems:   Eyes: + blurred vision, + decreased vision  All other systems are negative.  Examination:  Visual Acuity:   Distance VA cc:  OD: 20/60-1    OS: 20/70  Distance VA [place]:  OD: 20/200    OS: 20/60 IOP:  OD:  13     OS:  14    @ 02:02PM (Goldmann applanation) Manifest Refraction:    Sphere    Cyl Axis       VA         Add       VA Prism Base R:  +2.25  -2.25  105    20/50       +2.75                      L:  +0.25  -1.00  069    20/60       +2.75                        Confrontation visual field:  OU:  Normal  Motility:  OU:  Normal    Pupils:  OU:  Shape, size, direct and consensual reaction normal  Adnexa:  Preauricular LN, lacrimal drainage, lacrimal glands, orbit normal  Eyelids:  Eyelids:  2+ dermatochalasis Conjunctiva:  OU:  bulbar, palpebral normal, 3+ dermatochalasis  Cornea:  OU:  epithelium, stroma, endothelium, tear film normal  Anterior Chamber:  OU:  depth normal, no cell, no flare, 3+ deep / clear  Iris:  OU:  normal, rubeosis absent Dilation:  OU: AK-Dilate, Tropicacyl @ 12:06PM  Lens:  OU:  3+ nuclear sclerotic cataract, ,  2+ cortical cataract  Vitreous:  OU:  normal  Optic Disc:  OD:  cupping: 0.65 V x 0.6 H OS:  cupping: 0.7 V x 0.65 H  Macula:  OU:  normal  Vessels:  OU:  normal  Periphery:  OU:  normal A Scan / IOLMaster:  Ascan predicts +18.50 pC IOL for emmetropia OS  Orientation to person, place and time:  Normal  Mood and affect:  Normal  Impression:  366.19  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 We discussed the related symptoms , visual significance and when we intervene with surgery. We discussed the surgical techniques used, risks and benefits of surgery.  He desires to proceed with Cataract surgery for his left eye which bothers him most.  He indicated understanding our discussion and felt that his questions had been answered to his satisfaction.   Medications:   Continue Travatan Patient Instructions: Please do not eat anything after mignight the day before surgery. She may take morning medications with a sip of water. Return to clinic:  09/05/2013 for post-operative follow-up  Schedule:  Phacoemulsification, Posterior Chamber Intraocular Lens OS x 09/04/2013   (electronically signed)  Adonis Brook, MD

## 2013-08-26 ENCOUNTER — Ambulatory Visit: Payer: Medicare Other | Admitting: Adult Health

## 2013-08-27 ENCOUNTER — Ambulatory Visit (INDEPENDENT_AMBULATORY_CARE_PROVIDER_SITE_OTHER): Payer: PRIVATE HEALTH INSURANCE | Admitting: Internal Medicine

## 2013-08-27 DIAGNOSIS — R0602 Shortness of breath: Secondary | ICD-10-CM

## 2013-08-27 LAB — PULMONARY FUNCTION TEST
DL/VA % pred: 88 %
DL/VA: 4.04 ml/min/mmHg/L
DLCO UNC: 14.57 ml/min/mmHg
DLCO unc % pred: 45 %
FEF 25-75 POST: 0.85 L/s
FEF 25-75 PRE: 0.62 L/s
FEF2575-%Change-Post: 37 %
FEF2575-%Pred-Post: 42 %
FEF2575-%Pred-Pre: 30 %
FEV1-%Change-Post: 9 %
FEV1-%PRED-PRE: 46 %
FEV1-%Pred-Post: 51 %
FEV1-POST: 1.36 L
FEV1-Pre: 1.24 L
FEV1FVC-%CHANGE-POST: 3 %
FEV1FVC-%Pred-Pre: 85 %
FEV6-%CHANGE-POST: 3 %
FEV6-%PRED-POST: 58 %
FEV6-%Pred-Pre: 57 %
FEV6-Post: 2.02 L
FEV6-Pre: 1.96 L
FEV6FVC-%Change-Post: -2 %
FEV6FVC-%PRED-POST: 104 %
FEV6FVC-%Pred-Pre: 106 %
FVC-%Change-Post: 5 %
FVC-%PRED-POST: 56 %
FVC-%Pred-Pre: 53 %
FVC-PRE: 1.96 L
FVC-Post: 2.06 L
Post FEV1/FVC ratio: 66 %
Post FEV6/FVC ratio: 98 %
Pre FEV1/FVC ratio: 63 %
Pre FEV6/FVC Ratio: 100 %

## 2013-08-27 NOTE — Progress Notes (Signed)
PFT done today. 

## 2013-08-30 ENCOUNTER — Ambulatory Visit (INDEPENDENT_AMBULATORY_CARE_PROVIDER_SITE_OTHER): Payer: Medicare Other | Admitting: Internal Medicine

## 2013-08-30 ENCOUNTER — Encounter: Payer: Self-pay | Admitting: Internal Medicine

## 2013-08-30 VITALS — BP 140/62 | HR 80 | Temp 98.2°F | Ht 69.0 in | Wt 233.0 lb

## 2013-08-30 DIAGNOSIS — R0989 Other specified symptoms and signs involving the circulatory and respiratory systems: Secondary | ICD-10-CM

## 2013-08-30 DIAGNOSIS — R0609 Other forms of dyspnea: Secondary | ICD-10-CM

## 2013-08-30 DIAGNOSIS — I10 Essential (primary) hypertension: Secondary | ICD-10-CM

## 2013-08-30 DIAGNOSIS — R0600 Dyspnea, unspecified: Secondary | ICD-10-CM

## 2013-08-30 NOTE — Patient Instructions (Signed)
You are cleared for eye surgery  Please schedule a follow up office visit in 6 weeks, call sooner if needed with Dr Chase Caller to review your progress

## 2013-08-30 NOTE — Progress Notes (Signed)
Subjective:    Patient ID: [name] [name], male    DOB: 1930-09-21, 80 y.o.   MRN: [id number] IOV 07/25/2013 PCP [name], MD REferred by [name] HPI   IOV 07/25/2013  Chief Complaint  Patient presents with  . Pulmonary Consult    for SOB with activity. States PCP started him on oxygen to use with exertion and at bedtime.    80 year old [place] American male referred for dyspnea. He is a poor historian as is his caretaker is. I cannot get much information out of him. The best I can gather is that he said insidious onset of dyspnea for the last 2 months. Worsened with exertion for minimal activities. Relieved by rest. Dyspnea is rated as moderate. It got worse with admission to the hospital in October 2014 for heart failure but since then has improved but is still persistent and stable. There is associated wheeze but no associated chest pain or edema. Though he maintained that he gets dyspneic for minimal activities and apparently yesterday he desaturated in the primary care physician's office. However, today we walked him 108 5 feet x2 laps on room air. He stopped due to claudication of his lower extremities and not due to dyspnea. He did not desaturate.  Relevant investigations are as below   Pulmonary Relevant Hx  Smoking:  reports that he quit smoking about 4 years ago. His smoking use included Cigarettes. He has a 35 pack-year smoking history. He has never used smokeless tobacco.  CT cest Jan 2013 reviewed image: 71mm LUL inferior lobe nodule. Pleural thickening at base. Not hadd FU CT  WEight Body mass index is 32.94 kg/(m^2).  CKD STage 3, creat 1.36 Nov 2014  October 2014 due to decompensated congestive heart failure not otherwise specified with associated acute respiratory failure. Also not otherwise specified. Followed with Dr. [name] 07/09/2013 and found to have been improved  Relevant cardiac investigations include echocardiogram 03/25/2013 with normal LV  systolic ejection fraction 55%. Severe pulmonary hypertension. PA systolic pressure 61.  BNP at this time 12K  Myoview stress test 05/10/2013: Stress test was terminated due to completion of protocol. Left ventricle was mild to moderately dilated at both rest and stress. There was diaphragmatic attenuation but statistically no significant change you. Left ventricle ejection fraction was 40%. Low risk study it is the conclusion   CXR Nov 2014: CHF  V  ILD (pro-BNP 6K at this time)  08/30/2013  ov/Taryll Reichenberger re: preop consultation/ clearance for eye sutery  Chief Complaint  Patient presents with  . Follow-up    [name] patient- Needs pulm clearance for eye surgery. Pt states that he feels his breathing is doing well and denies any CP, wheezing or cough.     On ACEi but no cough, Rarely using neb, not using 02 any more at all , sleeping flat ok Not using nebs or saba at all  No obvious day to day or daytime variabilty or assoc chronic cough or cp or chest tightness, subjective wheeze overt sinus or hb symptoms. No unusual exp hx or h/o childhood pna/ asthma or knowledge of premature birth.  Sleeping ok without nocturnal  or early am exacerbation  of respiratory  c/o's or need for noct saba. Also denies any obvious fluctuation of symptoms with weather or environmental changes or other aggravating or alleviating factors except as outlined above   Current Medications, Allergies, Complete Past Medical History, Past Surgical History, Family History, and Social History were reviewed in [employer]  medical record.  ROS  The following are not active complaints unless bolded sore throat, dysphagia, dental problems, itching, sneezing,  nasal congestion or excess/ purulent secretions, ear ache,   fever, chills, sweats, unintended wt loss, pleuritic or exertional cp, hemoptysis,  orthopnea pnd or leg swelling, presyncope, palpitations, heartburn, abdominal pain, anorexia, nausea, vomiting, diarrhea   or change in bowel or urinary habits, change in stools or urine, dysuria,hematuria,  rash, arthralgias, visual complaints, headache, numbness weakness or ataxia or problems with walking or coordination,  change in mood/affect or memory.           Objective:   Physical Exam   Nursing note and vitals reviewed. Chronically ill / w/c bound  Wt Readings from Last 3 Encounters:  08/30/13 233 lb (105.688 kg)  08/12/13 226 lb (102.513 kg)  08/06/13 220 lb (99.791 kg)      HENT:  Head: Normocephalic and atraumatic.  Right Ear: External ear normal.  Left Ear: External ear normal.  Mouth/Throat: Oropharynx is clear and moist. No oropharyngeal exudate.  Eyes: Conjunctivae and EOM are normal. Pupils are equal, round, and reactive to light. Right eye exhibits no discharge. Left eye exhibits no discharge. No scleral icterus.  Neck: Normal range of motion. Neck supple. No JVD present. No tracheal deviation present. No thyromegaly present.  Cardiovascular: Normal rate, regular rhythm and intact distal pulses.  Exam reveals no gallop and no friction rub.   No murmur heard. Pulmonary/Chest: Effort normal. No respiratory distress. He has no wheezes.  Mild crackles bilateral bases  Abdominal: Soft. Bowel sounds are normal. He exhibits no distension and no mass. There is no tenderness. There is no rebound and no guarding.  Musculoskeletal: Normal range of motion. He exhibits no edema and no tenderness.  Lymphadenopathy:    He has no cervical adenopathy.  Neurological: He is alert and oriented to person, place, and time. He has normal reflexes. No cranial nerve deficit. Coordination normal.  Skin: Skin is warm and dry. No rash noted. He is not diaphoretic. No erythema. No pallor.  Psychiatric: He has a normal mood and affect. His behavior is normal. Judgment and thought content normal.     07/31/14 HRCT IMPRESSION:  1. The appearance of the lungs is compatible with an interstitial  lung disease.  Given the progressive changes compared to the prior  study and the possibility of early honeycombing, these findings may  indicate early usual interstitial pneumonia (UIP), however, this is  not yet definitive UIP. This may alternatively represent nonspecific  interstitial pneumonia (NSIP). Correlation with open lung biopsy may  provide additional diagnostic information if clinically indicated.  Alternatively, these findings could be followed with repeat  high-resolution chest CT in 6-12 months to assess for temporal  progression of disease.  2. Interval development of small bilateral pleural effusions (right  greater than left).  3. Mediastinal lymphadenopathy is nonspecific, and may be simply  related to underlying interstitial lung disease. Attention on  followup studies is recommended.  4. Atherosclerosis, including left main and 3 vessel coronary artery  disease. In addition, the patient has mild aneurysmal dilatation of  the ascending thoracic aorta, which is unchanged.  5. Previously noted small nodule in the inferior segment of the  lingula has resolved compared to the prior study, likely an area of  mucoid impaction on the prior examination. Multiple tiny 2-3 mm  nodules are now noted throughout the lungs bilaterally, which are  highly nonspecific.  6. Additional incidental findings, as above  Assessment & Plan:

## 2013-08-31 NOTE — Assessment & Plan Note (Signed)
07/31/13 HRCT c/w  IPF 08/30/2013   Walked RA x one lap @ 185 stopped due to  Legs gave out, sats still 96%  Probably multifactorial but certainly not a contraindication to eye surgery but needs to regroup with Dr Chase Caller afterwards to look at the longterm options   Cleared for eye surgery in meantime

## 2013-08-31 NOTE — Assessment & Plan Note (Signed)
Note on ACEi with no active cough or concern at present > ok to continue but be aware that acei effects overlap with many of the non-specific resp complaints he is prone to develop over time so low threshold to change to arb

## 2013-09-03 ENCOUNTER — Encounter (HOSPITAL_COMMUNITY)
Admission: RE | Admit: 2013-09-03 | Discharge: 2013-09-03 | Disposition: A | Discharge status: Home / Self Care | Payer: Medicare Other | Source: Ambulatory Visit | Attending: Ophthalmology | Admitting: Ophthalmology

## 2013-09-03 ENCOUNTER — Encounter (HOSPITAL_COMMUNITY): Payer: Self-pay

## 2013-09-03 LAB — BASIC METABOLIC PANEL
BUN: 24 mg/dL — ABNORMAL HIGH (ref 6–23)
CO2: 22 mEq/L (ref 19–32)
Calcium: 8.9 mg/dL (ref 8.4–10.5)
Chloride: 97 mEq/L (ref 96–112)
Creatinine, Ser: 1.7 mg/dL — ABNORMAL HIGH (ref 0.50–1.35)
GFR, EST AFRICAN AMERICAN: 42 mL/min — AB (ref >90)
GFR, EST NON AFRICAN AMERICAN: 36 mL/min — AB (ref >90)
Glucose, Bld: 106 mg/dL — ABNORMAL HIGH (ref 70–99)
POTASSIUM: 3.9 meq/L (ref 3.7–5.3)
Sodium: 135 mEq/L — ABNORMAL LOW (ref 137–147)

## 2013-09-03 LAB — CBC
HEMATOCRIT: 34 % — AB (ref 39.0–52.0)
HEMOGLOBIN: 11.5 g/dL — AB (ref 13.0–17.0)
MCH: 32.2 pg (ref 26.0–34.0)
MCHC: 33.8 g/dL (ref 30.0–36.0)
MCV: 95.2 fL (ref 78.0–100.0)
Platelets: 297 10*3/uL (ref 150–400)
RBC: 3.57 MIL/uL — ABNORMAL LOW (ref 4.22–5.81)
RDW: 13.8 % (ref 11.5–15.5)
WBC: 6.9 10*3/uL (ref 4.0–10.5)

## 2013-09-03 MED ORDER — PREDNISOLONE ACETATE 1 % OP SUSP
1.0000 [drp] | OPHTHALMIC | Status: AC
Start: 2013-09-04 — End: 2013-09-04
  Administered 2013-09-04: 1 [drp] via OPHTHALMIC
  Filled 2013-09-03: qty 5

## 2013-09-03 MED ORDER — CYCLOPENTOLATE HCL 1 % OP SOLN
1.0000 [drp] | OPHTHALMIC | Status: AC | PRN
  Administered 2013-09-04 (×3): 1 [drp] via OPHTHALMIC
  Filled 2013-09-03: qty 2

## 2013-09-03 MED ORDER — TETRACAINE HCL 0.5 % OP SOLN
2.0000 [drp] | OPHTHALMIC | Status: AC
  Administered 2013-09-04: 2 [drp] via OPHTHALMIC
  Filled 2013-09-03: qty 2

## 2013-09-03 MED ORDER — GATIFLOXACIN 0.5 % OP SOLN
1.0000 [drp] | OPHTHALMIC | Status: AC | PRN
  Administered 2013-09-04 (×3): 1 [drp] via OPHTHALMIC
  Filled 2013-09-03: qty 2.5

## 2013-09-03 MED ORDER — PHENYLEPHRINE HCL 2.5 % OP SOLN
1.0000 [drp] | OPHTHALMIC | Status: AC | PRN
  Administered 2013-09-04 (×3): 1 [drp] via OPHTHALMIC

## 2013-09-03 NOTE — Progress Notes (Signed)
Anesthesia follow-up:  See my note from 08/12/13.  Surgery was postponed until patient could get pulmonary clearance.  Patient was seen by Dr. Christinia Gully on 08/30/13 who felt there was no pulmonary contraindication to eye surgery, but recommended post-operative follow-up with his primary pulmonologist [name] to discuss long-term options for dyspnea/IPF. Recent PFTs on 08/27/13 in Epic noted.  Today's labs noted.  He has now been cleared by pulmonary and cardiology.  If no acute changes then plan to proceed.  [name] [name] [facility] Short Stay Center/Anesthesiology Phone [phone] 09/03/2013 4:30 PM

## 2013-09-03 NOTE — Progress Notes (Signed)
Patient appeared to be short of breath upon arriving to PAT room and when questioned about it he stated "yeah I'm a little winded because I had to walk a little bit," but after interview PAT did not appear to be short winded and he denied having any shortness of breath. Arnita (aide) was at chair side during PAT visit, but stated Andee Poles would be with patient tomorrow for surgery.

## 2013-09-03 NOTE — Pre-Procedure Instructions (Signed)
[name] [name]  09/03/2013   Your procedure is scheduled on:  Wednesday September 04, 2013 at 8:30 AM.  Report to [facility] Short Stay Entrance "A"  Admitting at 6:30 AM.  Call this number if you have problems the morning of surgery: [phone]   Remember:   Do not eat food or drink liquids after midnight.   Take these medicines the morning of surgery with A SIP OF WATER: Albuterol nebulizer, Amlodipine (Norvasc), and Metoprolol (Toprol XL).    Do NOT take any diabetic medications the morning of your surgery   Do not wear jewelry.  Do not wear lotions, powders, or colognes. You may wear deodorant.  Men may shave face and neck.  Do not bring valuables to the hospital.  [facility] is not responsible for any belongings or valuables.               Contacts, dentures or bridgework may not be worn into surgery.  Leave suitcase in the car. After surgery it may be brought to your room.  For patients admitted to the hospital, discharge time is determined by your treatment team.               Patients discharged the day of surgery will not be allowed to drive home.  Name and phone number of your driver: Family/Friend  Special Instructions: Shower tonight and tomorrow morning using CHG soap.   Please read over the following fact sheets that you were given: Pain Booklet, Coughing and Deep Breathing and Surgical Site Infection Prevention

## 2013-09-03 NOTE — Progress Notes (Signed)
09/03/13 1431  OBSTRUCTIVE SLEEP APNEA  Have you ever been diagnosed with sleep apnea through a sleep study? No  Do you snore loudly (loud enough to be heard through closed doors)?  0  Do you often feel tired, fatigued, or sleepy during the daytime? 1  Has anyone observed you stop breathing during your sleep? 0  Do you have, or are you being treated for high blood pressure? 1  BMI more than 35 kg/m2? 1  Age over 78 years old? 1  Neck circumference greater than 40 cm/18 inches? 0  Gender: 1  Obstructive Sleep Apnea Score 5  Score 4 or greater  Results sent to PCP   This patient has screened at risk for sleep apnea using the STOP Bang tool used during a pre-surgical visit. A score of 4 or greater is at risk for sleep apnea.

## 2013-09-04 ENCOUNTER — Ambulatory Visit (HOSPITAL_COMMUNITY)
Admission: RE | Admit: 2013-09-04 | Discharge: 2013-09-04 | Disposition: A | Discharge status: Home / Self Care | Payer: Medicare Other | Source: Ambulatory Visit | Attending: Ophthalmology | Admitting: Ophthalmology

## 2013-09-04 ENCOUNTER — Ambulatory Visit (HOSPITAL_COMMUNITY): Payer: Medicare Other | Admitting: Anesthesiology

## 2013-09-04 ENCOUNTER — Encounter (HOSPITAL_COMMUNITY): Payer: Self-pay | Admitting: *Deleted

## 2013-09-04 ENCOUNTER — Encounter (HOSPITAL_COMMUNITY)
Admission: RE | Disposition: A | Discharge status: Home / Self Care | Payer: Self-pay | Source: Ambulatory Visit | Attending: Ophthalmology

## 2013-09-04 ENCOUNTER — Encounter (HOSPITAL_COMMUNITY): Payer: Medicare Other | Admitting: Vascular Surgery

## 2013-09-04 DIAGNOSIS — E119 Type 2 diabetes mellitus without complications: Secondary | ICD-10-CM | POA: Insufficient documentation

## 2013-09-04 DIAGNOSIS — J4489 Other specified chronic obstructive pulmonary disease: Secondary | ICD-10-CM | POA: Insufficient documentation

## 2013-09-04 DIAGNOSIS — H2510 Age-related nuclear cataract, unspecified eye: Secondary | ICD-10-CM | POA: Insufficient documentation

## 2013-09-04 DIAGNOSIS — H4011X0 Primary open-angle glaucoma, stage unspecified: Secondary | ICD-10-CM | POA: Diagnosis not present

## 2013-09-04 DIAGNOSIS — H25019 Cortical age-related cataract, unspecified eye: Secondary | ICD-10-CM | POA: Insufficient documentation

## 2013-09-04 DIAGNOSIS — H409 Unspecified glaucoma: Secondary | ICD-10-CM | POA: Diagnosis not present

## 2013-09-04 DIAGNOSIS — I10 Essential (primary) hypertension: Secondary | ICD-10-CM | POA: Insufficient documentation

## 2013-09-04 DIAGNOSIS — Z87891 Personal history of nicotine dependence: Secondary | ICD-10-CM | POA: Insufficient documentation

## 2013-09-04 DIAGNOSIS — J449 Chronic obstructive pulmonary disease, unspecified: Secondary | ICD-10-CM | POA: Insufficient documentation

## 2013-09-04 DIAGNOSIS — Z01812 Encounter for preprocedural laboratory examination: Secondary | ICD-10-CM | POA: Insufficient documentation

## 2013-09-04 HISTORY — PX: CATARACT EXTRACTION W/PHACO: SHX586

## 2013-09-04 LAB — GLUCOSE, CAPILLARY
GLUCOSE-CAPILLARY: 136 mg/dL — AB (ref 70–99)
GLUCOSE-CAPILLARY: 129 mg/dL — AB (ref 70–99)

## 2013-09-04 SURGERY — PHACOEMULSIFICATION, CATARACT, WITH IOL INSERTION
Anesthesia: Monitor Anesthesia Care | Site: Eye | Laterality: Left

## 2013-09-04 MED ORDER — FENTANYL CITRATE 0.05 MG/ML IJ SOLN
INTRAMUSCULAR | Status: AC
  Filled 2013-09-04: qty 5

## 2013-09-04 MED ORDER — ONDANSETRON HCL 4 MG/2ML IJ SOLN
INTRAMUSCULAR | Status: AC
  Filled 2013-09-04: qty 2

## 2013-09-04 MED ORDER — ONDANSETRON HCL 4 MG/2ML IJ SOLN: 4.0000 mg | Freq: Once | INTRAMUSCULAR | Status: DC | PRN

## 2013-09-04 MED ORDER — PHENYLEPHRINE HCL 10 MG/ML IJ SOLN
INTRAMUSCULAR | Status: AC
  Filled 2013-09-04: qty 1

## 2013-09-04 MED ORDER — HYPROMELLOSE (GONIOSCOPIC) 2.5 % OP SOLN
OPHTHALMIC | Status: DC | PRN
  Administered 2013-09-04: 2 [drp] via OPHTHALMIC

## 2013-09-04 MED ORDER — PROPOFOL 10 MG/ML IV BOLUS
INTRAVENOUS | Status: AC
  Filled 2013-09-04: qty 20

## 2013-09-04 MED ORDER — EPHEDRINE SULFATE 50 MG/ML IJ SOLN
INTRAMUSCULAR | Status: AC
  Filled 2013-09-04: qty 1

## 2013-09-04 MED ORDER — BUPIVACAINE HCL (PF) 0.75 % IJ SOLN
INTRAMUSCULAR | Status: AC
  Filled 2013-09-04: qty 10

## 2013-09-04 MED ORDER — BSS IO SOLN
INTRAOCULAR | Status: AC
  Filled 2013-09-04: qty 500

## 2013-09-04 MED ORDER — ALBUTEROL SULFATE HFA 108 (90 BASE) MCG/ACT IN AERS
INHALATION_SPRAY | RESPIRATORY_TRACT | Status: AC
  Filled 2013-09-04: qty 6.7

## 2013-09-04 MED ORDER — SODIUM CHLORIDE 0.9 % IV SOLN
INTRAVENOUS | Status: DC | PRN
  Administered 2013-09-04 (×2): via INTRAVENOUS

## 2013-09-04 MED ORDER — TETRACAINE HCL 0.5 % OP SOLN
OPHTHALMIC | Status: AC
  Filled 2013-09-04: qty 2

## 2013-09-04 MED ORDER — SUCCINYLCHOLINE CHLORIDE 20 MG/ML IJ SOLN
INTRAMUSCULAR | Status: DC | PRN
  Administered 2013-09-04: 100 mg via INTRAVENOUS

## 2013-09-04 MED ORDER — TRIAMCINOLONE ACETONIDE 40 MG/ML IJ SUSP
INTRAMUSCULAR | Status: AC
  Filled 2013-09-04: qty 5

## 2013-09-04 MED ORDER — SODIUM HYALURONATE 10 MG/ML IO SOLN
INTRAOCULAR | Status: AC
  Filled 2013-09-04: qty 0.85

## 2013-09-04 MED ORDER — ROCURONIUM BROMIDE 50 MG/5ML IV SOLN
INTRAVENOUS | Status: AC
  Filled 2013-09-04: qty 1

## 2013-09-04 MED ORDER — ATROPINE SULFATE 0.1 MG/ML IJ SOLN
INTRAMUSCULAR | Status: AC
  Filled 2013-09-04: qty 10

## 2013-09-04 MED ORDER — SODIUM CHLORIDE 0.9 % IJ SOLN
INTRAMUSCULAR | Status: AC
  Filled 2013-09-04: qty 10

## 2013-09-04 MED ORDER — LIDOCAINE HCL 2 % IJ SOLN
INTRAMUSCULAR | Status: AC
  Filled 2013-09-04: qty 20

## 2013-09-04 MED ORDER — BACITRACIN-POLYMYXIN B 500-10000 UNIT/GM OP OINT
TOPICAL_OINTMENT | OPHTHALMIC | Status: DC | PRN
  Administered 2013-09-04: 1 via OPHTHALMIC

## 2013-09-04 MED ORDER — EPHEDRINE SULFATE 50 MG/ML IJ SOLN
INTRAMUSCULAR | Status: DC | PRN
  Administered 2013-09-04: 15 mg via INTRAVENOUS

## 2013-09-04 MED ORDER — DEXAMETHASONE SODIUM PHOSPHATE 4 MG/ML IJ SOLN
INTRAMUSCULAR | Status: AC
  Filled 2013-09-04: qty 2

## 2013-09-04 MED ORDER — MIDAZOLAM HCL 2 MG/2ML IJ SOLN
INTRAMUSCULAR | Status: AC
  Filled 2013-09-04: qty 2

## 2013-09-04 MED ORDER — ARTIFICIAL TEARS OP OINT
TOPICAL_OINTMENT | OPHTHALMIC | Status: AC
  Filled 2013-09-04: qty 3.5

## 2013-09-04 MED ORDER — HYPROMELLOSE (GONIOSCOPIC) 2.5 % OP SOLN
OPHTHALMIC | Status: AC
  Filled 2013-09-04: qty 15

## 2013-09-04 MED ORDER — FENTANYL CITRATE 0.05 MG/ML IJ SOLN
25.0000 ug | INTRAMUSCULAR | Status: DC | PRN
Start: 2013-09-04 — End: 2013-09-04

## 2013-09-04 MED ORDER — ACETAZOLAMIDE SODIUM 500 MG IJ SOLR
INTRAMUSCULAR | Status: AC
  Filled 2013-09-04: qty 500

## 2013-09-04 MED ORDER — DEXAMETHASONE SODIUM PHOSPHATE 4 MG/ML IJ SOLN
INTRAMUSCULAR | Status: DC | PRN
  Administered 2013-09-04: 8 mg via INTRAVENOUS

## 2013-09-04 MED ORDER — LIDOCAINE HCL (CARDIAC) 20 MG/ML IV SOLN
INTRAVENOUS | Status: AC
  Filled 2013-09-04: qty 5

## 2013-09-04 MED ORDER — SUCCINYLCHOLINE CHLORIDE 20 MG/ML IJ SOLN
INTRAMUSCULAR | Status: AC
  Filled 2013-09-04: qty 1

## 2013-09-04 MED ORDER — NA CHONDROIT SULF-NA HYALURON 40-30 MG/ML IO SOLN
INTRAOCULAR | Status: AC
  Filled 2013-09-04: qty 0.5

## 2013-09-04 MED ORDER — CEFAZOLIN SUBCONJUNCTIVAL INJECTION 100 MG/0.5 ML
INJECTION | SUBCONJUNCTIVAL | Status: DC | PRN
  Administered 2013-09-04: 100 mg via SUBCONJUNCTIVAL

## 2013-09-04 MED ORDER — STERILE WATER FOR IRRIGATION IR SOLN
Status: DC | PRN
  Administered 2013-09-04: 200 mL

## 2013-09-04 MED ORDER — BACITRACIN-POLYMYXIN B 500-10000 UNIT/GM OP OINT
TOPICAL_OINTMENT | OPHTHALMIC | Status: AC
  Filled 2013-09-04: qty 3.5

## 2013-09-04 MED ORDER — PROVISC 10 MG/ML IO SOLN
INTRAOCULAR | Status: DC | PRN
  Administered 2013-09-04: 0.85 mL via INTRAOCULAR

## 2013-09-04 MED ORDER — ARTIFICIAL TEARS OP OINT
TOPICAL_OINTMENT | OPHTHALMIC | Status: DC | PRN
  Administered 2013-09-04: 1 via OPHTHALMIC

## 2013-09-04 MED ORDER — LIDOCAINE HCL 2 % IJ SOLN
INTRAMUSCULAR | Status: DC | PRN
  Administered 2013-09-04: 08:00:00 via RETROBULBAR

## 2013-09-04 MED ORDER — PROPOFOL 10 MG/ML IV BOLUS
INTRAVENOUS | Status: DC | PRN
  Administered 2013-09-04: 20 mg via INTRAVENOUS
  Administered 2013-09-04: 100 mg via INTRAVENOUS
  Administered 2013-09-04: 60 mg via INTRAVENOUS
  Administered 2013-09-04: 20 mg via INTRAVENOUS

## 2013-09-04 MED ORDER — NA CHONDROIT SULF-NA HYALURON 40-30 MG/ML IO SOLN
INTRAOCULAR | Status: DC | PRN
  Administered 2013-09-04: 0.5 mL via INTRAOCULAR

## 2013-09-04 MED ORDER — ALBUTEROL SULFATE HFA 108 (90 BASE) MCG/ACT IN AERS
INHALATION_SPRAY | RESPIRATORY_TRACT | Status: DC | PRN
  Administered 2013-09-04: 6 via RESPIRATORY_TRACT
  Administered 2013-09-04: 10 via RESPIRATORY_TRACT

## 2013-09-04 MED ORDER — CEFAZOLIN SUBCONJUNCTIVAL INJECTION 100 MG/0.5 ML
100.0000 mg | INJECTION | SUBCONJUNCTIVAL | Status: DC
  Filled 2013-09-04: qty 0.5

## 2013-09-04 MED ORDER — DEXAMETHASONE SODIUM PHOSPHATE 10 MG/ML IJ SOLN
INTRAMUSCULAR | Status: DC | PRN
  Administered 2013-09-04: 10 mg

## 2013-09-04 MED ORDER — EPINEPHRINE HCL 1 MG/ML IJ SOLN
INTRAOCULAR | Status: DC | PRN
  Administered 2013-09-04: 08:00:00

## 2013-09-04 MED ORDER — PHENYLEPHRINE HCL 10 MG/ML IJ SOLN
INTRAMUSCULAR | Status: DC | PRN
  Administered 2013-09-04: 80 ug via INTRAVENOUS
  Administered 2013-09-04 (×2): 120 ug via INTRAVENOUS

## 2013-09-04 MED ORDER — EPINEPHRINE HCL 1 MG/ML IJ SOLN
INTRAMUSCULAR | Status: AC
  Filled 2013-09-04: qty 1

## 2013-09-04 MED ORDER — FENTANYL CITRATE 0.05 MG/ML IJ SOLN
INTRAMUSCULAR | Status: DC | PRN
  Administered 2013-09-04 (×2): 50 ug via INTRAVENOUS

## 2013-09-04 MED ORDER — ONDANSETRON HCL 4 MG/2ML IJ SOLN
INTRAMUSCULAR | Status: DC | PRN
  Administered 2013-09-04: 4 mg via INTRAVENOUS

## 2013-09-04 MED ORDER — DEXAMETHASONE SODIUM PHOSPHATE 10 MG/ML IJ SOLN
INTRAMUSCULAR | Status: AC
  Filled 2013-09-04: qty 1

## 2013-09-04 MED ORDER — 0.9 % SODIUM CHLORIDE (POUR BTL) OPTIME
TOPICAL | Status: DC | PRN
  Administered 2013-09-04: 200 mL

## 2013-09-04 SURGICAL SUPPLY — 53 items
APL SRG 3 HI ABS STRL LF PLS (MISCELLANEOUS) ×2
APPLICATOR COTTON TIP 6IN STRL (MISCELLANEOUS) ×3 IMPLANT
APPLICATOR DR MATTHEWS STRL (MISCELLANEOUS) ×5 IMPLANT
BLADE KERATOME 2.75 (BLADE) ×2 IMPLANT
BLADE KERATOME 2.75MM (BLADE) ×1
BLADE STAB KNIFE 15DEG (BLADE) IMPLANT
COVER MAYO STAND STRL (DRAPES) ×3 IMPLANT
DRAPE OPHTHALMIC 77X100 STRL (CUSTOM PROCEDURE TRAY) ×5 IMPLANT
DRAPE POUCH INSTRU U-SHP 10X18 (DRAPES) ×3 IMPLANT
DRSG TEGADERM 4X4.75 (GAUZE/BANDAGES/DRESSINGS) ×3 IMPLANT
FILTER BLUE MILLIPORE (MISCELLANEOUS) IMPLANT
GLOVE SS BIOGEL STRL SZ 6.5 (GLOVE) ×1 IMPLANT
GLOVE SUPERSENSE BIOGEL SZ 6.5 (GLOVE) ×2
GOWN SRG XL XLNG 56XLVL 4 (GOWN DISPOSABLE) ×1 IMPLANT
GOWN STRL NON-REIN LRG LVL3 (GOWN DISPOSABLE) ×3 IMPLANT
GOWN STRL NON-REIN XL XLG LVL4 (GOWN DISPOSABLE) ×3
KIT BASIN OR (CUSTOM PROCEDURE TRAY) ×3 IMPLANT
KIT ROOM TURNOVER OR (KITS) ×3 IMPLANT
KNIFE GRIESHABER SHARP 2.5MM (MISCELLANEOUS) ×3 IMPLANT
LENS IOL ACRYSOF MP POST 18.5 (Intraocular Lens) ×2 IMPLANT
MASK EYE SHIELD (GAUZE/BANDAGES/DRESSINGS) ×2 IMPLANT
NDL 25GX 5/8IN NON SAFETY (NEEDLE) ×2 IMPLANT
NDL FILTER BLUNT 18X1 1/2 (NEEDLE) IMPLANT
NDL HYPO 30X.5 LL (NEEDLE) ×2 IMPLANT
NEEDLE 22X1 1/2 (OR ONLY) (NEEDLE) IMPLANT
NEEDLE 25GX 5/8IN NON SAFETY (NEEDLE) ×6 IMPLANT
NEEDLE FILTER BLUNT 18X 1/2SAF (NEEDLE) ×2
NEEDLE FILTER BLUNT 18X1 1/2 (NEEDLE) ×1 IMPLANT
NEEDLE HYPO 30X.5 LL (NEEDLE) ×6 IMPLANT
NS IRRIG 1000ML POUR BTL (IV SOLUTION) ×3 IMPLANT
PACK CATARACT CUSTOM (CUSTOM PROCEDURE TRAY) ×3 IMPLANT
PAD ARMBOARD 7.5X6 YLW CONV (MISCELLANEOUS) ×3 IMPLANT
PAD EYE OVAL STERILE LF (GAUZE/BANDAGES/DRESSINGS) IMPLANT
PAK PIK CVS CATARACT (OPHTHALMIC) ×3 IMPLANT
PROBE ANTERIOR 20G W/INFUS NDL (MISCELLANEOUS) IMPLANT
SHUTTLE MONARCH TYPE A (NEEDLE) ×3 IMPLANT
SPEAR EYE SURG WECK-CEL (MISCELLANEOUS) IMPLANT
SUT ETHILON 10-0 CS-B-6CS-B-6 (SUTURE)
SUT ETHILON 5 0 P 3 18 (SUTURE)
SUT ETHILON 9 0 TG140 8 (SUTURE) IMPLANT
SUT NYLON ETHILON 5-0 P-3 1X18 (SUTURE) IMPLANT
SUT PLAIN 6 0 TG1408 (SUTURE) IMPLANT
SUT POLY NON ABSORB 10-0 8 STR (SUTURE) IMPLANT
SUT VICRYL 6 0 S 29 12 (SUTURE) IMPLANT
SUTURE EHLN 10-0 CS-B-6CS-B-6 (SUTURE) IMPLANT
SYR 20CC LL (SYRINGE) ×2 IMPLANT
SYR TB 1ML LUER SLIP (SYRINGE) IMPLANT
SYRINGE 10CC LL (SYRINGE) IMPLANT
TIP ABS 45DEG FLARED 0.9MM (TIP) ×3 IMPLANT
TOWEL OR 17X24 6PK STRL BLUE (TOWEL DISPOSABLE) ×6 IMPLANT
WATER STERILE IRR 1000ML POUR (IV SOLUTION) ×3 IMPLANT
WIPE INSTRUMENT ADHESIVE BACK (MISCELLANEOUS) ×3 IMPLANT
WIPE INSTRUMENT VISIWIPE 73X73 (MISCELLANEOUS) ×3 IMPLANT

## 2013-09-04 NOTE — Transfer of Care (Signed)
Immediate Anesthesia Transfer of Care Note  Patient: [name] [name]  Procedure(s) Performed: Procedure(s): CATARACT EXTRACTION PHACO AND INTRAOCULAR LENS PLACEMENT (IOC) (Left)  Patient Location: PACU  Anesthesia Type:General  Level of Consciousness: awake, alert , oriented and patient cooperative  Airway & Oxygen Therapy: Patient Spontanous Breathing and Patient connected to face mask oxygen  Post-op Assessment: Report given to PACU RN, Post -op Vital signs reviewed and stable and Patient moving all extremities X 4  Post vital signs: Reviewed and stable  Complications: No apparent anesthesia complications

## 2013-09-04 NOTE — Op Note (Signed)
[name] [name] 09/04/2013 Cataract: Combined, Nuclear  Procedure: Phacoemulsification, Posterior Chamber Intra-ocular Lens Operative Eye:  left eye  Surgeon: Adonis Brook Estimated Blood Loss: minimal Specimens for Pathology:  None Complications: none  The patient was prepared and draped in the usual manner for ocular surgery on the left eye. A Cook lid speculum was placed. A peripheral clear corneal incision was made at the surgical limbus centered at the 11:00 meridian. A separate clear corneal stab incision was made with a 15 degree blade at the 2:00 meridian to permit bi-manual technique. Viscoat and  Provisc as an underlying layer next to the capsule was instilled into the anterior chamber through that incision.  A keratome was used to create a self sealing incision entering the anterior chamber at the 11:00 meridian. A capsulorhexis was performed using a bent 25g needle. The lens was hydrodissected and the nucleus was hydrodilineated using a Nichammin cannula. The Chang chopper was inserted and used to rotate the lens to insure adequate lens mobility. The phacoemulsification handpiece was inserted and a combined phaco-chop technique was employed, fracturing the lens into separate sections with subsequent removal with the phaco handpiece.   The I/A cannula was used to remove remaining lens cortex. Provisc was instilled and used to deepen the anterior chamber and posterior capsule bag. The Monarch injector was used to place a folded Acrysof [id number] PC IOL, + 18.50  diopters, into the capsule bag. A Sinskey lens hook was used to dial in the trailing haptic.  The I/A cannula was used to remove the viscoelastic from the anterior chamber. BSS was used to bring IOP to the desired range and the wound was checked to insure it was watertight. Subconjunctival injections of Ancef 100/0.75ml and Dexamethasone 0.5 ml of a 10mg /33ml solution were placed without complication. The lid speculum and drapes were  removed and the patient's eye was patched with Polymixin/Bacitracin ophthalmic ointment. An eye shield was placed and the patient was transferred alert and conversant from the operating room to the post-operative recovery area.   Adonis Brook, MD

## 2013-09-04 NOTE — Interval H&P Note (Signed)
History and Physical Interval Note:  09/04/2013 8:27 AM  BP 168/67  Pulse 77  Temp(Src) 97.6 F (36.4 C) (Oral)  Resp 22  SpO2 99%   [name] [name]  has presented today for surgery, with the diagnosis of Combined Cataract Left Eye  The various methods of treatment have been discussed with the patient and family. After consideration of risks, benefits and other options for treatment, the patient has consented to  Procedure(s): CATARACT EXTRACTION PHACO AND INTRAOCULAR LENS PLACEMENT ([place]) (Left) as a surgical intervention .  The patient's history has been reviewed, patient examined, no change in status, stable for surgery.  I have reviewed the patient's chart and labs.  Questions were answered to the patient's satisfaction.     Brandonlee Navis, Lavella Hammock

## 2013-09-04 NOTE — Discharge Instructions (Signed)
Teigan Sahli      09/04/2013  Post-operative instructions for Saman Giddens L. Anderson Malta, MD  Caring for your eye:  Do not rub your eye and wash your hands before touching the eye area. This is important to avoid injury and infection.  You may use sterile gauze pads and sterile eye wash to cleanse the lid margins of mucous accumulation.  DO NOT REUSE GAUZE after wiping the eye. Use a new clean one if needed.  Be certain not to touch the top of the medication bottle to the eyelids to avoid contaminating your medicine bottle and causing infection.  After eye surgery the surface of the eye and eyelids may be puffy. You may note a red blotch(s) on the surface of the eye or a bruise on your eyelid. These are usually related to injections or instruments used in surgery and are not cause for alarm. You may also notice blood tinged tears on your eye pad, this is common and not cause for alarm. These findings will subside over the coming week or two.  Activity:  No jarring activities. Walking with assistance early on as needed is advised. Avoid straining and let me know if you have significant constipation. Do not bend over at the waist with the head below your waist to minimize risk of bleeding inside the eye.  Avoid getting water from washing your hair or showering  in your eye. Patch the eye if necessary during bathing to avoid contamination.    You may: watch television, work on you computer, read books, eat out, ride in a car.  Sleeping Position:  You may sleep in your   customary manner.  Wear your eye shield for naps and sleeping at night for the first two weeks.   Eye Medication:  Zymaxid 1 gtt in operative eye four times daily.                               Prednisolobne Acetate 1% 1 gtt in operative  Eye four times daily  Wait a few minutes between your eye drops when placing them.   Resume your customary medications on your normal schedule.   Adonis Brook MD    After office hours I  can be reached by calling:    [phone]

## 2013-09-04 NOTE — Anesthesia Postprocedure Evaluation (Signed)
  Anesthesia Post-op Note  Patient: [name] [name]  Procedure(s) Performed: Procedure(s): CATARACT EXTRACTION PHACO AND INTRAOCULAR LENS PLACEMENT ([facility]) (Left)  Patient Location: PACU  Anesthesia Type:General  Level of Consciousness: awake, alert  and oriented  Airway and Oxygen Therapy: Patient Spontanous Breathing  Post-op Pain: none  Post-op Assessment: Post-op Vital signs reviewed  Post-op Vital Signs: Reviewed  Complications: No apparent anesthesia complications

## 2013-09-04 NOTE — Anesthesia Preprocedure Evaluation (Addendum)
Anesthesia Evaluation  Patient identified by MRN, date of birth, ID band Patient awake    Reviewed: Allergy & Precautions, H&P , NPO status , Patient's Chart, lab work & pertinent test results  Airway Mallampati: I TM Distance: >3 FB Neck ROM: Full    Dental  (+) Dental Advisory Given, Missing and Poor Dentition   Pulmonary COPD COPD inhaler, former smoker,  breath sounds clear to auscultation  + wheezing      Cardiovascular hypertension, Pt. on medications and Pt. on home beta blockers Rhythm:Regular Rate:Normal     Neuro/Psych    GI/Hepatic   Endo/Other  diabetes, Well Controlled, Type 2, Oral Hypoglycemic AgentsMorbid obesity  Renal/GU Renal disease     Musculoskeletal   Abdominal   Peds  Hematology   Anesthesia Other Findings   Reproductive/Obstetrics                          Anesthesia Physical Anesthesia Plan  ASA: III  Anesthesia Plan: MAC   Post-op Pain Management:    Induction: Intravenous  Airway Management Planned: Simple Face Mask and Nasal Cannula  Additional Equipment:   Intra-op Plan:   Post-operative Plan:   Informed Consent: I have reviewed the patients History and Physical, chart, labs and discussed the procedure including the risks, benefits and alternatives for the proposed anesthesia with the patient or authorized representative who has indicated his/her understanding and acceptance.   Dental advisory given  Plan Discussed with: Anesthesiologist, CRNA and Surgeon  Anesthesia Plan Comments:         Anesthesia Quick Evaluation

## 2013-09-04 NOTE — Preoperative (Signed)
Beta Blockers   Reason not to administer Beta Blockers:Not Applicable  Pt took am metoprolol 0500.

## 2013-09-06 ENCOUNTER — Encounter (HOSPITAL_COMMUNITY): Payer: Self-pay | Admitting: Ophthalmology

## 2013-09-10 ENCOUNTER — Other Ambulatory Visit: Payer: Self-pay | Admitting: Nephrology

## 2013-09-10 ENCOUNTER — Ambulatory Visit
Admission: RE | Admit: 2013-09-10 | Discharge: 2013-09-10 | Disposition: A | Discharge status: Home / Self Care | Payer: PRIVATE HEALTH INSURANCE | Source: Ambulatory Visit | Attending: Nephrology | Admitting: Nephrology

## 2013-09-10 DIAGNOSIS — E1129 Type 2 diabetes mellitus with other diabetic kidney complication: Secondary | ICD-10-CM

## 2013-09-10 DIAGNOSIS — D472 Monoclonal gammopathy: Secondary | ICD-10-CM

## 2013-09-10 DIAGNOSIS — D649 Anemia, unspecified: Secondary | ICD-10-CM

## 2013-09-10 DIAGNOSIS — N189 Chronic kidney disease, unspecified: Secondary | ICD-10-CM

## 2013-09-19 ENCOUNTER — Encounter (HOSPITAL_COMMUNITY): Payer: Self-pay | Admitting: Emergency Medicine

## 2013-09-19 ENCOUNTER — Emergency Department (HOSPITAL_COMMUNITY)
Admission: EM | Admit: 2013-09-19 | Discharge: 2013-09-20 | Disposition: A | Discharge status: Home / Self Care | Payer: Medicare Other | Attending: Emergency Medicine | Admitting: Emergency Medicine

## 2013-09-19 ENCOUNTER — Emergency Department (HOSPITAL_COMMUNITY): Payer: Medicare Other

## 2013-09-19 DIAGNOSIS — I129 Hypertensive chronic kidney disease with stage 1 through stage 4 chronic kidney disease, or unspecified chronic kidney disease: Secondary | ICD-10-CM | POA: Insufficient documentation

## 2013-09-19 DIAGNOSIS — R059 Cough, unspecified: Secondary | ICD-10-CM | POA: Insufficient documentation

## 2013-09-19 DIAGNOSIS — R0989 Other specified symptoms and signs involving the circulatory and respiratory systems: Secondary | ICD-10-CM | POA: Insufficient documentation

## 2013-09-19 DIAGNOSIS — J441 Chronic obstructive pulmonary disease with (acute) exacerbation: Secondary | ICD-10-CM

## 2013-09-19 DIAGNOSIS — Z7982 Long term (current) use of aspirin: Secondary | ICD-10-CM | POA: Insufficient documentation

## 2013-09-19 DIAGNOSIS — I509 Heart failure, unspecified: Secondary | ICD-10-CM | POA: Insufficient documentation

## 2013-09-19 DIAGNOSIS — Z79899 Other long term (current) drug therapy: Secondary | ICD-10-CM | POA: Insufficient documentation

## 2013-09-19 DIAGNOSIS — E119 Type 2 diabetes mellitus without complications: Secondary | ICD-10-CM | POA: Insufficient documentation

## 2013-09-19 DIAGNOSIS — E785 Hyperlipidemia, unspecified: Secondary | ICD-10-CM | POA: Insufficient documentation

## 2013-09-19 DIAGNOSIS — Z87891 Personal history of nicotine dependence: Secondary | ICD-10-CM | POA: Insufficient documentation

## 2013-09-19 DIAGNOSIS — N189 Chronic kidney disease, unspecified: Secondary | ICD-10-CM | POA: Insufficient documentation

## 2013-09-19 DIAGNOSIS — R109 Unspecified abdominal pain: Secondary | ICD-10-CM | POA: Insufficient documentation

## 2013-09-19 DIAGNOSIS — R05 Cough: Secondary | ICD-10-CM | POA: Insufficient documentation

## 2013-09-19 LAB — URINALYSIS, ROUTINE W REFLEX MICROSCOPIC
Bilirubin Urine: NEGATIVE
Glucose, UA: NEGATIVE mg/dL
HGB URINE DIPSTICK: NEGATIVE
Ketones, ur: NEGATIVE mg/dL
LEUKOCYTES UA: NEGATIVE
Nitrite: NEGATIVE
PROTEIN: NEGATIVE mg/dL
Specific Gravity, Urine: 1.017 (ref 1.005–1.030)
UROBILINOGEN UA: 1 mg/dL (ref 0.0–1.0)
pH: 5 (ref 5.0–8.0)

## 2013-09-19 LAB — BASIC METABOLIC PANEL
BUN: 31 mg/dL — ABNORMAL HIGH (ref 6–23)
CO2: 21 mEq/L (ref 19–32)
CREATININE: 1.66 mg/dL — AB (ref 0.50–1.35)
Calcium: 9.1 mg/dL (ref 8.4–10.5)
Chloride: 97 mEq/L (ref 96–112)
GFR calc Af Amer: 43 mL/min — ABNORMAL LOW (ref >90)
GFR, EST NON AFRICAN AMERICAN: 37 mL/min — AB (ref >90)
Glucose, Bld: 153 mg/dL — ABNORMAL HIGH (ref 70–99)
Potassium: 3.9 mEq/L (ref 3.7–5.3)
SODIUM: 136 meq/L — AB (ref 137–147)

## 2013-09-19 LAB — CBC
HCT: 35.8 % — ABNORMAL LOW (ref 39.0–52.0)
Hemoglobin: 12.4 g/dL — ABNORMAL LOW (ref 13.0–17.0)
MCH: 32.9 pg (ref 26.0–34.0)
MCHC: 34.6 g/dL (ref 30.0–36.0)
MCV: 95 fL (ref 78.0–100.0)
PLATELETS: 295 10*3/uL (ref 150–400)
RBC: 3.77 MIL/uL — ABNORMAL LOW (ref 4.22–5.81)
RDW: 14.4 % (ref 11.5–15.5)
WBC: 8.7 10*3/uL (ref 4.0–10.5)

## 2013-09-19 LAB — PRO B NATRIURETIC PEPTIDE: PRO B NATRI PEPTIDE: 6854 pg/mL — AB (ref 0–450)

## 2013-09-19 MED ORDER — PREDNISONE 20 MG PO TABS
40.0000 mg | ORAL_TABLET | Freq: Once | ORAL | Status: AC
  Administered 2013-09-19: 40 mg via ORAL
  Filled 2013-09-19: qty 2

## 2013-09-19 MED ORDER — ALBUTEROL SULFATE (2.5 MG/3ML) 0.083% IN NEBU: 2.5000 mg | INHALATION_SOLUTION | Freq: Every day | RESPIRATORY_TRACT | Status: DC

## 2013-09-19 MED ORDER — PREDNISONE 10 MG PO TABS: 40.0000 mg | ORAL_TABLET | Freq: Every day | ORAL | Status: DC

## 2013-09-19 MED ORDER — FUROSEMIDE 10 MG/ML IJ SOLN: 40.0000 mg | Freq: Once | INTRAMUSCULAR | Status: DC

## 2013-09-19 MED ORDER — FUROSEMIDE 20 MG PO TABS
40.0000 mg | ORAL_TABLET | Freq: Once | ORAL | Status: AC
  Administered 2013-09-19: 40 mg via ORAL
  Filled 2013-09-19: qty 2

## 2013-09-19 NOTE — ED Notes (Signed)
Per ems-- pt reports sharp L flank pain that started last night. No hx kidney stones. Denies issues urination. Upon ems arrival pt wheezing esp with exertion. Pt has been out of albuterol neb for 1 year. ems administered 5mg  albuterol. cbg 156. o2 90% on RA prior to tx. bp-168/80 hr-82 r-22.

## 2013-09-19 NOTE — ED Notes (Signed)
E signature pad not working 

## 2013-09-19 NOTE — ED Provider Notes (Signed)
CSN: [id number]     Arrival date & time 09/19/13  2043 History   First MD Initiated Contact with Patient 09/19/13 2050     Chief Complaint  Patient presents with  . Flank Pain  . Wheezing   (Consider location/radiation/quality/duration/timing/severity/associated sxs/prior Treatment) Patient is a 80 y.o. male presenting with shortness of breath. The history is provided by the patient.  Shortness of Breath Severity:  Moderate Onset quality:  Gradual Duration:  4 days Timing:  Constant Progression:  Worsening Chronicity:  New Context: activity   Context: not pollens, not URI and not weather changes   Relieved by:  Nothing Worsened by:  Nothing tried Ineffective treatments:  None tried Associated symptoms: abdominal pain (flank pain) and cough   Associated symptoms: no chest pain, no fever, no headaches, no rash, no sore throat, no sputum production, no swollen glands and no vomiting   Risk factors: no family hx of DVT, no hx of cancer, no hx of PE/DVT and no prolonged immobilization     80 year old male with a chief complaint of right lower back pain and shortness of breath. Patient says has been going on for the past few days. Pain is worse when he coughs. He denies any radiation of the back pain. He denies any chest pain. Patient states that his shortness of breath is a chronic problem with his COPD and CHF. Patient denies any change in sputum production patient denies any fevers patient denies any worsening lower extremity swelling.  Past Medical History  Diagnosis Date  . Hypertension   . Shortness of breath     with exertion  . Diabetes mellitus     does not check his blood sugar at home  . Arthritis   . CHF (congestive heart failure)   . COPD (chronic obstructive pulmonary disease)   . Hyperlipidemia   . Chronic kidney disease     renal insuficiency   Past Surgical History  Procedure Laterality Date  . Leg surgery      both legs - unsure of exact operations - from  trauma  . Tonsillectomy    . Appendectomy    . Cataract extraction w/phaco Left 09/04/2013    Procedure: CATARACT EXTRACTION PHACO AND INTRAOCULAR LENS PLACEMENT ([facility]);  Surgeon: Adonis Brook, MD;  Location: [facility];  Service: Ophthalmology;  Laterality: Left;   Family History  Problem Relation Age of Onset  . Diabetes Father   . Diabetes Mother    History  Substance Use Topics  . Smoking status: Former Smoker -- 0.50 packs/day for 70 years    Types: Cigarettes    Quit date: 05/14/2009  . Smokeless tobacco: Never Used  . Alcohol Use: No    Review of Systems  Constitutional: Negative for fever and chills.  HENT: Negative for congestion, facial swelling and sore throat.   Eyes: Negative for discharge and visual disturbance.  Respiratory: Positive for cough and shortness of breath. Negative for sputum production.   Cardiovascular: Negative for chest pain and palpitations.  Gastrointestinal: Positive for abdominal pain (flank pain). Negative for vomiting and diarrhea.  Genitourinary: Positive for flank pain.  Musculoskeletal: Negative for arthralgias and myalgias.  Skin: Negative for color change and rash.  Neurological: Negative for tremors, syncope and headaches.  Psychiatric/Behavioral: Negative for confusion and dysphoric mood.    Allergies  Review of patient's allergies indicates no known allergies.  Home Medications   Current Outpatient Rx  Name  Route  Sig  Dispense  Refill  . albuterol (PROVENTIL) (  2.5 MG/3ML) 0.083% nebulizer solution   Nebulization   Take 3 mLs (2.5 mg total) by nebulization daily.   75 mL   0   . amLODipine (NORVASC) 5 MG tablet   Oral   Take 5 mg by mouth daily.         Marland Kitchen aspirin EC 81 MG tablet   Oral   Take 81 mg by mouth daily.         . benzonatate (TESSALON) 200 MG capsule   Oral   Take 200 mg by mouth 3 (three) times daily as needed for cough.         . fenofibrate 160 MG tablet   Oral   Take 160 mg by mouth daily.           . furosemide (LASIX) 40 MG tablet   Oral   Take 40 mg by mouth daily.         Marland Kitchen linagliptin (TRADJENTA) 5 MG TABS tablet   Oral   Take 5 mg by mouth daily.         Marland Kitchen lisinopril (ZESTRIL) 2.5 MG tablet   Oral   Take 1 tablet (2.5 mg total) by mouth daily.   30 tablet   1   . metoprolol succinate (TOPROL-XL) 25 MG 24 hr tablet   Oral   Take 25 mg by mouth daily.         . predniSONE (DELTASONE) 10 MG tablet   Oral   Take 4 tablets (40 mg total) by mouth daily.   16 tablet   0   . repaglinide (PRANDIN) 0.5 MG tablet   Oral   Take 1 mg by mouth 2 (two) times daily before a meal.          . TRAVATAN Z 0.004 % SOLN ophthalmic solution   Both Eyes   Place 1 drop into both eyes daily.          BP 157/60  Pulse 79  Temp(Src) 97.7 F (36.5 C) (Oral)  Resp 18  SpO2 98% Physical Exam  Constitutional: He is oriented to person, place, and time. He appears well-developed and well-nourished.  HENT:  Head: Normocephalic and atraumatic.  Eyes: EOM are normal. Pupils are equal, round, and reactive to light.  Neck: Normal range of motion. Neck supple. No JVD present.  Cardiovascular: Normal rate and regular rhythm.  Exam reveals no gallop and no friction rub.   No murmur heard. Pulmonary/Chest: No respiratory distress. He has no wheezes. He has rales in the right lower field.  Abdominal: He exhibits no distension. There is no tenderness. There is no rebound and no guarding.  Musculoskeletal: Normal range of motion.       Back:  Neurological: He is alert and oriented to person, place, and time.  Skin: No rash noted. No pallor.  Psychiatric: He has a normal mood and affect. His behavior is normal.    ED Course  Procedures (including critical care time) Labs Review Labs Reviewed  CBC - Abnormal; Notable for the following:    RBC 3.77 (*)    Hemoglobin 12.4 (*)    HCT 35.8 (*)    All other components within normal limits  BASIC METABOLIC PANEL - Abnormal;  Notable for the following:    Sodium 136 (*)    Glucose, Bld 153 (*)    BUN 31 (*)    Creatinine, Ser 1.66 (*)    GFR calc non Af Amer 37 (*)  GFR calc Af Amer 43 (*)    All other components within normal limits  URINALYSIS, ROUTINE W REFLEX MICROSCOPIC - Abnormal; Notable for the following:    Color, Urine AMBER (*)    All other components within normal limits  PRO B NATRIURETIC PEPTIDE - Abnormal; Notable for the following:    Pro B Natriuretic peptide (BNP) 6854.0 (*)    All other components within normal limits   Imaging Review Dg Chest 2 View  09/19/2013   CLINICAL DATA:  Shortness of breath  EXAM: CHEST  2 VIEW  COMPARISON:  Prior CT of from 07/31/2013 and radiograph from 07/07/2013  FINDINGS: Cardiomegaly is stable as compared to the prior exam.  Lungs are normally inflated. There is diffuse pulmonary vascular congestion weight interstitial thickening, compatible with mild pulmonary edema. A right pleural effusion is present. Associated consolidative right basilar opacity may reflect atelectasis or possibly infiltrate. No pneumothorax.  No acute osseous abnormality.  IMPRESSION: Cardiomegaly with mild diffuse pulmonary edema and moderate right pleural effusion. Associated consolidative right basilar opacity may reflect atelectasis or possibly superimposed infiltrate   Electronically Signed   By: Jeannine Boga M.D.   On: 09/19/2013 22:09    EKG Interpretation   None       MDM   1. COPD exacerbation   2. CHF exacerbation     80 year old male with the chief complaint of right flank pain shortness breath. Patient has run out of his home nebulizer. Patient with marked improvement with DuoNeb here. Patient's pain resolved and he is flank. Patient with worsening BNP infusion right-sided. Patient ambulated with no hypoxia. Patient well appearing and nontoxic afebrile doubt pneumonia. Will treat this as a COPD exacerbation patient was given prednisone for the next 4 days.  Patient given prednisone here in the ED. Patient also given a dose of Lasix as it appears he is slightly fluid overloaded. Patient will double his Lasix dose for the next day he will call his cardiologist in the morning.  12:29 AM:  I have discussed the diagnosis/risks/treatment options with the patient and believe the pt to be eligible for discharge home to follow-up with PCP, cardiology. We also discussed returning to the ED immediately if new or worsening sx occur. We discussed the sx which are most concerning (e.g., worsening shortness of breath) that necessitate immediate return. Medications administered to the patient during their visit and any new prescriptions provided to the patient are listed below.  Medications given during this visit Medications  predniSONE (DELTASONE) tablet 40 mg (40 mg Oral Given 09/19/13 2249)  furosemide (LASIX) tablet 40 mg (40 mg Oral Given 09/19/13 2348)    Discharge Medication List as of 09/19/2013 11:30 PM    START taking these medications   Details  predniSONE (DELTASONE) 10 MG tablet Take 4 tablets (40 mg total) by mouth daily., Starting 09/19/2013, Until Discontinued, Print           Deno Etienne, MD 09/20/13 [phone]

## 2013-09-19 NOTE — Discharge Instructions (Signed)
Follow up with your family doctor.  Take your medications as prescribed.  Use your albuterol inhaler every four hours.  Chronic Obstructive Pulmonary Disease Exacerbation Chronic obstructive pulmonary disease (COPD) is a common lung condition in which airflow from the lungs is limited. COPD is a general term that can be used to describe many different lung problems that limit airflow, including chronic bronchitis and emphysema. COPD exacerbations are episodes when breathing symptoms become much worse and require extra treatment. Without treatment, COPD exacerbations can be life threatening, and frequent COPD exacerbations can cause further damage to your lungs. CAUSES   Respiratory infections.   Exposure to smoke.   Exposure to air pollution, chemical fumes, or dust. Sometimes there is no apparent cause or trigger. RISK FACTORS  Smoking cigarettes.  Older age.  Frequent prior COPD exacerbations. SIGNS AND SYMPTOMS   Increased coughing.   Increased thick spit (sputum) production.   Increased wheezing.   Increased shortness of breath.   Rapid breathing.   Chest tightness. DIAGNOSIS  Your medical history, a physical exam, and tests will help your health care provider make a diagnosis. Tests may include:  A chest X-ray.  Basic lab tests.  Sputum testing.  An arterial blood gas test. TREATMENT  Depending on the severity of your COPD exacerbation, you may need to be admitted to a hospital for treatment. Some of the treatments commonly used to treat COPD exacerbations are:   Antibiotic medicines.   Bronchodilators. These are drugs that expand the air passages. They may be given with an inhaler or nebulizer. Spacer devices may be needed to help improve drug delivery.  Corticosteroid medicines.  Supplemental oxygen therapy.  HOME CARE INSTRUCTIONS   Do not smoke. Quitting smoking is very important to prevent COPD from getting worse and exacerbations from happening  as often.  Avoid exposure to all substances that irritate the airway, especially to tobacco smoke.   If prescribed, take your antibiotics as directed. Finish them even if you start to feel better.  Only take over-the-counter or prescription medicines as directed by your health care provider.It is important to use correct technique with inhaled medicines.  Drink enough fluids to keep your urine clear or pale yellow (unless you have a medical condition that requires fluid restriction).  Use a cool mist vaporizer. This makes it easier to clear your chest when you cough.   If you have a home nebulizer and oxygen, continue to use them as directed.   Maintain all necessary vaccinations to prevent infections.   Exercise regularly.   Eat a healthy diet.   Keep all follow-up appointments as directed by your health care provider. SEEK IMMEDIATE MEDICAL CARE IF:  You have worsening shortness of breath.   You have trouble talking.   You have severe chest pain.  You have blood in your sputum.  You have a fever.  You have weakness, vomit repeatedly, or faint.   You feel confused.   You continue to get worse. MAKE SURE YOU:   Understand these instructions.  Will watch your condition.  Will get help right away if you are not doing well or get worse. Document Released: 05/29/2007 Document Revised: 05/22/2013 Document Reviewed: 04/05/2013 [facility] Patient Information 2014 [place].

## 2013-09-19 NOTE — ED Notes (Signed)
Pt ambulated with cane maintaining o2 stats of 92% RA. MD aware. Okay to d/c.

## 2013-09-21 NOTE — ED Provider Notes (Signed)
I saw and evaluated the patient, reviewed the resident's note and I agree with the findings and plan.  EKG Interpretation    Date/Time:  Thursday September 19 2013 20:50:33 EST Ventricular Rate:  83 PR Interval:  158 QRS Duration: 91 QT Interval:  396 QTC Calculation: 465 R Axis:   162 Text Interpretation:  Sinus rhythm Left atrial enlargement Right axis deviation Borderline T abnormalities, lateral leads No significant change since last tracing Confirmed by Katalaya Beel  MD, Sintia Mckissic ([id number]) on 09/21/2013 9:51:38 AM           Patient presents with left flank pain.  Hx of CHF and COPD.  Wheezing on exam per EMS.  Denies SOB or CP.  IMproved with duoneb en route.  ON my evaluation, patient pain free and without wheezing on exam.  Trace lower extremity edema.  CXR with R pleural effusion that was seen on imaging study 1/27 and BNP elevated at 6854 but similar to prior BNP.  Patient treated for COPD exacerbation.  Patient wishes to go home.  In no acute respiratory distress.  Patient ambulated with pulse ox and maintained O2 saturations.  Given IV dose of lasix here.  Patient instructed to f/u with PCP on Friday.  Given strict return precautions.  After history, exam, and medical workup I feel the patient has been appropriately medically screened and is safe for discharge home. Pertinent diagnoses were discussed with the patient. Patient was given return precautions.   Results for orders placed during the hospital encounter of 09/19/13  CBC      Result Value Range   WBC 8.7  4.0 - 10.5 K/uL   RBC 3.77 (*) 4.22 - 5.81 MIL/uL   Hemoglobin 12.4 (*) 13.0 - 17.0 g/dL   HCT 35.8 (*) 39.0 - 52.0 %   MCV 95.0  78.0 - 100.0 fL   MCH 32.9  26.0 - 34.0 pg   MCHC 34.6  30.0 - 36.0 g/dL   RDW 14.4  11.5 - 15.5 %   Platelets 295  150 - 400 K/uL  BASIC METABOLIC PANEL      Result Value Range   Sodium 136 (*) 137 - 147 mEq/L   Potassium 3.9  3.7 - 5.3 mEq/L   Chloride 97  96 - 112 mEq/L   CO2 21  19 -  32 mEq/L   Glucose, Bld 153 (*) 70 - 99 mg/dL   BUN 31 (*) 6 - 23 mg/dL   Creatinine, Ser 1.66 (*) 0.50 - 1.35 mg/dL   Calcium 9.1  8.4 - 10.5 mg/dL   GFR calc non Af Amer 37 (*) >90 mL/min   GFR calc Af Amer 43 (*) >90 mL/min  URINALYSIS, ROUTINE W REFLEX MICROSCOPIC      Result Value Range   Color, Urine AMBER (*) YELLOW   APPearance CLEAR  CLEAR   Specific Gravity, Urine 1.017  1.005 - 1.030   pH 5.0  5.0 - 8.0   Glucose, UA NEGATIVE  NEGATIVE mg/dL   Hgb urine dipstick NEGATIVE  NEGATIVE   Bilirubin Urine NEGATIVE  NEGATIVE   Ketones, ur NEGATIVE  NEGATIVE mg/dL   Protein, ur NEGATIVE  NEGATIVE mg/dL   Urobilinogen, UA 1.0  0.0 - 1.0 mg/dL   Nitrite NEGATIVE  NEGATIVE   Leukocytes, UA NEGATIVE  NEGATIVE  PRO B NATRIURETIC PEPTIDE      Result Value Range   Pro B Natriuretic peptide (BNP) 6854.0 (*) 0 - 450 pg/mL   Dg Chest 2 View  09/19/2013   CLINICAL DATA:  Shortness of breath  EXAM: CHEST  2 VIEW  COMPARISON:  Prior CT of from 07/31/2013 and radiograph from 07/07/2013  FINDINGS: Cardiomegaly is stable as compared to the prior exam.  Lungs are normally inflated. There is diffuse pulmonary vascular congestion weight interstitial thickening, compatible with mild pulmonary edema. A right pleural effusion is present. Associated consolidative right basilar opacity may reflect atelectasis or possibly infiltrate. No pneumothorax.  No acute osseous abnormality.  IMPRESSION: Cardiomegaly with mild diffuse pulmonary edema and moderate right pleural effusion. Associated consolidative right basilar opacity may reflect atelectasis or possibly superimposed infiltrate   Electronically Signed   By: Jeannine Boga M.D.   On: 09/19/2013 22:09          Merryl Hacker, MD 09/21/13 [id number]

## 2013-10-11 ENCOUNTER — Ambulatory Visit: Payer: Medicare Other | Admitting: Internal Medicine

## 2013-10-14 ENCOUNTER — Ambulatory Visit: Payer: Medicare Other | Admitting: Internal Medicine

## 2013-10-30 ENCOUNTER — Other Ambulatory Visit: Payer: Self-pay | Admitting: Cardiology

## 2013-10-30 DIAGNOSIS — I872 Venous insufficiency (chronic) (peripheral): Secondary | ICD-10-CM

## 2013-11-01 ENCOUNTER — Ambulatory Visit (INDEPENDENT_AMBULATORY_CARE_PROVIDER_SITE_OTHER): Payer: Medicare Other | Admitting: Internal Medicine

## 2013-11-01 ENCOUNTER — Other Ambulatory Visit (INDEPENDENT_AMBULATORY_CARE_PROVIDER_SITE_OTHER): Payer: Medicare Other

## 2013-11-01 ENCOUNTER — Encounter: Payer: Self-pay | Admitting: Internal Medicine

## 2013-11-01 VITALS — BP 120/70 | HR 89 | Ht 69.0 in | Wt 258.2 lb

## 2013-11-01 DIAGNOSIS — R0989 Other specified symptoms and signs involving the circulatory and respiratory systems: Secondary | ICD-10-CM

## 2013-11-01 DIAGNOSIS — R0600 Dyspnea, unspecified: Secondary | ICD-10-CM

## 2013-11-01 DIAGNOSIS — R0602 Shortness of breath: Secondary | ICD-10-CM

## 2013-11-01 DIAGNOSIS — R0609 Other forms of dyspnea: Secondary | ICD-10-CM

## 2013-11-01 LAB — BASIC METABOLIC PANEL
BUN: 28 mg/dL — ABNORMAL HIGH (ref 6–23)
CO2: 29 mEq/L (ref 19–32)
Calcium: 9.1 mg/dL (ref 8.4–10.5)
Chloride: 99 mEq/L (ref 96–112)
Creatinine, Ser: 1.8 mg/dL — ABNORMAL HIGH (ref 0.4–1.5)
GFR: 47.73 mL/min — AB (ref >60.00)
Glucose, Bld: 95 mg/dL (ref 70–99)
POTASSIUM: 3.9 meq/L (ref 3.5–5.1)
Sodium: 137 mEq/L (ref 135–145)

## 2013-11-01 LAB — SEDIMENTATION RATE: Sed Rate: 14 mm/hr (ref 0–22)

## 2013-11-01 LAB — RHEUMATOID FACTOR: Rhuematoid fact SerPl-aCnc: <10 IU/mL (ref <=14)

## 2013-11-01 MED ORDER — TORSEMIDE 20 MG PO TABS: 20.0000 mg | ORAL_TABLET | Freq: Two times a day (BID) | ORAL | Status: DC

## 2013-11-01 MED ORDER — PREDNISONE 10 MG PO TABS: ORAL_TABLET | ORAL | Status: DC

## 2013-11-01 NOTE — Patient Instructions (Addendum)
You are wheezing and gaining weight - could be heart or lung Dr Einar Gip and I feel that you should take medications regularly   For heart: Dr Einar Gip says you should be on torsemide 22m twice daily; make sure you take this  For lung: Please take prednisone 40 mg x1 day, then 30 mg x1 day, then 20 mg x1 day, then 10 mg x1 day, and then 5 mg x1 day and stop  Do blood work: check BNP, Serum: ESR, ANA, DS-DNA, RF, anti-CCP, ssA, ssB, scl-70, ANCA, Total CK,  Aldolase,    [place]   followup 2 weeks with my NP for medicine calendar and lab review

## 2013-11-01 NOTE — Assessment & Plan Note (Signed)
You are wheezing and gaining weight - could be heart or lung Dr Einar Gip and I feel that you should take medications regularly   For heart: Dr Einar Gip says you should be on torsemide 99m twice daily; make sure you take this  For lung: Please take prednisone 40 mg x1 day, then 30 mg x1 day, then 20 mg x1 day, then 10 mg x1 day, and then 5 mg x1 day and stop  Do blood work: check BNP, Serum: ESR, ANA, DS-DNA, RF, anti-CCP, ssA, ssB, scl-70, ANCA, Total CK,  Aldolase,    [place]   followup 2 weeks with my NP for medicine calendar and lab review  > 50% of this > 25 min visit spent in face to face counseling (15 min visit converted to 25 min)

## 2013-11-01 NOTE — Progress Notes (Signed)
Subjective:    Patient ID: [name] [name], male    DOB: 08-21-1930, 80 y.o.   MRN: [id number]  HPI  CP [name], MD REferred by [name] HPI   IOV 07/25/2013  Chief Complaint  Patient presents with  . Pulmonary Consult    for SOB with activity. States PCP started him on oxygen to use with exertion and at bedtime.    80 year old [place] American male referred for dyspnea. He is a poor historian as is his caretaker is. I cannot get much information out of him. The best I can gather is that he said insidious onset of dyspnea for the last 2 months. Worsened with exertion for minimal activities. Relieved by rest. Dyspnea is rated as moderate. It got worse with admission to the hospital in October 2014 for heart failure but since then has improved but is still persistent and stable. There is associated wheeze but no associated chest pain or edema. Though he maintained that he gets dyspneic for minimal activities and apparently yesterday he desaturated in the primary care physician's office. However, today we walked him 108 5 feet x2 laps on room air. He stopped due to claudication of his lower extremities and not due to dyspnea. He did not desaturate.  Relevant investigations are as below   Pulmonary Relevant Hx  Smoking:  reports that he quit smoking about 4 years ago. His smoking use included Cigarettes. He has a 35 pack-year smoking history. He has never used smokeless tobacco.  CT cest Jan 2013 reviewed image: 6mm LUL inferior lobe nodule. Pleural thickening at base. Not hadd FU CT  WEight Body mass index is 32.94 kg/(m^2).  CKD STage 3, creat 1.36 Nov 2014  October 2014 due to decompensated congestive heart failure not otherwise specified with associated acute respiratory failure. Also not otherwise specified. Followed with Dr. [name] on 07/09/2013 and found to have been improved  Relevant cardiac investigations include echocardiogram 03/25/2013 with normal LV systolic  ejection fraction 55%. Severe pulmonary hypertension. PA systolic pressure 61.  BNP at this time 12K  Myoview stress test 05/10/2013: Stress test was terminated due to completion of protocol. Left ventricle was mild to moderately dilated at both rest and stress. There was diaphragmatic attenuation but statistically no significant change you. Left ventricle ejection fraction was 40%. Low risk study it is the conclusion   CXR Nov 2014: CHF  V  ILD (pro-BNP 6K at this time)   #Shortness of breath Unclear why short of breath Do overnight  Oxygen study Do full PFT test called breathing test Do CT chest   High Resolution CT chest without contrast on ILD protocol. Only  Dr Lorin Picket or Dr. Vinnie Langton to read  #Lung nodule 39mm Left Upper lobe in Jan 2013  - do fu High Resolution CT chest without contrast on ILD protocol. Only  Dr Lorin Picket or Dr. Vinnie Langton to read  #Followup Finish the tests oxygen study, breathing test, CT chest and return to see me orNP next few weeks   OV 11/01/2013  Chief Complaint  Patient presents with  . Follow-up    P tis having increased SOB and increased swelling. Pt states he weighed 240lbs on 10-30-13.     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 Both  he and care taker have no idea what medications he is on. Marland Kitchen He is reporting increased weight gain. I spoke to Dr. Einar Gip and 3 days ago his weight was 253 pounds. Today the weight is 258 pounds. He has been gaining weight increasingly over the last several weeks. According to Dr. Einar Gip his cardiologist patient is extremely noncompliant and does not know his medications. His caretaker nurse also does not know the medications properly. His cardiologist  onn  10/29/2013 Lasix was changed to torsemide. However, patient medical caretaker insistt the  new medication is fenofibrate acid and show me the bottle. She insists his portal was dated 10/29/2013 but in fact this report was dated September 2014. I explained to her that this is not a diuretic but anticholesterol medication but she has no idea what I'm talking about. Nevertheless all this has resulted in increasing weight gain and also increasing shortness of breath and wheezing. Yesterday he was seen in the primary care office and apparently did not respond to nebulizer treatment and was given a steroid shot which helped. Of note, the medicine bag does NOT have a single diuretic in it  The above noncompliance has resulted in interruption in his interstitial lung disease workup. He has NOT yet had his autoimmune panel.  Review of Systems  Constitutional: Negative for fever and unexpected weight change.  HENT: Negative for congestion, dental problem, ear pain, nosebleeds, postnasal drip, rhinorrhea, sinus pressure, sneezing, sore throat and trouble swallowing.   Eyes: Negative for redness and itching.  Respiratory: Positive for shortness of breath. Negative for cough, chest tightness and wheezing.   Cardiovascular: Positive for leg swelling. Negative for palpitations.  Gastrointestinal: Negative for nausea and vomiting.  Genitourinary: Negative for dysuria.  Musculoskeletal: Negative for joint swelling.  Skin: Negative for rash.  Neurological: Negative for headaches.  Hematological: Does not bruise/bleed easily.  Psychiatric/Behavioral: Negative for dysphoric mood. The patient is not nervous/anxious.        Objective:   Physical Exam  Nursing note and vitals reviewed. Constitutional: He is oriented to person, place, and time. He appears well-developed and well-nourished. No distress.  Obese Deconditioned Sitting in wheel chair Disheveled  HENT:  Head: Normocephalic and atraumatic.  Right  Ear: External ear normal.  Left Ear: External ear normal.  Mouth/Throat: Oropharynx is clear and moist. No oropharyngeal exudate.  Eyes: Conjunctivae and EOM are normal. Pupils are equal, round, and reactive to light. Right eye exhibits no discharge. Left eye exhibits no discharge. No scleral icterus.  Neck: Normal range of motion. Neck supple. No JVD present. No tracheal deviation present. No thyromegaly present.  Cardiovascular: Normal rate, regular rhythm and intact distal pulses.  Exam reveals no gallop and no friction rub.   No murmur heard. Pulmonary/Chest: Effort normal. No respiratory distress. He has wheezes. He has no rales. He exhibits no tenderness.  Abdominal: Soft. Bowel sounds are normal. He exhibits no distension and no mass. There is no tenderness. There is no rebound and no guarding.  Musculoskeletal: Normal range of motion. He exhibits edema. He exhibits no tenderness.  Chronic edema  Lymphadenopathy:    He has no cervical adenopathy.  Neurological: He is alert and oriented to person, place, and time. He has normal reflexes. No cranial nerve deficit. Coordination normal.  Skin: Skin is warm and dry. No rash noted. He is not diaphoretic. No erythema. No pallor.  Psychiatric: He has a normal mood and affect. His behavior is normal. Judgment and thought content normal.  Estimated body mass index is 38.11 kg/(m^2) as calculated from the following:   Height as of this encounter: 5\' 9"  (1.753 m).   Weight as of this encounter: 258 lb 3.2 oz (117.119 kg).        Assessment & Plan:

## 2013-11-02 LAB — CK TOTAL AND CKMB (NOT AT ARMC)
CK, MB: 4.3 ng/mL (ref 0.0–5.0)
RELATIVE INDEX: 1.6 (ref 0.0–4.0)
Total CK: 261 U/L — ABNORMAL HIGH (ref 7–232)

## 2013-11-04 ENCOUNTER — Emergency Department (HOSPITAL_COMMUNITY): Payer: Medicare Other

## 2013-11-04 ENCOUNTER — Inpatient Hospital Stay (HOSPITAL_COMMUNITY)
Admission: EM | Admit: 2013-11-04 | Discharge: 2013-11-09 | DRG: 291 | Disposition: A | Discharge status: Home Health | Payer: Medicare Other | Attending: Internal Medicine | Admitting: Internal Medicine

## 2013-11-04 ENCOUNTER — Encounter (HOSPITAL_COMMUNITY): Payer: Self-pay | Admitting: Emergency Medicine

## 2013-11-04 DIAGNOSIS — J4489 Other specified chronic obstructive pulmonary disease: Secondary | ICD-10-CM | POA: Diagnosis present

## 2013-11-04 DIAGNOSIS — J189 Pneumonia, unspecified organism: Secondary | ICD-10-CM | POA: Diagnosis present

## 2013-11-04 DIAGNOSIS — E1165 Type 2 diabetes mellitus with hyperglycemia: Secondary | ICD-10-CM

## 2013-11-04 DIAGNOSIS — I5033 Acute on chronic diastolic (congestive) heart failure: Principal | ICD-10-CM | POA: Diagnosis present

## 2013-11-04 DIAGNOSIS — Z87891 Personal history of nicotine dependence: Secondary | ICD-10-CM

## 2013-11-04 DIAGNOSIS — N183 Chronic kidney disease, stage 3 unspecified: Secondary | ICD-10-CM | POA: Diagnosis present

## 2013-11-04 DIAGNOSIS — J449 Chronic obstructive pulmonary disease, unspecified: Secondary | ICD-10-CM | POA: Diagnosis present

## 2013-11-04 DIAGNOSIS — E860 Dehydration: Secondary | ICD-10-CM

## 2013-11-04 DIAGNOSIS — E785 Hyperlipidemia, unspecified: Secondary | ICD-10-CM | POA: Diagnosis present

## 2013-11-04 DIAGNOSIS — N179 Acute kidney failure, unspecified: Secondary | ICD-10-CM | POA: Diagnosis present

## 2013-11-04 DIAGNOSIS — Z9849 Cataract extraction status, unspecified eye: Secondary | ICD-10-CM

## 2013-11-04 DIAGNOSIS — IMO0001 Reserved for inherently not codable concepts without codable children: Secondary | ICD-10-CM | POA: Diagnosis present

## 2013-11-04 DIAGNOSIS — I10 Essential (primary) hypertension: Secondary | ICD-10-CM | POA: Diagnosis present

## 2013-11-04 DIAGNOSIS — E876 Hypokalemia: Secondary | ICD-10-CM

## 2013-11-04 DIAGNOSIS — W19XXXA Unspecified fall, initial encounter: Secondary | ICD-10-CM

## 2013-11-04 DIAGNOSIS — R911 Solitary pulmonary nodule: Secondary | ICD-10-CM

## 2013-11-04 DIAGNOSIS — Z91199 Patient's noncompliance with other medical treatment and regimen due to unspecified reason: Secondary | ICD-10-CM

## 2013-11-04 DIAGNOSIS — L988 Other specified disorders of the skin and subcutaneous tissue: Secondary | ICD-10-CM | POA: Diagnosis present

## 2013-11-04 DIAGNOSIS — E871 Hypo-osmolality and hyponatremia: Secondary | ICD-10-CM

## 2013-11-04 DIAGNOSIS — I2789 Other specified pulmonary heart diseases: Secondary | ICD-10-CM | POA: Diagnosis present

## 2013-11-04 DIAGNOSIS — I129 Hypertensive chronic kidney disease with stage 1 through stage 4 chronic kidney disease, or unspecified chronic kidney disease: Secondary | ICD-10-CM | POA: Diagnosis present

## 2013-11-04 DIAGNOSIS — E162 Hypoglycemia, unspecified: Secondary | ICD-10-CM

## 2013-11-04 DIAGNOSIS — R600 Localized edema: Secondary | ICD-10-CM | POA: Diagnosis present

## 2013-11-04 DIAGNOSIS — J90 Pleural effusion, not elsewhere classified: Secondary | ICD-10-CM

## 2013-11-04 DIAGNOSIS — Z79899 Other long term (current) drug therapy: Secondary | ICD-10-CM

## 2013-11-04 DIAGNOSIS — R0600 Dyspnea, unspecified: Secondary | ICD-10-CM

## 2013-11-04 DIAGNOSIS — I509 Heart failure, unspecified: Secondary | ICD-10-CM | POA: Diagnosis present

## 2013-11-04 DIAGNOSIS — Z9119 Patient's noncompliance with other medical treatment and regimen: Secondary | ICD-10-CM

## 2013-11-04 DIAGNOSIS — Z7982 Long term (current) use of aspirin: Secondary | ICD-10-CM

## 2013-11-04 LAB — CBC WITH DIFFERENTIAL/PLATELET
Basophils Absolute: 0 10*3/uL (ref 0.0–0.1)
Basophils Relative: 1 % (ref 0–1)
EOS ABS: 0.3 10*3/uL (ref 0.0–0.7)
Eosinophils Relative: 4 % (ref 0–5)
HCT: 38.7 % — ABNORMAL LOW (ref 39.0–52.0)
HEMOGLOBIN: 13.1 g/dL (ref 13.0–17.0)
LYMPHS PCT: 24 % (ref 12–46)
Lymphs Abs: 1.7 10*3/uL (ref 0.7–4.0)
MCH: 32.8 pg (ref 26.0–34.0)
MCHC: 33.9 g/dL (ref 30.0–36.0)
MCV: 97 fL (ref 78.0–100.0)
MONOS PCT: 14 % — AB (ref 3–12)
Monocytes Absolute: 1 10*3/uL (ref 0.1–1.0)
NEUTROS PCT: 58 % (ref 43–77)
Neutro Abs: 4.3 10*3/uL (ref 1.7–7.7)
Platelets: 207 10*3/uL (ref 150–400)
RBC: 3.99 MIL/uL — AB (ref 4.22–5.81)
RDW: 14.7 % (ref 11.5–15.5)
WBC: 7.4 10*3/uL (ref 4.0–10.5)

## 2013-11-04 LAB — COMPREHENSIVE METABOLIC PANEL
ALK PHOS: 39 U/L (ref 39–117)
ALT: 37 U/L (ref 0–53)
AST: 75 U/L — ABNORMAL HIGH (ref 0–37)
Albumin: 3.3 g/dL — ABNORMAL LOW (ref 3.5–5.2)
BILIRUBIN TOTAL: 1 mg/dL (ref 0.3–1.2)
BUN: 41 mg/dL — ABNORMAL HIGH (ref 6–23)
CHLORIDE: 97 meq/L (ref 96–112)
CO2: 26 meq/L (ref 19–32)
Calcium: 9.3 mg/dL (ref 8.4–10.5)
Creatinine, Ser: 1.86 mg/dL — ABNORMAL HIGH (ref 0.50–1.35)
GFR calc non Af Amer: 33 mL/min — ABNORMAL LOW (ref >90)
GFR, EST AFRICAN AMERICAN: 38 mL/min — AB (ref >90)
GLUCOSE: 117 mg/dL — AB (ref 70–99)
POTASSIUM: 5 meq/L (ref 3.7–5.3)
Sodium: 136 mEq/L — ABNORMAL LOW (ref 137–147)
TOTAL PROTEIN: 7 g/dL (ref 6.0–8.3)

## 2013-11-04 LAB — TROPONIN I: Troponin I: <0.3 ng/mL (ref <0.30)

## 2013-11-04 LAB — SJOGRENS SYNDROME-A EXTRACTABLE NUCLEAR ANTIBODY: SSA (RO) (ENA) ANTIBODY, IGG: NEGATIVE

## 2013-11-04 LAB — ANCA SCREEN W REFLEX TITER
Atypical p-ANCA Screen: NEGATIVE
C-ANCA SCREEN: NEGATIVE
p-ANCA Screen: NEGATIVE

## 2013-11-04 LAB — ANA: ANA: NEGATIVE

## 2013-11-04 LAB — SJOGRENS SYNDROME-B EXTRACTABLE NUCLEAR ANTIBODY: SSB (La) (ENA) Antibody, IgG: <1

## 2013-11-04 LAB — CYCLIC CITRUL PEPTIDE ANTIBODY, IGG: Cyclic Citrullin Peptide Ab: <2 U/mL (ref 0.0–5.0)

## 2013-11-04 LAB — ANTI-SCLERODERMA ANTIBODY: SCLERODERMA (SCL-70) (ENA) ANTIBODY, IGG: NEGATIVE

## 2013-11-04 LAB — ANTI-DNA ANTIBODY, DOUBLE-STRANDED: ds DNA Ab: <1 IU/mL

## 2013-11-04 LAB — PRO B NATRIURETIC PEPTIDE: Pro B Natriuretic peptide (BNP): 8116 pg/mL — ABNORMAL HIGH (ref 0–450)

## 2013-11-04 MED ORDER — SODIUM CHLORIDE 0.9 % IV SOLN: INTRAVENOUS | Status: DC

## 2013-11-04 MED ORDER — IPRATROPIUM-ALBUTEROL 0.5-2.5 (3) MG/3ML IN SOLN
3.0000 mL | Freq: Once | RESPIRATORY_TRACT | Status: AC
  Administered 2013-11-04: 3 mL via RESPIRATORY_TRACT
  Filled 2013-11-04: qty 3

## 2013-11-04 MED ORDER — LEVOFLOXACIN IN D5W 750 MG/150ML IV SOLN
750.0000 mg | INTRAVENOUS | Status: DC
  Administered 2013-11-07: 750 mg via INTRAVENOUS
  Filled 2013-11-04 (×2): qty 150

## 2013-11-04 MED ORDER — LEVOFLOXACIN IN D5W 750 MG/150ML IV SOLN
750.0000 mg | Freq: Once | INTRAVENOUS | Status: AC
  Administered 2013-11-05: 750 mg via INTRAVENOUS
  Filled 2013-11-04: qty 150

## 2013-11-04 MED ORDER — INFLUENZA VAC SPLIT QUAD 0.5 ML IM SUSP: 0.5000 mL | INTRAMUSCULAR | Status: DC

## 2013-11-04 MED ORDER — FUROSEMIDE 10 MG/ML IJ SOLN
40.0000 mg | Freq: Once | INTRAMUSCULAR | Status: AC
  Administered 2013-11-04: 40 mg via INTRAVENOUS
  Filled 2013-11-04: qty 4

## 2013-11-04 NOTE — Progress Notes (Signed)
Pt arrived to floor in NAD, VSS, pt requesting medicine for pain in legs and a breathing treatment. Pt states admitting MD has not seen him yet. MD paged for admission orders. Will continue to monitor. Ronnette Hila, RN

## 2013-11-04 NOTE — ED Provider Notes (Signed)
CSN: [id number]     Arrival date & time 11/04/13  1909 History   First MD Initiated Contact with Patient 11/04/13 1915     Chief Complaint  Patient presents with  . Leg Pain     (Consider location/radiation/quality/duration/timing/severity/associated sxs/prior Treatment) HPI Comments: Patient from home with worsening bilateral leg swelling and pain for the past 3 days. He has a history of CHF, COPD and ILD He saw his pulmonologist 3 days ago. He states the pain in his legs has gotten worse and he has weeping blisters. He is a poor historian. He denies any chest pain or worsening shortness of breath but he appears to be dyspneic on arrival. No history of fevers no cough, nausea or vomiting.  Saw [name] 3 days ago for dyspnea and CT chest, sleep study and PFTs ordered. Patient states leg pain and swelling has worsened since then.   The history is provided by the patient and the EMS personnel. The history is limited by the condition of the patient.    Past Medical History  Diagnosis Date  . Hypertension   . Shortness of breath     with exertion  . Diabetes mellitus     does not check his blood sugar at home  . Arthritis   . CHF (congestive heart failure)   . COPD (chronic obstructive pulmonary disease)   . Hyperlipidemia   . Chronic kidney disease     renal insuficiency   Past Surgical History  Procedure Laterality Date  . Leg surgery      both legs - unsure of exact operations - from trauma  . Tonsillectomy    . Appendectomy    . Cataract extraction w/phaco Left 09/04/2013    Procedure: CATARACT EXTRACTION PHACO AND INTRAOCULAR LENS PLACEMENT (IOC);  Surgeon: Adonis [facility], MD;  Location: [facility];  Service: Ophthalmology;  Laterality: Left;   Family History  Problem Relation Age of Onset  . Diabetes Father   . Diabetes Mother    History  Substance Use Topics  . Smoking status: Former Smoker -- 0.50 packs/day for 70 years    Types: Cigarettes    Quit date: 05/14/2009   . Smokeless tobacco: Never Used  . Alcohol Use: No    Review of Systems  Unable to perform ROS: Dementia      Allergies  Review of patient's allergies indicates no known allergies.  Home Medications   No current outpatient prescriptions on file. BP 149/82  Pulse 83  Temp(Src) 99.4 F (37.4 C) (Oral)  Resp 20  Ht 5\' 9"  (1.753 m)  Wt 252 lb (114.306 kg)  BMI 37.20 kg/m2  SpO2 96% Physical Exam  Constitutional: He appears well-developed and well-nourished. No distress.  HENT:  Head: Normocephalic and atraumatic.  Mouth/Throat: Oropharynx is clear and moist. No oropharyngeal exudate.  Eyes: Conjunctivae and EOM are normal. Pupils are equal, round, and reactive to light.  Neck: Normal range of motion. Neck supple.  Cardiovascular: Normal rate, regular rhythm and normal heart sounds.   No murmur heard. Pulmonary/Chest: He has wheezes.  Bibasilar rales  Abdominal: Soft. There is no tenderness. There is no rebound and no guarding.  Musculoskeletal: He exhibits edema and tenderness.  Bilateral pitting edema to the thighs with multiple blisters. One area of open blister to the right anterior shin. Unable to feel pulses secondary to edema.  Dopplerable PT and DP pulses  Neurological: He is alert. No cranial nerve deficit. He exhibits normal muscle tone. Coordination normal.  Skin: Skin is warm.    ED Course  Procedures (including critical care time) Labs Review Labs Reviewed  CBC WITH DIFFERENTIAL - Abnormal; Notable for the following:    RBC 3.99 (*)    HCT 38.7 (*)    Monocytes Relative 14 (*)    All other components within normal limits  COMPREHENSIVE METABOLIC PANEL - Abnormal; Notable for the following:    Sodium 136 (*)    Glucose, Bld 117 (*)    BUN 41 (*)    Creatinine, Ser 1.86 (*)    Albumin 3.3 (*)    AST 75 (*)    GFR calc non Af Amer 33 (*)    GFR calc Af Amer 38 (*)    All other components within normal limits  PRO B NATRIURETIC PEPTIDE - Abnormal;  Notable for the following:    Pro B Natriuretic peptide (BNP) 8116.0 (*)    All other components within normal limits  GLUCOSE, CAPILLARY - Abnormal; Notable for the following:    Glucose-Capillary 105 (*)    All other components within normal limits  COMPREHENSIVE METABOLIC PANEL - Abnormal; Notable for the following:    Potassium 3.6 (*)    Glucose, Bld 102 (*)    BUN 41 (*)    Creatinine, Ser 1.88 (*)    Albumin 3.1 (*)    AST 57 (*)    Alkaline Phosphatase 36 (*)    GFR calc non Af Amer 32 (*)    GFR calc Af Amer 37 (*)    All other components within normal limits  CBC WITH DIFFERENTIAL - Abnormal; Notable for the following:    RBC 3.67 (*)    Hemoglobin 11.8 (*)    HCT 34.9 (*)    Monocytes Relative 13 (*)    All other components within normal limits  GLUCOSE, CAPILLARY - Abnormal; Notable for the following:    Glucose-Capillary 109 (*)    All other components within normal limits  GLUCOSE, CAPILLARY - Abnormal; Notable for the following:    Glucose-Capillary 136 (*)    All other components within normal limits  TROPONIN I  TROPONIN I  TSH  CBC   Imaging Review Dg Chest Portable 1 View  11/04/2013   CLINICAL DATA:  Leg pain, shortness of Breath  EXAM: PORTABLE CHEST - 1 VIEW  COMPARISON:  09/19/2013  FINDINGS: Cardiomegaly again noted. Central mild vascular congestion without convincing pulmonary edema. There is moderate right pleural effusion with right lower lobe atelectasis or infiltrate.  IMPRESSION: Cardiomegaly. Moderate right pleural effusion with right lower lobe atelectasis or infiltrate.   Electronically Signed   By: Lahoma Crocker M.D.   On: 11/04/2013 19:57     EKG Interpretation None      MDM   Final diagnoses:  Pleural effusion  Dyspnea   3 day  history of worsening leg pain and leg swelling with open blisters. No chest pain. Shortness of breath at baseline.  Edema in legs likely 2/2 CHF. CXR shows cardiomegaly and R sided effusion. BNP 8000,  Creatinine at baseline. EKG unchanged, troponin negative.  Recent records reviewed: Relevant cardiac investigations include echocardiogram 03/25/2013 with normal LV systolic ejection fraction 55%. Severe pulmonary hypertension. PA systolic pressure 61. BNP at this time 12K  Myoview stress test 05/10/2013: Stress test was terminated due to completion of protocol. Left ventricle was mild to moderately dilated at both rest and stress. There was diaphragmatic attenuation but statistically no significant change you. Left ventricle ejection fraction  was 40%. Low risk study it is the conclusion  Patient appears to have decompensated CHF with edema in legs and worsening DOE.  Lasix given. Admission d/w Dr. Hal Hope.   Ezequiel Essex, MD 11/05/13 1028

## 2013-11-04 NOTE — ED Notes (Signed)
Pt to ED via [facility] for evaluation of bilateral leg weeping and sharp pains onset Friday, pt reports pains have gotten worse.  Open blisters noted to bilateral lower extremities.

## 2013-11-04 NOTE — ED Notes (Signed)
Pulse - Ox. While ambulating is 91% RA.

## 2013-11-05 ENCOUNTER — Encounter (HOSPITAL_COMMUNITY): Payer: Self-pay | Admitting: Internal Medicine

## 2013-11-05 DIAGNOSIS — N183 Chronic kidney disease, stage 3 unspecified: Secondary | ICD-10-CM

## 2013-11-05 DIAGNOSIS — E871 Hypo-osmolality and hyponatremia: Secondary | ICD-10-CM

## 2013-11-05 DIAGNOSIS — N179 Acute kidney failure, unspecified: Secondary | ICD-10-CM

## 2013-11-05 DIAGNOSIS — M79609 Pain in unspecified limb: Secondary | ICD-10-CM

## 2013-11-05 DIAGNOSIS — E876 Hypokalemia: Secondary | ICD-10-CM

## 2013-11-05 DIAGNOSIS — R600 Localized edema: Secondary | ICD-10-CM | POA: Diagnosis present

## 2013-11-05 DIAGNOSIS — I10 Essential (primary) hypertension: Secondary | ICD-10-CM

## 2013-11-05 DIAGNOSIS — IMO0001 Reserved for inherently not codable concepts without codable children: Secondary | ICD-10-CM

## 2013-11-05 DIAGNOSIS — I509 Heart failure, unspecified: Secondary | ICD-10-CM

## 2013-11-05 DIAGNOSIS — I5033 Acute on chronic diastolic (congestive) heart failure: Principal | ICD-10-CM

## 2013-11-05 DIAGNOSIS — R609 Edema, unspecified: Secondary | ICD-10-CM

## 2013-11-05 DIAGNOSIS — J90 Pleural effusion, not elsewhere classified: Secondary | ICD-10-CM

## 2013-11-05 DIAGNOSIS — E1165 Type 2 diabetes mellitus with hyperglycemia: Secondary | ICD-10-CM

## 2013-11-05 LAB — GLUCOSE, CAPILLARY
GLUCOSE-CAPILLARY: 109 mg/dL — AB (ref 70–99)
GLUCOSE-CAPILLARY: 136 mg/dL — AB (ref 70–99)
Glucose-Capillary: 105 mg/dL — ABNORMAL HIGH (ref 70–99)
Glucose-Capillary: 118 mg/dL — ABNORMAL HIGH (ref 70–99)
Glucose-Capillary: 160 mg/dL — ABNORMAL HIGH (ref 70–99)
Glucose-Capillary: 47 mg/dL — ABNORMAL LOW (ref 70–99)
Glucose-Capillary: 75 mg/dL (ref 70–99)

## 2013-11-05 LAB — COMPREHENSIVE METABOLIC PANEL
ALT: 33 U/L (ref 0–53)
AST: 57 U/L — ABNORMAL HIGH (ref 0–37)
Albumin: 3.1 g/dL — ABNORMAL LOW (ref 3.5–5.2)
Alkaline Phosphatase: 36 U/L — ABNORMAL LOW (ref 39–117)
BILIRUBIN TOTAL: 1.2 mg/dL (ref 0.3–1.2)
BUN: 41 mg/dL — AB (ref 6–23)
CALCIUM: 8.8 mg/dL (ref 8.4–10.5)
CHLORIDE: 97 meq/L (ref 96–112)
CO2: 25 meq/L (ref 19–32)
CREATININE: 1.88 mg/dL — AB (ref 0.50–1.35)
GFR calc Af Amer: 37 mL/min — ABNORMAL LOW (ref >90)
GFR, EST NON AFRICAN AMERICAN: 32 mL/min — AB (ref >90)
Glucose, Bld: 102 mg/dL — ABNORMAL HIGH (ref 70–99)
Potassium: 3.6 mEq/L — ABNORMAL LOW (ref 3.7–5.3)
Sodium: 137 mEq/L (ref 137–147)
Total Protein: 6.3 g/dL (ref 6.0–8.3)

## 2013-11-05 LAB — CBC WITH DIFFERENTIAL/PLATELET
Basophils Absolute: 0 10*3/uL (ref 0.0–0.1)
Basophils Relative: 0 % (ref 0–1)
EOS ABS: 0.2 10*3/uL (ref 0.0–0.7)
Eosinophils Relative: 3 % (ref 0–5)
HCT: 34.9 % — ABNORMAL LOW (ref 39.0–52.0)
Hemoglobin: 11.8 g/dL — ABNORMAL LOW (ref 13.0–17.0)
LYMPHS PCT: 20 % (ref 12–46)
Lymphs Abs: 1.5 10*3/uL (ref 0.7–4.0)
MCH: 32.2 pg (ref 26.0–34.0)
MCHC: 33.8 g/dL (ref 30.0–36.0)
MCV: 95.1 fL (ref 78.0–100.0)
Monocytes Absolute: 1 10*3/uL (ref 0.1–1.0)
Monocytes Relative: 13 % — ABNORMAL HIGH (ref 3–12)
Neutro Abs: 4.5 10*3/uL (ref 1.7–7.7)
Neutrophils Relative %: 63 % (ref 43–77)
PLATELETS: 206 10*3/uL (ref 150–400)
RBC: 3.67 MIL/uL — AB (ref 4.22–5.81)
RDW: 14.5 % (ref 11.5–15.5)
WBC: 7.2 10*3/uL (ref 4.0–10.5)

## 2013-11-05 LAB — ALDOLASE: ALDOLASE: 6.9 U/L (ref <=8.1)

## 2013-11-05 LAB — TROPONIN I: Troponin I: <0.3 ng/mL (ref <0.30)

## 2013-11-05 LAB — TSH: TSH: 2.897 u[IU]/mL (ref 0.350–4.500)

## 2013-11-05 MED ORDER — ALBUTEROL SULFATE (2.5 MG/3ML) 0.083% IN NEBU: 2.5000 mg | INHALATION_SOLUTION | Freq: Four times a day (QID) | RESPIRATORY_TRACT | Status: DC

## 2013-11-05 MED ORDER — ALBUTEROL SULFATE (2.5 MG/3ML) 0.083% IN NEBU: 2.5000 mg | INHALATION_SOLUTION | RESPIRATORY_TRACT | Status: DC | PRN

## 2013-11-05 MED ORDER — FENTANYL CITRATE 0.05 MG/ML IJ SOLN
50.0000 ug | INTRAMUSCULAR | Status: DC | PRN
  Administered 2013-11-05: 50 ug via INTRAVENOUS
  Filled 2013-11-05: qty 2

## 2013-11-05 MED ORDER — POTASSIUM CHLORIDE CRYS ER 20 MEQ PO TBCR
40.0000 meq | EXTENDED_RELEASE_TABLET | Freq: Two times a day (BID) | ORAL | Status: AC
  Administered 2013-11-05 (×2): 40 meq via ORAL
  Filled 2013-11-05 (×2): qty 2

## 2013-11-05 MED ORDER — PREDNISOLONE ACETATE 1 % OP SUSP
1.0000 [drp] | Freq: Two times a day (BID) | OPHTHALMIC | Status: DC
  Administered 2013-11-05 – 2013-11-09 (×9): 1 [drp] via OPHTHALMIC
  Filled 2013-11-05: qty 1

## 2013-11-05 MED ORDER — ASPIRIN EC 325 MG PO TBEC
325.0000 mg | DELAYED_RELEASE_TABLET | Freq: Every day | ORAL | Status: DC
  Administered 2013-11-05 – 2013-11-09 (×5): 325 mg via ORAL
  Filled 2013-11-05 (×5): qty 1

## 2013-11-05 MED ORDER — SODIUM CHLORIDE 0.9 % IJ SOLN
3.0000 mL | Freq: Two times a day (BID) | INTRAMUSCULAR | Status: DC
  Administered 2013-11-05 – 2013-11-09 (×7): 3 mL via INTRAVENOUS

## 2013-11-05 MED ORDER — LISINOPRIL 2.5 MG PO TABS
2.5000 mg | ORAL_TABLET | Freq: Every day | ORAL | Status: DC
  Administered 2013-11-05 – 2013-11-09 (×5): 2.5 mg via ORAL
  Filled 2013-11-05 (×5): qty 1

## 2013-11-05 MED ORDER — AMLODIPINE BESYLATE 5 MG PO TABS
5.0000 mg | ORAL_TABLET | Freq: Every day | ORAL | Status: DC
  Administered 2013-11-05 – 2013-11-09 (×5): 5 mg via ORAL
  Filled 2013-11-05 (×5): qty 1

## 2013-11-05 MED ORDER — ACETAMINOPHEN 325 MG PO TABS
650.0000 mg | ORAL_TABLET | Freq: Four times a day (QID) | ORAL | Status: DC | PRN
  Administered 2013-11-05: 650 mg via ORAL
  Filled 2013-11-05: qty 2

## 2013-11-05 MED ORDER — ACETAMINOPHEN 650 MG RE SUPP
650.0000 mg | Freq: Four times a day (QID) | RECTAL | Status: DC | PRN
Start: 2013-11-05 — End: 2013-11-09

## 2013-11-05 MED ORDER — FUROSEMIDE 10 MG/ML IJ SOLN
15.0000 mg/h | INTRAVENOUS | Status: DC
  Administered 2013-11-05: 15 mg/h via INTRAVENOUS
  Administered 2013-11-05: 10 mg/h via INTRAVENOUS
  Administered 2013-11-06: 15 mg/h via INTRAVENOUS
  Filled 2013-11-05 (×6): qty 25

## 2013-11-05 MED ORDER — ONDANSETRON HCL 4 MG PO TABS: 4.0000 mg | ORAL_TABLET | Freq: Four times a day (QID) | ORAL | Status: DC | PRN

## 2013-11-05 MED ORDER — IPRATROPIUM BROMIDE 0.02 % IN SOLN: 0.5000 mg | Freq: Four times a day (QID) | RESPIRATORY_TRACT | Status: DC

## 2013-11-05 MED ORDER — SODIUM CHLORIDE 0.9 % IJ SOLN
3.0000 mL | Freq: Two times a day (BID) | INTRAMUSCULAR | Status: DC
  Administered 2013-11-05 – 2013-11-08 (×5): 3 mL via INTRAVENOUS

## 2013-11-05 MED ORDER — METOLAZONE 2.5 MG PO TABS
2.5000 mg | ORAL_TABLET | Freq: Two times a day (BID) | ORAL | Status: DC
  Administered 2013-11-05: 2.5 mg via ORAL
  Filled 2013-11-05 (×2): qty 1

## 2013-11-05 MED ORDER — LATANOPROST 0.005 % OP SOLN
1.0000 [drp] | Freq: Every day | OPHTHALMIC | Status: DC
  Administered 2013-11-05 – 2013-11-08 (×4): 1 [drp] via OPHTHALMIC
  Filled 2013-11-05: qty 2.5

## 2013-11-05 MED ORDER — HYDROCODONE-ACETAMINOPHEN 5-325 MG PO TABS
1.0000 | ORAL_TABLET | ORAL | Status: DC | PRN
  Administered 2013-11-05 – 2013-11-07 (×4): 1 via ORAL
  Filled 2013-11-05 (×4): qty 1

## 2013-11-05 MED ORDER — ENOXAPARIN SODIUM 40 MG/0.4ML ~~LOC~~ SOLN
40.0000 mg | SUBCUTANEOUS | Status: DC
  Administered 2013-11-05 – 2013-11-09 (×5): 40 mg via SUBCUTANEOUS
  Filled 2013-11-05 (×5): qty 0.4

## 2013-11-05 MED ORDER — HALOPERIDOL LACTATE 5 MG/ML IJ SOLN
2.5000 mg | Freq: Once | INTRAMUSCULAR | Status: AC
  Administered 2013-11-05: 2.5 mg via INTRAVENOUS
  Filled 2013-11-05: qty 1

## 2013-11-05 MED ORDER — IPRATROPIUM-ALBUTEROL 0.5-2.5 (3) MG/3ML IN SOLN
3.0000 mL | Freq: Three times a day (TID) | RESPIRATORY_TRACT | Status: DC
  Administered 2013-11-05 – 2013-11-08 (×9): 3 mL via RESPIRATORY_TRACT
  Filled 2013-11-05: qty 3
  Filled 2013-11-05: qty 39
  Filled 2013-11-05 (×9): qty 3

## 2013-11-05 MED ORDER — REPAGLINIDE 1 MG PO TABS
1.0000 mg | ORAL_TABLET | Freq: Two times a day (BID) | ORAL | Status: DC
  Administered 2013-11-05 – 2013-11-09 (×7): 1 mg via ORAL
  Filled 2013-11-05 (×17): qty 1

## 2013-11-05 MED ORDER — IPRATROPIUM-ALBUTEROL 0.5-2.5 (3) MG/3ML IN SOLN
3.0000 mL | Freq: Four times a day (QID) | RESPIRATORY_TRACT | Status: DC
  Administered 2013-11-05: 3 mL via RESPIRATORY_TRACT
  Filled 2013-11-05: qty 3

## 2013-11-05 MED ORDER — BENZONATATE 100 MG PO CAPS
200.0000 mg | ORAL_CAPSULE | Freq: Three times a day (TID) | ORAL | Status: DC | PRN
  Filled 2013-11-05: qty 2

## 2013-11-05 MED ORDER — INSULIN ASPART 100 UNIT/ML ~~LOC~~ SOLN
0.0000 [IU] | Freq: Three times a day (TID) | SUBCUTANEOUS | Status: DC
  Administered 2013-11-05 – 2013-11-07 (×4): 2 [IU] via SUBCUTANEOUS
  Administered 2013-11-07: 3 [IU] via SUBCUTANEOUS
  Administered 2013-11-08 (×3): 1 [IU] via SUBCUTANEOUS

## 2013-11-05 MED ORDER — FUROSEMIDE 10 MG/ML IJ SOLN
40.0000 mg | Freq: Three times a day (TID) | INTRAMUSCULAR | Status: DC
  Administered 2013-11-05: 40 mg via INTRAVENOUS
  Filled 2013-11-05 (×4): qty 4

## 2013-11-05 MED ORDER — ONDANSETRON HCL 4 MG/2ML IJ SOLN: 4.0000 mg | Freq: Four times a day (QID) | INTRAMUSCULAR | Status: DC | PRN

## 2013-11-05 MED ORDER — METOPROLOL SUCCINATE ER 25 MG PO TB24
25.0000 mg | ORAL_TABLET | Freq: Every day | ORAL | Status: DC
  Administered 2013-11-05 – 2013-11-09 (×5): 25 mg via ORAL
  Filled 2013-11-05 (×5): qty 1

## 2013-11-05 MED ORDER — METOLAZONE 2.5 MG PO TABS
2.5000 mg | ORAL_TABLET | Freq: Once | ORAL | Status: DC
  Filled 2013-11-05: qty 1

## 2013-11-05 MED ORDER — LINAGLIPTIN 5 MG PO TABS
5.0000 mg | ORAL_TABLET | Freq: Every day | ORAL | Status: DC
  Administered 2013-11-05 – 2013-11-09 (×5): 5 mg via ORAL
  Filled 2013-11-05 (×5): qty 1

## 2013-11-05 MED ORDER — FENOFIBRATE 160 MG PO TABS
160.0000 mg | ORAL_TABLET | Freq: Every day | ORAL | Status: DC
  Administered 2013-11-05 – 2013-11-09 (×5): 160 mg via ORAL
  Filled 2013-11-05 (×5): qty 1

## 2013-11-05 MED ORDER — IPRATROPIUM-ALBUTEROL 0.5-2.5 (3) MG/3ML IN SOLN: 3.0000 mL | RESPIRATORY_TRACT | Status: DC | PRN

## 2013-11-05 MED ORDER — ASPIRIN EC 81 MG PO TBEC: 81.0000 mg | DELAYED_RELEASE_TABLET | Freq: Every day | ORAL | Status: DC

## 2013-11-05 MED ORDER — PREDNISONE 10 MG PO TABS
10.0000 mg | ORAL_TABLET | Freq: Every day | ORAL | Status: DC
  Administered 2013-11-05 – 2013-11-09 (×5): 10 mg via ORAL
  Filled 2013-11-05 (×6): qty 1

## 2013-11-05 MED ORDER — METOLAZONE 2.5 MG PO TABS
2.5000 mg | ORAL_TABLET | Freq: Two times a day (BID) | ORAL | Status: AC
  Administered 2013-11-05: 2.5 mg via ORAL
  Filled 2013-11-05: qty 1
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
## 2013-11-05 NOTE — Progress Notes (Signed)
Utilization Review Completed Naser Schuld J. Rada Zegers, RN, BSN, NCM [phone]  

## 2013-11-05 NOTE — Progress Notes (Signed)
[facility] PROGRESS NOTE Interim History: 80 y.o. male history of CHF, chronic kidney disease, diabetes mellitus, hypertension was brought to the ER because of increasing lower extremity edema and pain. Patient also has been having shortness of breath on exertion over the last few months and was admitted last October for CHF and at that time patient had stress Myoview which was showing low risk and 2-D echo was showing EF of 55% with severe pulmonary hypertension. On my exam denies any chest pain and states that he's been having some cough for last 3 weeks which was productive of sputum.  Filed Weights   11/04/13 1923 11/04/13 2308 11/05/13 0620  Weight: 98.431 kg (217 lb) 114.715 kg (252 lb 14.4 oz) 114.306 kg (252 lb)        Intake/Output Summary (Last 24 hours) at 11/05/13 0948 Last data filed at 11/05/13 4970  Gross per 24 hour  Intake    240 ml  Output   1225 ml  Net   -985 ml     Assessment/Plan: Acute on chronic diastolic CHF (congestive heart failure)/Lower extremity edema: - 2-D echo in October 2014 which showed EF of 55% with severe pulmonary hypertension. - Change to lasix drip add metolazone, keep leg elevated above heart. If tolerate it. - Low sodium diet fluid restricted. - monitor electrolytes, replete. - condom foley. Echo pending.  Type II or unspecified type diabetes mellitus without mention of complication, uncontrolled - start low dose Lantus.  HTN (hypertension): - Due to non compliance. - cont metoprolol, lasix and ACE-I - Bp high due tol massive vol overload.  Acute on CKD (chronic kidney disease), stage III - baseline Cr 1.31.5. - most likely due to HF. - strict I and O's.    Code Status: full code.  Family Communication: patient's niece.  Disposition Plan: admit to inpatient.     Consultants:  none  Procedures: ECHO: pending  Antibiotics:  None  HPI/Subjective: SOB with exertion, orthopnea  Objective: Filed Vitals:   11/04/13 2308 11/05/13 0252 11/05/13 0620 11/05/13 0904  BP: 147/67  145/72   Pulse: 87  91   Temp: 99.1 F (37.3 C)  99.4 F (37.4 C)   TempSrc: Oral  Oral   Resp: 21  20   Height: 5\' 9"  (1.753 m)     Weight: 114.715 kg (252 lb 14.4 oz)  114.306 kg (252 lb)   SpO2: 96% 98% 96% 96%     Exam:  General: Alert, awake, oriented x3, in no acute distress.  HEENT: No bruits, no goiter. + JVD Heart: Regular rate and rhythm, anasarca Lungs: Good air movement, crackle B/L Abdomen: Soft, nontender, nondistended, positive bowel sounds.    Data Reviewed: Basic Metabolic Panel:  Recent Labs Lab 11/01/13 1645 11/04/13 1922 11/05/13 0325  NA 137 136* 137  K 3.9 5.0 3.6*  CL 99 97 97  CO2 29 26 25   GLUCOSE 95 117* 102*  BUN 28* 41* 41*  CREATININE 1.8* 1.86* 1.88*  CALCIUM 9.1 9.3 8.8   Liver Function Tests:  Recent Labs Lab 11/04/13 1922 11/05/13 0325  AST 75* 57*  ALT 37 33  ALKPHOS 39 36*  BILITOT 1.0 1.2  PROT 7.0 6.3  ALBUMIN 3.3* 3.1*   No results found for this basename: LIPASE, AMYLASE,  in the last 168 hours No results found for this basename: AMMONIA,  in the last 168 hours CBC:  Recent Labs Lab 11/04/13 1922 11/05/13 0325  WBC 7.4 7.2  NEUTROABS 4.3 4.5  HGB 13.1 11.8*  HCT 38.7* 34.9*  MCV 97.0 95.1  PLT 207 206   Cardiac Enzymes:  Recent Labs Lab 11/01/13 1645 11/04/13 1922 11/05/13 0325  CKTOTAL 261*  --   --   CKMB 4.3  --   --   TROPONINI  --  <0.30 <0.30   BNP (last 3 results)  Recent Labs  07/07/13 1949 09/19/13 2117 11/04/13 1922  PROBNP 6822.0* 6854.0* 8116.0*   CBG:  Recent Labs Lab 11/05/13 0001 11/05/13 0549 11/05/13 0632  GLUCAP 105* 109* 136*    No results found for this or any previous visit (from the past 240 hour(s)).   Studies: Dg Chest Portable 1 View  11/04/2013   CLINICAL DATA:  Leg pain, shortness of Breath  EXAM: PORTABLE CHEST - 1 VIEW  COMPARISON:  09/19/2013  FINDINGS: Cardiomegaly again noted.  Central mild vascular congestion without convincing pulmonary edema. There is moderate right pleural effusion with right lower lobe atelectasis or infiltrate.  IMPRESSION: Cardiomegaly. Moderate right pleural effusion with right lower lobe atelectasis or infiltrate.   Electronically Signed   By: Lahoma Crocker M.D.   On: 11/04/2013 19:57    Scheduled Meds: . amLODipine  5 mg Oral Daily  . aspirin EC  325 mg Oral Daily  . enoxaparin (LOVENOX) injection  40 mg Subcutaneous Q24H  . fenofibrate  160 mg Oral Daily  . insulin aspart  0-9 Units Subcutaneous TID WC  . ipratropium-albuterol  3 mL Nebulization TID  . latanoprost  1 drop Both Eyes QHS  . [START ON 11/06/2013] levofloxacin (LEVAQUIN) IV  750 mg Intravenous Q48H  . linagliptin  5 mg Oral Daily  . lisinopril  2.5 mg Oral Daily  . metolazone  2.5 mg Oral Once  . metoprolol succinate  25 mg Oral Daily  . potassium chloride  40 mEq Oral BID  . prednisoLONE acetate  1 drop Both Eyes BID  . predniSONE  10 mg Oral Q breakfast  . repaglinide  1 mg Oral BID AC  . sodium chloride  3 mL Intravenous Q12H  . sodium chloride  3 mL Intravenous Q12H   Continuous Infusions: . furosemide (LASIX) infusion       Charlynne Cousins  [facility] Pager [phone] If 8PM-8AM, please contact night-coverage at [email or web address], password [facility] 11/05/2013, 9:48 AM  LOS: 1 day

## 2013-11-05 NOTE — Plan of Care (Signed)
Problem: Phase I Progression Outcomes Goal: Up in chair, BRP Outcome: Completed/Met Date Met:  11/05/13 Pt OOB with assist x 1 and cane.

## 2013-11-05 NOTE — Care Management Note (Addendum)
    Page 1 of 2   11/08/2013     11:04:43 AM   CARE MANAGEMENT NOTE 11/08/2013  Patient:  [name] [name], [name] [name]   Account Number:  [id number]  Date Initiated:  11/05/2013  Documentation initiated by:  [facility]  Subjective/Objective Assessment:   80 y.o. male history of CHF, chronic kidney disease, DM, hypertension was brought to the ER because of increasing lower extremity edema and pain.//Home with blind son     Action/Plan:   Lasix 40 mg IV every 8 hourly and closely follow intake output and metabolic panel.//Access for [facility] needs   Anticipated DC Date:  11/08/2013   Anticipated DC Plan:  [place]  CM consult      [facility] Choice  HOME HEALTH   Choice offered to / List presented to:  C-1 Patient        [place] arranged  HH-1 RN  [place].   Status of service:  Completed, signed off Medicare Important Message given?   (If response is "NO", the following Medicare IM given date fields will be blank) Date Medicare IM given:   Date Additional Medicare IM given:    Discharge Disposition:    Per UR Regulation:    If discussed at Long Length of Stay Meetings, dates discussed:    Comments:  11/08/13 [place], RN, BSN, [employer] [phone] Spoke with pt at bedside regarding discharge planning for [facility]. Offered pt list of home health agencies to choose from.  Pt chose [facility] to render services. Janae Sauce, RN of [facility] notified.  No DME needs identified at this time.  11/06/2013 Social:  From Home with Blind son PT RECS:  ______pending Dispostion Plan: Pending - Remains on Lasix gtt Crystal Hutchinson RN, BSN, [place], CCM 11/06/2013

## 2013-11-05 NOTE — Progress Notes (Signed)
I cosign with Demarcus Steward on all assessments, documentation and medicine administration for this shift. Nya Monds A, RN  

## 2013-11-05 NOTE — Plan of Care (Signed)
Problem: Phase I Progression Outcomes Goal: EF % per last Echo/documented,Core Reminder form on chart Outcome: Completed/Met Date Met:  11/05/13 EF 50-55% per echo on 03/25/13

## 2013-11-05 NOTE — H&P (Signed)
[facility] History and Physical  [name] [name] WUJ:[id number] DOB: [birth year] DOA: 11/04/2013  Referring physician: ER physician. PCP: [name], MD  Specialists: Dr. Brand Males.  Chief Complaint: Lower extremity edema and pain.  HPI: [name] [name] is a 80 y.o. male history of CHF, chronic kidney disease, diabetes mellitus, hypertension was brought to the ER because of increasing lower extremity edema and pain. Patient is a poor historian and most of the history is obtained from previous charts ER physician and patient's niece. Patient also has been having shortness of breath on exertion over the last few months and was admitted last October for CHF and at that time patient had stress Myoview which was showing low risk and 2-D echo was showing EF of 55% with severe pulmonary hypertension. Patient eventually has been following up with pulmonologist for shortness of breath and CAT scan done in December were showing possible interstitial lung disease and a repeat CAT scan has been ordered by his pulmonologist. Patient has been recently placed on prednisone. Patient's on my exam denies any chest pain and states that he's been having some cough for last 3 weeks which was productive of sputum. His pain in his lower extremities has recently worsened with increasing swelling. It is not sure if patient has been taking his diuretics as advised. X-ray shows moderate right-sided pleural effusion with possible atelectasis versus infiltrates. Patient in the ER has been started on Lasix 40 mg IV and admitted for CHF exacerbation.  Patient otherwise denies any nausea vomiting abdominal pain diarrhea or any focal deficits.  Review of Systems: As presented in the history of presenting illness, rest negative.  Past Medical History  Diagnosis Date  . Hypertension   . Shortness of breath     with exertion  . Diabetes mellitus     does not check his blood sugar at home  . Arthritis   . CHF  (congestive heart failure)   . COPD (chronic obstructive pulmonary disease)   . Hyperlipidemia   . Chronic kidney disease     renal insuficiency   Past Surgical History  Procedure Laterality Date  . Leg surgery      both legs - unsure of exact operations - from trauma  . Tonsillectomy    . Appendectomy    . Cataract extraction w/phaco Left 09/04/2013    Procedure: CATARACT EXTRACTION PHACO AND INTRAOCULAR LENS PLACEMENT (IOC);  Surgeon: Adonis Brook, MD;  Location: [place];  Service: Ophthalmology;  Laterality: Left;   Social History:  reports that he quit smoking about 4 years ago. His smoking use included Cigarettes. He has a 35 pack-year smoking history. He has never used smokeless tobacco. He reports that he does not drink alcohol or use illicit drugs. Where does patient live home.                      Can patient participate in ADLs?  not sure.   No Known Allergies  Family History:  Family History  Problem Relation Age of Onset  . Diabetes Father   . Diabetes Mother       Prior to Admission medications   Medication Sig Start Date End Date Taking? Authorizing Provider  albuterol (PROVENTIL) (2.5 MG/3ML) 0.083% nebulizer solution Take 3 mLs (2.5 mg total) by nebulization daily. 09/19/13   Deno Etienne, MD  amLODipine (NORVASC) 5 MG tablet Take 5 mg by mouth daily.    Historical Provider, MD  aspirin EC 81 MG tablet Take 81  mg by mouth daily.    Historical Provider, MD  benzonatate (TESSALON) 200 MG capsule Take 200 mg by mouth 3 (three) times daily as needed for cough.    Historical Provider, MD  fenofibrate 160 MG tablet Take 160 mg by mouth daily.    Historical Provider, MD  furosemide (LASIX) 40 MG tablet Take 40 mg by mouth daily. 03/28/13   Erline Hau, MD  linagliptin (TRADJENTA) 5 MG TABS tablet Take 5 mg by mouth daily. 01/20/13   Verlee Monte, MD  lisinopril (ZESTRIL) 2.5 MG tablet Take 1 tablet (2.5 mg total) by mouth daily. 03/28/13   Erline Hau, MD   metoprolol succinate (TOPROL-XL) 25 MG 24 hr tablet Take 25 mg by mouth daily. 07/04/13   Historical Provider, MD  prednisoLONE acetate (PRED FORTE) 1 % ophthalmic suspension Place 1 drop into both eyes 2 (two) times daily. 09/11/13   Historical Provider, MD  predniSONE (DELTASONE) 10 MG tablet Take 4 tabs daily x 1 days, 3 tabs daily x 1 days, 2 tabs daily x 1 days, 1 tab daily x 1 days then 1/2 tab x 1 day, then stop 11/01/13   Brand Males, MD  repaglinide (PRANDIN) 0.5 MG tablet Take 1 mg by mouth 2 (two) times daily before a meal.  07/04/13   Historical Provider, MD  torsemide (DEMADEX) 20 MG tablet Take 1 tablet (20 mg total) by mouth 2 (two) times daily. 11/01/13   Brand Males, MD  TRAVATAN Z 0.004 % SOLN ophthalmic solution Place 1 drop into both eyes daily. 07/04/13   Historical Provider, MD    Physical Exam: Filed Vitals:   11/04/13 1926 11/04/13 2000 11/04/13 2133 11/04/13 2308  BP:  115/90 126/87 147/67  Pulse:  120  87  Temp: 99.6 F (37.6 C)   99.1 F (37.3 C)  TempSrc: Rectal   Oral  Resp:  32 18 21  Height:    5\' 9"  (1.753 m)  Weight:    114.715 kg (252 lb 14.4 oz)  SpO2:  100% 99% 96%     General:  Well-developed and nourished.   Eyes: Anicteric no pallor.   ENT:  no discharge from the ears eyes nose mouth.   Neck: No mass felt. JVD not appreciated.   Cardiovascular: S1-S2 heard.   Respiratory:  no rhonchi or crepitations.  Abdomen:  soft nontender bowel sounds present. No guarding or rigidity.   Skin: Patient has multiple blisters on the lower extremities some of which have ruptured.  Musculoskeletal: Bilateral lower extremity edema extending to the thigh. There is no signs of any rescue ischemia in the lower extremities.   Psychiatric: Appears normal.   Neurologic:  alert awake oriented to time place and person. Moves all extremities.  Labs on Admission:  Basic Metabolic Panel:  Recent Labs Lab 11/01/13 1645 11/04/13 1922  NA 137 136*   K 3.9 5.0  CL 99 97  CO2 29 26  GLUCOSE 95 117*  BUN 28* 41*  CREATININE 1.8* 1.86*  CALCIUM 9.1 9.3   Liver Function Tests:  Recent Labs Lab 11/04/13 1922  AST 75*  ALT 37  ALKPHOS 39  BILITOT 1.0  PROT 7.0  ALBUMIN 3.3*   No results found for this basename: LIPASE, AMYLASE,  in the last 168 hours No results found for this basename: AMMONIA,  in the last 168 hours CBC:  Recent Labs Lab 11/04/13 1922  WBC 7.4  NEUTROABS 4.3  HGB 13.1  HCT 38.7*  MCV 97.0  PLT 207   Cardiac Enzymes:  Recent Labs Lab 11/01/13 1645 11/04/13 1922  CKTOTAL 261*  --   CKMB 4.3  --   TROPONINI  --  <0.30    BNP (last 3 results)  Recent Labs  07/07/13 1949 09/19/13 2117 11/04/13 1922  PROBNP 6822.0* 6854.0* 8116.0*   CBG:  Recent Labs Lab 11/05/13 0001  GLUCAP 105*    Radiological Exams on Admission: Dg Chest Portable 1 View  11/04/2013   CLINICAL DATA:  Leg pain, shortness of Breath  EXAM: PORTABLE CHEST - 1 VIEW  COMPARISON:  09/19/2013  FINDINGS: Cardiomegaly again noted. Central mild vascular congestion without convincing pulmonary edema. There is moderate right pleural effusion with right lower lobe atelectasis or infiltrate.  IMPRESSION: Cardiomegaly. Moderate right pleural effusion with right lower lobe atelectasis or infiltrate.   Electronically Signed   By: Lahoma Crocker M.D.   On: 11/04/2013 19:57    EKG: Independently reviewed. Normal sinus rhythm with nonspecific T-wave changes.   Assessment/Plan Principal Problem:   Acute on chronic diastolic CHF (congestive heart failure) Active Problems:   CKD (chronic kidney disease), stage III   HTN (hypertension)   Type II or unspecified type diabetes mellitus without mention of complication, uncontrolled   Lower extremity edema   CHF (congestive heart failure)   1. Acute on chronic diastolic heart failure - continue with Lasix 40 mg IV every 8 hourly and closely follow intake output and metabolic panel. Patient  does have chronic kidney disease. Patient has had 2-D echo in October 2014 which showed EF of 55% with severe pulmonary hypertension. Patient's stress Myoview showed an EF of 40%. May repeat chest x-ray after diuresis to see improvement. If there is no improvement then may need pulmonary consult for thoracentesis for the pleural effusion. 2. Lower extremity edema with blisters and skin excoriations  - I have consulted wound team. I think patient's skin changes are from edema. For now I have placed patient on antibiotics since there is a concern for pneumonia and also patient is mildly febrile and also patient's lowest weight has mild redness. Check ABI and Dopplers of the lower extremity.  3. Diabetes mellitus type 2 - patient has been placed on sliding-scale coverage. 4. Hypertension - continue home medications. Patient presently is on Lasix and closely follow metabolic panel. 5. Possible interstitial lung disease and pulmonary nodule - being followed by pulmonologist. Patient has been placed on prednisone tapering dose by pulmonologist. Placed a per patient's prednisone dose. 6. Chronic kidney disease - see #1.  Have reviewed patient's old labs and charts.  Code Status:  full code.   Family Communication:  patient's niece.  Disposition Plan:  admit to inpatient.     Ramsha Lonigro N. [facility] Pager [phone].   If 7PM-7AM, please contact night-coverage [email or web address] Password [email or web address] 11/05/2013, 1:24 AM

## 2013-11-05 NOTE — Progress Notes (Signed)
Pt alert intermittently confused, pt c/o pain PRN Norco given as ordered, pt started on lasix gtt @ 60ml/hr, pt pulled out IV, awaiting IV access to resume gtt, pt stable

## 2013-11-05 NOTE — Progress Notes (Signed)
*  PRELIMINARY RESULTS* Vascular Ultrasound Lower extremity venous duplex bilateral has been completed.  Preliminary findings: no obvious evidence of DVT.  ABI completed: Consistent with mild arterial insufficiency.     RIGHT    LEFT    PRESSURE WAVEFORM  PRESSURE WAVEFORM  BRACHIAL 160 Tri BRACHIAL 162 Tri  DP   DP    AT 113 Bi AT 130 Mono  PT 121 Bi PT 131 Bi  PER   PER    GREAT TOE  NA GREAT TOE  NA    RIGHT LEFT  ABI 0.75 0.81    Landry Mellow, RDMS, RVT  11/05/2013, 4:20 PM

## 2013-11-06 LAB — GLUCOSE, CAPILLARY
GLUCOSE-CAPILLARY: 176 mg/dL — AB (ref 70–99)
GLUCOSE-CAPILLARY: 161 mg/dL — AB (ref 70–99)
GLUCOSE-CAPILLARY: 165 mg/dL — AB (ref 70–99)
Glucose-Capillary: 118 mg/dL — ABNORMAL HIGH (ref 70–99)

## 2013-11-06 MED ORDER — DIPHENHYDRAMINE HCL 25 MG PO CAPS
25.0000 mg | ORAL_CAPSULE | Freq: Four times a day (QID) | ORAL | Status: DC | PRN
  Administered 2013-11-06 – 2013-11-07 (×3): 25 mg via ORAL
  Filled 2013-11-06 (×3): qty 1

## 2013-11-06 NOTE — Progress Notes (Signed)
[facility]                                                                              Patient Demographics  [name] [name], is a 80 y.o. male, DOB - 19-May-1931, WUJ:[id number]  Admit date - 11/04/2013   Admitting Physician Rise Patience, MD  Outpatient Primary MD for the patient is [name], MD  LOS - 2   Chief Complaint  Patient presents with  . Leg Pain        Assessment & Plan   Acute on chronic diastolic CHF (congestive heart failure)/Lower extremity edema:  -Echo in October 2014 which showed EF of 55% with severe pulmonary hypertension.  -Continue lasix drip, and metolazone, low sodium diet with fluid restriction, daily weights, Strict I's and O's, elevate lower extremities if patient can tolerate  CAP -CXR shows RLL infiltrate or atelectasis -Continue levaquin -Patient currently afebrile with no leukocytosis  Type II or unspecified type diabetes mellitus without mention of complication, uncontrolled  -Continue tradjenta, repaglinide, ISS and CBG monitoring  HTN (hypertension):  - Due to non compliance.  - continue metoprolol, amlodipine, lasix, lisinopril - BP high due to massive volume overload.   Acute on CKD (chronic kidney disease), stage III  - Cr 1.88, baseline Cr 1.3-1.5.  - most likely due to HF.  - strict I and O's.   Lower extremity wounds -LE doppler showed no DVT -Would care consuted   Code Status: Full  Family Communication: None at bedside.  Disposition Plan: Admitted  Time Spent in minutes   35 minutes  Procedures  Lower extremity venous duplex: No obvious evidence of DVT ABI: Right 0.75, left 0.81  Consults  None  DVT Prophylaxis  Lovenox  Lab Results  Component Value Date   PLT 206 11/05/2013    Medications  Scheduled Meds: . amLODipine  5 mg Oral Daily  . aspirin EC  325 mg Oral Daily  . enoxaparin (LOVENOX) injection  40 mg Subcutaneous Q24H  . fenofibrate  160 mg Oral Daily  . insulin aspart   0-9 Units Subcutaneous TID WC  . ipratropium-albuterol  3 mL Nebulization TID  . latanoprost  1 drop Both Eyes QHS  . levofloxacin (LEVAQUIN) IV  750 mg Intravenous Q48H  . linagliptin  5 mg Oral Daily  . lisinopril  2.5 mg Oral Daily  . metoprolol succinate  25 mg Oral Daily  . prednisoLONE acetate  1 drop Both Eyes BID  . predniSONE  10 mg Oral Q breakfast  . repaglinide  1 mg Oral BID AC  . sodium chloride  3 mL Intravenous Q12H  . sodium chloride  3 mL Intravenous Q12H   Continuous Infusions: . furosemide (LASIX) infusion 15 mg/hr (11/06/13 0817)   PRN Meds:.acetaminophen, acetaminophen, benzonatate, HYDROcodone-acetaminophen, ipratropium-albuterol, ondansetron (ZOFRAN) IV, ondansetron  Antibiotics   Anti-infectives   Start     Dose/Rate Route Frequency Ordered Stop   11/06/13 2200  levofloxacin (LEVAQUIN) IVPB 750 mg    Comments:  Levaquin 750 mg IV q48h for CrCl < 50 mL/min   750 mg 100 mL/hr over 90 Minutes Intravenous Every 48 hours 11/04/13 2303     11/04/13 2359  levofloxacin (  LEVAQUIN) IVPB 750 mg     750 mg 100 mL/hr over 90 Minutes Intravenous  Once 11/04/13 2303 11/05/13 0134        Subjective:   [name] seen and examined today.  Patient has no complaints today.  Refused to turn over.  Currently supine.   Objective:   Filed Vitals:   11/05/13 1450 11/05/13 2122 11/05/13 2144 11/06/13 0500  BP: 161/61 148/76    Pulse: 87 87    Temp: 98.9 F (37.2 C) 98.3 F (36.8 C)    TempSrc: Oral Oral    Resp: 20 20    Height:      Weight:    110.814 kg (244 lb 4.8 oz)  SpO2: 99% 100% 100%     Wt Readings from Last 3 Encounters:  11/06/13 110.814 kg (244 lb 4.8 oz)  11/01/13 117.119 kg (258 lb 3.2 oz)  09/03/13 107.502 kg (237 lb)     Intake/Output Summary (Last 24 hours) at 11/06/13 0258 Last data filed at 11/06/13 5277  Gross per 24 hour  Intake    723 ml  Output   3000 ml  Net  -2277 ml    Exam  General: Well developed, well nourished,  NAD, appears stated age  48: Unable to examine.  Neck: Unable to examine  Cardiovascular: S1 S2 auscultated, regular rate and rhythm.  Respiratory: Clear to auscultation bilaterally with equal chest rise  Abdomen: Unable to examine, patient currently supine  Extremities: warm dry without cyanosis clubbing or edema, lower ext wrapped  Neuro: Unable to fully examine   Skin: Without rashes exudates or nodules  Data Review   Micro Results No results found for this or any previous visit (from the past 240 hour(s)).  Radiology Reports Dg Chest Portable 1 View  11/04/2013   CLINICAL DATA:  Leg pain, shortness of Breath  EXAM: PORTABLE CHEST - 1 VIEW  COMPARISON:  09/19/2013  FINDINGS: Cardiomegaly again noted. Central mild vascular congestion without convincing pulmonary edema. There is moderate right pleural effusion with right lower lobe atelectasis or infiltrate.  IMPRESSION: Cardiomegaly. Moderate right pleural effusion with right lower lobe atelectasis or infiltrate.   Electronically Signed   By: Lahoma Crocker M.D.   On: 11/04/2013 19:57    CBC  Recent Labs Lab 11/04/13 1922 11/05/13 0325  WBC 7.4 7.2  HGB 13.1 11.8*  HCT 38.7* 34.9*  PLT 207 206  MCV 97.0 95.1  MCH 32.8 32.2  MCHC 33.9 33.8  RDW 14.7 14.5  LYMPHSABS 1.7 1.5  MONOABS 1.0 1.0  EOSABS 0.3 0.2  BASOSABS 0.0 0.0    Chemistries   Recent Labs Lab 11/01/13 1645 11/04/13 1922 11/05/13 0325  NA 137 136* 137  K 3.9 5.0 3.6*  CL 99 97 97  CO2 29 26 25   GLUCOSE 95 117* 102*  BUN 28* 41* 41*  CREATININE 1.8* 1.86* 1.88*  CALCIUM 9.1 9.3 8.8  AST  --  75* 57*  ALT  --  37 33  ALKPHOS  --  39 36*  BILITOT  --  1.0 1.2   ------------------------------------------------------------------------------------------------------------------ estimated creatinine clearance is 38.4 ml/min (by C-G formula based on Cr of  1.88). ------------------------------------------------------------------------------------------------------------------ No results found for this basename: HGBA1C,  in the last 72 hours ------------------------------------------------------------------------------------------------------------------ No results found for this basename: CHOL, HDL, LDLCALC, TRIG, CHOLHDL, LDLDIRECT,  in the last 72 hours ------------------------------------------------------------------------------------------------------------------  Recent Labs  11/05/13 0325  TSH 2.897   ------------------------------------------------------------------------------------------------------------------ No results found for this basename: VITAMINB12,  FOLATE, FERRITIN, TIBC, IRON, RETICCTPCT,  in the last 72 hours  Coagulation profile No results found for this basename: INR, PROTIME,  in the last 168 hours  No results found for this basename: DDIMER,  in the last 72 hours  Cardiac Enzymes  Recent Labs Lab 11/01/13 1645 11/04/13 1922 11/05/13 0325  CKMB 4.3  --   --   TROPONINI  --  <0.30 <0.30   ------------------------------------------------------------------------------------------------------------------ No components found with this basename: POCBNP,     Brielyn Bosak D.O. on 11/06/2013 at 8:52 AM  Between 7am to 7pm - Pager - [phone]  After 7pm go to [email or web address] - password [email or web address]  And look for the night coverage person covering for me after hours  [facility] Office  [phone]

## 2013-11-06 NOTE — Progress Notes (Signed)
Patient continued to pull off tele leads but was able to maintain peripheral IV throughout the night. Notified attending doctor. Orders given to discontinue cardiac monitoring. Will notify oncoming nurse. Nurse signing off at this time.

## 2013-11-06 NOTE — Progress Notes (Signed)
At beginning of the shift right at the end of shift report, was called to room for patient pulled out IV and blood was everywhere. Housekeeping was called. Patient also took off tele box and nasal cannula off.  Notified on-call hospitalist, orders given for sitter, one time dose haldol and posey belt. Those orders were carried out by nurse. Haldol not effective. Posey belt is effective to some degree but patient is able to still get out of bed. Peripheral IV was reinserted by nurse but patient pulled that out. IV team was called and they were successful with inserting a new IV. New IV wrapped in kerlex to help protect and keep IV much like the last ones. Will continue to monitor to end of shift.
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
## 2013-11-07 LAB — BASIC METABOLIC PANEL
BUN: 45 mg/dL — ABNORMAL HIGH (ref 6–23)
CHLORIDE: 93 meq/L — AB (ref 96–112)
CO2: 28 meq/L (ref 19–32)
Calcium: 9.3 mg/dL (ref 8.4–10.5)
Creatinine, Ser: 2.06 mg/dL — ABNORMAL HIGH (ref 0.50–1.35)
GFR calc Af Amer: 33 mL/min — ABNORMAL LOW (ref >90)
GFR calc non Af Amer: 29 mL/min — ABNORMAL LOW (ref >90)
Glucose, Bld: 90 mg/dL (ref 70–99)
Potassium: 3.8 mEq/L (ref 3.7–5.3)
SODIUM: 135 meq/L — AB (ref 137–147)

## 2013-11-07 LAB — CBC
HCT: 37.3 % — ABNORMAL LOW (ref 39.0–52.0)
Hemoglobin: 12.8 g/dL — ABNORMAL LOW (ref 13.0–17.0)
MCH: 32.6 pg (ref 26.0–34.0)
MCHC: 34.3 g/dL (ref 30.0–36.0)
MCV: 94.9 fL (ref 78.0–100.0)
PLATELETS: 207 10*3/uL (ref 150–400)
RBC: 3.93 MIL/uL — AB (ref 4.22–5.81)
RDW: 14.6 % (ref 11.5–15.5)
WBC: 9.4 10*3/uL (ref 4.0–10.5)

## 2013-11-07 LAB — GLUCOSE, CAPILLARY
GLUCOSE-CAPILLARY: 88 mg/dL (ref 70–99)
Glucose-Capillary: 185 mg/dL — ABNORMAL HIGH (ref 70–99)
Glucose-Capillary: 202 mg/dL — ABNORMAL HIGH (ref 70–99)
Glucose-Capillary: 159 mg/dL — ABNORMAL HIGH (ref 70–99)

## 2013-11-07 NOTE — Progress Notes (Signed)
[facility]                                                                              Patient Demographics  [name] [name], is a 80 y.o. male, DOB - 1930/11/02, ERD:[id number]  Admit date - 11/04/2013   Admitting Physician Rise Patience, MD  Outpatient Primary MD for the patient is [name], MD  LOS - 3   Chief Complaint  Patient presents with  . Leg Pain        Assessment & Plan   Acute on chronic diastolic CHF (congestive heart failure)/Lower extremity edema:  -Echo in October 2014 which showed EF of 55% with severe pulmonary hypertension.  -Continue low sodium diet with fluid restriction, daily weights, Strict I's and O's, elevate lower extremities if patient can tolerate -Will discontinue lasix drip -Patient has had ~6L of output in the last 24 hours  CAP -CXR shows RLL infiltrate or atelectasis -Continue levaquin -Patient currently afebrile with no leukocytosis  Type II or unspecified type diabetes mellitus without mention of complication, uncontrolled  -Continue tradjenta, repaglinide, ISS and CBG monitoring  HTN (hypertension):  - Due to non compliance.  - continue metoprolol, amlodipine, lisinopril - BP high due to massive volume overload.   Acute on CKD (chronic kidney disease), stage III  - Cr 2.06, baseline Cr 1.3-1.5.   - likely due to lasix drip, will discontinue and monitor  Lower extremity wounds -LE doppler showed no DVT -Would care consuted   Code Status: Full  Family Communication: None at bedside.  Disposition Plan: Admitted  Time Spent in minutes   25 minutes  Procedures  Lower extremity venous duplex: No obvious evidence of DVT ABI: Right 0.75, left 0.81  Consults  None  DVT Prophylaxis  Lovenox  Lab Results  Component Value Date   PLT 207 11/07/2013    Medications  Scheduled Meds: . amLODipine  5 mg Oral Daily  . aspirin EC  325 mg Oral Daily  . enoxaparin (LOVENOX) injection  40 mg Subcutaneous  Q24H  . fenofibrate  160 mg Oral Daily  . insulin aspart  0-9 Units Subcutaneous TID WC  . ipratropium-albuterol  3 mL Nebulization TID  . latanoprost  1 drop Both Eyes QHS  . levofloxacin (LEVAQUIN) IV  750 mg Intravenous Q48H  . linagliptin  5 mg Oral Daily  . lisinopril  2.5 mg Oral Daily  . metoprolol succinate  25 mg Oral Daily  . prednisoLONE acetate  1 drop Both Eyes BID  . predniSONE  10 mg Oral Q breakfast  . repaglinide  1 mg Oral BID AC  . sodium chloride  3 mL Intravenous Q12H  . sodium chloride  3 mL Intravenous Q12H   Continuous Infusions: . furosemide (LASIX) infusion 15 mg/hr (11/06/13 0817)   PRN Meds:.acetaminophen, acetaminophen, benzonatate, diphenhydrAMINE, HYDROcodone-acetaminophen, ipratropium-albuterol, ondansetron (ZOFRAN) IV, ondansetron  Antibiotics   Anti-infectives   Start     Dose/Rate Route Frequency Ordered Stop   11/06/13 2200  levofloxacin (LEVAQUIN) IVPB 750 mg    Comments:  Levaquin 750 mg IV q48h for CrCl < 50 mL/min   750 mg 100 mL/hr over 90 Minutes Intravenous Every 48 hours  11/04/13 2303     11/04/13 2359  levofloxacin (LEVAQUIN) IVPB 750 mg     750 mg 100 mL/hr over 90 Minutes Intravenous  Once 11/04/13 2303 11/05/13 0134        Subjective:   [name] [name] seen and examined today.  Patient has no complaints today.  He states he is feeling "good" and his breathing has improved.   Objective:   Filed Vitals:   11/06/13 1425 11/06/13 2055 11/07/13 0100 11/07/13 0453  BP:  146/70 135/69 126/69  Pulse: 87 88 86 85  Temp:  98 F (36.7 C) 98 F (36.7 C) 97.5 F (36.4 C)  TempSrc:  Oral Axillary Oral  Resp: 20 18 18 18   Height:      Weight:    106.686 kg (235 lb 3.2 oz)  SpO2: 96% 98% 99% 99%    Wt Readings from Last 3 Encounters:  11/07/13 106.686 kg (235 lb 3.2 oz)  11/01/13 117.119 kg (258 lb 3.2 oz)  09/03/13 107.502 kg (237 lb)     Intake/Output Summary (Last 24 hours) at 11/07/13 0824 Last data filed at 11/07/13  0617  Gross per 24 hour  Intake 1589.08 ml  Output   7250 ml  Net -5660.92 ml    Exam  General: Well developed, well nourished, NAD, appears stated age  HEENT: NCAT, PERRLA, Anicteric sclera, moist membranes  Neck: Supple, no masses  Cardiovascular: S1 S2 auscultated, regular rate and rhythm.  Respiratory: Clear to auscultation bilaterally with equal chest rise  Abdomen: Soft, nontender, nondistended, +bowel sounds  Extremities: warm dry without cyanosis clubbing or edema, lower ext wrapped  Neuro: AAOx3, CN 2-12 intact, no sensory or motor deficits  Skin: Without rashes exudates or nodules  Data Review   Micro Results No results found for this or any previous visit (from the past 240 hour(s)).  Radiology Reports Dg Chest Portable 1 View  11/04/2013   CLINICAL DATA:  Leg pain, shortness of Breath  EXAM: PORTABLE CHEST - 1 VIEW  COMPARISON:  09/19/2013  FINDINGS: Cardiomegaly again noted. Central mild vascular congestion without convincing pulmonary edema. There is moderate right pleural effusion with right lower lobe atelectasis or infiltrate.  IMPRESSION: Cardiomegaly. Moderate right pleural effusion with right lower lobe atelectasis or infiltrate.   Electronically Signed   By: Lahoma Crocker M.D.   On: 11/04/2013 19:57    CBC  Recent Labs Lab 11/04/13 1922 11/05/13 0325 11/07/13 0600  WBC 7.4 7.2 9.4  HGB 13.1 11.8* 12.8*  HCT 38.7* 34.9* 37.3*  PLT 207 206 207  MCV 97.0 95.1 94.9  MCH 32.8 32.2 32.6  MCHC 33.9 33.8 34.3  RDW 14.7 14.5 14.6  LYMPHSABS 1.7 1.5  --   MONOABS 1.0 1.0  --   EOSABS 0.3 0.2  --   BASOSABS 0.0 0.0  --     Chemistries   Recent Labs Lab 11/01/13 1645 11/04/13 1922 11/05/13 0325 11/07/13 0600  NA 137 136* 137 135*  K 3.9 5.0 3.6* 3.8  CL 99 97 97 93*  CO2 29 26 25 28   GLUCOSE 95 117* 102* 90  BUN 28* 41* 41* 45*  CREATININE 1.8* 1.86* 1.88* 2.06*  CALCIUM 9.1 9.3 8.8 9.3  AST  --  75* 57*  --   ALT  --  37 33  --     ALKPHOS  --  39 36*  --   BILITOT  --  1.0 1.2  --    ------------------------------------------------------------------------------------------------------------------ estimated creatinine clearance  is 34.4 ml/min (by C-G formula based on Cr of 2.06). ------------------------------------------------------------------------------------------------------------------ No results found for this basename: HGBA1C,  in the last 72 hours ------------------------------------------------------------------------------------------------------------------ No results found for this basename: CHOL, HDL, LDLCALC, TRIG, CHOLHDL, LDLDIRECT,  in the last 72 hours ------------------------------------------------------------------------------------------------------------------  Recent Labs  11/05/13 0325  TSH 2.897   ------------------------------------------------------------------------------------------------------------------ No results found for this basename: VITAMINB12, FOLATE, FERRITIN, TIBC, IRON, RETICCTPCT,  in the last 72 hours  Coagulation profile No results found for this basename: INR, PROTIME,  in the last 168 hours  No results found for this basename: DDIMER,  in the last 72 hours  Cardiac Enzymes  Recent Labs Lab 11/01/13 1645 11/04/13 1922 11/05/13 0325  CKMB 4.3  --   --   TROPONINI  --  <0.30 <0.30   ------------------------------------------------------------------------------------------------------------------ No components found with this basename: POCBNP,     Tiant Peixoto D.O. on 11/07/2013 at 8:24 AM  Between 7am to 7pm - Pager - [phone]  After 7pm go to [email or web address] - password [email or web address]  And look for the night coverage person covering for me after hours  [facility] Office  [phone]

## 2013-11-07 NOTE — Progress Notes (Signed)
1900 sound asleep in bed . Respiration easy and . Regular . safet precautions maintained

## 2013-11-08 DIAGNOSIS — R0989 Other specified symptoms and signs involving the circulatory and respiratory systems: Secondary | ICD-10-CM

## 2013-11-08 DIAGNOSIS — R0609 Other forms of dyspnea: Secondary | ICD-10-CM

## 2013-11-08 LAB — GLUCOSE, CAPILLARY
GLUCOSE-CAPILLARY: 123 mg/dL — AB (ref 70–99)
GLUCOSE-CAPILLARY: 161 mg/dL — AB (ref 70–99)
Glucose-Capillary: 145 mg/dL — ABNORMAL HIGH (ref 70–99)
Glucose-Capillary: 149 mg/dL — ABNORMAL HIGH (ref 70–99)

## 2013-11-08 LAB — RENAL FUNCTION PANEL
Albumin: 3.2 g/dL — ABNORMAL LOW (ref 3.5–5.2)
BUN: 45 mg/dL — ABNORMAL HIGH (ref 6–23)
CALCIUM: 9.5 mg/dL (ref 8.4–10.5)
CO2: 33 mEq/L — ABNORMAL HIGH (ref 19–32)
Chloride: 92 mEq/L — ABNORMAL LOW (ref 96–112)
Creatinine, Ser: 2.14 mg/dL — ABNORMAL HIGH (ref 0.50–1.35)
GFR calc Af Amer: 32 mL/min — ABNORMAL LOW (ref >90)
GFR calc non Af Amer: 27 mL/min — ABNORMAL LOW (ref >90)
GLUCOSE: 73 mg/dL (ref 70–99)
PHOSPHORUS: 4.5 mg/dL (ref 2.3–4.6)
Potassium: 3.3 mEq/L — ABNORMAL LOW (ref 3.7–5.3)
Sodium: 138 mEq/L (ref 137–147)

## 2013-11-08 MED ORDER — LEVOFLOXACIN 750 MG PO TABS
750.0000 mg | ORAL_TABLET | ORAL | Status: DC
  Administered 2013-11-09: 750 mg via ORAL
  Filled 2013-11-08: qty 1

## 2013-11-08 MED ORDER — POTASSIUM CHLORIDE CRYS ER 20 MEQ PO TBCR
40.0000 meq | EXTENDED_RELEASE_TABLET | Freq: Once | ORAL | Status: AC
  Administered 2013-11-08: 40 meq via ORAL
  Filled 2013-11-08: qty 2

## 2013-11-08 MED ORDER — IPRATROPIUM-ALBUTEROL 0.5-2.5 (3) MG/3ML IN SOLN
3.0000 mL | Freq: Two times a day (BID) | RESPIRATORY_TRACT | Status: DC
  Administered 2013-11-08 – 2013-11-09 (×2): 3 mL via RESPIRATORY_TRACT
  Filled 2013-11-08: qty 3

## 2013-11-08 NOTE — Consult Note (Signed)
Heart Failure Navigator Consult Note  Presentation: [name] [name] is a 80 y.o. male history of CHF, chronic kidney disease, diabetes mellitus, hypertension was brought to the ER because of increasing lower extremity edema and pain. Patient is a poor historian and most of the history is obtained from previous charts ER physician and patient's niece. Patient also has been having shortness of breath on exertion over the last few months and was admitted last October for CHF and at that time patient had stress Myoview which was showing low risk and 2-D echo was showing EF of 55% with severe pulmonary hypertension. Patient eventually has been following up with pulmonologist for shortness of breath and CAT scan done in December were showing possible interstitial lung disease and a repeat CAT scan has been ordered by his pulmonologist. Patient has been recently placed on prednisone. Patient's on my exam denies any chest pain and states that he's been having some cough for last 3 weeks which was productive of sputum. His pain in his lower extremities has recently worsened with increasing swelling. It is not sure if patient has been taking his diuretics as advised. X-ray shows moderate right-sided pleural effusion with possible atelectasis versus infiltrates. Patient in the ER has been started on Lasix 40 mg IV and admitted for CHF exacerbation.    Past Medical History  Diagnosis Date  . Hypertension   . Shortness of breath     with exertion  . Diabetes mellitus     does not check his blood sugar at home  . Arthritis   . CHF (congestive heart failure)   . COPD (chronic obstructive pulmonary disease)   . Hyperlipidemia   . Chronic kidney disease     renal insuficiency    History   Social History  . Marital Status: Single    Spouse Name: N/A    Number of Children: N/A  . Years of Education: N/A   Social History Main Topics  . Smoking status: Former Smoker -- 0.50 packs/day for 70 years    Types:  Cigarettes    Quit date: 05/14/2009  . Smokeless tobacco: Never Used  . Alcohol Use: No  . Drug Use: No     Comment: quit marijuana 3-4 years ago  . Sexual Activity: No   Other Topics Concern  . None   Social History Narrative  . None    ECHO:Study Conclusions- 03/25/13  - Left ventricle: The cavity size was normal. Wall thickness was increased in a pattern of mild LVH. There was moderate focal basal hypertrophy of the septum. Systolic function was normal. The estimated ejection fraction was in the range of 50% to 55%. Wall motion was normal; there were no regional wall motion abnormalities. - Aortic valve: Trivial regurgitation. - Mitral valve: Calcified annulus. Moderate regurgitation. - Left atrium: The atrium was mildly dilated. - Pulmonary arteries: Systolic pressure was moderately increased. PA peak pressure: 61mm Hg (S).   BNP    Component Value Date/Time   PROBNP 8116.0* 11/04/2013 1922    Education Assessment and Provision:  Detailed education and instructions provided on heart failure disease management including the following:  Signs and symptoms of Heart Failure When to call the physician Importance of daily weights Low sodium diet  Fluid restriction Medication management Anticipated future follow-up appointments  Patient education given on each of the above topics.  Patient acknowledges understanding and acceptance of all instructions.  I spoke with Mr. Eltringham about his HF.  He has limited insight into his  condition.  He does not have a scale after what he describes as being on a telehealth program and having his phone line disconnected "so they took the scale back".  I will provide a scale so he can have one at discharge.  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  He is not able to articulate that he follows a low sodium diet--he says "everybody messes up sometimes".   I reinforced how important this is to his  health.  He claims he has no problem getting his medications and takes them just like they are prescribed.  Education Materials:  "Living Better With Heart Failure" Booklet, Daily Weight Tracker Tool and Heart Failure Educational Video.   High Risk Criteria for Readmission and/or Poor Patient Outcomes:   EF <30%- No 50-55%  2 or more admissions in 6 months- Yes  Difficult social situation- Yes--he has many people in his house and describes himself as the "caretaker"  Demonstrates medication noncompliance- No   Barriers of Care:  Knowledge of medical conditions, financial constraints and compliance  Discharge Planning:   Plans to return home with his son, son's wife, their children and a blind friend whom he says he "helps out".  Notes to Patient's Outpatient Care Team for Continued Management in the Community:  He needs close follow-up and continued ongoing HF education.

## 2013-11-08 NOTE — Clinical Documentation Improvement (Signed)
Presents with acute on chronic diastolic heart failure. In progress notes, it states " Chest Xray with RLL infiltrate or atelectasis. Continue Levaquin.   Please clarify if pneumonia: Ruled in                                       Ruled out      Atelectasis as documented   Thank You, Zoila Shutter ,RN Clinical Documentation Specialist:  [facility] [employer]

## 2013-11-08 NOTE — Progress Notes (Deleted)
1730 pleasant.t and conversant . Looking forward to go home, but  With anxiety

## 2013-11-08 NOTE — Progress Notes (Signed)
[facility]                                                                              Patient Demographics  [name] [name], is a 80 y.o. male, DOB - 1931/07/23, KXF:[id number]  Admit date - 11/04/2013   Admitting Physician Rise Patience, MD  Outpatient Primary MD for the patient is [name], MD  LOS - 4   Chief Complaint  Patient presents with  . Leg Pain        Assessment & Plan   Acute on chronic diastolic CHF (congestive heart failure)/Lower extremity edema:  -Echo in October 2014 which showed EF of 55% with severe pulmonary hypertension.  -Continue low sodium diet with fluid restriction, daily weights, Strict I's and O's, elevate lower extremities if patient can tolerate -Will discontinue lasix drip -down 14kg since admission  CAP -CXR shows RLL infiltrate or atelectasis -Continue levaquin -Patient currently afebrile with no leukocytosis  Type II or unspecified type diabetes mellitus without mention of complication, uncontrolled  -Continue tradjenta, repaglinide, ISS and CBG monitoring  HTN (hypertension):  - Due to non compliance.  - continue metoprolol, amlodipine, lisinopril - BP high due to massive volume overload.   Acute on CKD (chronic kidney disease), stage III  - Cr 2.14, baseline Cr 1.3-1.5.   - likely due to lasix drip, will discontinue and monitor  Lower extremity wounds -LE doppler showed no DVT -Would care consuted   Code Status: Full  Family Communication: None at bedside.  Disposition Plan: Admitted, likely discharge 3/28 pending renal function  Time Spent in minutes   20 minutes  Procedures  Lower extremity venous duplex: No obvious evidence of DVT ABI: Right 0.75, left 0.81  Consults  None  DVT Prophylaxis  Lovenox  Lab Results  Component Value Date   PLT 207 11/07/2013    Medications  Scheduled Meds: . amLODipine  5 mg Oral Daily  . aspirin EC  325 mg Oral Daily  . enoxaparin (LOVENOX)  injection  40 mg Subcutaneous Q24H  . fenofibrate  160 mg Oral Daily  . insulin aspart  0-9 Units Subcutaneous TID WC  . ipratropium-albuterol  3 mL Nebulization TID  . latanoprost  1 drop Both Eyes QHS  . levofloxacin (LEVAQUIN) IV  750 mg Intravenous Q48H  . linagliptin  5 mg Oral Daily  . lisinopril  2.5 mg Oral Daily  . metoprolol succinate  25 mg Oral Daily  . potassium chloride  40 mEq Oral Once  . prednisoLONE acetate  1 drop Both Eyes BID  . predniSONE  10 mg Oral Q breakfast  . repaglinide  1 mg Oral BID AC  . sodium chloride  3 mL Intravenous Q12H  . sodium chloride  3 mL Intravenous Q12H   Continuous Infusions:   PRN Meds:.acetaminophen, acetaminophen, benzonatate, diphenhydrAMINE, HYDROcodone-acetaminophen, ipratropium-albuterol, ondansetron (ZOFRAN) IV, ondansetron  Antibiotics   Anti-infectives   Start     Dose/Rate Route Frequency Ordered Stop   11/06/13 2200  levofloxacin (LEVAQUIN) IVPB 750 mg    Comments:  Levaquin 750 mg IV q48h for CrCl < 50 mL/min   750 mg 100 mL/hr over 90 Minutes Intravenous Every 48 hours  11/04/13 2303     11/04/13 2359  levofloxacin (LEVAQUIN) IVPB 750 mg     750 mg 100 mL/hr over 90 Minutes Intravenous  Once 11/04/13 2303 11/05/13 0134        Subjective:   [name] [name] seen and examined today.  Patient has no complaints today and feels his breathing has improved.  Objective:   Filed Vitals:   11/07/13 1522 11/07/13 2109 11/07/13 2143 11/08/13 0503  BP:  148/45  132/50  Pulse:  85 81 82  Temp:  98.1 F (36.7 C)  97.9 F (36.6 C)  TempSrc:  Oral  Oral  Resp:  20 18 18   Height:      Weight:    100.29 kg (221 lb 1.6 oz)  SpO2: 95% 94% 95% 98%    Wt Readings from Last 3 Encounters:  11/08/13 100.29 kg (221 lb 1.6 oz)  11/01/13 117.119 kg (258 lb 3.2 oz)  09/03/13 107.502 kg (237 lb)     Intake/Output Summary (Last 24 hours) at 11/08/13 9983 Last data filed at 11/08/13 0531  Gross per 24 hour  Intake   1078 ml    Output   2226 ml  Net  -1148 ml    Exam  General: Well developed, well nourished, NAD, appears stated age  68: NCAT, PERRLA, Anicteric sclera, moist membranes  Neck: Supple, no masses  Cardiovascular: S1 S2 auscultated, regular rate and rhythm.  Respiratory: Clear to auscultation bilaterally with equal chest rise  Abdomen: Soft, nontender, nondistended, +bowel sounds  Extremities: warm dry without cyanosis clubbing or edema, lower ext wrapped  Neuro: AAOx3, no sensory or motor deficits  Skin: Without rashes exudates or nodules  Data Review   Micro Results No results found for this or any previous visit (from the past 240 hour(s)).  Radiology Reports Dg Chest Portable 1 View  11/04/2013   CLINICAL DATA:  Leg pain, shortness of Breath  EXAM: PORTABLE CHEST - 1 VIEW  COMPARISON:  09/19/2013  FINDINGS: Cardiomegaly again noted. Central mild vascular congestion without convincing pulmonary edema. There is moderate right pleural effusion with right lower lobe atelectasis or infiltrate.  IMPRESSION: Cardiomegaly. Moderate right pleural effusion with right lower lobe atelectasis or infiltrate.   Electronically Signed   By: Lahoma Crocker M.D.   On: 11/04/2013 19:57    CBC  Recent Labs Lab 11/04/13 1922 11/05/13 0325 11/07/13 0600  WBC 7.4 7.2 9.4  HGB 13.1 11.8* 12.8*  HCT 38.7* 34.9* 37.3*  PLT 207 206 207  MCV 97.0 95.1 94.9  MCH 32.8 32.2 32.6  MCHC 33.9 33.8 34.3  RDW 14.7 14.5 14.6  LYMPHSABS 1.7 1.5  --   MONOABS 1.0 1.0  --   EOSABS 0.3 0.2  --   BASOSABS 0.0 0.0  --     Chemistries   Recent Labs Lab 11/01/13 1645 11/04/13 1922 11/05/13 0325 11/07/13 0600 11/08/13 0450  NA 137 136* 137 135* 138  K 3.9 5.0 3.6* 3.8 3.3*  CL 99 97 97 93* 92*  CO2 29 26 25 28  33*  GLUCOSE 95 117* 102* 90 73  BUN 28* 41* 41* 45* 45*  CREATININE 1.8* 1.86* 1.88* 2.06* 2.14*  CALCIUM 9.1 9.3 8.8 9.3 9.5  AST  --  75* 57*  --   --   ALT  --  37 33  --   --   ALKPHOS   --  39 36*  --   --   BILITOT  --  1.0 1.2  --   --    ------------------------------------------------------------------------------------------------------------------  estimated creatinine clearance is 32.1 ml/min (by C-G formula based on Cr of 2.14). ------------------------------------------------------------------------------------------------------------------ No results found for this basename: HGBA1C,  in the last 72 hours ------------------------------------------------------------------------------------------------------------------ No results found for this basename: CHOL, HDL, LDLCALC, TRIG, CHOLHDL, LDLDIRECT,  in the last 72 hours ------------------------------------------------------------------------------------------------------------------ No results found for this basename: TSH, T4TOTAL, FREET3, T3FREE, THYROIDAB,  in the last 72 hours ------------------------------------------------------------------------------------------------------------------ No results found for this basename: VITAMINB12, FOLATE, FERRITIN, TIBC, IRON, RETICCTPCT,  in the last 72 hours  Coagulation profile No results found for this basename: INR, PROTIME,  in the last 168 hours  No results found for this basename: DDIMER,  in the last 72 hours  Cardiac Enzymes  Recent Labs Lab 11/01/13 1645 11/04/13 1922 11/05/13 0325  CKMB 4.3  --   --   TROPONINI  --  <0.30 <0.30   ------------------------------------------------------------------------------------------------------------------ No components found with this basename: POCBNP,     Aylssa Herrig D.O. on 11/08/2013 at 8:12 AM  Between 7am to 7pm - Pager - [phone]  After 7pm go to [email or web address] - password [email or web address]  And look for the night coverage person covering for me after hours  [facility] Office  [phone]

## 2013-11-08 NOTE — Progress Notes (Signed)
Physical Therapy Treatment Patient Details Name: [name] [name] MRN: [id number] DOB: 1930/09/11 Today's Date: 11/08/2013    History of Present Illness [name] [name] is a 80 y.o. male history of CHF, chronic kidney disease, diabetes mellitus, hypertension was brought to the ER because of increasing lower extremity edema and pain    PT Comments    Pt demonstrates poor safety awareness and is a high fall risk. States he has home O2 (and needed O2 to maintain sats >88% while ambulating), and did very poorly trying to manage O2 tubing while walking with RW. Does not appear to have reliable 24/7 assist at home, yet will not agree to go to SNF.    Follow Up Recommendations  Supervision/Assistance - 24 hour;SNF (if pt will not agree to SNF, then needs HHPT)     Equipment Recommendations  Rolling walker with 5" wheels    Recommendations for Other Services OT consult     Precautions / Restrictions Precautions Precautions: Fall Precaution Comments: friend in room states that grandchild reported that pt was found face down outside of home the other day Restrictions Weight Bearing Restrictions: No Other Position/Activity Restrictions: visual impairment    Mobility  Bed Mobility               General bed mobility comments: pt standing in room upon arrival  Transfers Overall transfer level: Needs assistance Equipment used: Rolling walker (2 wheeled) Transfers: Sit to/from Stand Sit to Stand: Min assist         General transfer comment: x2; once from chair without armrests and needed min A for balance (Rt lateral LOB); vc's for proper hand placement. Pt uses momentum to rise from chair, min-guard for safety  Ambulation/Gait Ambulation/Gait assistance: Min assist Ambulation Distance (Feet): 100 Feet (40, seated rest to get O2, 60 ft) Assistive device: Rolling walker (2 wheeled) Gait Pattern/deviations: Step-through pattern;Trunk flexed Gait velocity: decreased   General Gait  Details: required min assist for balance when reaching to pick up trash can he knocked over when walking with RW   Stairs            Wheelchair Mobility    Modified Rankin (Stroke Patients Only)       Balance     Sitting balance-Leahy Scale: Good       Standing balance-Leahy Scale: Poor                      Cognition Arousal/Alertness: Awake/alert Behavior During Therapy: Impulsive Overall Cognitive Status: History of cognitive impairments - at baseline Area of Impairment: Safety/judgement;Problem solving;Memory     Memory: Decreased short-term memory;Decreased recall of precautions   Safety/Judgement: Decreased awareness of safety;Decreased awareness of deficits   Problem Solving: Slow processing General Comments: pt observed walking in room with cane by himself, reaching to hold onto rolling table and rolling computer; very unsteady    Exercises      General Comments General comments (skin integrity, edema, etc.): Pt reports he has O2 at home and uses it sometimes. Pt tried to manage O2 tubing while pushing RW and knocked over trash can when not able to attend to both tasks. He has the right idea of winding tubing around his hand and then letting it out as needed.       Pertinent Vitals/Pain See vitals flow sheet.     Home Living                      Prior Function  PT Goals (current goals can now be found in the care plan section) Acute Rehab PT Goals Patient Stated Goal: return home Progress towards PT goals: Progressing toward goals    Frequency  Min 3X/week    PT Plan Discharge plan needs to be updated    End of Session Equipment Utilized During Treatment: Gait belt;Oxygen Activity Tolerance: Patient tolerated treatment well Patient left: in chair;with chair alarm set;with call bell/phone within reach     Time: 1630-1655 PT Time Calculation (min): 25 min  Charges:  $Gait Training: 23-37 mins                     G Codes:      Christell Steinmiller 2013/11/28, 5:12 PM Pager [phone]

## 2013-11-09 LAB — RENAL FUNCTION PANEL
ALBUMIN: 3.3 g/dL — AB (ref 3.5–5.2)
BUN: 41 mg/dL — ABNORMAL HIGH (ref 6–23)
CO2: 25 mEq/L (ref 19–32)
Calcium: 9.4 mg/dL (ref 8.4–10.5)
Chloride: 92 mEq/L — ABNORMAL LOW (ref 96–112)
Creatinine, Ser: 1.88 mg/dL — ABNORMAL HIGH (ref 0.50–1.35)
GFR calc Af Amer: 37 mL/min — ABNORMAL LOW (ref >90)
GFR, EST NON AFRICAN AMERICAN: 32 mL/min — AB (ref >90)
GLUCOSE: 118 mg/dL — AB (ref 70–99)
POTASSIUM: 4.2 meq/L (ref 3.7–5.3)
Phosphorus: 3.9 mg/dL (ref 2.3–4.6)
Sodium: 134 mEq/L — ABNORMAL LOW (ref 137–147)

## 2013-11-09 LAB — GLUCOSE, CAPILLARY: Glucose-Capillary: 110 mg/dL — ABNORMAL HIGH (ref 70–99)

## 2013-11-09 MED ORDER — LEVOFLOXACIN 750 MG PO TABS: 750.0000 mg | ORAL_TABLET | ORAL | Status: DC

## 2013-11-09 NOTE — Discharge Instructions (Signed)
Heart Failure °Heart failure is a condition in which the heart has trouble pumping blood. This means your heart does not pump blood efficiently for your body to work well. In some cases of heart failure, fluid may back up into your lungs or you may have swelling (edema) in your lower legs. Heart failure is usually a long-term (chronic) condition. It is important for you to take good care of yourself and follow your caregiver's treatment plan. °CAUSES  °Some health conditions can cause heart failure. Those health conditions include: °· High blood pressure (hypertension) causes the heart muscle to work harder than normal. When pressure in the blood vessels is high, the heart needs to pump (contract) with more force in order to circulate blood throughout the body. High blood pressure eventually causes the heart to become stiff and weak. °· Coronary artery disease (CAD) is the buildup of cholesterol and fat (plaque) in the arteries of the heart. The blockage in the arteries deprives the heart muscle of oxygen and blood. This can cause chest pain and may lead to a heart attack. High blood pressure can also contribute to CAD. °· Heart attack (myocardial infarction) occurs when 1 or more arteries in the heart become blocked. The loss of oxygen damages the muscle tissue of the heart. When this happens, part of the heart muscle dies. The injured tissue does not contract as well and weakens the heart's ability to pump blood. °· Abnormal heart valves can cause heart failure when the heart valves do not open and close properly. This makes the heart muscle pump harder to keep the blood flowing. °· Heart muscle disease (cardiomyopathy or myocarditis) is damage to the heart muscle from a variety of causes. These can include drug or alcohol abuse, infections, or unknown reasons. These can increase the risk of heart failure. °· Lung disease makes the heart work harder because the lungs do not work properly. This can cause a strain  on the heart, leading it to fail. °· Diabetes increases the risk of heart failure. High blood sugar contributes to high fat (lipid) levels in the blood. Diabetes can also cause slow damage to tiny blood vessels that carry important nutrients to the heart muscle. When the heart does not get enough oxygen and food, it can cause the heart to become weak and stiff. This leads to a heart that does not contract efficiently. °· Other conditions can contribute to heart failure. These include abnormal heart rhythms, thyroid problems, and low blood counts (anemia). °Certain unhealthy behaviors can increase the risk of heart failure. Those unhealthy behaviors include: °· Being overweight. °· Smoking or chewing tobacco. °· Eating foods high in fat and cholesterol. °· Abusing illicit drugs or alcohol. °· Lacking physical activity. °SYMPTOMS  °Heart failure symptoms may vary and can be hard to detect. Symptoms may include: °· Shortness of breath with activity, such as climbing stairs. °· Persistent cough. °· Swelling of the feet, ankles, legs, or abdomen. °· Unexplained weight gain. °· Difficulty breathing when lying flat (orthopnea). °· Waking from sleep because of the need to sit up and get more air. °· Rapid heartbeat. °· Fatigue and loss of energy. °· Feeling lightheaded, dizzy, or close to fainting. °· Loss of appetite. °· Nausea. °· Increased urination during the night (nocturia). °DIAGNOSIS  °A diagnosis of heart failure is based on your history, symptoms, physical examination, and diagnostic tests. °Diagnostic tests for heart failure may include: °· Echocardiography. °· Electrocardiography. °· Chest X-ray. °· Blood tests. °· Exercise   stress test. °· Cardiac angiography. °· Radionuclide scans. °TREATMENT  °Treatment is aimed at managing the symptoms of heart failure. Medicines, behavioral changes, or surgical intervention may be necessary to treat heart failure. °· Medicines to help treat heart failure may  include: °· Angiotensin-converting enzyme (ACE) inhibitors. This type of medicine blocks the effects of a blood protein called angiotensin-converting enzyme. ACE inhibitors relax (dilate) the blood vessels and help lower blood pressure. °· Angiotensin receptor blockers. This type of medicine blocks the actions of a blood protein called angiotensin. Angiotensin receptor blockers dilate the blood vessels and help lower blood pressure. °· Water pills (diuretics). Diuretics cause the kidneys to remove salt and water from the blood. The extra fluid is removed through urination. This loss of extra fluid lowers the volume of blood the heart pumps. °· Beta blockers. These prevent the heart from beating too fast and improve heart muscle strength. °· Digitalis. This increases the force of the heartbeat. °· Healthy behavior changes include: °· Obtaining and maintaining a healthy weight. °· Stopping smoking or chewing tobacco. °· Eating heart healthy foods. °· Limiting or avoiding alcohol. °· Stopping illicit drug use. °· Physical activity as directed by your caregiver. °· Surgical treatment for heart failure may include: °· A procedure to open blocked arteries, repair damaged heart valves, or remove damaged heart muscle tissue. °· A pacemaker to improve heart muscle function and control certain abnormal heart rhythms. °· An internal cardioverter defibrillator to treat certain serious abnormal heart rhythms. °· A left ventricular assist device to assist the pumping ability of the heart. °HOME CARE INSTRUCTIONS  °· Take your medicine as directed by your caregiver. Medicines are important in reducing the workload of your heart, slowing the progression of heart failure, and improving your symptoms. °· Do not stop taking your medicine unless directed by your caregiver. °· Do not skip any dose of medicine. °· Refill your prescriptions before you run out of medicine. Your medicines are needed every day. °· Take over-the-counter  medicine only as directed by your caregiver or pharmacist. °· Engage in moderate physical activity if directed by your caregiver. Moderate physical activity can benefit some people. The elderly and people with severe heart failure should consult with a caregiver for physical activity recommendations. °· Eat heart healthy foods. Food choices should be free of trans fat and low in saturated fat, cholesterol, and salt (sodium). Healthy choices include fresh or frozen fruits and vegetables, fish, lean meats, legumes, fat-free or low-fat dairy products, and whole grain or high fiber foods. Talk to a dietitian to learn more about heart healthy foods. °· Limit sodium if directed by your caregiver. Sodium restriction may reduce symptoms of heart failure in some people. Talk to a dietitian to learn more about heart healthy seasonings. °· Use healthy cooking methods. Healthy cooking methods include roasting, grilling, broiling, baking, poaching, steaming, or stir-frying. Talk to a dietitian to learn more about healthy cooking methods. °· Limit fluids if directed by your caregiver. Fluid restriction may reduce symptoms of heart failure in some people. °· Weigh yourself every day. Daily weights are important in the early recognition of excess fluid. You should weigh yourself every morning after you urinate and before you eat breakfast. Wear the same amount of clothing each time you weigh yourself. Record your daily weight. Provide your caregiver with your weight record. °· Monitor and record your blood pressure if directed by your caregiver. °· Check your pulse if directed by your caregiver. °· Lose weight if directed   by your caregiver. Weight loss may reduce symptoms of heart failure in some people. °· Stop smoking or chewing tobacco. Nicotine makes your heart work harder by causing your blood vessels to constrict. Do not use nicotine gum or patches before talking to your caregiver. °· Schedule and attend follow-up visits as  directed by your caregiver. It is important to keep all your appointments. °· Limit alcohol intake to no more than 1 drink per day for nonpregnant women and 2 drinks per day for men. Drinking more than that is harmful to your heart. Tell your caregiver if you drink alcohol several times a week. Talk with your caregiver about whether alcohol is safe for you. If your heart has already been damaged by alcohol or you have severe heart failure, drinking alcohol should be stopped completely. °· Stop illicit drug use. °· Stay up-to-date with immunizations. It is especially important to prevent respiratory infections through current pneumococcal and influenza immunizations. °· Manage other health conditions such as hypertension, diabetes, thyroid disease, or abnormal heart rhythms as directed by your caregiver. °· Learn to manage stress. °· Plan rest periods when fatigued. °· Learn strategies to manage high temperatures. If the weather is extremely hot: °· Avoid vigorous physical activity. °· Use air conditioning or fans or seek a cooler location. °· Avoid caffeine and alcohol. °· Wear loose-fitting, lightweight, and light-colored clothing. °· Learn strategies to manage cold temperatures. If the weather is extremely cold: °· Avoid vigorous physical activity. °· Layer clothes. °· Wear mittens or gloves, a hat, and a scarf when going outside. °· Avoid alcohol. °· Obtain ongoing education and support as needed. °· Participate or seek rehabilitation as needed to maintain or improve independence and quality of life. °SEEK MEDICAL CARE IF:  °· Your weight increases by 03 lb/1.4 kg in 1 day or 05 lb/2.3 kg in a week. °· You have increasing shortness of breath that is unusual for you. °· You are unable to participate in your usual physical activities. °· You tire easily. °· You cough more than normal, especially with physical activity. °· You have any or more swelling in areas such as your hands, feet, ankles, or abdomen. °· You  are unable to sleep because it is hard to breathe. °· You feel like your heart is beating fast (palpitations). °· You become dizzy or lightheaded upon standing up. °SEEK IMMEDIATE MEDICAL CARE IF:  °· You have difficulty breathing. °· There is a change in mental status such as decreased alertness or difficulty with concentration. °· You have a pain or discomfort in your chest. °· You have an episode of fainting (syncope). °MAKE SURE YOU:  °· Understand these instructions. °· Will watch your condition. °· Will get help right away if you are not doing well or get worse. °Document Released: 08/01/2005 Document Revised: 11/26/2012 Document Reviewed: 08/23/2012 °[employer]® Patient Information ©2014 [employer]. ° °

## 2013-11-09 NOTE — Discharge Summary (Signed)
Physician Discharge Summary  Davyd Podgorski POE:[id number] DOB: [birth year] DOA: 11/04/2013  PCP: [name], MD  Admit date: 11/04/2013 Discharge date: 11/09/2013  Time spent: 45 minutes  Recommendations for Outpatient Follow-up:  Patient will be discharged home. Will discharge patient home with home health. Patient does not wish to go to a nursing home at this time.  He should follow up with his primary care physician within one week of discharge. Patient should continue taking his medications as prescribed. Patient should also have his renal function monitored within one week of discharge.  Discharge Diagnoses:  Principal Problem:   Acute on chronic diastolic CHF (congestive heart failure) Active Problems:   Community acquired pneumonia   Acute on chronic CKD (chronic kidney disease), stage III   HTN (hypertension)   Type II or unspecified type diabetes mellitus without mention of complication, uncontrolled   Lower extremity edema   Lower extremity wounds  Discharge Condition: Stable  Diet recommendation: Heart healthy, low sodium, 1500 mL per day fluid restriction  Filed Weights   11/07/13 0453 11/08/13 0503 11/09/13 0552  Weight: 106.686 kg (235 lb 3.2 oz) 100.29 kg (221 lb 1.6 oz) 100.336 kg (221 lb 3.2 oz)    History of present illness:  [name] [name] is a 80 y.o. male history of CHF, chronic kidney disease, diabetes mellitus, hypertension was brought to the ER because of increasing lower extremity edema and pain. Patient is a poor historian and most of the history is obtained from previous charts ER physician and patient's niece. Patient also has been having shortness of breath on exertion over the last few months and was admitted last October for CHF and at that time patient had stress Myoview which was showing low risk and 2-D echo was showing EF of 55% with severe pulmonary hypertension. Patient eventually has been following up with pulmonologist for shortness of  breath and CAT scan done in December were showing possible interstitial lung disease and a repeat CAT scan has been ordered by his pulmonologist. Patient has been recently placed on prednisone. Initial exam, denied any chest pain and states that he's been having some cough for last 3 weeks which was productive of sputum. His pain in his lower extremities had recently worsened with increasing swelling. It is not sure if patient has been taking his diuretics as advised. X-ray shows moderate right-sided pleural effusion with possible atelectasis versus infiltrates. Patient in the ER has been started on Lasix 40 mg IV and admitted for CHF exacerbation.  Patient otherwise denies any nausea vomiting abdominal pain diarrhea or any focal deficits.   Hospital Course:  Acute on chronic diastolic CHF (congestive heart failure)/Lower extremity edema:  -Echo in October 2014 which showed EF of 55% with severe pulmonary hypertension.  -Continue low sodium diet with fluid restriction, daily weights, Strict I's and O's, elevate lower extremities if patient can tolerate  -Patient was initially placed on Lasix drip, this was discontinued due to his acute kidney injury. -down 14kg since admission   Community acquired pneumonia -CXR shows RLL infiltrate or atelectasis  -Continue levaquin  -Patient currently afebrile with no leukocytosis   Type II or unspecified type diabetes mellitus without mention of complication, uncontrolled  -Continue tradjenta, repaglinide, ISS and CBG monitoring   HTN (hypertension):  - Due to non compliance.  - continue metoprolol, amlodipine, lisinopril  - BP high due to massive volume overload.   Acute on CKD (chronic kidney disease), stage III  - Improving, Cr 1.88, baseline Cr 1.3-1.5.  -  likely due to lasix drip -Patient should resume his home medication, should have his renal function retested and monitored in 1 week.  Lower extremity wounds  -LE doppler showed no DVT  -Would  care consuted   Procedures: Lower extremity venous duplex: No obvious evidence of DVT  ABI: Right 0.75, left 0.81  Consultations: Wound care  Discharge Exam: Filed Vitals:   11/09/13 0930  BP: 113/48  Pulse:   Temp:   Resp:    Exam  General: Well developed, well nourished, NAD, appears stated age  HEENT: NCAT, PERRLA, Anicteric sclera, moist membranes  Neck: Supple, no masses  Cardiovascular: S1 S2 auscultated, regular rate and rhythm.  Respiratory: Clear to auscultation bilaterally with equal chest rise  Abdomen: Soft, nontender, nondistended, +bowel sounds  Extremities: warm dry without cyanosis clubbing or edema, lower ext wrapped  Neuro: AAOx3, no sensory or motor deficits  Skin: Without rashes exudates or nodules   Discharge Instructions      Discharge Orders   Future Appointments Provider Department Dept Phone   11/19/2013 9:00 AM Melvenia Needles, NP [facility] [phone]   11/20/2013 1:00 PM [facility] [place] [facility] [place] [phone]   11/20/2013 1:30 PM [facility] [place] [facility] [phone]   Please arrive 15 minutes prior to appointment time.   Future Orders Complete By Expires   Diet - low sodium heart healthy  As directed    Discharge instructions  As directed    Comments:     Patient will be discharged home. Will discharge patient home with home health. Patient does not wish to go to a nursing home at this time.  He should follow up with his primary care physician within one week of discharge. Patient should continue taking his medications as prescribed. Patient should also have his renal function monitored within one week of discharge.   Increase activity slowly  As directed        Medication List         albuterol (2.5 MG/3ML) 0.083% nebulizer solution  Commonly known as:  PROVENTIL  Take 3 mLs (2.5 mg total) by nebulization daily.     amLODipine 5 MG tablet  Commonly known as:   NORVASC  Take 5 mg by mouth daily.     aspirin EC 81 MG tablet  Take 81 mg by mouth daily.     fenofibrate 160 MG tablet  Take 160 mg by mouth daily.     levofloxacin 750 MG tablet  Commonly known as:  LEVAQUIN  Take 1 tablet (750 mg total) by mouth every other day.     linagliptin 5 MG Tabs tablet  Commonly known as:  TRADJENTA  Take 5 mg by mouth daily.     lisinopril 2.5 MG tablet  Commonly known as:  ZESTRIL  Take 1 tablet (2.5 mg total) by mouth daily.     metoprolol succinate 25 MG 24 hr tablet  Commonly known as:  TOPROL-XL  Take 25 mg by mouth daily.     prednisoLONE acetate 1 % ophthalmic suspension  Commonly known as:  PRED FORTE  Place 1 drop into both eyes at bedtime.     predniSONE 10 MG tablet  Commonly known as:  DELTASONE  Take 4 tabs daily x 1 days, 3 tabs daily x 1 days, 2 tabs daily x 1 days, 1 tab daily x 1 days then 1/2 tab x 1 day, then stop  repaglinide 0.5 MG tablet  Commonly known as:  PRANDIN  Take 1 mg by mouth 2 (two) times daily before a meal.     torsemide 20 MG tablet  Commonly known as:  DEMADEX  Take 1 tablet (20 mg total) by mouth 2 (two) times daily.     TRAVATAN Z 0.004 % Soln ophthalmic solution  Generic drug:  Travoprost (BAK Free)  Place 1 drop into both eyes at bedtime.       No Known Allergies Follow-up Information   Follow up with [name], MD. Schedule an appointment as soon as possible for a visit in 1 week.   Specialty:  Internal Medicine   Contact information:   [address] [place] [address] [phone]        The results of significant diagnostics from this hospitalization (including imaging, microbiology, ancillary and laboratory) are listed below for reference.    Significant Diagnostic Studies: Dg Chest Portable 1 View  11/04/2013   CLINICAL DATA:  Leg pain, shortness of Breath  EXAM: PORTABLE CHEST - 1 VIEW  COMPARISON:  09/19/2013  FINDINGS: Cardiomegaly again noted.  Central mild vascular congestion without convincing pulmonary edema. There is moderate right pleural effusion with right lower lobe atelectasis or infiltrate.  IMPRESSION: Cardiomegaly. Moderate right pleural effusion with right lower lobe atelectasis or infiltrate.   Electronically Signed   By: Lahoma Crocker M.D.   On: 11/04/2013 19:57    Microbiology: No results found for this or any previous visit (from the past 240 hour(s)).   Labs: Basic Metabolic Panel:  Recent Labs Lab 11/04/13 1922 11/05/13 0325 11/07/13 0600 11/08/13 0450 11/09/13 0525  NA 136* 137 135* 138 134*  K 5.0 3.6* 3.8 3.3* 4.2  CL 97 97 93* 92* 92*  CO2 26 25 28  33* 25  GLUCOSE 117* 102* 90 73 118*  BUN 41* 41* 45* 45* 41*  CREATININE 1.86* 1.88* 2.06* 2.14* 1.88*  CALCIUM 9.3 8.8 9.3 9.5 9.4  PHOS  --   --   --  4.5 3.9   Liver Function Tests:  Recent Labs Lab 11/04/13 1922 11/05/13 0325 11/08/13 0450 11/09/13 0525  AST 75* 57*  --   --   ALT 37 33  --   --   ALKPHOS 39 36*  --   --   BILITOT 1.0 1.2  --   --   PROT 7.0 6.3  --   --   ALBUMIN 3.3* 3.1* 3.2* 3.3*   No results found for this basename: LIPASE, AMYLASE,  in the last 168 hours No results found for this basename: AMMONIA,  in the last 168 hours CBC:  Recent Labs Lab 11/04/13 1922 11/05/13 0325 11/07/13 0600  WBC 7.4 7.2 9.4  NEUTROABS 4.3 4.5  --   HGB 13.1 11.8* 12.8*  HCT 38.7* 34.9* 37.3*  MCV 97.0 95.1 94.9  PLT 207 206 207   Cardiac Enzymes:  Recent Labs Lab 11/04/13 1922 11/05/13 0325  TROPONINI <0.30 <0.30   BNP: BNP (last 3 results)  Recent Labs  07/07/13 1949 09/19/13 2117 11/04/13 1922  PROBNP 6822.0* 6854.0* 8116.0*   CBG:  Recent Labs Lab 11/08/13 0541 11/08/13 1112 11/08/13 1628 11/08/13 2120 11/09/13 0602  GLUCAP 123* 145* 149* 161* 110*       Signed:  Barre Aydelott  [facility] 11/09/2013, 9:41 AM

## 2013-11-10 ENCOUNTER — Telehealth: Payer: Self-pay | Admitting: Internal Medicine

## 2013-11-10 NOTE — Telephone Encounter (Signed)
[name] [name]  You are seeing him soon for med calendar. His caretaker/family member has health care background but they have no clue about this medications. They were insisting fenofibrate was his diuretic!!!  Anyways,I am seeing him for ILD: his autoimmune is negative. I would not Rx his ILD with steroids. If you just did med calenar would be good enoough. However, his CK was high: he is on fenofibrate and statin: I would think he should come off his fenofibrate  Thanks  Dr. Brand Males, M.D., [facility].C.P Pulmonary and Critical Care Medicine Staff Physician [facility] Pager: [phone], If no answer or between  15:00h - 7:00h: call [phone]  11/10/2013 10:26 AM

## 2013-11-12 ENCOUNTER — Encounter: Payer: Self-pay | Admitting: Internal Medicine

## 2013-11-12 NOTE — Telephone Encounter (Signed)
Family friend called for patient to indicate that the scale he received in the hospital was not working.  I instructed her to come to the hospital so that we could change out scale.  I did give her a new scale and instructions about making sure that [name] [name] weighs daily and they inform the physician with weight increases of 3 pounds overnight and/or 5 pounds in 5 days.

## 2013-11-19 ENCOUNTER — Encounter: Payer: Medicare Other | Admitting: Adult Health

## 2013-11-20 ENCOUNTER — Ambulatory Visit
Admission: RE | Admit: 2013-11-20 | Discharge: 2013-11-20 | Disposition: A | Discharge status: Home / Self Care | Payer: PRIVATE HEALTH INSURANCE | Source: Ambulatory Visit | Attending: Cardiology | Admitting: Cardiology

## 2013-11-20 ENCOUNTER — Ambulatory Visit
Admission: RE | Admit: 2013-11-20 | Discharge: 2013-11-20 | Disposition: A | Discharge status: Home / Self Care | Payer: Medicare Other | Source: Ambulatory Visit | Attending: Cardiology | Admitting: Cardiology

## 2013-11-20 DIAGNOSIS — I872 Venous insufficiency (chronic) (peripheral): Secondary | ICD-10-CM

## 2013-11-22 ENCOUNTER — Encounter: Payer: Medicare Other | Admitting: Adult Health

## 2013-11-22 ENCOUNTER — Ambulatory Visit: Payer: Self-pay

## 2013-11-29 ENCOUNTER — Ambulatory Visit: Payer: Self-pay

## 2013-11-29 ENCOUNTER — Encounter (HOSPITAL_BASED_OUTPATIENT_CLINIC_OR_DEPARTMENT_OTHER): Payer: Medicare Other | Attending: General Surgery

## 2013-11-29 DIAGNOSIS — I129 Hypertensive chronic kidney disease with stage 1 through stage 4 chronic kidney disease, or unspecified chronic kidney disease: Secondary | ICD-10-CM | POA: Insufficient documentation

## 2013-11-29 DIAGNOSIS — I509 Heart failure, unspecified: Secondary | ICD-10-CM | POA: Insufficient documentation

## 2013-11-29 DIAGNOSIS — L97509 Non-pressure chronic ulcer of other part of unspecified foot with unspecified severity: Secondary | ICD-10-CM | POA: Insufficient documentation

## 2013-11-29 DIAGNOSIS — E119 Type 2 diabetes mellitus without complications: Secondary | ICD-10-CM | POA: Insufficient documentation

## 2013-11-29 DIAGNOSIS — Z79899 Other long term (current) drug therapy: Secondary | ICD-10-CM | POA: Insufficient documentation

## 2013-11-29 DIAGNOSIS — Z7982 Long term (current) use of aspirin: Secondary | ICD-10-CM | POA: Insufficient documentation

## 2013-11-29 DIAGNOSIS — N189 Chronic kidney disease, unspecified: Secondary | ICD-10-CM | POA: Insufficient documentation

## 2013-11-30 NOTE — Progress Notes (Signed)
[facility]  NAME:  [name] [name], [name] [name] NO.:  [id number]  MEDICAL RECORD NO.:  [id number]      DATE OF BIRTH:  Jan 07, 1931  PHYSICIAN:  Judene Companion, M.D.           VISIT DATE:                                  OFFICE VISIT   [name] is an 80 year old African American male who comes to [place] with ischemic ulcers on both of his feet.  The right is worse than the left.  I had to debride the right of necrotic skin and necrotic fat.  On the right side, I debrided it of dead skin.  I decided to treat the left foot with silver collagen and right which is less involved with a diabetic ulcer with silver alginate.  When he came to the [place], his blood pressure was 140/80, his pulse was 78, respirations 20, temperature 97.6.  He weighs 222 pounds.  He has type 2 diabetes, chronic renal disease, congestive heart failure and hypertension.  His medicines are albuterol, amlodipine, aspirin, Levaquin, lisinopril, metoprolol, Lasix, and travoprost.  He was debrided of the necrotic skin and fat and the dressings were applied.  We will have him return in a week.  So, his diagnosis is ischemic ulcers of both of his feet.  Other diagnoses are hypertension, chronic renal disease, and type 2 diabetes.     Judene Companion, M.D.     PP/MEDQ  D:  11/29/2013  T:  11/30/2013  Job:  [id number]

## 2013-12-06 ENCOUNTER — Ambulatory Visit: Payer: Self-pay

## 2013-12-13 ENCOUNTER — Encounter (HOSPITAL_BASED_OUTPATIENT_CLINIC_OR_DEPARTMENT_OTHER): Payer: Medicare Other | Attending: General Surgery

## 2013-12-13 DIAGNOSIS — L97809 Non-pressure chronic ulcer of other part of unspecified lower leg with unspecified severity: Secondary | ICD-10-CM | POA: Insufficient documentation

## 2013-12-13 DIAGNOSIS — E1169 Type 2 diabetes mellitus with other specified complication: Secondary | ICD-10-CM | POA: Insufficient documentation

## 2013-12-20 ENCOUNTER — Ambulatory Visit (INDEPENDENT_AMBULATORY_CARE_PROVIDER_SITE_OTHER): Payer: Medicare Other

## 2013-12-20 VITALS — BP 143/65 | HR 77 | Resp 16

## 2013-12-20 DIAGNOSIS — E1142 Type 2 diabetes mellitus with diabetic polyneuropathy: Secondary | ICD-10-CM

## 2013-12-20 DIAGNOSIS — I739 Peripheral vascular disease, unspecified: Secondary | ICD-10-CM

## 2013-12-20 DIAGNOSIS — E1149 Type 2 diabetes mellitus with other diabetic neurological complication: Secondary | ICD-10-CM

## 2013-12-20 DIAGNOSIS — B351 Tinea unguium: Secondary | ICD-10-CM

## 2013-12-20 DIAGNOSIS — M79609 Pain in unspecified limb: Secondary | ICD-10-CM

## 2013-12-20 DIAGNOSIS — R269 Unspecified abnormalities of gait and mobility: Secondary | ICD-10-CM

## 2013-12-20 DIAGNOSIS — E1140 Type 2 diabetes mellitus with diabetic neuropathy, unspecified: Secondary | ICD-10-CM

## 2013-12-20 NOTE — Progress Notes (Signed)
   Subjective:    Patient ID: [name] [name], male    DOB: [birth year], 80 y.o.   MRN: [id number]  HPI Comments: "Cut the toenails and just check the sores on his leg. His feet and legs have been swollen too"  Ulcerated areas - right leg      Review of Systems  Constitutional: Positive for activity change.  HENT: Negative.   Eyes: Negative.   Respiratory: Negative.   Cardiovascular: Positive for leg swelling.  Gastrointestinal: Negative.   Endocrine: Negative.   Genitourinary: Negative.   Musculoskeletal: Positive for arthralgias, gait problem and joint swelling.  Skin: Positive for wound.  Allergic/Immunologic: Negative.   Neurological: Positive for weakness and numbness.  Hematological: Negative.   Psychiatric/Behavioral: Positive for confusion.       Objective:   Physical Exam Lower extremity objective findings as follows vascular status is intact although diminished pedal pulses thready DP plus one over 4 bilateral PT nonpalpable bilateral absent hair growth and skin texture cool temperature patient is multiple wounds of both lower extremities consistent with NLD. Patient is multiple wounds using the wound Center for regular care and dressing changes Silvadene gauze dressings were reapplied at this time following inspection the wound sites of his ankle right ankle in particular. Nails thick brittle crumbly friable discolored consistent with onychomycosis and proptosis of nails also with some friability down to the nailbeds of the hallux nails bilateral. There is also some hemorrhage a keratoses and distal clavus fifth digit left which is debrided there is no open wounds or ulcerations noted current time and the toe of the nailbeds may be ulcer on the hallux. Orthopedic biomechanical exam rigid digital contractures patient ambulating without socks for shoes actually and socks but no shoes at this time and was unaware the night issues with seem somewhat confused and disoriented this time  as well patient appears to be well-developed although disoriented and not consistently wear was going on around him and his situation.      Assessment & Plan:  Assessment this time his diabetes with peripheral neuropathy and angiopathy and vascular compromise at this time the ulcer sites are redress continue with wound care center for ulcer care at this time nails 1 through 5 bilateral debrided presence of diabetes as well as onychomycosis and friability of nails. Return for future palliative care every 3 months or an as-needed basis for consisting care stressed the importance of shoes or slippers and socks at all times not to go barefoot patient is a high risk for limb loss due to the complications return in 3 months for continued palliative care  Harriet Masson DPM

## 2013-12-20 NOTE — Patient Instructions (Signed)
Diabetes and Foot Care Diabetes may cause you to have problems because of poor blood supply (circulation) to your feet and legs. This may cause the skin on your feet to become thinner, break easier, and heal more slowly. Your skin may become dry, and the skin may peel and crack. You may also have nerve damage in your legs and feet causing decreased feeling in them. You may not notice minor injuries to your feet that could lead to infections or more serious problems. Taking care of your feet is one of the most important things you can do for yourself.  HOME CARE INSTRUCTIONS  Wear shoes at all times, even in the house. Do not go barefoot. Bare feet are easily injured.  Check your feet daily for blisters, cuts, and redness. If you cannot see the bottom of your feet, use a mirror or ask someone for help.  Wash your feet with warm water (do not use hot water) and mild soap. Then pat your feet and the areas between your toes until they are completely dry. Do not soak your feet as this can dry your skin.  Apply a moisturizing lotion or petroleum jelly (that does not contain alcohol and is unscented) to the skin on your feet and to dry, brittle toenails. Do not apply lotion between your toes.  Trim your toenails straight across. Do not dig under them or around the cuticle. File the edges of your nails with an emery board or nail file.  Do not cut corns or calluses or try to remove them with medicine.  Wear clean socks or stockings every day. Make sure they are not too tight. Do not wear knee-high stockings since they may decrease blood flow to your legs.  Wear shoes that fit properly and have enough cushioning. To break in new shoes, wear them for just a few hours a day. This prevents you from injuring your feet. Always look in your shoes before you put them on to be sure there are no objects inside.  Do not cross your legs. This may decrease the blood flow to your feet.  If you find a minor scrape,  cut, or break in the skin on your feet, keep it and the skin around it clean and dry. These areas may be cleansed with mild soap and water. Do not cleanse the area with peroxide, alcohol, or iodine.  When you remove an adhesive bandage, be sure not to damage the skin around it.  If you have a wound, look at it several times a day to make sure it is healing.  Do not use heating pads or hot water bottles. They may burn your skin. If you have lost feeling in your feet or legs, you may not know it is happening until it is too late.  Make sure your health care provider performs a complete foot exam at least annually or more often if you have foot problems. Report any cuts, sores, or bruises to your health care provider immediately. SEEK MEDICAL CARE IF:   You have an injury that is not healing.  You have cuts or breaks in the skin.  You have an ingrown nail.  You notice redness on your legs or feet.  You feel burning or tingling in your legs or feet.  You have pain or cramps in your legs and feet.  Your legs or feet are numb.  Your feet always feel cold. SEEK IMMEDIATE MEDICAL CARE IF:   There is increasing redness,   swelling, or pain in or around a wound.  There is a red line that goes up your leg.  Pus is coming from a wound.  You develop a fever or as directed by your health care provider.  You notice a bad smell coming from an ulcer or wound. Document Released: 07/29/2000 Document Revised: 04/03/2013 Document Reviewed: 01/08/2013 Exit[employer] Patient Information 2014 [employer].  

## 2013-12-27 ENCOUNTER — Other Ambulatory Visit: Payer: Self-pay | Admitting: Internal Medicine

## 2013-12-29 ENCOUNTER — Emergency Department (HOSPITAL_COMMUNITY): Payer: Medicare Other

## 2013-12-29 ENCOUNTER — Encounter (HOSPITAL_COMMUNITY): Payer: Self-pay | Admitting: Emergency Medicine

## 2013-12-29 ENCOUNTER — Inpatient Hospital Stay (HOSPITAL_COMMUNITY)
Admission: EM | Admit: 2013-12-29 | Discharge: 2014-01-08 | DRG: 208 | Disposition: A | Discharge status: Skilled Nursing Facility | Payer: Medicare Other | Attending: Internal Medicine | Admitting: Internal Medicine

## 2013-12-29 ENCOUNTER — Inpatient Hospital Stay (HOSPITAL_COMMUNITY): Payer: Medicare Other

## 2013-12-29 DIAGNOSIS — Z6828 Body mass index (BMI) 28.0-28.9, adult: Secondary | ICD-10-CM

## 2013-12-29 DIAGNOSIS — E875 Hyperkalemia: Secondary | ICD-10-CM | POA: Diagnosis present

## 2013-12-29 DIAGNOSIS — I129 Hypertensive chronic kidney disease with stage 1 through stage 4 chronic kidney disease, or unspecified chronic kidney disease: Secondary | ICD-10-CM | POA: Diagnosis present

## 2013-12-29 DIAGNOSIS — M129 Arthropathy, unspecified: Secondary | ICD-10-CM | POA: Diagnosis present

## 2013-12-29 DIAGNOSIS — J123 Human metapneumovirus pneumonia: Secondary | ICD-10-CM

## 2013-12-29 DIAGNOSIS — IMO0002 Reserved for concepts with insufficient information to code with codable children: Secondary | ICD-10-CM

## 2013-12-29 DIAGNOSIS — D649 Anemia, unspecified: Secondary | ICD-10-CM | POA: Diagnosis present

## 2013-12-29 DIAGNOSIS — J1289 Other viral pneumonia: Principal | ICD-10-CM | POA: Diagnosis present

## 2013-12-29 DIAGNOSIS — I509 Heart failure, unspecified: Secondary | ICD-10-CM | POA: Diagnosis present

## 2013-12-29 DIAGNOSIS — R911 Solitary pulmonary nodule: Secondary | ICD-10-CM

## 2013-12-29 DIAGNOSIS — J449 Chronic obstructive pulmonary disease, unspecified: Secondary | ICD-10-CM

## 2013-12-29 DIAGNOSIS — IMO0001 Reserved for inherently not codable concepts without codable children: Secondary | ICD-10-CM

## 2013-12-29 DIAGNOSIS — E876 Hypokalemia: Secondary | ICD-10-CM

## 2013-12-29 DIAGNOSIS — Z833 Family history of diabetes mellitus: Secondary | ICD-10-CM

## 2013-12-29 DIAGNOSIS — J90 Pleural effusion, not elsewhere classified: Secondary | ICD-10-CM | POA: Diagnosis present

## 2013-12-29 DIAGNOSIS — J811 Chronic pulmonary edema: Secondary | ICD-10-CM | POA: Diagnosis present

## 2013-12-29 DIAGNOSIS — Z87891 Personal history of nicotine dependence: Secondary | ICD-10-CM

## 2013-12-29 DIAGNOSIS — E785 Hyperlipidemia, unspecified: Secondary | ICD-10-CM | POA: Diagnosis present

## 2013-12-29 DIAGNOSIS — E871 Hypo-osmolality and hyponatremia: Secondary | ICD-10-CM

## 2013-12-29 DIAGNOSIS — N183 Chronic kidney disease, stage 3 unspecified: Secondary | ICD-10-CM

## 2013-12-29 DIAGNOSIS — N179 Acute kidney failure, unspecified: Secondary | ICD-10-CM

## 2013-12-29 DIAGNOSIS — R600 Localized edema: Secondary | ICD-10-CM

## 2013-12-29 DIAGNOSIS — E860 Dehydration: Secondary | ICD-10-CM

## 2013-12-29 DIAGNOSIS — E1169 Type 2 diabetes mellitus with other specified complication: Secondary | ICD-10-CM | POA: Diagnosis present

## 2013-12-29 DIAGNOSIS — R0600 Dyspnea, unspecified: Secondary | ICD-10-CM

## 2013-12-29 DIAGNOSIS — Z66 Do not resuscitate: Secondary | ICD-10-CM | POA: Diagnosis not present

## 2013-12-29 DIAGNOSIS — I2789 Other specified pulmonary heart diseases: Secondary | ICD-10-CM | POA: Diagnosis present

## 2013-12-29 DIAGNOSIS — E669 Obesity, unspecified: Secondary | ICD-10-CM | POA: Diagnosis present

## 2013-12-29 DIAGNOSIS — W19XXXA Unspecified fall, initial encounter: Secondary | ICD-10-CM

## 2013-12-29 DIAGNOSIS — N189 Chronic kidney disease, unspecified: Secondary | ICD-10-CM | POA: Diagnosis present

## 2013-12-29 DIAGNOSIS — Z7982 Long term (current) use of aspirin: Secondary | ICD-10-CM

## 2013-12-29 DIAGNOSIS — I10 Essential (primary) hypertension: Secondary | ICD-10-CM

## 2013-12-29 DIAGNOSIS — E162 Hypoglycemia, unspecified: Secondary | ICD-10-CM

## 2013-12-29 DIAGNOSIS — J8410 Pulmonary fibrosis, unspecified: Secondary | ICD-10-CM | POA: Diagnosis present

## 2013-12-29 DIAGNOSIS — I2510 Atherosclerotic heart disease of native coronary artery without angina pectoris: Secondary | ICD-10-CM | POA: Diagnosis present

## 2013-12-29 DIAGNOSIS — E1165 Type 2 diabetes mellitus with hyperglycemia: Secondary | ICD-10-CM

## 2013-12-29 DIAGNOSIS — J9600 Acute respiratory failure, unspecified whether with hypoxia or hypercapnia: Secondary | ICD-10-CM

## 2013-12-29 DIAGNOSIS — L988 Other specified disorders of the skin and subcutaneous tissue: Secondary | ICD-10-CM | POA: Diagnosis present

## 2013-12-29 DIAGNOSIS — G9340 Encephalopathy, unspecified: Secondary | ICD-10-CM | POA: Diagnosis not present

## 2013-12-29 DIAGNOSIS — I5033 Acute on chronic diastolic (congestive) heart failure: Secondary | ICD-10-CM | POA: Diagnosis present

## 2013-12-29 DIAGNOSIS — J411 Mucopurulent chronic bronchitis: Secondary | ICD-10-CM | POA: Diagnosis present

## 2013-12-29 DIAGNOSIS — N289 Disorder of kidney and ureter, unspecified: Secondary | ICD-10-CM | POA: Diagnosis present

## 2013-12-29 DIAGNOSIS — J9620 Acute and chronic respiratory failure, unspecified whether with hypoxia or hypercapnia: Secondary | ICD-10-CM | POA: Diagnosis present

## 2013-12-29 LAB — COMPREHENSIVE METABOLIC PANEL
ALT: 45 U/L (ref 0–53)
AST: 80 U/L — AB (ref 0–37)
Albumin: 3.4 g/dL — ABNORMAL LOW (ref 3.5–5.2)
Alkaline Phosphatase: 40 U/L (ref 39–117)
BILIRUBIN TOTAL: 2.3 mg/dL — AB (ref 0.3–1.2)
BUN: 22 mg/dL (ref 6–23)
CHLORIDE: 98 meq/L (ref 96–112)
CO2: 20 mEq/L (ref 19–32)
CREATININE: 1.55 mg/dL — AB (ref 0.50–1.35)
Calcium: 8.6 mg/dL (ref 8.4–10.5)
GFR calc Af Amer: 47 mL/min — ABNORMAL LOW (ref >90)
GFR calc non Af Amer: 40 mL/min — ABNORMAL LOW (ref >90)
Glucose, Bld: 83 mg/dL (ref 70–99)
Potassium: 3.9 mEq/L (ref 3.7–5.3)
SODIUM: 135 meq/L — AB (ref 137–147)
Total Protein: 7.1 g/dL (ref 6.0–8.3)

## 2013-12-29 LAB — MRSA PCR SCREENING: MRSA by PCR: NEGATIVE

## 2013-12-29 LAB — CBC WITH DIFFERENTIAL/PLATELET
BASOS ABS: 0 10*3/uL (ref 0.0–0.1)
Basophils Relative: 1 % (ref 0–1)
Eosinophils Absolute: 0.2 10*3/uL (ref 0.0–0.7)
Eosinophils Relative: 3 % (ref 0–5)
HEMATOCRIT: 38.1 % — AB (ref 39.0–52.0)
Hemoglobin: 12.8 g/dL — ABNORMAL LOW (ref 13.0–17.0)
LYMPHS PCT: 15 % (ref 12–46)
Lymphs Abs: 0.9 10*3/uL (ref 0.7–4.0)
MCH: 32.5 pg (ref 26.0–34.0)
MCHC: 33.6 g/dL (ref 30.0–36.0)
MCV: 96.7 fL (ref 78.0–100.0)
MONO ABS: 1.1 10*3/uL — AB (ref 0.1–1.0)
Monocytes Relative: 17 % — ABNORMAL HIGH (ref 3–12)
NEUTROS ABS: 3.9 10*3/uL (ref 1.7–7.7)
NEUTROS PCT: 64 % (ref 43–77)
Platelets: 215 10*3/uL (ref 150–400)
RBC: 3.94 MIL/uL — ABNORMAL LOW (ref 4.22–5.81)
RDW: 14.7 % (ref 11.5–15.5)
WBC: 6 10*3/uL (ref 4.0–10.5)

## 2013-12-29 LAB — GLUCOSE, CAPILLARY: GLUCOSE-CAPILLARY: 86 mg/dL (ref 70–99)

## 2013-12-29 LAB — TRIGLYCERIDES: TRIGLYCERIDES: 78 mg/dL (ref <150)

## 2013-12-29 LAB — I-STAT ARTERIAL BLOOD GAS, ED
Acid-base deficit: 2 mmol/L (ref 0.0–2.0)
BICARBONATE: 23.8 meq/L (ref 20.0–24.0)
O2 Saturation: 99 %
TCO2: 25 mmol/L (ref 0–100)
pCO2 arterial: 43.8 mmHg (ref 35.0–45.0)
pH, Arterial: 7.342 — ABNORMAL LOW (ref 7.350–7.450)
pO2, Arterial: 129 mmHg — ABNORMAL HIGH (ref 80.0–100.0)

## 2013-12-29 LAB — PROCALCITONIN: Procalcitonin: <0.1 ng/mL

## 2013-12-29 LAB — I-STAT TROPONIN, ED: Troponin i, poc: 0.03 ng/mL (ref 0.00–0.08)

## 2013-12-29 LAB — CBC
HCT: 37.9 % — ABNORMAL LOW (ref 39.0–52.0)
Hemoglobin: 12.8 g/dL — ABNORMAL LOW (ref 13.0–17.0)
MCH: 32.8 pg (ref 26.0–34.0)
MCHC: 33.8 g/dL (ref 30.0–36.0)
MCV: 97.2 fL (ref 78.0–100.0)
PLATELETS: 211 10*3/uL (ref 150–400)
RBC: 3.9 MIL/uL — AB (ref 4.22–5.81)
RDW: 15 % (ref 11.5–15.5)
WBC: 5.9 10*3/uL (ref 4.0–10.5)

## 2013-12-29 LAB — CREATININE, SERUM
CREATININE: 1.6 mg/dL — AB (ref 0.50–1.35)
GFR calc Af Amer: 45 mL/min — ABNORMAL LOW (ref >90)
GFR calc non Af Amer: 39 mL/min — ABNORMAL LOW (ref >90)

## 2013-12-29 LAB — TROPONIN I

## 2013-12-29 LAB — PRO B NATRIURETIC PEPTIDE: Pro B Natriuretic peptide (BNP): 17582 pg/mL — ABNORMAL HIGH (ref 0–450)

## 2013-12-29 LAB — I-STAT CG4 LACTIC ACID, ED: Lactic Acid, Venous: 2.67 mmol/L — ABNORMAL HIGH (ref 0.5–2.2)

## 2013-12-29 MED ORDER — PROPOFOL 10 MG/ML IV EMUL
0.0000 ug/kg/min | INTRAVENOUS | Status: DC
  Administered 2013-12-29: 40 ug/kg/min via INTRAVENOUS
  Administered 2013-12-30: 30 ug/kg/min via INTRAVENOUS
  Administered 2013-12-30: 40 ug/kg/min via INTRAVENOUS
  Administered 2013-12-30: 20 ug/kg/min via INTRAVENOUS
  Administered 2013-12-30: 30 ug/kg/min via INTRAVENOUS
  Administered 2013-12-31: 20 ug/kg/min via INTRAVENOUS
  Filled 2013-12-29 (×6): qty 100

## 2013-12-29 MED ORDER — LIDOCAINE HCL (CARDIAC) 20 MG/ML IV SOLN
INTRAVENOUS | Status: AC
  Filled 2013-12-29: qty 5

## 2013-12-29 MED ORDER — SODIUM CHLORIDE 0.9 % IJ SOLN: 3.0000 mL | INTRAMUSCULAR | Status: DC | PRN

## 2013-12-29 MED ORDER — ALBUTEROL SULFATE (2.5 MG/3ML) 0.083% IN NEBU
2.5000 mg | INHALATION_SOLUTION | RESPIRATORY_TRACT | Status: DC | PRN
  Administered 2013-12-31: 2.5 mg via RESPIRATORY_TRACT
  Filled 2013-12-29: qty 3

## 2013-12-29 MED ORDER — SODIUM CHLORIDE 0.9 % IV SOLN
250.0000 mL | INTRAVENOUS | Status: DC | PRN
  Administered 2013-12-29: 250 mL via INTRAVENOUS

## 2013-12-29 MED ORDER — ETOMIDATE 2 MG/ML IV SOLN
INTRAVENOUS | Status: AC
  Administered 2013-12-29: 30 mg
  Filled 2013-12-29: qty 20

## 2013-12-29 MED ORDER — SODIUM CHLORIDE 0.9 % IJ SOLN
3.0000 mL | Freq: Two times a day (BID) | INTRAMUSCULAR | Status: DC
  Administered 2013-12-29 – 2014-01-07 (×15): 3 mL via INTRAVENOUS

## 2013-12-29 MED ORDER — SODIUM CHLORIDE 0.9 % IV BOLUS (SEPSIS)
500.0000 mL | Freq: Once | INTRAVENOUS | Status: AC
  Administered 2013-12-29: 500 mL via INTRAVENOUS

## 2013-12-29 MED ORDER — ROCURONIUM BROMIDE 50 MG/5ML IV SOLN
INTRAVENOUS | Status: AC
  Administered 2013-12-29: 10 mg
  Filled 2013-12-29: qty 2

## 2013-12-29 MED ORDER — HEPARIN SODIUM (PORCINE) 5000 UNIT/ML IJ SOLN
5000.0000 [IU] | Freq: Three times a day (TID) | INTRAMUSCULAR | Status: DC
  Administered 2013-12-29 – 2014-01-08 (×28): 5000 [IU] via SUBCUTANEOUS
  Filled 2013-12-29 (×33): qty 1

## 2013-12-29 MED ORDER — ACETAMINOPHEN 650 MG RE SUPP
650.0000 mg | Freq: Once | RECTAL | Status: AC
  Administered 2013-12-29: 650 mg via RECTAL
  Filled 2013-12-29: qty 1

## 2013-12-29 MED ORDER — FUROSEMIDE 10 MG/ML IJ SOLN
40.0000 mg | Freq: Three times a day (TID) | INTRAMUSCULAR | Status: AC
  Administered 2013-12-29 – 2013-12-30 (×4): 40 mg via INTRAVENOUS
  Filled 2013-12-29 (×5): qty 4

## 2013-12-29 MED ORDER — PIPERACILLIN-TAZOBACTAM 3.375 G IVPB 30 MIN
3.3750 g | Freq: Once | INTRAVENOUS | Status: AC
  Administered 2013-12-29: 3.375 g via INTRAVENOUS
  Filled 2013-12-29: qty 50

## 2013-12-29 MED ORDER — ASPIRIN 300 MG RE SUPP: 300.0000 mg | RECTAL | Status: DC

## 2013-12-29 MED ORDER — PANTOPRAZOLE SODIUM 40 MG IV SOLR
40.0000 mg | Freq: Every day | INTRAVENOUS | Status: DC
  Administered 2013-12-29 – 2014-01-02 (×5): 40 mg via INTRAVENOUS
  Filled 2013-12-29 (×6): qty 40

## 2013-12-29 MED ORDER — VANCOMYCIN HCL 10 G IV SOLR
2000.0000 mg | Freq: Once | INTRAVENOUS | Status: AC
  Administered 2013-12-29: 2000 mg via INTRAVENOUS
  Filled 2013-12-29: qty 2000

## 2013-12-29 MED ORDER — SODIUM CHLORIDE 0.9 % IV BOLUS (SEPSIS): 1000.0000 mL | Freq: Once | INTRAVENOUS | Status: DC

## 2013-12-29 MED ORDER — SUCCINYLCHOLINE CHLORIDE 20 MG/ML IJ SOLN
INTRAMUSCULAR | Status: AC
  Filled 2013-12-29: qty 1

## 2013-12-29 MED ORDER — FUROSEMIDE 10 MG/ML IJ SOLN: 40.0000 mg | Freq: Once | INTRAMUSCULAR | Status: DC

## 2013-12-29 MED ORDER — IPRATROPIUM-ALBUTEROL 0.5-2.5 (3) MG/3ML IN SOLN
3.0000 mL | RESPIRATORY_TRACT | Status: DC
  Administered 2013-12-29 – 2013-12-31 (×9): 3 mL via RESPIRATORY_TRACT
  Filled 2013-12-29 (×12): qty 3

## 2013-12-29 MED ORDER — HYDRALAZINE HCL 20 MG/ML IJ SOLN: 10.0000 mg | INTRAMUSCULAR | Status: DC | PRN

## 2013-12-29 MED ORDER — INSULIN ASPART 100 UNIT/ML ~~LOC~~ SOLN: 2.0000 [IU] | SUBCUTANEOUS | Status: DC

## 2013-12-29 MED ORDER — ASPIRIN 81 MG PO CHEW: 324.0000 mg | CHEWABLE_TABLET | ORAL | Status: DC

## 2013-12-29 MED ORDER — CHLORHEXIDINE GLUCONATE 0.12 % MT SOLN
15.0000 mL | Freq: Two times a day (BID) | OROMUCOSAL | Status: DC
  Administered 2013-12-29 – 2014-01-07 (×18): 15 mL via OROMUCOSAL
  Filled 2013-12-29 (×21): qty 15

## 2013-12-29 MED ORDER — HEPARIN SODIUM (PORCINE) 5000 UNIT/ML IJ SOLN
5000.0000 [IU] | Freq: Three times a day (TID) | INTRAMUSCULAR | Status: DC
  Filled 2013-12-29 (×2): qty 1

## 2013-12-29 MED ORDER — VANCOMYCIN HCL IN DEXTROSE 750-5 MG/150ML-% IV SOLN
750.0000 mg | Freq: Two times a day (BID) | INTRAVENOUS | Status: DC
  Administered 2013-12-30 – 2013-12-31 (×3): 750 mg via INTRAVENOUS
  Filled 2013-12-29 (×4): qty 150

## 2013-12-29 MED ORDER — FENTANYL CITRATE 0.05 MG/ML IJ SOLN
50.0000 ug | INTRAMUSCULAR | Status: DC | PRN
  Administered 2013-12-29 – 2013-12-31 (×6): 50 ug via INTRAVENOUS
  Filled 2013-12-29 (×6): qty 2

## 2013-12-29 MED ORDER — PROPOFOL 10 MG/ML IV EMUL
INTRAVENOUS | Status: AC
  Administered 2013-12-29: 1000 mg
  Filled 2013-12-29: qty 100

## 2013-12-29 MED ORDER — ACETAMINOPHEN 325 MG PO TABS: 650.0000 mg | ORAL_TABLET | Freq: Four times a day (QID) | ORAL | Status: DC | PRN

## 2013-12-29 MED ORDER — PIPERACILLIN-TAZOBACTAM 3.375 G IVPB
3.3750 g | Freq: Three times a day (TID) | INTRAVENOUS | Status: DC
  Administered 2013-12-29 – 2013-12-31 (×5): 3.375 g via INTRAVENOUS
  Filled 2013-12-29 (×7): qty 50

## 2013-12-29 NOTE — ED Notes (Signed)
og  Placed by [facility] and connected to suction small amount of blood tinged drainage

## 2013-12-29 NOTE — Progress Notes (Addendum)
ANTIBIOTIC CONSULT NOTE - INITIAL  Pharmacy Consult for vancomycin and zosyn Indication: HCAP  No Known Allergies  Patient Measurements: Height: 5\' 9"  (175.3 cm) Weight: 200 lb (90.719 kg) IBW/kg (Calculated) : 70.7   Vital Signs: Temp: 99.9 F (37.7 C) (05/17 1732) Temp src: Rectal (05/17 1732) BP: 157/69 mmHg (05/17 1732) Pulse Rate: 119 (05/17 1732) Intake/Output from previous day:   Intake/Output from this shift: Total I/O In: 800 [I.V.:800] Out: -   Labs:  Recent Labs  12/29/13 1544  WBC 6.0  HGB 12.8*  PLT 215  CREATININE 1.55*   Estimated Creatinine Clearance: 41.6 ml/min (by C-G formula based on Cr of 1.55). No results found for this basename: VANCOTROUGH, VANCOPEAK, VANCORANDOM, GENTTROUGH, GENTPEAK, GENTRANDOM, TOBRATROUGH, TOBRAPEAK, TOBRARND, AMIKACINPEAK, AMIKACINTROU, AMIKACIN,  in the last 72 hours   Microbiology: No results found for this or any previous visit (from the past 720 hour(s)).  Medical History: Past Medical History  Diagnosis Date  . Hypertension   . Shortness of breath     with exertion  . Diabetes mellitus     does not check his blood sugar at home  . Arthritis   . CHF (congestive heart failure)   . COPD (chronic obstructive pulmonary disease)   . Hyperlipidemia   . Chronic kidney disease     renal insuficiency  . Coronary artery disease    Assessment: 80 y.o. male history of CHF, chronic kidney disease, diabetes mellitus, hypertension was brought to the ER because of acute resp distress , attempted CPAP en route, unable to tolerate. Progressively worse in ER , requiring intubation. Patient was given empiric doses of vancomycin and zosyn in ED earlier, pharmacy now consulted for continuation orders. Tmax of 103.9 noted earlier and wbc normal at 6. Cultures appear to have been drawn. Scr today was 1.5 and is usually around 1.8-2.    Goal of Therapy:  Vancomycin trough level 15-20 mcg/ml  Plan:  Measure antibiotic drug  levels at steady state Follow up culture results Continue zosyn 3.375g IV q8 hours - infuse each dose over 4 hours Continue vancomycin at 750mg  IV q12 hours - next dose tomorrow morning Follow up renal function for changes in am  Erin Hearing PharmD., BCPS Clinical Pharmacist Pager [phone] 12/29/2013 5:54 PM

## 2013-12-29 NOTE — H&P (Signed)
PULMONARY / CRITICAL CARE MEDICINE   Name: [name] [name] MRN: [id number] DOB: 1931-03-20   PCP [name], MD Cardiology: Dr Adrian Prows Pulmonary: Dr Ann Lions  ADMISSION DATE:  12/29/2013   REFERRING MD :  EDP PRIMARY SERVICE: PCCM   CHIEF COMPLAINT:  Acute Resp Failure   BRIEF PATIENT DESCRIPTION:  [name] [name] is a 80 y.o. male history of CHF, chronic kidney disease, diabetes mellitus, hypertension, diastolich CHF,  was brought to the ER because of increasing lower extremity edema and pain. Patient is a poor historian and most of the history is obtained from previous charts ER physician and patient's niece.   Patient also has been having shortness of breath on exertion over the last few months and was admitted last October 2014 for CHF and at that time patient had stress Myoview which was showing low risk and 2-D echo was showing EF of 55% with severe pulmonary hypertension. Patient eventually has been following up with pulmonologist in March 2015  for shortness of breath and CAT scan done in December were showing possible interstitial lung disease and a repeat CAT scan has been ordered by his pulmonologist in March 2015 but his non compliance has interefered with that plan. Patient has been recently placed on prednisone unclear by whom and why and duration. Initial exam, denied any chest pain and states that he's been having some cough for last 3 weeks which was productive of sputum. His pain in his lower extremities had recently worsened with increasing swelling. It is not sure if patient has been taking his diuretics as advised. X-ray shows moderate right-sided pleural effusion with possible atelectasis versus infiltrates. Patient in the ER has been started on Lasix 40 mg IV and admitted for CHF exacerbation.  Patient otherwise denies any nausea vomiting abdominal pain diarrhea or any focal deficits.   SIGNIFICANT EVENTS / STUDIES:  5/17 CT chest >> 5/17 2 D echo >>  LINES /  TUBES: 5/17 ETT >>  CULTURES: 5/17 BC x  2 >> 5/17 UC >> 5/17 Sputum >>  ANTIBIOTICS: 5/17 Vanc >> 5/17 Zosyn >>  HISTORY OF PRESENT ILLNESS:   [name] [name] is a 80 y.o. male history of CHF, chronic kidney disease, diabetes mellitus, hypertension was brought to the ER because of acute resp distress , attempted CPAP en route, unable to tolerate. Progressively worse in ER , requiring intubation.  CXR shows severe right sided  aspdz , temp 103.9 . Pt has several LE wounds and weeping areas. Pt was started on Vanc and Zosyn . Currently sedated on vent .  Prior to intubation was following commands but agitated according to RN.   Followed by [name] in office , CT chest showed ILD changes . Recent admit 10/2013 for acute CHF and CAP -RLL  Oct 05/2013 stress Myoview which was showing low risk and 2-D echo was showing EF of 55% with severe pulmonary hypertension.    PAST MEDICAL HISTORY :  Past Medical History  Diagnosis Date  . Hypertension   . Shortness of breath     with exertion  . Diabetes mellitus     does not check his blood sugar at home  . Arthritis   . CHF (congestive heart failure)   . COPD (chronic obstructive pulmonary disease)   . Hyperlipidemia   . Chronic kidney disease     renal insuficiency  . Coronary artery disease    Past Surgical History  Procedure Laterality Date  . Leg surgery  both legs - unsure of exact operations - from trauma  . Tonsillectomy    . Appendectomy    . Cataract extraction w/phaco Left 09/04/2013    Procedure: CATARACT EXTRACTION PHACO AND INTRAOCULAR LENS PLACEMENT (IOC);  Surgeon: Adonis Brook, MD;  Location: [facility];  Service: Ophthalmology;  Laterality: Left;   Prior to Admission medications   Medication Sig Start Date End Date Taking? Authorizing Provider  albuterol (PROVENTIL) (2.5 MG/3ML) 0.083% nebulizer solution Take 3 mLs (2.5 mg total) by nebulization daily. 09/19/13   Deno Etienne, MD  amLODipine (NORVASC) 5 MG tablet  Take 5 mg by mouth daily.    Historical Provider, MD  aspirin EC 81 MG tablet Take 81 mg by mouth daily.    Historical Provider, MD  fenofibrate 160 MG tablet Take 160 mg by mouth daily.    Historical Provider, MD  levofloxacin (LEVAQUIN) 750 MG tablet Take 1 tablet (750 mg total) by mouth every other day. 11/09/13   Maryann Mikhail, DO  linagliptin (TRADJENTA) 5 MG TABS tablet Take 5 mg by mouth daily. 01/20/13   Verlee Monte, MD  lisinopril (ZESTRIL) 2.5 MG tablet Take 1 tablet (2.5 mg total) by mouth daily. 03/28/13   Erline Hau, MD  metoprolol succinate (TOPROL-XL) 25 MG 24 hr tablet Take 25 mg by mouth daily. 07/04/13   Historical Provider, MD  prednisoLONE acetate (PRED FORTE) 1 % ophthalmic suspension Place 1 drop into both eyes at bedtime.  09/11/13   Historical Provider, MD  repaglinide (PRANDIN) 0.5 MG tablet Take 1 mg by mouth 2 (two) times daily before a meal.  07/04/13   Historical Provider, MD  torsemide (DEMADEX) 20 MG tablet TAKE 1 TABLET BY MOUTH TWICE DAILY 12/27/13   Brand Males, MD  TRAVATAN Z 0.004 % SOLN ophthalmic solution Place 1 drop into both eyes at bedtime.  07/04/13   Historical Provider, MD   No Known Allergies  FAMILY HISTORY:  Family History  Problem Relation Age of Onset  . Diabetes Father   . Diabetes Mother    SOCIAL HISTORY:  reports that he quit smoking about 4 years ago. His smoking use included Cigarettes. He has a 35 pack-year smoking history. He has never used smokeless tobacco. He reports that he does not drink alcohol or use illicit drugs.  REVIEW OF SYSTEMS: unable to obtain as sedated on vent   SUBJECTIVE: Resp Failure   VITAL SIGNS: Temp:  [103.9 F (39.9 C)] 103.9 F (39.9 C) (05/17 1545) Pulse Rate:  [105-113] 106 (05/17 1624) Resp:  [28-40] 40 (05/17 1600) BP: (124-162)/(72-129) 159/73 mmHg (05/17 1624) SpO2:  [92 %-100 %] 100 % (05/17 1624) FiO2 (%):  [100 %] 100 % (05/17 1600) Weight:  [90.719 kg (200 lb)] 90.719 kg  (200 lb) (05/17 1600) HEMODYNAMICS:   VENTILATOR SETTINGS: Vent Mode:  [-] PRVC FiO2 (%):  [100 %] 100 % Set Rate:  [16 bmp] 16 bmp PEEP:  [5 cmH20] 5 cmH20 Plateau Pressure:  [24 cmH20] 24 cmH20 INTAKE / OUTPUT: Intake/Output     05/16 0701 - 05/17 0700 05/17 0701 - 05/18 0700   I.V. (mL/kg)  800 (8.8)   Total Intake(mL/kg)  800 (8.8)   Net   +800          PHYSICAL EXAMINATION: General:  Sedated on vent  Neuro:  Sedated on diprivan, RASS -3 HEENT: ETT in place  Cardiovascular:  ST  Lungs:  Coarse Rhonchi noted  Abdomen:  Obese , soft , BS hypoactive  Musculoskeletal:  LE w/ old surgical scars.  Skin:  Multiple excoriations w/ weeping along LE   LABS: PULMONARY  Recent Labs Lab 12/29/13 1700  PHART 7.342*  PCO2ART 43.8  PO2ART 129.0*  HCO3 23.8  TCO2 25  O2SAT 99.0    CBC  Recent Labs Lab 12/29/13 1544  HGB 12.8*  HCT 38.1*  WBC 6.0  PLT 215    COAGULATION No results found for this basename: INR,  in the last 168 hours  CARDIAC  No results found for this basename: TROPONINI,  in the last 168 hours  Recent Labs Lab 12/29/13 1544  PROBNP 17582.0*     CHEMISTRY  Recent Labs Lab 12/29/13 1544  NA 135*  K 3.9  CL 98  CO2 20  GLUCOSE 83  BUN 22  CREATININE 1.55*  CALCIUM 8.6   Estimated Creatinine Clearance: 41.6 ml/min (by C-G formula based on Cr of 1.55).   LIVER  Recent Labs Lab 12/29/13 1544  AST 80*  ALT 45  ALKPHOS 40  BILITOT 2.3*  PROT 7.1  ALBUMIN 3.4*     INFECTIOUS  Recent Labs Lab 12/29/13 1604  LATICACIDVEN 2.67*     ENDOCRINE CBG (last 3)  No results found for this basename: GLUCAP,  in the last 72 hours       IMAGING x48h  Dg Chest Portable 1 View  12/29/2013   CLINICAL DATA:  Intubation and short of breath  EXAM: PORTABLE CHEST - 1 VIEW  COMPARISON:  12/29/2013  FINDINGS: Endotracheal tube in good position. NG tube enters the stomach with the tip not visualized.  Right lower lobe airspace  disease in right pleural effusion unchanged. Pulmonary vascular congestion. Possible mild edema.  IMPRESSION: Endotracheal tube in good position.  No change in right pleural effusion and right lower lobe atelectasis. Probable heart failure.   Electronically Signed   By: Franchot Gallo M.D.   On: 12/29/2013 16:19   Dg Chest Port 1 View  12/29/2013   CLINICAL DATA:  Shortness of breath, code sepsis  EXAM: PORTABLE CHEST - 1 VIEW  COMPARISON:  DG CHEST 1V PORT dated 11/04/2013  FINDINGS: Interval increase in now near complete opacification of the right hemi thorax by pleural effusion and underlying airspace consolidation is now noted. The left lung is grossly clear with prominence of the interstitial markings but no focal opacity. Trace left effusion is identified. Cardiomegaly persists. No acute osseous finding allowing for technique. Right AC joint degenerative change.  IMPRESSION: Increased, now near complete opacification of the right hemi thorax, with large pleural effusion and underlying airspace disease.   Electronically Signed   By: Conchita Paris M.D.   On: 12/29/2013 15:56      ASSESSMENT / PLAN:  PULMONARY A:  Baseline dyspnea with possible COPD and ILD (? UIP) concomitant disease  -  HRCT dec 2014 - possibly with UIP pattern but good read clouded by bilateral pleural effusion  - Likely associated cOPD as well  - PFT mixed picture Jan 2015  Currently, Acute Resp Failure in setting of underlying HCAP (recent [facility] 10/2013)  With ? Loculated Right Pleural effusion  I P:   Admit to ICU  Vent bundle  VAP  Tr ABG /CXR  Daily WUA/SBT  Check strep/leg ur  Resp viruse panel Check CT chest  Duoneb BD  Hold steroids for now   CARDIOVASCULAR A: Acute on chronic diastolic CHF -decompensated   HTN (on ace/norvasc/toprol/demadex pta)  -Echo in  2014 which showed EF  of 55% with severe pulmonary hypertension.  -BNP 17K , troponin neg  P:  Diuresis as scr/bp allow  Check bnp in am  Lasix  40mg  x q8h Check 2 D echo  Cycle enzumes    RENAL A: Chronic Renal Insufficiecy  P:   Monitor and replace electrolytes as indicated  I/O   GASTROINTESTINAL A:  LFT elevated  P:   PPI  Tr LFT   HEMATOLOGIC A:   P:  Hep for DVT prophylasixs  INFECTIOUS A:  R. HCAP  P:   Pan cx  Cont Vanc /Zosyn  Tr temp/wbc   ENDOCRINE A:  DM  P:   SSI   NEUROLOGIC A:  Sedated  P:   Sedation w/ Propofol and .fent As needed    TODAY'S SUMMARY: 80 yo admitted from home for Acute Resp failure w/ R. HCAP and decompensated CHF  . Begin IV lasix as tolerated.  CT chest rule out empyuema and 2 D echo pend.   GLOBAL - needs THN at dc; DR Allis Quirarte tried setting this up march 2015  Tammy S Parrett NP -C  Pulmonary and [place] Pager: [phone]  12/29/2013, 4:53 PM    STAFF NOTE: I, Dr Ann Lions have personally reviewed patient's available data, including medical history, events of note, physical examination and test results as part of my evaluation. I have discussed with resident/NP and other care providers such as pharmacist, RN and RRT.  In addition,  I personally evaluated patient and elicited key findings of  - patient known to me from spring 2015. Poor health literacy and compliance. Has recurrent admission for acute diastolic CHF including march 2015. His pulmonary status incudes likely COPD and possible ILD/UIP but he has never made it through workup phase. Currently admitted with Rt HCAP and acute resp failure and possible/likely co-existent heart failure NOS. Will get CT chest rule out empyema. Will get echo. Rule out MI. He need THN   Rest per NP/medical resident whose note is outlined above and that I agree with  The patient is critically ill with multiple organ systems failure and requires high complexity decision making for assessment and support, frequent evaluation and titration of therapies, application of advanced monitoring  technologies and extensive interpretation of multiple databases.   Critical Care Time devoted to patient care services described in this note is  35  Minutes.  Dr. Brand Males, M.D., [facility].C.P Pulmonary and Critical Care Medicine Staff Physician [place] [facility] Pager: [phone], If no answer or between  15:00h - 7:00h: call [phone]  12/29/2013 6:31 PM

## 2013-12-29 NOTE — ED Provider Notes (Signed)
CSN: [id number]     Arrival date & time 12/29/13  1520 History   First MD Initiated Contact with Patient 12/29/13 1525     Chief Complaint  Patient presents with  . Shortness of Breath   Patient is an 79 YOM who presents via EMS for severe SOB.  Patient is a poor historian but he reports that SOB has been worsening over the past 3 days. He denies cough, fever, chest pain, or other symptoms.  EMS noted severe RD and patient was started on CPAP which patient was not tolerating very well in transit.    (Consider location/radiation/quality/duration/timing/severity/associated sxs/prior Treatment) Patient is a 80 y.o. male presenting with shortness of breath. The history is provided by the patient, medical records and the EMS personnel. No language interpreter was used.  Shortness of Breath Severity:  Severe Onset quality:  Gradual Timing:  Constant Progression:  Worsening Chronicity:  Recurrent Relieved by:  Nothing Worsened by:  Exertion and movement   Past Medical History  Diagnosis Date  . Hypertension   . Shortness of breath     with exertion  . Diabetes mellitus     does not check his blood sugar at home  . Arthritis   . CHF (congestive heart failure)   . COPD (chronic obstructive pulmonary disease)   . Hyperlipidemia   . Chronic kidney disease     renal insuficiency   Past Surgical History  Procedure Laterality Date  . Leg surgery      both legs - unsure of exact operations - from trauma  . Tonsillectomy    . Appendectomy    . Cataract extraction w/phaco Left 09/04/2013    Procedure: CATARACT EXTRACTION PHACO AND INTRAOCULAR LENS PLACEMENT (IOC);  Surgeon: Adonis Brook, MD;  Location: [facility];  Service: Ophthalmology;  Laterality: Left;   Family History  Problem Relation Age of Onset  . Diabetes Father   . Diabetes Mother    History  Substance Use Topics  . Smoking status: Former Smoker -- 0.50 packs/day for 70 years    Types: Cigarettes    Quit date: 05/14/2009  .  Smokeless tobacco: Never Used  . Alcohol Use: No    Review of Systems  Respiratory: Positive for shortness of breath.   All other systems reviewed and are negative.     Allergies  Review of patient's allergies indicates no known allergies.  Home Medications   Prior to Admission medications   Medication Sig Start Date End Date Taking? Authorizing Provider  albuterol (PROVENTIL) (2.5 MG/3ML) 0.083% nebulizer solution Take 3 mLs (2.5 mg total) by nebulization daily. 09/19/13   Deno Etienne, MD  amLODipine (NORVASC) 5 MG tablet Take 5 mg by mouth daily.    Historical Provider, MD  aspirin EC 81 MG tablet Take 81 mg by mouth daily.    Historical Provider, MD  fenofibrate 160 MG tablet Take 160 mg by mouth daily.    Historical Provider, MD  levofloxacin (LEVAQUIN) 750 MG tablet Take 1 tablet (750 mg total) by mouth every other day. 11/09/13   Maryann Mikhail, DO  linagliptin (TRADJENTA) 5 MG TABS tablet Take 5 mg by mouth daily. 01/20/13   Verlee Monte, MD  lisinopril (ZESTRIL) 2.5 MG tablet Take 1 tablet (2.5 mg total) by mouth daily. 03/28/13   Erline Hau, MD  metoprolol succinate (TOPROL-XL) 25 MG 24 hr tablet Take 25 mg by mouth daily. 07/04/13   Historical Provider, MD  prednisoLONE acetate (PRED FORTE) 1 % ophthalmic  suspension Place 1 drop into both eyes at bedtime.  09/11/13   Historical Provider, MD  repaglinide (PRANDIN) 0.5 MG tablet Take 1 mg by mouth 2 (two) times daily before a meal.  07/04/13   Historical Provider, MD  torsemide (DEMADEX) 20 MG tablet TAKE 1 TABLET BY MOUTH TWICE DAILY 12/27/13   Brand Males, MD  TRAVATAN Z 0.004 % SOLN ophthalmic solution Place 1 drop into both eyes at bedtime.  07/04/13   Historical Provider, MD   There were no vitals taken for this visit. Physical Exam  Nursing note and vitals reviewed. Constitutional: He is oriented to person, place, and time. He appears well-developed and well-nourished.  HENT:  Head: Normocephalic and  atraumatic.  Eyes: Conjunctivae are normal. Pupils are equal, round, and reactive to light.  Neck: Normal range of motion. Neck supple.  Cardiovascular: Regular rhythm.  Tachycardia present.   Pulmonary/Chest: He is in respiratory distress. He exhibits no tenderness.  Diffuse rhonchi R>L  Abdominal: Soft. Bowel sounds are normal.  Musculoskeletal: Normal range of motion. He exhibits edema (to BLE with bullae present).  Neurological: He is alert and oriented to person, place, and time.  Skin: Skin is warm and dry.  Psychiatric: He has a normal mood and affect.    ED Course  INTUBATION Date/Time: 12/29/2013 4:06 PM Performed by: Corlis Leak Authorized by: Serita Grit DAVID III Consent: Verbal consent obtained. The procedure was performed in an emergent situation. Risks and benefits: risks, benefits and alternatives were discussed Consent given by: patient Patient identity confirmed: verbally with patient and arm band Time out: Immediately prior to procedure a "time out" was called to verify the correct patient, procedure, equipment, support staff and site/side marked as required. Indications: respiratory distress and  hypoxemia Intubation method: video-assisted Patient status: paralyzed (RSI) Preoxygenation: nonrebreather mask Sedatives: etomidate Paralytic: rocuronium Tube size: 7.5 mm Tube type: cuffed Number of attempts: 1 Cricoid pressure: yes Post-procedure assessment: chest rise and ETCO2 monitor Breath sounds: equal Cuff inflated: yes ETT to lip: 24 cm Tube secured with: ETT holder Chest x-ray interpreted by me, other physician and radiologist. Chest x-ray findings: endotracheal tube in appropriate position Patient tolerance: Patient tolerated the procedure well with no immediate complications.   (including critical care time) Labs Review Labs Reviewed - No data to display  Imaging Review No results found.   EKG Interpretation None       Date: 12/29/2013   Rate: 113  Rhythm: sinus tachycardia  QRS Axis: normal  Intervals: normal  ST/T Wave abnormalities: nonspecific ST changes  Conduction Disutrbances:none  Narrative Interpretation:   Old EKG Reviewed: unchanged and other than ST    MDM   Final diagnoses:  Acute respiratory failure  Acute on chronic diastolic CHF (congestive heart failure)  Pleural effusion    Patient presents to the emergency department with shortness of breath. Upon arrival he had tachycardia and was febrile. Chest x-ray at bedside showed right-sided opacification concerning for pneumonia. Given recent hospitalization he was covered with Vanc/Zosyn for health care associated pneumonia. He was also given Tylenol and IV fluids. It was felt that admission was necessary as patient had a pathological process and not usually reversible. Intubation was without complications and per procedure note above. Patient admitted to ICU for further treatment.    Corlis Leak, MD 12/30/13 [phone]

## 2013-12-29 NOTE — ED Notes (Signed)
Pt  Turned cleaned up temp rechecked.  Linen  Changed.  His legs continue to weep less swollen at present

## 2013-12-29 NOTE — ED Notes (Signed)
intubayted by ed res 24 at lip  7-5 tube

## 2013-12-29 NOTE — ED Notes (Signed)
Cc doctor at the bedside

## 2013-12-29 NOTE — ED Notes (Signed)
The pt arrived by gems from homw with sob.  Open sores on both lower extremities.  He arrived on c-pap.   Iv per ems

## 2013-12-29 NOTE — ED Notes (Signed)
Vancomycin infusing in the rt forearm iv not the lt

## 2013-12-29 NOTE — ED Notes (Signed)
Weeping open areas on both lower extremities both legs hard .  It appears to have gone on a long time

## 2013-12-29 NOTE — ED Notes (Signed)
etomadate 30mg  iv per [employer] roc 100mg  iv per [facility]

## 2013-12-29 NOTE — ED Notes (Signed)
Cc pa here to see

## 2013-12-29 NOTE — ED Notes (Signed)
Port chest 

## 2013-12-29 NOTE — ED Notes (Signed)
4th attempt to place a foley cath  unsuccessful

## 2013-12-29 NOTE — ED Notes (Signed)
Attempts x 3 by 2  Staff to insert foley cath unsuccessful ed res notified

## 2013-12-29 NOTE — ED Provider Notes (Signed)
I saw and evaluated the patient, reviewed the resident's note and I agree with the findings and plan.   EKG Interpretation   Date/Time:  Sunday Dec 29 2013 15:48:24 EDT Ventricular Rate:  113 PR Interval:  140 QRS Duration: 79 QT Interval:  352 QTC Calculation: 483 R Axis:   -4 Text Interpretation:  Sinus tachycardia Borderline repolarization  abnormality Borderline prolonged QT interval compared to prior,  nonspecific ST/T changes now present Confirmed by [facility]  MD, TREY ([id number])  on 12/29/2013 4:17:05 PM      CRITICAL CARE Performed by: Houston Siren III   Total critical care time: 40  Critical care time was exclusive of separately billable procedures and treating other patients.  Critical care was necessary to treat or prevent imminent or life-threatening deterioration.  Critical care was time spent personally by me on the following activities: development of treatment plan with patient and/or surrogate as well as nursing, discussions with consultants, evaluation of patient's response to treatment, examination of patient, obtaining history from patient or surrogate, ordering and performing treatments and interventions, ordering and review of laboratory studies, ordering and review of radiographic studies, pulse oximetry and re-evaluation of patient's condition.   80 year old male presenting in respiratory distress. Found to be febrile and tachycardic. Code Sepsis called. On exam, in respiratory distress, slightly confused, moving air bilaterally without wheezing, heart sounds normal with tachycardic rate but regular rhythm, abdomen soft, skin warm, blisters on feet which are weeping.  He required intubation shortly after arrival. I was present for and supervised the intubation performed by Dr. Fredric Dine.   Covered empirically for healthcare associated pneumonia. Plan admit to ICU.  Clinical Impression: 1. Acute respiratory failure   2. Acute on chronic diastolic CHF (congestive  heart failure)   3. Pleural effusion   4.  HCAP    Houston Siren III, MD 12/30/13 [phone]

## 2013-12-29 NOTE — ED Notes (Signed)
Preparing for intubation.  resp here  Dr Doy Mince at the bedside ed res intubating

## 2013-12-29 NOTE — Progress Notes (Signed)
Upon initial assessment found patient wet on bed from urine. Condom catheter placed, will bladder scan. Will continue to monitor.  Desmond Dike RN

## 2013-12-29 NOTE — ED Notes (Signed)
Report given to rn on 2100

## 2013-12-29 NOTE — ED Notes (Signed)
preparinf for

## 2013-12-30 ENCOUNTER — Inpatient Hospital Stay (HOSPITAL_COMMUNITY): Payer: Medicare Other

## 2013-12-30 DIAGNOSIS — J8410 Pulmonary fibrosis, unspecified: Secondary | ICD-10-CM | POA: Diagnosis present

## 2013-12-30 DIAGNOSIS — J449 Chronic obstructive pulmonary disease, unspecified: Secondary | ICD-10-CM | POA: Diagnosis present

## 2013-12-30 DIAGNOSIS — I059 Rheumatic mitral valve disease, unspecified: Secondary | ICD-10-CM

## 2013-12-30 DIAGNOSIS — J811 Chronic pulmonary edema: Secondary | ICD-10-CM | POA: Diagnosis present

## 2013-12-30 DIAGNOSIS — I509 Heart failure, unspecified: Secondary | ICD-10-CM

## 2013-12-30 LAB — COMPREHENSIVE METABOLIC PANEL
ALK PHOS: 36 U/L — AB (ref 39–117)
ALT: 37 U/L (ref 0–53)
AST: 62 U/L — ABNORMAL HIGH (ref 0–37)
Albumin: 2.7 g/dL — ABNORMAL LOW (ref 3.5–5.2)
BILIRUBIN TOTAL: 1.8 mg/dL — AB (ref 0.3–1.2)
BUN: 22 mg/dL (ref 6–23)
CO2: 21 mEq/L (ref 19–32)
Calcium: 8 mg/dL — ABNORMAL LOW (ref 8.4–10.5)
Chloride: 102 mEq/L (ref 96–112)
Creatinine, Ser: 1.53 mg/dL — ABNORMAL HIGH (ref 0.50–1.35)
GFR calc non Af Amer: 41 mL/min — ABNORMAL LOW (ref >90)
GFR, EST AFRICAN AMERICAN: 47 mL/min — AB (ref >90)
GLUCOSE: 118 mg/dL — AB (ref 70–99)
POTASSIUM: 3.1 meq/L — AB (ref 3.7–5.3)
Sodium: 139 mEq/L (ref 137–147)
Total Protein: 6.1 g/dL (ref 6.0–8.3)

## 2013-12-30 LAB — BLOOD GAS, ARTERIAL
Acid-base deficit: 2.1 mmol/L — ABNORMAL HIGH (ref 0.0–2.0)
Bicarbonate: 23.5 mEq/L (ref 20.0–24.0)
DRAWN BY: 39866
FIO2: 40 %
LHR: 16 {breaths}/min
MECHVT: 550 mL
O2 Saturation: 90.7 %
PCO2 ART: 48.9 mmHg — AB (ref 35.0–45.0)
PEEP/CPAP: 5 cmH2O
PH ART: 7.301 — AB (ref 7.350–7.450)
Patient temperature: 97.6
TCO2: 25.1 mmol/L (ref 0–100)
pO2, Arterial: 64.6 mmHg — ABNORMAL LOW (ref 80.0–100.0)

## 2013-12-30 LAB — GLUCOSE, CAPILLARY
GLUCOSE-CAPILLARY: 110 mg/dL — AB (ref 70–99)
Glucose-Capillary: 105 mg/dL — ABNORMAL HIGH (ref 70–99)
Glucose-Capillary: 109 mg/dL — ABNORMAL HIGH (ref 70–99)
Glucose-Capillary: 85 mg/dL (ref 70–99)
Glucose-Capillary: 86 mg/dL (ref 70–99)
Glucose-Capillary: 89 mg/dL (ref 70–99)

## 2013-12-30 LAB — LACTATE DEHYDROGENASE: LDH: 376 U/L — ABNORMAL HIGH (ref 94–250)

## 2013-12-30 LAB — PHOSPHORUS: Phosphorus: 3.5 mg/dL (ref 2.3–4.6)

## 2013-12-30 LAB — BODY FLUID CELL COUNT WITH DIFFERENTIAL
EOS FL: 0 %
Lymphs, Fluid: 62 %
Monocyte-Macrophage-Serous Fluid: 26 % — ABNORMAL LOW (ref 50–90)
Neutrophil Count, Fluid: 12 % (ref 0–25)
Total Nucleated Cell Count, Fluid: 118 cu mm (ref 0–1000)

## 2013-12-30 LAB — CBC
HCT: 37.9 % — ABNORMAL LOW (ref 39.0–52.0)
HEMOGLOBIN: 12.7 g/dL — AB (ref 13.0–17.0)
MCH: 32.8 pg (ref 26.0–34.0)
MCHC: 33.5 g/dL (ref 30.0–36.0)
MCV: 97.9 fL (ref 78.0–100.0)
Platelets: 184 10*3/uL (ref 150–400)
RBC: 3.87 MIL/uL — ABNORMAL LOW (ref 4.22–5.81)
RDW: 15.4 % (ref 11.5–15.5)
WBC: 6.9 10*3/uL (ref 4.0–10.5)

## 2013-12-30 LAB — PROTEIN, BODY FLUID: Total protein, fluid: 1.9 g/dL

## 2013-12-30 LAB — PROTEIN, TOTAL: Total Protein: 5.8 g/dL — ABNORMAL LOW (ref 6.0–8.3)

## 2013-12-30 LAB — LACTATE DEHYDROGENASE, PLEURAL OR PERITONEAL FLUID: LD FL: 90 U/L — AB (ref 3–23)

## 2013-12-30 LAB — PROCALCITONIN: Procalcitonin: 1.16 ng/mL

## 2013-12-30 LAB — PRO B NATRIURETIC PEPTIDE: Pro B Natriuretic peptide (BNP): 13811 pg/mL — ABNORMAL HIGH (ref 0–450)

## 2013-12-30 LAB — TROPONIN I
Troponin I: <0.3 ng/mL (ref <0.30)
Troponin I: <0.3 ng/mL (ref <0.30)

## 2013-12-30 LAB — CORTISOL: Cortisol, Plasma: 49.9 ug/dL

## 2013-12-30 LAB — MAGNESIUM: Magnesium: 1.9 mg/dL (ref 1.5–2.5)

## 2013-12-30 LAB — LACTIC ACID, PLASMA: LACTIC ACID, VENOUS: 3.7 mmol/L — AB (ref 0.5–2.2)

## 2013-12-30 LAB — STREP PNEUMONIAE URINARY ANTIGEN: Strep Pneumo Urinary Antigen: NEGATIVE

## 2013-12-30 MED ORDER — VITAL HIGH PROTEIN PO LIQD
1000.0000 mL | ORAL | Status: DC
  Administered 2013-12-30 – 2013-12-31 (×2): 1000 mL
  Filled 2013-12-30 (×3): qty 1000

## 2013-12-30 MED ORDER — COLLAGENASE 250 UNIT/GM EX OINT
TOPICAL_OINTMENT | Freq: Every day | CUTANEOUS | Status: DC
  Administered 2013-12-30 – 2014-01-01 (×3): 1 via TOPICAL
  Administered 2014-01-02: 2 via TOPICAL
  Administered 2014-01-03 – 2014-01-05 (×3): via TOPICAL
  Administered 2014-01-06: 1 via TOPICAL
  Administered 2014-01-07 – 2014-01-08 (×2): via TOPICAL
  Filled 2013-12-30 (×2): qty 30

## 2013-12-30 MED ORDER — PRO-STAT SUGAR FREE PO LIQD
30.0000 mL | Freq: Three times a day (TID) | ORAL | Status: DC
  Administered 2013-12-30 – 2013-12-31 (×3): 30 mL
  Filled 2013-12-30 (×5): qty 30

## 2013-12-30 MED ORDER — POTASSIUM CHLORIDE 20 MEQ/15ML (10%) PO LIQD
40.0000 meq | Freq: Three times a day (TID) | ORAL | Status: AC
Start: 2013-12-30 — End: 2013-12-30
  Administered 2013-12-30 (×3): 40 meq
  Filled 2013-12-30 (×3): qty 30

## 2013-12-30 MED ORDER — HYDRALAZINE HCL 20 MG/ML IJ SOLN: 10.0000 mg | INTRAMUSCULAR | Status: DC | PRN

## 2013-12-30 NOTE — Progress Notes (Signed)
INITIAL NUTRITION ASSESSMENT  DOCUMENTATION CODES Per approved criteria  -Obesity Unspecified   INTERVENTION:  Utilize 52M PEPuP Protocol: initiate TF via OGT with Vital High Protein at 25 ml/h and Prostat 30 ml TID on day 1; on day 2, increase to goal rate of 50 ml/h (1200 ml per day) to provide 1500 kcals, 150 gm protein, 1003 ml free water daily.  Above TF goal regimen plus Propofol will provide a total of 1788 kcals/day (25 kcals/kg ideal weight).  NUTRITION DIAGNOSIS: Inadequate oral intake related to inability to eat as evidenced by NPO status.   Goal: Enteral nutrition to provide 60-70% of estimated calorie needs (22-25 kcals/kg ideal body weight) and 100% of estimated protein needs, based on ASPEN guidelines for permissive underfeeding in critically ill obese individuals  Monitor:  TF tolerance/adequacy, weight trend, labs, vent status.  Reason for Assessment: MD Consult for TF initiation and management.  80 y.o. male  Admitting Dx: Acute respiratory failure; R. HCAP; decompensated CHF   ASSESSMENT: Patient is a 80 y.o. male history of CHF, chronic kidney disease, diabetes mellitus, hypertension, diastolic CHF, was brought to the ER on 5/17 because of increasing lower extremity edema and pain. X-ray showed moderate right-sided pleural effusion with possible atelectasis versus infiltrates. Patient required intubation on admission.  Patient is currently intubated on ventilator support MV: 10.8 L/min Temp (24hrs), Avg:99 F (37.2 C), Min:97 F (36.1 C), Max:103.9 F (39.9 C)  Propofol: 10.9 ml/hr providing 288 kcals/day.  Weight above usual weight due to fluid accumulation with increased swelling of his legs. NP in with patient for a procedure at this time, unable to complete nutrition focused physical exam at this time.   Height: Ht Readings from Last 1 Encounters:  12/29/13 5\' 9"  (1.753 m)    Weight: Wt Readings from Last 1 Encounters:  12/30/13 234 lb 9.1 oz  (106.4 kg)    Ideal Body Weight: 72.7 kg  % Ideal Body Weight: 146%  Wt Readings from Last 10 Encounters:  12/30/13 234 lb 9.1 oz (106.4 kg)  11/09/13 221 lb 3.2 oz (100.336 kg)  11/01/13 258 lb 3.2 oz (117.119 kg)  09/03/13 237 lb (107.502 kg)  08/30/13 233 lb (105.688 kg)  08/12/13 226 lb (102.513 kg)  08/06/13 220 lb (99.791 kg)  07/25/13 229 lb 9.6 oz (104.146 kg)  07/07/13 228 lb 6.4 oz (103.602 kg)  07/02/13 210 lb (95.255 kg)    Usual Body Weight: 210 lb  % Usual Body Weight: 111%  BMI:  Body mass index is 34.62 kg/(m^2). BMI=31 using usual weight of 210 lb.  Estimated Nutritional Needs: Kcal: 2250 Protein: >/= 145 gm Fluid: 2.2 L  Skin: blisters to left and right legs  Diet Order: NPO  EDUCATION NEEDS: -Education not appropriate at this time   Intake/Output Summary (Last 24 hours) at 12/30/13 1222 Last data filed at 12/30/13 1000  Gross per 24 hour  Intake 1657.74 ml  Output      0 ml  Net 1657.74 ml    Last BM: None documented since admission   Labs:   Recent Labs Lab 12/29/13 1544 12/29/13 1806 12/30/13 0523  NA 135*  --  139  K 3.9  --  3.1*  CL 98  --  102  CO2 20  --  21  BUN 22  --  22  CREATININE 1.55* 1.60* 1.53*  CALCIUM 8.6  --  8.0*  GLUCOSE 83  --  118*    CBG (last 3)   Recent  Labs  12/30/13 0351 12/30/13 0825 12/30/13 1134  GLUCAP 109* 110* 86    Scheduled Meds: . chlorhexidine  15 mL Mouth/Throat BID  . furosemide  40 mg Intravenous 3 times per day  . heparin  5,000 Units Subcutaneous 3 times per day  . insulin aspart  2-6 Units Subcutaneous 6 times per day  . ipratropium-albuterol  3 mL Nebulization Q4H  . pantoprazole (PROTONIX) IV  40 mg Intravenous QHS  . piperacillin-tazobactam (ZOSYN)  IV  3.375 g Intravenous 3 times per day  . potassium chloride  40 mEq Per Tube TID  . sodium chloride  3 mL Intravenous Q12H  . vancomycin  750 mg Intravenous Q12H    Continuous Infusions: . propofol 20 mcg/kg/min  (12/30/13 1000)    Past Medical History  Diagnosis Date  . Hypertension   . Shortness of breath     with exertion  . Diabetes mellitus     does not check his blood sugar at home  . Arthritis   . CHF (congestive heart failure)   . COPD (chronic obstructive pulmonary disease)   . Hyperlipidemia   . Chronic kidney disease     renal insuficiency  . Coronary artery disease     Past Surgical History  Procedure Laterality Date  . Leg surgery      both legs - unsure of exact operations - from trauma  . Tonsillectomy    . Appendectomy    . Cataract extraction w/phaco Left 09/04/2013    Procedure: CATARACT EXTRACTION PHACO AND INTRAOCULAR LENS PLACEMENT (IOC);  Surgeon: Adonis Brook, MD;  Location: [place];  Service: Ophthalmology;  Laterality: Left;    Molli Barrows, RD, LDN, [place] Pager [phone] After Hours Pager [phone]

## 2013-12-30 NOTE — Procedures (Signed)
Thoracentesis Procedure Note  Pre-operative Diagnosis: R Pleural Effusion   Post-operative Diagnosis: same  Indications: Therapeutic & Diagnostic Evaluation   Procedure Details  Consent: Informed consent was obtained. Risks of the procedure were discussed including: infection, bleeding, pain, pneumothorax.  Under sterile conditions the patient was positioned. Betadine solution and sterile drapes were utilized.  1% buffered lidocaine was used to anesthetize the 6th lateral rib space. Fluid was obtained without any difficulties and minimal blood loss.  A dressing was applied to the wound and wound care instructions were provided.   Findings 1700 ml of clear amber pleural fluid was obtained. A sample was sent to Pathology for cytogenetics, flow, and cell counts, as well as for infection analysis.  Complications:  None; patient tolerated the procedure well.          Condition: stable  Plan A follow up chest x-ray was ordered. Tylenol 650 mg. for pain.  Attending Attestation: I was present and scrubbed for the entire procedure.   Procedure performed under direct supervision of Dr. Alva Garnet and with ultrasound guidance for real time vessel cannulation.      Noe Gens, NP-C [place] [facility] Pgr: [phone] or [phone]  I was present for and supervised the entire procedure  Merton Border, MD ; [facility] service Mobile [phone].  After 5:30 PM or weekends, call [phone]

## 2013-12-30 NOTE — Progress Notes (Signed)
Echocardiogram 2D Echocardiogram has been performed.  Ines Bloomer 12/30/2013, 2:38 PM

## 2013-12-30 NOTE — Progress Notes (Signed)
PULMONARY / CRITICAL CARE MEDICINE   Name: [name] [name] MRN: [id number] DOB: Aug 22, 1930   PCP [name], MD Cardiology: Dr Adrian Prows Pulmonary: Dr Ann Lions  ADMISSION DATE:  12/29/2013   REFERRING MD :  EDP PRIMARY SERVICE: PCCM   CHIEF COMPLAINT:  Acute Resp Failure   BRIEF PATIENT DESCRIPTION:  89 M with history of CHF, chronic kidney disease, diabetes mellitus, hypertension admitted 5/17 to PCCM service via ED with acute on chronic resp failure due to pulm edema and/or PNA superimposed on chronic lung disease  SIGNIFICANT EVENTS / STUDIES:  5/17 CT chest: Loculation of the large right and small left pleural effusions favors exudative effusion. This does not necessarily imply empyema. Mild mediastinal adenopathy. Emphysema. Patchy bilateral airspace opacities in the upper and lower lobes  could be from bilateral multi lobar pneumonia or edema. The presence of secondary pulmonary lobular septal thickening in the lung apices and cardiomegaly tends favor cardiogenic pulmonary edema. 5/18 Echocardiogram:  5/18 R thoracentesis: 1700 cc light amber fluid removed. LDH 90. Protein 1.9. 118 WBC, 62% lymphs  LINES / TUBES: 5/17 ETT >>   CULTURES: Urine 5/17 >>  Resp 5/17 >>  Blood 5/17 >>   ANTIBIOTICS: Vanc 5/17 >> Zosyn 5/17 >>  SUBJECTIVE:  RASS -2. Not F/C  VITAL SIGNS: Temp:  [97 F (36.1 C)-100.3 F (37.9 C)] 100.3 F (37.9 C) (05/18 1626) Pulse Rate:  [83-119] 90 (05/18 1600) Resp:  [0-26] 18 (05/18 1600) BP: (71-170)/(44-116) 111/47 mmHg (05/18 1600) SpO2:  [93 %-100 %] 98 % (05/18 1600) FiO2 (%):  [40 %-75 %] 40 % (05/18 1600) Weight:  [105 kg (231 lb 7.7 oz)-106.4 kg (234 lb 9.1 oz)] 106.4 kg (234 lb 9.1 oz) (05/18 0408) HEMODYNAMICS:   VENTILATOR SETTINGS: Vent Mode:  [-] PRVC FiO2 (%):  [40 %-75 %] 40 % Set Rate:  [16 bmp] 16 bmp Vt Set:  [550 mL] 550 mL PEEP:  [5 cmH20] 5 cmH20 Plateau Pressure:  [20 cmH20-27 cmH20] 22 cmH20 INTAKE /  OUTPUT: Intake/Output     05/17 0701 - 05/18 0700 05/18 0701 - 05/19 0700   I.V. (mL/kg) 1366.8 (12.8) 157.9 (1.5)   NG/GT  50   IV Piggyback 250 37.5   Total Intake(mL/kg) 1616.8 (15.2) 245.4 (2.3)   Urine (mL/kg/hr)  800 (0.8)   Total Output   800   Net +1616.8 -554.6        Urine Occurrence 1 x      PHYSICAL EXAMINATION: General:  Sedated on vent  Neuro:  MAEs HEENT: WNL Cardiovascular:  RRR, no M noted Lungs:  scatterd rhonchi, no wheezes Abdomen: soft , BS hypoactive Ext:  LE w/ old surgical scars.    LABS: I have reviewed all of today's lab results. Relevant abnormalities are discussed in the A/P section   CXR: Edema pattern with large R and small L effusion  ASSESSMENT / PLAN:  PULMONARY A:  COPD, emphysema ILD, NOS Chronic PAH Chronic hypoxic resp failure Acute resp failure - suspect edema superimposed on chronic lung disease R>L pleural effusions P:   Cont full vent support - settings reviewed and/or adjusted Cont vent bundle Daily SBT if/when meets criteria Thoracentesis performed 5/18   CARDIOVASCULAR A: Acute on chronic diastolic CHF HTN  P:  Holding anti-hypertensives Monitor   RENAL A:  CRI P:   Monitor BMET intermittently Monitor I/Os Correct electrolytes as indicated  GASTROINTESTINAL A:   Mild LFT elevation - suspect congestion P:   SUP: IV PPI  Begin TFs Monitor LFTs intermittently  HEMATOLOGIC A:   Mild anemia without acute blood loss P:  DVT px: SQ heparin Monitor CBC intermittently Transfuse per usual ICU guidelines  INFECTIOUS A:   Possible HCAP P:   Micro and abx as above  ENDOCRINE A:  DM 2 P:   Cont SSI   NEUROLOGIC A:  Acute encephalopathy  P:   Sedation protocol Daily WUA     Son and 2 care providers updated @ bedside. [name] is very upset and having difficulty coping with his Dad's EOL issues.   45 mins CCM time   Merton Border, MD ; [facility] [phone].  After 5:30 PM or  weekends, call [phone]    12/30/2013, 4:27 PM

## 2013-12-30 NOTE — ED Provider Notes (Signed)
I saw and evaluated the patient, reviewed the resident's note and I agree with the findings and plan.   EKG Interpretation   Date/Time:  Sunday Dec 29 2013 15:48:24 EDT Ventricular Rate:  113 PR Interval:  140 QRS Duration: 79 QT Interval:  352 QTC Calculation: 483 R Axis:   -4 Text Interpretation:  Sinus tachycardia Borderline repolarization  abnormality Borderline prolonged QT interval compared to prior,  nonspecific ST/T changes now present Confirmed by [facility]  MD, TREY ([id number])  on 12/29/2013 4:17:05 PM        Jenny Reichmann Elba Barman III, MD 12/30/13 1247

## 2013-12-30 NOTE — Consult Note (Addendum)
WOC wound consult note Reason for Consult: Consult requested for leg wounds.  Family  Told the bedside nurse that the patient is followed as an outpatient by the wound care center.   Pt has bulla to bilat feet and chronic wounds to right leg. Wound type: Left anterior foot with bulla 10X10 cm which ruptures easily when touched, draining large amt clear yellow drainage.  No odor.  Skin approximated over 95% of wound.  Bright red wound bed underneath.  Pink dry scar intact to calf and left ankle areas. Right foot with 2 areas of bulla which have ruptured. Large amt yellow drainage leaking from sites, no odor.  Red moist skin revealed beneath.  Skin approximated over 90% of each wound.  Right inner ankle 6X5cm and right anterior foot 10X10cm.   Measurement: Right calf with 2 full thickness wounds; 2.5X2X.3cm and .8X.8X.2cm.  Both sites 100% yellow slough, mod amt yellow drainage, no odor.   Dressing procedure/placement/frequency: Foam dressing to protect skin, absorb drainage, and promote healing to left foot with ABD pad to absorb drainage. Xeroform to promote healing to right foot.  Santyl ointment for chemical debridement of nonviable tissue to right leg wounds.  Pt can resume follow-up with outpatient wound care center after discharge. Please re-consult if further assistance is needed.  Thank-you,  Julien Girt MSN, [place], [place], [place], [place]

## 2013-12-30 NOTE — Care Management Note (Addendum)
    Page 1 of 1   01/08/2014     4:53:42 PM CARE MANAGEMENT NOTE 01/08/2014  Patient:  [name] [name], [name] [name]   Account Number:  [id number]  Date Initiated:  12/30/2013  Documentation initiated by:  Elissa Hefty  Subjective/Objective Assessment:   adm w resp failure, vent     Action/Plan:   lives alone, pcp dr Maudie Mercury [place]   Anticipated DC Date:  01/08/2014   Anticipated DC Plan:  [place]  In-house referral  Clinical Social Worker      DC Planning Services  CM consult      Choice offered to / List presented to:             Status of service:  Completed, signed off Medicare Important Message given?  YES (If response is "NO", the following Medicare IM given date fields will be blank) Date Medicare IM given:   Date Additional Medicare IM given:  01/08/2014  Discharge Disposition:  [place]  Per UR Regulation:  Reviewed for med. necessity/level of care/duration of stay  If discussed at [place] of Stay Meetings, dates discussed:    Comments:  Contact:  Hill,Arnita Other     [place]     [phone]  01/08/14 Ellan Lambert, RN, BSN [phone] Pt discharging to SNF today, per CSW arrangements.  12-31-13 2:30pm [place] [phone] Remains on vent - weaning today after R pleural effusion drained on 5-18. Hope to extubate. Extubated, failed bipap - reintubated.

## 2013-12-31 ENCOUNTER — Inpatient Hospital Stay (HOSPITAL_COMMUNITY): Payer: Medicare Other

## 2013-12-31 DIAGNOSIS — N179 Acute kidney failure, unspecified: Secondary | ICD-10-CM

## 2013-12-31 DIAGNOSIS — J123 Human metapneumovirus pneumonia: Secondary | ICD-10-CM

## 2013-12-31 DIAGNOSIS — J1289 Other viral pneumonia: Principal | ICD-10-CM

## 2013-12-31 LAB — COMPREHENSIVE METABOLIC PANEL
ALK PHOS: 33 U/L — AB (ref 39–117)
ALT: 33 U/L (ref 0–53)
AST: 64 U/L — ABNORMAL HIGH (ref 0–37)
Albumin: 2.5 g/dL — ABNORMAL LOW (ref 3.5–5.2)
BUN: 24 mg/dL — AB (ref 6–23)
CO2: 27 mEq/L (ref 19–32)
CREATININE: 1.73 mg/dL — AB (ref 0.50–1.35)
Calcium: 8.1 mg/dL — ABNORMAL LOW (ref 8.4–10.5)
Chloride: 106 mEq/L (ref 96–112)
GFR calc non Af Amer: 35 mL/min — ABNORMAL LOW (ref >90)
GFR, EST AFRICAN AMERICAN: 41 mL/min — AB (ref >90)
Glucose, Bld: 95 mg/dL (ref 70–99)
POTASSIUM: 3.7 meq/L (ref 3.7–5.3)
Sodium: 144 mEq/L (ref 137–147)
TOTAL PROTEIN: 5.7 g/dL — AB (ref 6.0–8.3)
Total Bilirubin: 1.9 mg/dL — ABNORMAL HIGH (ref 0.3–1.2)

## 2013-12-31 LAB — BLOOD GAS, ARTERIAL
ACID-BASE EXCESS: 3.8 mmol/L — AB (ref 0.0–2.0)
BICARBONATE: 27.4 meq/L — AB (ref 20.0–24.0)
DRAWN BY: 225631
FIO2: 0.8 %
MECHVT: 550 mL
O2 Saturation: 99.8 %
PEEP/CPAP: 5 cmH2O
PO2 ART: 169 mmHg — AB (ref 80.0–100.0)
Patient temperature: 98.6
RATE: 16 resp/min
TCO2: 28.6 mmol/L (ref 0–100)
pCO2 arterial: 38.7 mmHg (ref 35.0–45.0)
pH, Arterial: 7.465 — ABNORMAL HIGH (ref 7.350–7.450)

## 2013-12-31 LAB — RESPIRATORY VIRUS PANEL
Adenovirus: NOT DETECTED
INFLUENZA A H3: NOT DETECTED
Influenza A H1: NOT DETECTED
Influenza A: NOT DETECTED
Influenza B: NOT DETECTED
Metapneumovirus: DETECTED — AB
PARAINFLUENZA 1 A: NOT DETECTED
Parainfluenza 2: NOT DETECTED
Parainfluenza 3: NOT DETECTED
RESPIRATORY SYNCYTIAL VIRUS A: NOT DETECTED
RESPIRATORY SYNCYTIAL VIRUS B: NOT DETECTED
Rhinovirus: DETECTED — AB

## 2013-12-31 LAB — PROCALCITONIN: PROCALCITONIN: 1.34 ng/mL

## 2013-12-31 LAB — PRO B NATRIURETIC PEPTIDE: Pro B Natriuretic peptide (BNP): 7302 pg/mL — ABNORMAL HIGH (ref 0–450)

## 2013-12-31 LAB — CBC
HEMATOCRIT: 36.9 % — AB (ref 39.0–52.0)
HEMOGLOBIN: 12.3 g/dL — AB (ref 13.0–17.0)
MCH: 32.6 pg (ref 26.0–34.0)
MCHC: 33.3 g/dL (ref 30.0–36.0)
MCV: 97.9 fL (ref 78.0–100.0)
Platelets: 209 10*3/uL (ref 150–400)
RBC: 3.77 MIL/uL — ABNORMAL LOW (ref 4.22–5.81)
RDW: 15.6 % — ABNORMAL HIGH (ref 11.5–15.5)
WBC: 8.2 10*3/uL (ref 4.0–10.5)

## 2013-12-31 LAB — GLUCOSE, CAPILLARY
GLUCOSE-CAPILLARY: 92 mg/dL (ref 70–99)
Glucose-Capillary: 71 mg/dL (ref 70–99)
Glucose-Capillary: 80 mg/dL (ref 70–99)
Glucose-Capillary: 89 mg/dL (ref 70–99)
Glucose-Capillary: 93 mg/dL (ref 70–99)
Glucose-Capillary: 97 mg/dL (ref 70–99)

## 2013-12-31 LAB — MAGNESIUM: Magnesium: 1.9 mg/dL (ref 1.5–2.5)

## 2013-12-31 LAB — PHOSPHORUS: Phosphorus: 2.5 mg/dL (ref 2.3–4.6)

## 2013-12-31 MED ORDER — POTASSIUM CHLORIDE 20 MEQ/15ML (10%) PO LIQD
20.0000 meq | ORAL | Status: AC
  Administered 2013-12-31 (×2): 20 meq
  Filled 2013-12-31 (×2): qty 15

## 2013-12-31 MED ORDER — FENTANYL CITRATE 0.05 MG/ML IJ SOLN
12.5000 ug | INTRAMUSCULAR | Status: DC | PRN
  Administered 2013-12-31 – 2014-01-02 (×5): 25 ug via INTRAVENOUS
  Filled 2013-12-31 (×5): qty 2

## 2013-12-31 MED ORDER — MIDAZOLAM HCL 2 MG/2ML IJ SOLN
2.0000 mg | Freq: Once | INTRAMUSCULAR | Status: AC
  Administered 2013-12-31: 2 mg via INTRAVENOUS

## 2013-12-31 MED ORDER — POTASSIUM CHLORIDE 20 MEQ/15ML (10%) PO LIQD
20.0000 meq | Freq: Once | ORAL | Status: AC
  Administered 2013-12-31: 20 meq
  Filled 2013-12-31: qty 15

## 2013-12-31 MED ORDER — FUROSEMIDE 10 MG/ML IJ SOLN
40.0000 mg | Freq: Once | INTRAMUSCULAR | Status: AC
  Administered 2013-12-31: 40 mg via INTRAVENOUS

## 2013-12-31 MED ORDER — BUDESONIDE 0.25 MG/2ML IN SUSP
0.2500 mg | Freq: Four times a day (QID) | RESPIRATORY_TRACT | Status: DC
  Administered 2013-12-31 (×3): 0.25 mg via RESPIRATORY_TRACT
  Filled 2013-12-31 (×4): qty 2

## 2013-12-31 MED ORDER — FUROSEMIDE 10 MG/ML IJ SOLN
INTRAMUSCULAR | Status: AC
  Filled 2013-12-31: qty 4

## 2013-12-31 MED ORDER — PROPOFOL 10 MG/ML IV EMUL
5.0000 ug/kg/min | INTRAVENOUS | Status: DC
  Administered 2013-12-31: 15 ug/kg/min via INTRAVENOUS
  Administered 2014-01-01 – 2014-01-02 (×5): 20 ug/kg/min via INTRAVENOUS
  Filled 2013-12-31 (×7): qty 100

## 2013-12-31 MED ORDER — MIDAZOLAM HCL 2 MG/2ML IJ SOLN
INTRAMUSCULAR | Status: AC
  Filled 2013-12-31: qty 2

## 2013-12-31 MED ORDER — FENTANYL CITRATE 0.05 MG/ML IJ SOLN
100.0000 ug | Freq: Once | INTRAMUSCULAR | Status: AC
Start: 2013-12-31 — End: 2013-12-31
  Administered 2013-12-31: 100 ug via INTRAVENOUS

## 2013-12-31 MED ORDER — SODIUM PHOSPHATE 3 MMOLE/ML IV SOLN
10.0000 mmol | Freq: Once | INTRAVENOUS | Status: DC
  Administered 2013-12-31: 10 mmol via INTRAVENOUS
  Filled 2013-12-31: qty 3.33

## 2013-12-31 MED ORDER — IPRATROPIUM-ALBUTEROL 0.5-2.5 (3) MG/3ML IN SOLN
3.0000 mL | Freq: Four times a day (QID) | RESPIRATORY_TRACT | Status: DC
  Administered 2013-12-31 – 2014-01-07 (×29): 3 mL via RESPIRATORY_TRACT
  Filled 2013-12-31 (×28): qty 3

## 2013-12-31 MED ORDER — DEXTROSE 5 % IV SOLN
1.0000 g | INTRAVENOUS | Status: AC
  Administered 2013-12-31 – 2014-01-04 (×5): 1 g via INTRAVENOUS
  Filled 2013-12-31 (×5): qty 10

## 2013-12-31 MED ORDER — PROPOFOL 10 MG/ML IV EMUL
INTRAVENOUS | Status: AC
  Filled 2013-12-31: qty 100

## 2013-12-31 MED ORDER — FENTANYL CITRATE 0.05 MG/ML IJ SOLN
INTRAMUSCULAR | Status: AC
  Filled 2013-12-31: qty 2

## 2013-12-31 MED ORDER — ETOMIDATE 2 MG/ML IV SOLN
20.0000 mg | Freq: Once | INTRAVENOUS | Status: AC
  Administered 2013-12-31: 20 mg via INTRAVENOUS

## 2013-12-31 NOTE — Procedures (Signed)
Intubation Procedure Note Devone Tousley [id number] 11-12-1930  Procedure: Intubation Indications: Respiratory insufficiency  Procedure Details Consent: Unable to obtain consent because of altered level of consciousness. Time Out: Verified patient identification, verified procedure, site/side was marked, verified correct patient position, special equipment/implants available, medications/allergies/relevent history reviewed, required imaging and test results available.  Performed  Maximum sterile technique was used including antiseptics, cap, gloves, gown, hand hygiene and mask.  MAC and 3    Evaluation Hemodynamic Status: BP stable throughout; O2 sats: stable throughout Patient's Current Condition: stable Complications: No apparent complications Patient did tolerate procedure well. Chest X-ray ordered to verify placement.  CXR: pending.   Montey Hora, [place] [facility] Pgr: [phone]  or [phone]

## 2013-12-31 NOTE — Procedures (Signed)
Supervised, present through the entire procedure.  Vanessa Alesi, MD Pulmonary and Critical Care Medicine [facility] Pager: [phone]  

## 2013-12-31 NOTE — Progress Notes (Signed)
eLink Physician-Brief Progress Note Patient Name: [name] [name] DOB: June 10, 1931 MRN: [id number]  Date of Service  12/31/2013   HPI/Events of Note   Resp distress on bipap Extubated this morning Presumed ILD/COPD and pulm edema Per RN/RT, crackles on exam  eICU Interventions  Add additional dose of lasix to match baseline CKD If no improvement or diuresis, then will need reintubation   Intervention Category Major Interventions: Respiratory failure - evaluation and management  Juanito Doom 12/31/2013, 3:21 PM

## 2013-12-31 NOTE — Progress Notes (Signed)
PULMONARY / CRITICAL CARE MEDICINE   Name: [name] [name] MRN: [id number] DOB: 09/15/1930   PCP [name], MD Cardiology: Dr Adrian Prows Pulmonary: Dr Ann Lions  ADMISSION DATE:  12/29/2013   REFERRING MD :  EDP PRIMARY SERVICE: PCCM   CHIEF COMPLAINT:  Acute Resp Failure   BRIEF PATIENT DESCRIPTION:  77 M with history of CHF, chronic kidney disease, diabetes mellitus, hypertension admitted 5/17 to PCCM service via ED with acute on chronic resp failure due to pulm edema and/or PNA superimposed on chronic lung disease  SIGNIFICANT EVENTS / STUDIES:  5/17 CT chest: Loculation of the large right and small left pleural effusions favors exudative effusion. This does not necessarily imply empyema. Mild mediastinal adenopathy. Emphysema. Patchy bilateral airspace opacities in the upper and lower lobes  could be from bilateral multi lobar pneumonia or edema. The presence of secondary pulmonary lobular septal thickening in the lung apices and cardiomegaly tends favor cardiogenic pulmonary edema. 5/18 Echocardiogram:  5/18 R thoracentesis: 1700 cc light amber fluid removed. LDH 90. Protein 1.9. 118 WBC, 62% lymphs 5/19 Passed SBT. Failed NPPV after planned extubation. Re-intubated  LINES / TUBES: 5/17 ETT >> 5/19, 5/19  CULTURES: Resp 5/17 >> NOF RVP 5/17 >> POS metapneumovirus and rhinovirus Blood 5/17 >>  Pleural fluid 5/18 >>   ANTIBIOTICS: Vanc 5/17 >> 5/19 Zosyn 5/17 >> 5/19 Ceftriaxone 5/19 >>   SUBJECTIVE:  Passed SBT. Failed planned extubation. Re-intubated  VITAL SIGNS: Temp:  [99.3 F (37.4 C)-100.5 F (38.1 C)] 99.3 F (37.4 C) (05/19 2000) Pulse Rate:  [97-130] 103 (05/19 2000) Resp:  [14-40] 16 (05/19 2000) BP: (114-166)/(44-92) 141/57 mmHg (05/19 2000) SpO2:  [95 %-100 %] 99 % (05/19 2047) FiO2 (%):  [35 %-80 %] 40 % (05/19 2047) Weight:  [102.6 kg (226 lb 3.1 oz)] 102.6 kg (226 lb 3.1 oz) (05/19 0100) HEMODYNAMICS:   VENTILATOR SETTINGS: Vent Mode:   [-] PRVC FiO2 (%):  [35 %-80 %] 40 % Set Rate:  [16 bmp] 16 bmp Vt Set:  [550 mL] 550 mL PEEP:  [5 cmH20] 5 cmH20 Pressure Support:  [5 cmH20] 5 cmH20 Plateau Pressure:  [14 cmH20-18 cmH20] 14 cmH20 INTAKE / OUTPUT: Intake/Output     05/19 0701 - 05/20 0700   I.V. (mL/kg) 127.6 (1.2)   NG/GT 150   IV Piggyback 243   Total Intake(mL/kg) 520.6 (5.1)   Urine (mL/kg/hr) 3500 (2.4)   Other    Total Output 3500   Net -2979.4         PHYSICAL EXAMINATION: General:  Sedated on vent  Neuro:  MAEs HEENT: WNL Cardiovascular:  RRR, no M noted Lungs:  scatterd rhonchi, no wheezes Abdomen: soft , BS hypoactive Ext:  LE w/ old surgical scars.    LABS: I have reviewed all of today's lab results. Relevant abnormalities are discussed in the A/P section   CXR: [place] edema pattern. R effusion improved after thoracentesis  ASSESSMENT / PLAN:  PULMONARY A:  Chronic hypoxic resp failure COPD, emphysema ILD, NOS Chronic PAH Acute resp failure - suspect edema and/or viral pneumonitis B pleural effusions, s/p thoracentesis on R > transudate P:   Cont full vent support - settings reviewed and/or adjusted Cont vent bundle Daily SBT if/when meets criteria Diuresis to extent permitted by BP  CARDIOVASCULAR A: Acute on chronic diastolic CHF HTN  P:  Holding anti-hypertensives PRN hydralazine  RENAL A:  Acute on CRI P:   Monitor BMET intermittently Monitor I/Os Correct electrolytes as indicated  GASTROINTESTINAL A:   Mild LFT elevation - suspect congestion P:   SUP: IV PPI Begin TFs Monitor LFTs intermittently  HEMATOLOGIC A:   Mild anemia without acute blood loss P:  DVT px: SQ heparin Monitor CBC intermittently Transfuse per usual ICU guidelines  INFECTIOUS A:   Metapneumovirus pneumonitis Purulent bronchitis P:   Micro and abx as above  ENDOCRINE A:  DM 2 P:   Cont SSI   NEUROLOGIC A:  Acute encephalopathy  ICu associated discomfort P:   Sedation  protocol Daily WUA    40 mins CCM time   Merton Border, MD ; [facility] service Mobile [phone].  After 5:30 PM or weekends, call [phone]    12/31/2013, 9:16 PM

## 2013-12-31 NOTE — Procedures (Signed)
Extubation Procedure Note  Patient Details:   Name: Aleks Nawrot DOB: 04/14/1931 MRN: [id number]   Airway Documentation:     Evaluation  O2 sats: stable throughout Complications: No apparent complications Patient did tolerate procedure well. Bilateral Breath Sounds: Diminished Suctioning: Oral;Airway Yes Order placed by Dr. Alva Garnet for extubation, pt. weaning very well since beginning of shift, able to hold head off bed spontaneously, NIF-(-20)cmh20, FVC-(.85)L, (+) cuff leak, I/S started with only 500cc obtained with mild to moderate cough, remains alert, placed on 2 lpm n/c, no Stridor noted s/p procedure, RT to monitor. Dia Crawford 12/31/2013, 12:23 PM

## 2013-12-31 NOTE — Progress Notes (Signed)
eLink Physician-Brief Progress Note Patient Name: [name] [name] DOB: 01/08/1931 MRN: [id number]  Date of Service  12/31/2013   HPI/Events of Note   No change in respiratory status with lasix Increased work of breathing on BIPAP  eICU Interventions  Bedside team to evaluate for intubation   Intervention Category Major Interventions: Respiratory failure - evaluation and management  Juanito Doom 12/31/2013, 4:05 PM

## 2013-12-31 NOTE — Progress Notes (Signed)
[facility] ADULT ICU REPLACEMENT PROTOCOL FOR AM LAB REPLACEMENT ONLY  The patient does apply for the [facility] Adult ICU Electrolyte Replacment Protocol based on the criteria listed below:   1. Is GFR >/= 40 ml/min? yes  Patient's GFR today is 41 2. Is urine output >/= 0.5 ml/kg/hr for the last 6 hours? yes Patient's UOP is 1.3 ml/kg/hr 3. Is BUN < 60 mg/dL? yes  Patient's BUN today is 24 4. Abnormal electrolyte K 3.7 5. Ordered repletion with: per protocol 6. If a panic level lab has been reported, has the CCM MD in charge been notified? yes.   Physician:  Dr Lorita Officer McEachran Lashelle Koy 12/31/2013 4:44 AM

## 2013-12-31 NOTE — Progress Notes (Signed)
Pt. placed on niv/pc thru servo-I vent due to >'d WOB, vent in room from a.m. extubation, RN aware @ bedside, RT to monitor.

## 2014-01-01 ENCOUNTER — Inpatient Hospital Stay (HOSPITAL_COMMUNITY): Payer: Medicare Other

## 2014-01-01 LAB — CULTURE, RESPIRATORY W GRAM STAIN

## 2014-01-01 LAB — GLUCOSE, CAPILLARY
GLUCOSE-CAPILLARY: 63 mg/dL — AB (ref 70–99)
GLUCOSE-CAPILLARY: 75 mg/dL (ref 70–99)
GLUCOSE-CAPILLARY: 87 mg/dL (ref 70–99)
Glucose-Capillary: 68 mg/dL — ABNORMAL LOW (ref 70–99)
Glucose-Capillary: 93 mg/dL (ref 70–99)
Glucose-Capillary: 78 mg/dL (ref 70–99)
Glucose-Capillary: 80 mg/dL (ref 70–99)

## 2014-01-01 LAB — BASIC METABOLIC PANEL
BUN: 29 mg/dL — ABNORMAL HIGH (ref 6–23)
BUN: 29 mg/dL — ABNORMAL HIGH (ref 6–23)
CALCIUM: 8.3 mg/dL — AB (ref 8.4–10.5)
CALCIUM: 8.5 mg/dL (ref 8.4–10.5)
CHLORIDE: 107 meq/L (ref 96–112)
CO2: 28 meq/L (ref 19–32)
CO2: 27 mEq/L (ref 19–32)
CREATININE: 1.73 mg/dL — AB (ref 0.50–1.35)
Chloride: 106 mEq/L (ref 96–112)
Creatinine, Ser: 1.8 mg/dL — ABNORMAL HIGH (ref 0.50–1.35)
GFR calc Af Amer: 39 mL/min — ABNORMAL LOW (ref >90)
GFR calc Af Amer: 41 mL/min — ABNORMAL LOW (ref >90)
GFR calc non Af Amer: 34 mL/min — ABNORMAL LOW (ref >90)
GFR, EST NON AFRICAN AMERICAN: 35 mL/min — AB (ref >90)
Glucose, Bld: 83 mg/dL (ref 70–99)
Glucose, Bld: 73 mg/dL (ref 70–99)
Potassium: 3.6 mEq/L — ABNORMAL LOW (ref 3.7–5.3)
Potassium: 4.4 mEq/L (ref 3.7–5.3)
SODIUM: 146 meq/L (ref 137–147)
SODIUM: 147 meq/L (ref 137–147)

## 2014-01-01 LAB — CULTURE, RESPIRATORY

## 2014-01-01 LAB — PROCALCITONIN: PROCALCITONIN: 1.44 ng/mL

## 2014-01-01 MED ORDER — BUDESONIDE 0.25 MG/2ML IN SUSP
0.2500 mg | Freq: Four times a day (QID) | RESPIRATORY_TRACT | Status: DC
  Administered 2014-01-01 – 2014-01-04 (×12): 0.25 mg via RESPIRATORY_TRACT
  Filled 2014-01-01 (×17): qty 2

## 2014-01-01 MED ORDER — FUROSEMIDE 10 MG/ML IJ SOLN
40.0000 mg | Freq: Once | INTRAMUSCULAR | Status: AC
  Administered 2014-01-01: 40 mg via INTRAVENOUS
  Filled 2014-01-01: qty 4

## 2014-01-01 MED ORDER — DEXTROSE 50 % IV SOLN
INTRAVENOUS | Status: AC
  Filled 2014-01-01: qty 50

## 2014-01-01 MED ORDER — PRO-STAT SUGAR FREE PO LIQD
30.0000 mL | Freq: Three times a day (TID) | ORAL | Status: DC
  Administered 2014-01-01 – 2014-01-02 (×3): 30 mL
  Filled 2014-01-01 (×5): qty 30

## 2014-01-01 MED ORDER — DEXTROSE 50 % IV SOLN
25.0000 mL | Freq: Once | INTRAVENOUS | Status: AC | PRN
  Administered 2014-01-01: 25 mL via INTRAVENOUS

## 2014-01-01 MED ORDER — FREE WATER
200.0000 mL | Freq: Three times a day (TID) | Status: DC
  Administered 2014-01-01 – 2014-01-02 (×3): 200 mL

## 2014-01-01 MED ORDER — DEXTROSE-NACL 5-0.45 % IV SOLN: INTRAVENOUS | Status: DC

## 2014-01-01 MED ORDER — POTASSIUM CHLORIDE 20 MEQ/15ML (10%) PO LIQD
20.0000 meq | Freq: Once | ORAL | Status: AC
Start: 2014-01-01 — End: 2014-01-01
  Administered 2014-01-01: 20 meq
  Filled 2014-01-01: qty 15

## 2014-01-01 MED ORDER — VITAL HIGH PROTEIN PO LIQD
1000.0000 mL | ORAL | Status: DC
  Administered 2014-01-01: 1000 mL
  Filled 2014-01-01 (×3): qty 1000

## 2014-01-01 NOTE — Progress Notes (Signed)
UR Completed.  Vergie Living [phone] 01/01/2014

## 2014-01-01 NOTE — Progress Notes (Signed)
PULMONARY / CRITICAL CARE MEDICINE   Name: [name] [name] MRN: [id number] DOB: 1930-12-23   PCP [name], MD Cardiology: Dr Adrian Prows Pulmonary: Dr Ann Lions  ADMISSION DATE:  12/29/2013   REFERRING MD :  EDP PRIMARY SERVICE: PCCM   CHIEF COMPLAINT:  Acute Resp Failure   BRIEF PATIENT DESCRIPTION:  24 M with history of CHF, chronic kidney disease, diabetes mellitus, hypertension admitted 5/17 to PCCM service via ED with acute on chronic resp failure due to pulm edema and/or PNA superimposed on chronic lung disease  SIGNIFICANT EVENTS / STUDIES:  5/17 - CT chest: Loculation of the large right and small left pleural effusions favors exudative effusion. This does not necessarily imply empyema. Mild mediastinal adenopathy. Emphysema. Patchy bilateral airspace opacities in the upper and lower lobes  could be from bilateral multi lobar pneumonia or edema. The presence of secondary pulmonary lobular septal thickening in the lung apices and cardiomegaly tends favor cardiogenic pulmonary edema. 5/18 - Echocardiogram:  5/18 - R thoracentesis: 1700 cc light amber fluid removed. LDH 90. Protein 1.9. 118 WBC, 62% lymphs 5/19 - Passed SBT. Failed NPPV after planned extubation. Re-intubated  LINES / TUBES: 5/17 ETT >> 5/19, 5/19  CULTURES: Resp 5/17 >> NOF RVP 5/17 >> POS metapneumovirus and rhinovirus Blood 5/17 >>  Pleural fluid 5/18 >>   ANTIBIOTICS: Vanc 5/17 >> 5/19 Zosyn 5/17 >> 5/19 Ceftriaxone 5/19 >>   SUBJECTIVE: RN reports weaning on PSV 5/5 30%,  CBG's running in 60's.    VITAL SIGNS: Temp:  [97.7 F (36.5 C)-100.5 F (38.1 C)] 97.7 F (36.5 C) (05/20 0900) Pulse Rate:  [88-130] 102 (05/20 0900) Resp:  [0-40] 14 (05/20 0900) BP: (116-166)/(48-110) 143/52 mmHg (05/20 0900) SpO2:  [95 %-100 %] 98 % (05/20 0700) FiO2 (%):  [30 %-80 %] 30 % (05/20 0822) Weight:  [213 lb 3 oz (96.7 kg)] 213 lb 3 oz (96.7 kg) (05/20 0300)  VENTILATOR SETTINGS: Vent Mode:  [-]  PSV;CPAP FiO2 (%):  [30 %-80 %] 30 % Set Rate:  [16 bmp] 16 bmp Vt Set:  [550 mL] 550 mL PEEP:  [5 cmH20] 5 cmH20 Pressure Support:  [5 cmH20] 5 cmH20 Plateau Pressure:  [14 cmH20-18 cmH20] 15 cmH20  INTAKE / OUTPUT: Intake/Output     05/19 0701 - 05/20 0700 05/20 0701 - 05/21 0700   I.V. (mL/kg) 358.1 (3.7)    NG/GT 150    IV Piggyback 243    Total Intake(mL/kg) 751.1 (7.8)    Urine (mL/kg/hr) 6100 (2.6)    Other     Total Output 6100     Net -5348.9            PHYSICAL EXAMINATION: General:  Chronically ill in NAD  Neuro:  MAEs, arouses to verbal stimuli HEENT: OET[place], poor dentition Cardiovascular:  RRR, no M noted Lungs:  scatterd rhonchi, no wheezes Abdomen: soft , BS hypoactive Ext:  LE w/ old surgical scars.    LABS: I have reviewed all of today's lab results. Relevant abnormalities are discussed in the A/P section   CXR: 5/20 edema pattern. Small to mod R effusion   ASSESSMENT / PLAN:  PULMONARY A:  Chronic hypoxic resp failure COPD, emphysema ILD, NOS Chronic PAH Acute resp failure   Edema and/or viral pneumonitis B pleural effusions - s/p thoracentesis on R > transudate Failed extubation 5/19 Poor candidate for trach tube P:   Cont full vent support - settings reviewed and/or adjusted Wean in PSV mode as tolerated Cont  vent bundle Daily SBT if/when meets criteria Diuresis to extent permitted by BP / Sr Cr Will discuss with family goals of care prior to extubation  Favor one way extubation  CARDIOVASCULAR A:  Acute on chronic diastolic CHF HTN  P:  Holding anti-hypertensives Cont PRN hydralazine  RENAL A:  Acute on CRI P:   Monitor BMET intermittently Monitor I/Os Correct electrolytes as indicated Lasix 40 mg x 2 5/20  GASTROINTESTINAL A:   Mild LFT elevation - suspect congestion P:   SUP: IV PPI Begin TFs Monitor LFTs intermittently  HEMATOLOGIC A:   Mild anemia without acute blood loss P:  DVT px: SQ heparin Monitor  CBC intermittently Transfuse per usual ICU guidelines  INFECTIOUS A:   Metapneumovirus pneumonitis Purulent bronchitis P:   Micro and abx as above  ENDOCRINE A:   DM 2 Hypoglycemia, resolved P:   Cont SSI  NEUROLOGIC A:   Acute encephalopathy  ICU associated discomfort P:   Sedation protocol Daily WUA  Goal Rass 0   40 mins CCM time   Noe Gens, NP-C [facility] Pgr: [phone] or [phone]  Merton Border, MD ; [facility] service Mobile [phone].  After 5:30 PM or weekends, call [phone]  01/01/2014, 9:47 AM

## 2014-01-01 NOTE — Progress Notes (Signed)
NUTRITION FOLLOW UP  Intervention:    Utilize 17M PEPuP Protocol: initiate TF via OGT with Vital High Protein at 25 ml/h and Prostat 30 ml TID on day 1; on day 2, increase to goal rate of 50 ml/h (1200 ml per day) to provide 1500 kcals, 150 gm protein, 1003 ml free water daily.   Above TF goal regimen plus Propofol will provide a total of 1664 kcals/day (23 kcals/kg ideal weight).  Nutrition Dx:   Inadequate oral intake related to inability to eat as evidenced by NPO status. Ongoing.  Goal:   Enteral nutrition to provide 60-70% of estimated calorie needs (22-25 kcals/kg ideal body weight) and 100% of estimated protein needs, based on ASPEN guidelines for permissive underfeeding in critically ill obese individuals, unmet with TF off.  Monitor:   TF tolerance/adequacy, weight trend, labs, vent status.  Assessment:   Patient is a 80 y.o. male history of CHF, chronic kidney disease, diabetes mellitus, hypertension, diastolic CHF, was brought to the ER on 5/17 because of increasing lower extremity edema and pain. X-ray showed moderate right-sided pleural effusion with possible atelectasis versus infiltrates. Patient required intubation on admission.  Nutrition Focused Physical Exam:  Subcutaneous Fat:  Orbital Region: WNL Upper Arm Region: WNL Thoracic and Lumbar Region: NA  Muscle:  Temple Region: WNL Clavicle Bone Region: mild depletion Clavicle and Acromion Bone Region: WNL Scapular Bone Region: NA Dorsal Hand: WNL Patellar Region: WNL Anterior Thigh Region: WNL Posterior Calf Region: WNL   Patient was extubated on 5/19, but required re-intubation shortly thereafter. TF has been on hold since extubation. Discussed patient in ICU rounds today. Plans to resume TF today. Family meeting hopefully tomorrow. Patient is currently intubated on ventilator support MV: 8.3 L/min Temp (24hrs), Avg:99.4 F (37.4 C), Min:97.7 F (36.5 C), Max:100.5 F (38.1 C)  Propofol: 6.2 ml/hr  providing 164 kcals/day.  Height: Ht Readings from Last 1 Encounters:  12/29/13 5\' 9"  (1.753 m)    Weight Status:  Trending down with negative fluid status. Wt Readings from Last 1 Encounters:  01/01/14 213 lb 3 oz (96.7 kg)  12/30/13  234 lb 9.1 oz (106.4 kg)    Body mass index is 31.47 kg/(m^2).   Re-estimated needs:  Kcal: 1870 Protein: >/= 145 gm Fluid: 2 L  Skin: blisters to left and right legs  Diet Order: NPO   Intake/Output Summary (Last 24 hours) at 01/01/14 1213 Last data filed at 01/01/14 1100  Gross per 24 hour  Intake 442.92 ml  Output   5700 ml  Net -5257.08 ml    Last BM: PTA   Labs:   Recent Labs Lab 12/30/13 1110 12/31/13 0314 12/31/13 2323 01/01/14 0255  NA  --  144 146 147  K  --  3.7 3.6* 4.4  CL  --  106 107 106  CO2  --  27 28 27   BUN  --  24* 29* 29*  CREATININE  --  1.73* 1.80* 1.73*  CALCIUM  --  8.1* 8.3* 8.5  MG 1.9 1.9  --   --   PHOS 3.5 2.5  --   --   GLUCOSE  --  95 83 73    CBG (last 3)   Recent Labs  01/01/14 0821 01/01/14 1042 01/01/14 1149  GLUCAP 63* 68* 93    Scheduled Meds: . budesonide  0.25 mg Nebulization Q6H  . cefTRIAXone (ROCEPHIN)  IV  1 g Intravenous Q24H  . chlorhexidine  15 mL Mouth/Throat BID  .  collagenase   Topical Daily  . free water  200 mL Per Tube 3 times per day  . furosemide  40 mg Intravenous Once  . heparin  5,000 Units Subcutaneous 3 times per day  . ipratropium-albuterol  3 mL Nebulization Q6H  . pantoprazole (PROTONIX) IV  40 mg Intravenous QHS  . potassium chloride  20 mEq Per Tube Once  . sodium chloride  3 mL Intravenous Q12H    Continuous Infusions: . propofol 10 mcg/kg/min (01/01/14 1100)    Molli Barrows, RD, LDN, [place] Pager [phone] After Hours Pager [phone]

## 2014-01-02 ENCOUNTER — Inpatient Hospital Stay (HOSPITAL_COMMUNITY): Payer: Medicare Other

## 2014-01-02 LAB — CBC
HEMATOCRIT: 38.8 % — AB (ref 39.0–52.0)
Hemoglobin: 12.4 g/dL — ABNORMAL LOW (ref 13.0–17.0)
MCH: 32.4 pg (ref 26.0–34.0)
MCHC: 32 g/dL (ref 30.0–36.0)
MCV: 101.3 fL — ABNORMAL HIGH (ref 78.0–100.0)
PLATELETS: 211 10*3/uL (ref 150–400)
RBC: 3.83 MIL/uL — AB (ref 4.22–5.81)
RDW: 15.7 % — ABNORMAL HIGH (ref 11.5–15.5)
WBC: 7.4 10*3/uL (ref 4.0–10.5)

## 2014-01-02 LAB — GLUCOSE, CAPILLARY
GLUCOSE-CAPILLARY: 74 mg/dL (ref 70–99)
GLUCOSE-CAPILLARY: 104 mg/dL — AB (ref 70–99)
Glucose-Capillary: 108 mg/dL — ABNORMAL HIGH (ref 70–99)
Glucose-Capillary: 121 mg/dL — ABNORMAL HIGH (ref 70–99)
Glucose-Capillary: 86 mg/dL (ref 70–99)
Glucose-Capillary: 93 mg/dL (ref 70–99)

## 2014-01-02 LAB — BODY FLUID CULTURE: CULTURE: NO GROWTH

## 2014-01-02 LAB — BASIC METABOLIC PANEL
BUN: 29 mg/dL — ABNORMAL HIGH (ref 6–23)
CALCIUM: 8.7 mg/dL (ref 8.4–10.5)
CO2: 31 meq/L (ref 19–32)
Chloride: 107 mEq/L (ref 96–112)
Creatinine, Ser: 1.62 mg/dL — ABNORMAL HIGH (ref 0.50–1.35)
GFR calc Af Amer: 44 mL/min — ABNORMAL LOW (ref >90)
GFR calc non Af Amer: 38 mL/min — ABNORMAL LOW (ref >90)
Glucose, Bld: 85 mg/dL (ref 70–99)
Potassium: 3.5 mEq/L — ABNORMAL LOW (ref 3.7–5.3)
Sodium: 149 mEq/L — ABNORMAL HIGH (ref 137–147)

## 2014-01-02 MED ORDER — FENTANYL CITRATE 0.05 MG/ML IJ SOLN
12.5000 ug | INTRAMUSCULAR | Status: DC | PRN
  Administered 2014-01-02 – 2014-01-04 (×9): 50 ug via INTRAVENOUS
  Filled 2014-01-02 (×9): qty 2

## 2014-01-02 MED ORDER — FENTANYL CITRATE 0.05 MG/ML IJ SOLN
12.5000 ug | INTRAMUSCULAR | Status: DC | PRN
  Administered 2014-01-02: 25 ug via INTRAVENOUS
  Filled 2014-01-02: qty 2

## 2014-01-02 MED ORDER — POTASSIUM CHLORIDE 2 MEQ/ML IV SOLN
INTRAVENOUS | Status: DC
  Administered 2014-01-02 – 2014-01-03 (×2): via INTRAVENOUS
  Filled 2014-01-02 (×3): qty 1000

## 2014-01-02 MED ORDER — POTASSIUM CHLORIDE 10 MEQ/100ML IV SOLN
10.0000 meq | INTRAVENOUS | Status: AC
  Administered 2014-01-02 (×4): 10 meq via INTRAVENOUS
  Filled 2014-01-02: qty 100

## 2014-01-02 MED ORDER — PROPOFOL 10 MG/ML IV EMUL: 5.0000 ug/kg/min | INTRAVENOUS | Status: AC

## 2014-01-02 MED ORDER — FUROSEMIDE 10 MG/ML IJ SOLN
40.0000 mg | Freq: Once | INTRAMUSCULAR | Status: AC
  Administered 2014-01-02: 40 mg via INTRAVENOUS
  Filled 2014-01-02: qty 4

## 2014-01-02 MED ORDER — POTASSIUM CHLORIDE 20 MEQ/15ML (10%) PO LIQD
20.0000 meq | ORAL | Status: AC
  Administered 2014-01-02 (×2): 20 meq
  Filled 2014-01-02 (×2): qty 15

## 2014-01-02 MED ORDER — POTASSIUM CHLORIDE 20 MEQ/15ML (10%) PO LIQD
40.0000 meq | Freq: Once | ORAL | Status: DC
  Filled 2014-01-02 (×2): qty 30

## 2014-01-02 NOTE — Progress Notes (Signed)
Chaplain received referral from [employer] and RN for withdrawal of care at 2pm. Presented to pt's large family, including partner and son. Remained with family during withdrawal of care, providing pastoral presence and emotional support. Family declined prayer. Chaplain available for follow up as needed.   Ethelene Browns [phone]

## 2014-01-02 NOTE — Progress Notes (Signed)
PULMONARY / CRITICAL CARE MEDICINE   Name: Aubert Choyce MRN: [id number] DOB: May 28, 1931   PCP [name], MD Cardiology: Dr Adrian Prows Pulmonary: Dr Ann Lions  ADMISSION DATE:  12/29/2013   REFERRING MD :  EDP PRIMARY SERVICE: PCCM   CHIEF COMPLAINT:  Acute Resp Failure   BRIEF PATIENT DESCRIPTION:  83 M with history of CHF, chronic kidney disease, diabetes mellitus, hypertension admitted 5/17 to PCCM service via ED with acute on chronic resp failure due to pulm edema and/or PNA superimposed on chronic lung disease  SIGNIFICANT EVENTS / STUDIES:  5/17 - CT chest: Loculation of the large right and small left pleural effusions favors exudative effusion. This does not necessarily imply empyema. Mild mediastinal adenopathy. Emphysema. Patchy bilateral airspace opacities in the upper and lower lobes  could be from bilateral multi lobar pneumonia or edema. The presence of secondary pulmonary lobular septal thickening in the lung apices and cardiomegaly tends favor cardiogenic pulmonary edema. 5/18 - Echocardiogram:  5/18 - R thoracentesis: 1700 cc light amber fluid removed. LDH 90. Protein 1.9. 118 WBC, 62% lymphs 5/19 - Passed SBT. Failed NPPV after planned extubation. Re-intubated 5/21 Conference with extended family. Explained that medical condition has been optimized to extent possible. Agree to proceed with extubation, do not re-intubate, DNR, continue full medical care, change to comfort measures if failing medical therapy  LINES / TUBES: 5/17 ETT >> 5/19 (planned), 5/19 >> 5/21  CULTURES: Resp 5/17 >> NOF RVP 5/17 >> POS metapneumovirus and rhinovirus Blood 5/17 >> NEG Pleural fluid 5/18 >> NEG  ANTIBIOTICS: Vanc 5/17 >> 5/19 Zosyn 5/17 >> 5/19 Ceftriaxone 5/19 >>   SUBJECTIVE:  RASS -1. + F/C intermittently. Passed SBT    VITAL SIGNS: Temp:  [98 F (36.7 C)-99 F (37.2 C)] 98.6 F (37 C) (05/21 1251) Pulse Rate:  [89-110] 110 (05/21 1400) Resp:  [0-25] 24  (05/21 1400) BP: (106-150)/(48-72) 147/65 mmHg (05/21 1400) SpO2:  [97 %-100 %] 99 % (05/21 1506) FiO2 (%):  [30 %] 30 % (05/21 1200) Weight:  [89.9 kg (198 lb 3.1 oz)] 89.9 kg (198 lb 3.1 oz) (05/21 0423)  VENTILATOR SETTINGS: Vent Mode:  [-] PSV;CPAP FiO2 (%):  [30 %] 30 % Set Rate:  [16 bmp] 16 bmp Vt Set:  [550 mL] 550 mL PEEP:  [5 cmH20] 5 cmH20 Pressure Support:  [10 cmH20] 10 cmH20 Plateau Pressure:  [18 cmH20] 18 cmH20  INTAKE / OUTPUT: Intake/Output     05/20 0701 - 05/21 0700 05/21 0701 - 05/22 0700   I.V. (mL/kg) 461 (5.1) 192.8 (2.1)   NG/GT 425 350   IV Piggyback 50 50   Total Intake(mL/kg) 936 (10.4) 592.8 (6.6)   Urine (mL/kg/hr) 4700 (2.2) 800 (0.9)   Total Output 4700 800   Net -3764 -207.2          PHYSICAL EXAMINATION: General:  RASS -1, NAD  Neuro:  No focal deficits HEENT: [place] Cardiovascular:  RRR, no M noted Lungs:  scatterd rhonchi, no wheezes Abdomen: soft , BS hypoactive Ext:  LE w/ old surgical scars, mild symmetric edema   LABS: I have reviewed all of today's lab results. Relevant abnormalities are discussed in the A/P section   CXR: [place] edema, bilat atx  ASSESSMENT / PLAN:  PULMONARY A:  Chronic hypoxic resp failure COPD, emphysema ILD, NOS Chronic PAH Acute resp failure Pulm edema Metapneumovirus pneumonitis B pleural effusions - s/p thoracentesis on R > transudate Failed extubation 5/19 Do not re-intubate P:  Extubated 5/21 Post extubation resp care in ICU Cont BDs  CARDIOVASCULAR A:  Acute on chronic diastolic CHF HTN  P:  Holding anti-hypertensives Cont PRN hydralazine  RENAL A:  Acute on CRI P:   Monitor BMET intermittently Monitor I/Os Correct electrolytes as indicated Repeat Lasix  GASTROINTESTINAL A:   Mild LFT elevation - suspect congestion P:   SUP: IV PPI Begin TFs Monitor LFTs intermittently  HEMATOLOGIC A:   Mild anemia without acute blood loss P:  DVT px: SQ heparin Monitor CBC  intermittently Transfuse per usual ICU guidelines  INFECTIOUS A:   Metapneumovirus pneumonitis Purulent bronchitis P:   Micro and abx as above  ENDOCRINE A:   DM 2 Hypoglycemia, resolved P:   Resume SSI for glu > 180  NEUROLOGIC A:   Acute encephalopathy  ICU associated discomfort P:   Sedation protocol DC'd post ext PRN fentanyl   40 mins CCM time   Merton Border, MD ; [facility] service Mobile [phone].  After 5:30 PM or weekends, call [phone]  01/02/2014, 4:46 PM

## 2014-01-02 NOTE — Progress Notes (Signed)
[facility] ADULT ICU REPLACEMENT PROTOCOL FOR AM LAB REPLACEMENT ONLY  The patient does apply for the [facility] Adult ICU Electrolyte Replacment Protocol based on the criteria listed below:   1. Is GFR >/= 40 ml/min? yes  Patient's GFR today is 44 2. Is urine output >/= 0.5 ml/kg/hr for the last 6 hours? yes Patient's UOP is 1.48 ml/kg/hr 3. Is BUN < 60 mg/dL? yes  Patient's BUN today is 29 4. Abnormal electrolyte  K 3.5 5. Ordered repletion with: per protocol 6. If a panic level lab has been reported, has the CCM MD in charge been notified? yes.   Physician:  Dr Lorita Officer McEachran Lacrystal Barbe 01/02/2014 5:36 AM

## 2014-01-02 NOTE — Procedures (Signed)
Extubation Procedure Note  Patient Details:   Name: [name] [name] DOB: [birth year] MRN: [id number]   Airway Documentation:     Evaluation  O2 sats: stable throughout Complications: No apparent complications Patient did tolerate procedure well. Bilateral Breath Sounds: Diminished;Rhonchi Suctioning: Airway No  Pt extubated to [place] per MD order.  Pt placed on 4l [place], RN at bedside.  Pt is now a DNR. RT will continue to monitor.  Deetta Perla Closson 01/02/2014, 2:18 PM

## 2014-01-03 ENCOUNTER — Inpatient Hospital Stay (HOSPITAL_COMMUNITY): Payer: Medicare Other

## 2014-01-03 DIAGNOSIS — J449 Chronic obstructive pulmonary disease, unspecified: Secondary | ICD-10-CM

## 2014-01-03 LAB — BASIC METABOLIC PANEL
BUN: 30 mg/dL — ABNORMAL HIGH (ref 6–23)
CALCIUM: 8.8 mg/dL (ref 8.4–10.5)
CO2: 31 mEq/L (ref 19–32)
CREATININE: 1.48 mg/dL — AB (ref 0.50–1.35)
Chloride: 105 mEq/L (ref 96–112)
GFR calc Af Amer: 49 mL/min — ABNORMAL LOW (ref >90)
GFR, EST NON AFRICAN AMERICAN: 43 mL/min — AB (ref >90)
Glucose, Bld: 92 mg/dL (ref 70–99)
Potassium: 4.3 mEq/L (ref 3.7–5.3)
SODIUM: 148 meq/L — AB (ref 137–147)

## 2014-01-03 LAB — CBC
HCT: 38.8 % — ABNORMAL LOW (ref 39.0–52.0)
Hemoglobin: 12.9 g/dL — ABNORMAL LOW (ref 13.0–17.0)
MCH: 33.2 pg (ref 26.0–34.0)
MCHC: 33.2 g/dL (ref 30.0–36.0)
MCV: 99.7 fL (ref 78.0–100.0)
PLATELETS: 247 10*3/uL (ref 150–400)
RBC: 3.89 MIL/uL — ABNORMAL LOW (ref 4.22–5.81)
RDW: 15.9 % — AB (ref 11.5–15.5)
WBC: 9.5 10*3/uL (ref 4.0–10.5)

## 2014-01-03 LAB — GLUCOSE, CAPILLARY
Glucose-Capillary: 111 mg/dL — ABNORMAL HIGH (ref 70–99)
Glucose-Capillary: 168 mg/dL — ABNORMAL HIGH (ref 70–99)

## 2014-01-03 MED ORDER — WHITE PETROLATUM GEL
Status: AC
  Administered 2014-01-03: 09:00:00
  Filled 2014-01-03: qty 5

## 2014-01-03 MED ORDER — ACETAMINOPHEN 325 MG PO TABS
650.0000 mg | ORAL_TABLET | Freq: Four times a day (QID) | ORAL | Status: DC | PRN
  Administered 2014-01-04 – 2014-01-08 (×5): 650 mg via ORAL
  Filled 2014-01-03 (×6): qty 2

## 2014-01-03 MED ORDER — FUROSEMIDE 10 MG/ML IJ SOLN
40.0000 mg | Freq: Four times a day (QID) | INTRAMUSCULAR | Status: AC
  Administered 2014-01-03 (×2): 40 mg via INTRAVENOUS
  Filled 2014-01-03 (×2): qty 4

## 2014-01-03 NOTE — Progress Notes (Signed)
PULMONARY / CRITICAL CARE MEDICINE   Name: [name] [name] MRN: [id number] DOB: 03-30-1931   PCP [name], MD Cardiology: Dr Adrian Prows Pulmonary: Dr Ann Lions  ADMISSION DATE:  12/29/2013   REFERRING MD :  EDP PRIMARY SERVICE: PCCM   CHIEF COMPLAINT:  Acute Resp Failure   BRIEF PATIENT DESCRIPTION:  89 M with history of CHF, chronic kidney disease, diabetes mellitus, hypertension admitted 5/17 to PCCM service via ED with acute on chronic resp failure due to pulm edema and/or PNA superimposed on chronic lung disease  SIGNIFICANT EVENTS / STUDIES:  5/17 - CT chest: Loculation of the large right and small left pleural effusions favors exudative effusion. This does not necessarily imply empyema. Mild mediastinal adenopathy. Emphysema. Patchy bilateral airspace opacities in the upper and lower lobes  could be from bilateral multi lobar pneumonia or edema. The presence of secondary pulmonary lobular septal thickening in the lung apices and cardiomegaly tends favor cardiogenic pulmonary edema. 5/18 - Echocardiogram: LVEF 45%. Mild global HK. LV diastolic dysfunction. LA moderately to severe dilated 5/18 - R thoracentesis: 1700 cc light amber fluid removed. LDH 90. Protein 1.9. 118 WBC, 62% lymphs 5/19 - Passed SBT. Failed NPPV after planned extubation. Re-intubated 5/21 Conference with extended family. Explained that medical condition has been optimized to extent possible. Agree to proceed with extubation, do not re-intubate, DNR, continue full medical care, change to comfort measures if failing medical therapy  LINES / TUBES: 5/17 ETT >> 5/19 (planned), 5/19 >> 5/21  CULTURES: Resp 5/17 >> NOF RVP 5/17 >> POS metapneumovirus and rhinovirus Blood 5/17 >> NEG Pleural fluid 5/18 >> NEG  ANTIBIOTICS: Vanc 5/17 >> 5/19 Zosyn 5/17 >> 5/19 Ceftriaxone 5/19 >>   SUBJECTIVE:  Has tolerated extubation reasonably well. Occasional dyspnea associated with agitation. + F/C  VITAL  SIGNS: Temp:  [97.9 F (36.6 C)-98.3 F (36.8 C)] 98.3 F (36.8 C) (05/22 1224) Pulse Rate:  [98-119] 106 (05/22 1300) Resp:  [14-38] 26 (05/22 1300) BP: (123-157)/(40-102) 141/64 mmHg (05/22 1300) SpO2:  [93 %-100 %] 96 % (05/22 1300) Weight:  [88.4 kg (194 lb 14.2 oz)] 88.4 kg (194 lb 14.2 oz) (05/22 0420)  VENTILATOR SETTINGS:    INTAKE / OUTPUT: Intake/Output     05/21 0701 - 05/22 0700 05/22 0701 - 05/23 0700   I.V. (mL/kg) 1042.8 (11.8) 300 (3.4)   NG/GT 350    IV Piggyback 450 50   Total Intake(mL/kg) 1842.8 (20.8) 350 (4)   Urine (mL/kg/hr) 2875 (1.4) 500 (0.8)   Total Output 2875 500   Net -1032.2 -150          PHYSICAL EXAMINATION: General:  RASS 0 to +1, NAD  Neuro:  No focal deficits HEENT: [place] Cardiovascular:  RRR, no M noted Lungs: dependent rales Abdomen: soft , BS hypoactive Ext:  LE w/ old surgical scars, minimal symmetric edema, B foot ulcers   LABS: I have reviewed all of today's lab results. Relevant abnormalities are discussed in the A/P section   CXR: increased edema and bilateral effusions  ASSESSMENT / PLAN:  PULMONARY A:  Chronic hypoxic resp failure COPD, emphysema ILD, NOS Chronic PAH Acute resp failure Pulm edema Metapneumovirus pneumonitis B pleural effusions - s/p R thoracentesis > transudate Do not re-intubate P:   Cont supp O2 Cont BDs Lasix X 2 doses 5/22  CARDIOVASCULAR A:  Acute on chronic systolic and diastolic CHF HTN  P:  Holding anti-hypertensives Cont PRN hydralazine  RENAL A:  Acute on CRI Hypervolemia P:  Monitor BMET intermittently Monitor I/Os Correct electrolytes as indicated Cont diuresis to extent permitted by BP and renal function  GASTROINTESTINAL A:   Mild LFT elevation P:   SUP: IV PPI Begin TFs Monitor LFTs intermittently  HEMATOLOGIC A:   Mild anemia without acute blood loss P:  DVT px: SQ heparin Monitor CBC intermittently Transfuse per usual guidelines  INFECTIOUS A:    Metapneumovirus pneumonitis Purulent bronchitis P:   Micro and abx as above  ENDOCRINE A:   DM 2 Hypoglycemia, resolved P:   Resume SSI for glu > 180  NEUROLOGIC A:   Acute encephalopathy  ICU associated discomfort P:   Cont low dose PRN fentanyl   Transfer to SDU. Remains on PCCM since he is Dr Golden Pop office pt   Merton Border, MD ; [facility] [phone].  After 5:30 PM or weekends, call [phone]  01/03/2014, 2:25 PM

## 2014-01-03 NOTE — Progress Notes (Signed)
NUTRITION FOLLOW UP  Intervention:    Continue CHO-modified diet. RD to monitor PO intake and add supplements as needed for poor intake.  Nutrition Dx:   Inadequate oral intake related to recent intubation as evidenced by diet just advanced. Ongoing.  Goal:   Intake to meet >90% of estimated nutrition needs.  Monitor:   PO intake, labs, weight trend.  Assessment:   Patient is a 80 y.o. male history of CHF, chronic kidney disease, diabetes mellitus, hypertension, diastolic CHF, was brought to the ER on 5/17 because of increasing lower extremity edema and pain. X-ray showed moderate right-sided pleural effusion with possible atelectasis versus infiltrates. Patient required intubation on admission.  Patient was extubated on 5/21. TF off since extubation. Discussed patient in ICU rounds today. Diet advancing to CHO-modified today. Patient is hungry, requesting "a sandwich."   Height: Ht Readings from Last 1 Encounters:  12/29/13 5\' 9"  (1.753 m)    Weight Status:  Trending down with negative fluid status. Wt Readings from Last 1 Encounters:  01/03/14 194 lb 14.2 oz (88.4 kg)  12/30/13  234 lb 9.1 oz (106.4 kg)    Body mass index is 28.77 kg/(m^2).   Re-estimated needs:  Kcal: 1800-2000 Protein:90-105 gm Fluid: 2 L  Skin: blisters to left and right legs  Diet Order: Carb Control   Intake/Output Summary (Last 24 hours) at 01/03/14 1209 Last data filed at 01/03/14 0900  Gross per 24 hour  Intake 1584.13 ml  Output   2575 ml  Net -990.87 ml    Last BM: PTA   Labs:   Recent Labs Lab 12/30/13 1110 12/31/13 0314  01/01/14 0255 01/02/14 0306 01/03/14 0420  NA  --  144  < > 147 149* 148*  K  --  3.7  < > 4.4 3.5* 4.3  CL  --  106  < > 106 107 105  CO2  --  27  < > 27 31 31   BUN  --  24*  < > 29* 29* 30*  CREATININE  --  1.73*  < > 1.73* 1.62* 1.48*  CALCIUM  --  8.1*  < > 8.5 8.7 8.8  MG 1.9 1.9  --   --   --   --   PHOS 3.5 2.5  --   --   --   --    GLUCOSE  --  95  < > 73 85 92  < > = values in this interval not displayed.  CBG (last 3)   Recent Labs  01/02/14 1546 01/02/14 2000 01/03/14 0758  GLUCAP 108* 86 111*    Scheduled Meds: . budesonide  0.25 mg Nebulization Q6H  . cefTRIAXone (ROCEPHIN)  IV  1 g Intravenous Q24H  . chlorhexidine  15 mL Mouth/Throat BID  . collagenase   Topical Daily  . furosemide  40 mg Intravenous Q6H  . heparin  5,000 Units Subcutaneous 3 times per day  . ipratropium-albuterol  3 mL Nebulization Q6H  . sodium chloride  3 mL Intravenous Q12H  . white petrolatum        Continuous Infusions: . dextrose 5 % with kcl 50 mL/hr at 01/02/14 [place], RD, LDN, [place] Pager [phone] After Hours Pager [phone]

## 2014-01-04 DIAGNOSIS — N183 Chronic kidney disease, stage 3 unspecified: Secondary | ICD-10-CM

## 2014-01-04 LAB — CULTURE, BLOOD (ROUTINE X 2)
CULTURE: NO GROWTH
Culture: NO GROWTH

## 2014-01-04 LAB — BASIC METABOLIC PANEL
BUN: 32 mg/dL — AB (ref 6–23)
CALCIUM: 8.8 mg/dL (ref 8.4–10.5)
CO2: 32 meq/L (ref 19–32)
CREATININE: 1.51 mg/dL — AB (ref 0.50–1.35)
Chloride: 103 mEq/L (ref 96–112)
GFR calc Af Amer: 48 mL/min — ABNORMAL LOW (ref >90)
GFR, EST NON AFRICAN AMERICAN: 42 mL/min — AB (ref >90)
GLUCOSE: 116 mg/dL — AB (ref 70–99)
Potassium: 4.2 mEq/L (ref 3.7–5.3)
SODIUM: 145 meq/L (ref 137–147)

## 2014-01-04 LAB — GLUCOSE, CAPILLARY
Glucose-Capillary: 114 mg/dL — ABNORMAL HIGH (ref 70–99)
Glucose-Capillary: 103 mg/dL — ABNORMAL HIGH (ref 70–99)

## 2014-01-04 MED ORDER — FUROSEMIDE 10 MG/ML IJ SOLN
40.0000 mg | Freq: Two times a day (BID) | INTRAMUSCULAR | Status: DC
  Administered 2014-01-04 – 2014-01-07 (×6): 40 mg via INTRAVENOUS
  Filled 2014-01-04 (×10): qty 4

## 2014-01-04 MED ORDER — BUDESONIDE 0.25 MG/2ML IN SUSP
0.5000 mg | Freq: Two times a day (BID) | RESPIRATORY_TRACT | Status: DC
  Administered 2014-01-04: 0.5 mg via RESPIRATORY_TRACT
  Filled 2014-01-04 (×4): qty 4

## 2014-01-04 NOTE — Progress Notes (Signed)
PULMONARY / CRITICAL CARE MEDICINE   Name: [name] [name] MRN: [id number] DOB: 04/20/1931   PCP [name], MD Cardiology: Dr Adrian Prows Pulmonary: Dr Ann Lions  ADMISSION DATE:  12/29/2013   REFERRING MD :  EDP PRIMARY SERVICE: PCCM   CHIEF COMPLAINT:  Acute Resp Failure   BRIEF PATIENT DESCRIPTION:  89 M with history of CHF, chronic kidney disease, diabetes mellitus, hypertension admitted 5/17 to PCCM service via ED with acute on chronic resp failure due to pulm edema and/or PNA superimposed on chronic lung disease  SIGNIFICANT EVENTS / STUDIES:  5/17 - CT chest: Loculation of the large right and small left pleural effusions favors exudative effusion. This does not necessarily imply empyema. Mild mediastinal adenopathy. Emphysema. Patchy bilateral airspace opacities in the upper and lower lobes  could be from bilateral multi lobar pneumonia or edema. The presence of secondary pulmonary lobular septal thickening in the lung apices and cardiomegaly tends favor cardiogenic pulmonary edema. 5/18 - Echocardiogram: LVEF 45%. Mild global HK. LV diastolic dysfunction. LA moderately to severe dilated 5/18 - R thoracentesis: 1700 cc light amber fluid removed. LDH 90. Protein 1.9. 118 WBC, 62% lymphs 5/19 - Passed SBT. Failed NPPV after planned extubation. Re-intubated 5/21 Conference with extended family. Explained that medical condition has been optimized to extent possible. Agree to proceed with extubation, do not re-intubate, DNR, continue full medical care, change to comfort measures if failing medical therapy  LINES / TUBES: 5/17 ETT >> 5/19 (planned), 5/19 >> 5/21  CULTURES: Resp 5/17 >> NOF RVP 5/17 >> POS metapneumovirus and rhinovirus Blood 5/17 >> NEG Pleural fluid 5/18 >> NEG  ANTIBIOTICS: Vanc 5/17 >> 5/19 Zosyn 5/17 >> 5/19 Ceftriaxone 5/19 >>   SUBJECTIVE:  Has tolerated extubation reasonably well.  Afebrile Denies chest pain or dyspnea Sitting up in  bed  VITAL SIGNS: Temp:  [97.5 F (36.4 C)-98.3 F (36.8 C)] 97.8 F (36.6 C) (05/23 0800) Pulse Rate:  [91-107] 93 (05/23 0800) Resp:  [13-36] 18 (05/23 0800) BP: (101-153)/(48-82) 124/51 mmHg (05/23 0800) SpO2:  [94 %-99 %] 99 % (05/23 0800) Weight:  [85.5 kg (188 lb 7.9 oz)] 85.5 kg (188 lb 7.9 oz) (05/23 0417)  VENTILATOR SETTINGS:    INTAKE / OUTPUT: Intake/Output     05/22 0701 - 05/23 0700 05/23 0701 - 05/24 0700   I.V. (mL/kg) 1150 (13.5) 50 (0.6)   NG/GT     IV Piggyback 50    Total Intake(mL/kg) 1200 (14) 50 (0.6)   Urine (mL/kg/hr) 3100 (1.5)    Total Output 3100     Net -1900 +50          PHYSICAL EXAMINATION: General:  RASS 0, NAD  Neuro:  No focal deficits HEENT: [place] Cardiovascular:  RRR, no M noted Lungs: dependent rales Abdomen: soft , BS hypoactive Ext:  LE w/ old surgical scars, minimal symmetric edema, B foot ulcers -chronic ischemic changes   LABS: I have reviewed all of today's lab results. Relevant abnormalities are discussed in the A/P section BMET    Component Value Date/Time   NA 145 01/04/2014 0250   K 4.2 01/04/2014 0250   CL 103 01/04/2014 0250   CO2 32 01/04/2014 0250   GLUCOSE 116* 01/04/2014 0250   BUN 32* 01/04/2014 0250   CREATININE 1.51* 01/04/2014 0250   CALCIUM 8.8 01/04/2014 0250   GFRNONAA 42* 01/04/2014 0250   GFRAA 48* 01/04/2014 0250       CXR: increased edema and bilateral effusions  ASSESSMENT /  PLAN:  PULMONARY A:  Chronic hypoxic resp failure COPD, emphysema ILD, NOS Chronic PAH Acute resp failure Pulm edema Metapneumovirus pneumonitis B pleural effusions - s/p R thoracentesis > transudate Do not re-intubate P:   Cont supp O2 Cont BDs   CARDIOVASCULAR A:  Acute on chronic systolic and diastolic CHF HTN  P:  Holding anti-hypertensives Cont PRN hydralazine  RENAL A:  Acute on CRI Hypervolemia P:   Monitor BMET intermittently Monitor I/Os Correct electrolytes as indicated Cont lasix  to  extent permitted by BP and renal function  GASTROINTESTINAL A:   Mild LFT elevation P:   SUP: IV PPI PO advanced Monitor LFTs intermittently  HEMATOLOGIC A:   Mild anemia without acute blood loss P:  DVT px: SQ heparin Monitor CBC intermittently Transfuse per usual guidelines  INFECTIOUS A:   Metapneumovirus pneumonitis Purulent bronchitis P:   Micro and abx as above  ENDOCRINE A:   DM 2 Hypoglycemia, resolved P:   Resume SSI for glu > 180  NEUROLOGIC A:   Acute encephalopathy  ICU associated discomfort P:   Cont low dose PRN fentanyl   Transfer to tele. Remains on PCCM since he is Dr Golden Pop office pt  Kara Mead MD. [facility]. [place] [facility] Pager [phone] If no response call [phone]    01/04/2014, 9:20 AM

## 2014-01-04 NOTE — Evaluation (Signed)
Physical Therapy Evaluation Patient Details Name: [name] [name] MRN: [id number] DOB: [birth year] Today's Date: 01/04/2014   History of Present Illness  55 M with history of CHF, chronic kidney disease, diabetes mellitus, hypertension admitted 5/17 to PCCM service via ED with acute on chronic resp failure due to pulm edema and/or PNA superimposed on chronic lung disease. Intubated 5/17-5/22.  Clinical Impression  Pt admitted with above. Pt currently with functional limitations due to the deficits listed below (see PT Problem List).  Pt will benefit from skilled PT to increase their independence and safety with mobility to allow discharge to the venue listed below. Pt has not been this weak previously.     Follow Up Recommendations SNF    Equipment Recommendations  None recommended by PT    Recommendations for Other Services       Precautions / Restrictions Precautions Precautions: Fall      Mobility  Bed Mobility Overal bed mobility: Needs Assistance Bed Mobility: Supine to Sit;Sit to Supine     Supine to sit: Mod assist;HOB elevated Sit to supine: Mod assist   General bed mobility comments: Assist to bring legs off and trunk up to sitting. Assist to bring legs back up into bed.  Transfers                    Ambulation/Gait                Stairs            Wheelchair Mobility    Modified Rankin (Stroke Patients Only)       Balance Overall balance assessment: Needs assistance Sitting-balance support: Feet supported Sitting balance-Leahy Scale: Poor Sitting balance - Comments: Sat EOB x 15 minutes with min A to supervision.                                     Pertinent Vitals/Pain SaO2 98% on RA.    Home Living Family/patient expects to be discharged to:: Skilled nursing facility Living Arrangements: Children               Additional Comments: Pt was living with son.    Prior Function Level of Independence:  Independent with assistive device(s)         Comments: Amb with rolling walker.     Hand Dominance   Dominant Hand: Right    Extremity/Trunk Assessment   Upper Extremity Assessment: Generalized weakness           Lower Extremity Assessment: Generalized weakness         Communication   Communication: No difficulties  Cognition Arousal/Alertness: Awake/alert Behavior During Therapy: WFL for tasks assessed/performed Overall Cognitive Status: Impaired/Different from baseline Area of Impairment: Orientation;Memory;Problem solving Orientation Level: Disoriented to;Place;Time;Situation   Memory: Decreased recall of precautions;Decreased short-term memory       Problem Solving: Slow processing;Requires verbal cues;Requires tactile cues      General Comments      Exercises        Assessment/Plan    PT Assessment Patient needs continued PT services  PT Diagnosis Difficulty walking;Generalized weakness;Acute pain   PT Problem List Decreased strength;Decreased activity tolerance;Decreased balance;Decreased mobility;Decreased cognition;Decreased knowledge of use of DME;Pain  PT Treatment Interventions DME instruction;Gait training;Functional mobility training;Therapeutic activities;Therapeutic exercise;Balance training;Patient/family education;Cognitive remediation   PT Goals (Current goals can be found in the Care Plan section) Acute Rehab PT Goals Patient Stated Goal:  Pt didn't state PT Goal Formulation: With patient Time For Goal Achievement: 01/11/14 Potential to Achieve Goals: Fair    Frequency Min 3X/week   Barriers to discharge Decreased caregiver support      Co-evaluation               End of Session   Activity Tolerance: Patient limited by fatigue;Patient limited by pain Patient left: in bed;with call bell/phone within reach;with family/visitor present Nurse Communication: Mobility status         Time: 5170-0174 PT Time Calculation  (min): 31 min   Charges:   PT Evaluation $Initial PT Evaluation Tier I: 1 Procedure PT Treatments $Therapeutic Activity: 8-22 mins   PT G CodesShary Decamp [place] [name] 01/04/2014, 12:41 PM  [employer] PT [phone]

## 2014-01-05 ENCOUNTER — Inpatient Hospital Stay (HOSPITAL_COMMUNITY): Payer: Medicare Other

## 2014-01-05 DIAGNOSIS — IMO0001 Reserved for inherently not codable concepts without codable children: Secondary | ICD-10-CM

## 2014-01-05 DIAGNOSIS — E1165 Type 2 diabetes mellitus with hyperglycemia: Secondary | ICD-10-CM

## 2014-01-05 LAB — GLUCOSE, CAPILLARY
Glucose-Capillary: 86 mg/dL (ref 70–99)
Glucose-Capillary: 100 mg/dL — ABNORMAL HIGH (ref 70–99)
Glucose-Capillary: 120 mg/dL — ABNORMAL HIGH (ref 70–99)

## 2014-01-05 LAB — BASIC METABOLIC PANEL
BUN: 32 mg/dL — ABNORMAL HIGH (ref 6–23)
CHLORIDE: 96 meq/L (ref 96–112)
CO2: 28 mEq/L (ref 19–32)
CREATININE: 1.46 mg/dL — AB (ref 0.50–1.35)
Calcium: 8.6 mg/dL (ref 8.4–10.5)
GFR calc non Af Amer: 43 mL/min — ABNORMAL LOW (ref >90)
GFR, EST AFRICAN AMERICAN: 50 mL/min — AB (ref >90)
GLUCOSE: 88 mg/dL (ref 70–99)
POTASSIUM: 5.8 meq/L — AB (ref 3.7–5.3)
Sodium: 136 mEq/L — ABNORMAL LOW (ref 137–147)

## 2014-01-05 LAB — CBC
HEMATOCRIT: 39.5 % (ref 39.0–52.0)
HEMOGLOBIN: 12.8 g/dL — AB (ref 13.0–17.0)
MCH: 32.4 pg (ref 26.0–34.0)
MCHC: 32.4 g/dL (ref 30.0–36.0)
MCV: 100 fL (ref 78.0–100.0)
Platelets: 262 10*3/uL (ref 150–400)
RBC: 3.95 MIL/uL — ABNORMAL LOW (ref 4.22–5.81)
RDW: 14.6 % (ref 11.5–15.5)
WBC: 7 10*3/uL (ref 4.0–10.5)

## 2014-01-05 MED ORDER — BUDESONIDE 0.5 MG/2ML IN SUSP
0.5000 mg | Freq: Two times a day (BID) | RESPIRATORY_TRACT | Status: DC
  Administered 2014-01-05 – 2014-01-08 (×5): 0.5 mg via RESPIRATORY_TRACT
  Filled 2014-01-05 (×8): qty 2

## 2014-01-05 NOTE — Progress Notes (Signed)
PULMONARY / CRITICAL CARE MEDICINE   Name: [name] [name] MRN: [id number] DOB: March 30, 1931   PCP [name], MD Cardiology: Dr Adrian Prows Pulmonary: Dr Ann Lions  ADMISSION DATE:  12/29/2013   REFERRING MD :  EDP PRIMARY SERVICE: PCCM   CHIEF COMPLAINT:  Acute Resp Failure   BRIEF PATIENT DESCRIPTION:  33 M with history of CHF, chronic kidney disease, diabetes mellitus, hypertension admitted 5/17 to PCCM service via ED with acute on chronic resp failure due to pulm edema and/or PNA superimposed on chronic lung disease  SIGNIFICANT EVENTS / STUDIES:  5/17 - CT chest: Loculation of the large right and small left pleural effusions favors exudative effusion. This does not necessarily imply empyema. Mild mediastinal adenopathy. Emphysema. Patchy bilateral airspace opacities in the upper and lower lobes  could be from bilateral multi lobar pneumonia or edema. The presence of secondary pulmonary lobular septal thickening in the lung apices and cardiomegaly tends favor cardiogenic pulmonary edema. 5/18 - Echocardiogram: LVEF 45%. Mild global HK. LV diastolic dysfunction. LA moderately to severe dilated 5/18 - R thoracentesis: 1700 cc light amber fluid removed. LDH 90. Protein 1.9. 118 WBC, 62% lymphs 5/19 - Passed SBT. Failed NPPV after planned extubation. Re-intubated 5/21 Conference with extended family. Explained that medical condition has been optimized to extent possible. Agree to proceed with extubation, do not re-intubate, DNR, continue full medical care, change to comfort measures if failing medical therapy  LINES / TUBES: 5/17 ETT >> 5/19 (planned), 5/19 >> 5/21  CULTURES: Resp 5/17 >> NOF RVP 5/17 >> POS metapneumovirus and rhinovirus Blood 5/17 >> NEG Pleural fluid 5/18 >> NEG  ANTIBIOTICS: Vanc 5/17 >> 5/19 Zosyn 5/17 >> 5/19 Ceftriaxone 5/19 >>   SUBJECTIVE:  Asleep at this visit. No report of new issues.  VITAL SIGNS: Temp:  [97 F (36.1 C)-97.3 F (36.3 C)]  97.3 F (36.3 C) (05/24 0330) Pulse Rate:  [44-104] 97 (05/24 0330) Resp:  [18-22] 18 (05/24 0330) BP: (107-142)/(45-73) 142/55 mmHg (05/24 0330) SpO2:  [90 %-100 %] 93 % (05/24 0330) Weight:  [191 lb 2.2 oz (86.7 kg)] 191 lb 2.2 oz (86.7 kg) (05/24 0330)  VENTILATOR SETTINGS:    INTAKE / OUTPUT: Intake/Output     05/23 0701 - 05/24 0700 05/24 0701 - 05/25 0700   P.O. 730    I.V. (mL/kg) 156 (1.8)    IV Piggyback 50    Total Intake(mL/kg) 936 (10.8)    Urine (mL/kg/hr) 1675 (0.8)    Total Output 1675     Net -739            PHYSICAL EXAMINATION: General: NAD, asleep, not responsive to light touch or voice Neuro: Padded mittens for self-protection HEENT: [place] Cardiovascular:  RRR, no M noted Lungs: coarse w/o rhonchi, unlabored on room air Abdomen: soft , BS hypoactive Ext:  LE w/ old surgical scars, minimal symmetric edema, B foot ulcers -chronic ischemic changes   LABS:  BMET    Component Value Date/Time   NA 136* 01/05/2014 0450   K 5.8* 01/05/2014 0450   CL 96 01/05/2014 0450   CO2 28 01/05/2014 0450   GLUCOSE 88 01/05/2014 0450   BUN 32* 01/05/2014 0450   CREATININE 1.46* 01/05/2014 0450   CALCIUM 8.6 01/05/2014 0450   GFRNONAA 43* 01/05/2014 0450   GFRAA 50* 01/05/2014 0450       CXR: increased edema and bilateral effusions  ASSESSMENT / PLAN:  PULMONARY Sat 93% on room air A:  Chronic hypoxic resp failure  COPD, emphysema ILD, NOS Chronic PAH Acute resp failure Pulm edema Metapneumovirus pneumonitis B pleural effusions - s/p R thoracentesis > transudate Do not re-intubate P:   Cont BDs   CARDIOVASCULAR A:  Acute on chronic systolic and diastolic CHF HTN  P:  Holding anti-hypertensives Cont PRN hydralazine  RENAL A:  Acute on CRI Hypervolemia P:   Monitor BMET intermittently Monitor I/Os Correct electrolytes as indicated Cont lasix  to extent permitted by BP and renal function  GASTROINTESTINAL A:   Mild LFT elevation P:   SUP:  IV PPI PO advanced Monitor LFTs intermittently  HEMATOLOGIC A:   Mild anemia without acute blood loss P:  DVT px: SQ heparin Monitor CBC intermittently Transfuse per usual guidelines  INFECTIOUS A:   Metapneumovirus pneumonitis Purulent bronchitis P:   Micro and abx as above  ENDOCRINE A:   DM 2 Hypoglycemia, resolved P:   Resume SSI for glu > 180  NEUROLOGIC A:   Acute encephalopathy  ICU associated discomfort P:   Cont low dose PRN fentanyl Mittens for self-protection   Transferred to tele. Remains on PCCM since he is Dr Golden Pop office pt  CD Annamaria Boots, MD [facility] Pager [phone] If no response call [phone]    01/05/2014, 9:48 AM

## 2014-01-06 LAB — GLUCOSE, CAPILLARY
GLUCOSE-CAPILLARY: 124 mg/dL — AB (ref 70–99)
GLUCOSE-CAPILLARY: 96 mg/dL (ref 70–99)
Glucose-Capillary: 94 mg/dL (ref 70–99)

## 2014-01-06 NOTE — Progress Notes (Signed)
PULMONARY / CRITICAL CARE MEDICINE   Name: [name] [name] MRN: [id number] DOB: Nov 20, 1930   PCP [name], MD Cardiology: Dr Adrian Prows Pulmonary: Dr Ann Lions  ADMISSION DATE:  12/29/2013   REFERRING MD :  EDP PRIMARY SERVICE: PCCM   CHIEF COMPLAINT:  Acute Resp Failure   BRIEF PATIENT DESCRIPTION:  68 yobm with history of CHF, chronic kidney disease, diabetes mellitus, hypertension admitted 5/17 to PCCM service via ED with acute on chronic resp failure due to pulm edema and/or PNA superimposed on chronic lung disease> NCB status after extubated 5/21  SIGNIFICANT EVENTS / STUDIES:  5/17 - CT chest: Loculation of the large right and small left pleural effusions favors exudative effusion. This does not necessarily imply empyema. Mild mediastinal adenopathy. Emphysema. Patchy bilateral airspace opacities in the upper and lower lobes  could be from bilateral multi lobar pneumonia or edema. The presence of secondary pulmonary lobular septal thickening in the lung apices and cardiomegaly tends favor cardiogenic pulmonary edema. 5/18 - Echocardiogram: LVEF 45%. Mild global HK. LV diastolic dysfunction. LA moderately to severe dilated 5/18 - R thoracentesis: 1700 cc light amber fluid removed. LDH 90. Protein 1.9. 118 WBC, 62% lymphs 5/19 - Passed SBT. Failed NPPV after planned extubation. Re-intubated 5/21 Conference with extended family. Explained that medical condition has been optimized to extent possible. Agree to proceed with extubation, do not re-intubate, DNR, continue full medical care, change to comfort measures if failing medical therapy  LINES / TUBES: 5/17 ETT >> 5/19 (planned), 5/19 >> 5/21  CULTURES: Resp 5/17 >> NOF RVP 5/17 >> POS metapneumovirus and rhinovirus Blood 5/17 >> Neg Pleural fluid 5/18 >> Neg  ANTIBIOTICS: Vanc 5/17 >> 5/19 Zosyn 5/17 >> 5/19 Ceftriaxone 5/19 >>   SUBJECTIVE:  Nad lying almost flat in bed   VITAL SIGNS: Temp:  [97.5 F (36.4  C)-98.3 F (36.8 C)] 98.3 F (36.8 C) (05/25 0440) Pulse Rate:  [86-96] 86 (05/25 0440) Resp:  [18-19] 18 (05/25 0440) BP: (134-146)/(53-62) 146/62 mmHg (05/25 0440) SpO2:  [92 %-95 %] 94 % (05/25 0440) Weight:  [191 lb 2.2 oz (86.7 kg)] 191 lb 2.2 oz (86.7 kg) (05/25 0440) FIO2  RA  VENTILATOR SETTINGS:    INTAKE / OUTPUT: Intake/Output     05/24 0701 - 05/25 0700 05/25 0701 - 05/26 0700   P.O. 180 120   I.V. (mL/kg)     IV Piggyback     Total Intake(mL/kg) 180 (2.1) 120 (1.4)   Urine (mL/kg/hr)     Total Output       Net +180 +120        Urine Occurrence 1 x    Stool Occurrence  1 x     PHYSICAL EXAMINATION: General: NAD /chronically ill  Neuro: Padded mittens for self-protection HEENT: [place] Cardiovascular:  RRR, no M noted Lungs: coarse w/o rhonchi, unlabored on room air Abdomen: soft , BS hypoactive Ext:  LE w/ old surgical scars, minimal symmetric edema, B foot ulcers -chronic ischemic changes   LABS:    Recent Labs Lab 01/03/14 0420 01/04/14 0250 01/05/14 0450  NA 148* 145 136*  K 4.3 4.2 5.8*  CL 105 103 96  CO2 31 32 28  BUN 30* 32* 32*  CREATININE 1.48* 1.51* 1.46*  GLUCOSE 92 116* 88    Recent Labs Lab 01/02/14 0306 01/03/14 0420 01/05/14 0450  HGB 12.4* 12.9* 12.8*  HCT 38.8* 38.8* 39.5  WBC 7.4 9.5 7.0  PLT 211 247 262  ASSESSMENT / PLAN:  PULMONARY   A:  Chronic hypoxic resp failure COPD, emphysema ILD, NOS Chronic PAH Acute resp failure Pulm edema Metapneumovirus pneumonitis B pleural effusions - s/p R thoracentesis > transudate Do not re-intubate P:   Cont BDs   CARDIOVASCULAR A:  Acute on chronic systolic and diastolic CHF HTN  P:  Holding anti-hypertensives Cont PRN hydralazine  RENAL A:  Acute on CRI Hypervolemia Hyperkalemia  P:   Monitor BMET intermittently     GASTROINTESTINAL A:   Mild LFT elevation P:   SUP: IV PPI PO advanced Monitor LFTs intermittently  HEMATOLOGIC A:    Mild anemia without acute blood loss P:  DVT px: SQ heparin Monitor CBC intermittently Transfuse per usual guidelines  INFECTIOUS A:   Metapneumovirus pneumonitis Purulent bronchitis P:   Micro and abx as above  ENDOCRINE A:   DM 2 Hypoglycemia, resolved P:   Resume SSI for glu > 180  NEUROLOGIC A:   Acute encephalopathy  ICU associated discomfort P:   Cont low dose PRN fentanyl Mittens for self-protection

## 2014-01-06 NOTE — Clinical Social Work Placement (Deleted)
Clinical Social Work Department CLINICAL SOCIAL WORK PLACEMENT NOTE 01/06/2014  Patient:  GAIL, CREEKMORE  Account Number:  [id number] [place] date:  12/29/2013  Clinical Social Worker:  Wylene Men  Date/time:  01/06/2014 01:04 PM  Clinical Social Work is seeking post-discharge placement for this patient at the following level of care:   SKILLED NURSING   (*CSW will update this form in Epic as items are completed)   01/06/2014  Patient/family provided with [place] Department of Clinical Social Work's list of facilities offering this level of care within the geographic area requested by the patient (or if unable, by the patient's family).  01/06/2014  Patient/family informed of their freedom to choose among providers that offer the needed level of care, that participate in Medicare, Medicaid or managed care program needed by the patient, have an available bed and are willing to accept the patient.  01/06/2014  Patient/family informed of [facility]' ownership interest in [facility], as well as of the fact that they are under no obligation to receive care at this facility.  PASARR submitted to EDS on 01/06/2014 PASARR number received from EDS on 01/06/2014  FL2 transmitted to all facilities in geographic area requested by pt/family on  01/06/2014 FL2 transmitted to all facilities within larger geographic area on   Patient informed that his/her managed care company has contracts with or will negotiate with  certain facilities, including the following:     Patient/family informed of bed offers received:   Patient chooses bed at  Physician recommends and patient chooses bed at    Patient to be transferred to  on   Patient to be transferred to facility by   The following physician request were entered in Epic:   Additional Comments:  Nonnie Done, [place] [phone]  Clinical Social Work

## 2014-01-07 DIAGNOSIS — J449 Chronic obstructive pulmonary disease, unspecified: Secondary | ICD-10-CM

## 2014-01-07 DIAGNOSIS — J1289 Other viral pneumonia: Secondary | ICD-10-CM

## 2014-01-07 DIAGNOSIS — J9600 Acute respiratory failure, unspecified whether with hypoxia or hypercapnia: Secondary | ICD-10-CM

## 2014-01-07 DIAGNOSIS — I509 Heart failure, unspecified: Secondary | ICD-10-CM

## 2014-01-07 DIAGNOSIS — I5033 Acute on chronic diastolic (congestive) heart failure: Secondary | ICD-10-CM

## 2014-01-07 DIAGNOSIS — J90 Pleural effusion, not elsewhere classified: Secondary | ICD-10-CM

## 2014-01-07 LAB — GLUCOSE, CAPILLARY
GLUCOSE-CAPILLARY: 168 mg/dL — AB (ref 70–99)
Glucose-Capillary: 108 mg/dL — ABNORMAL HIGH (ref 70–99)
Glucose-Capillary: 128 mg/dL — ABNORMAL HIGH (ref 70–99)

## 2014-01-07 MED ORDER — FENOFIBRATE 160 MG PO TABS
160.0000 mg | ORAL_TABLET | Freq: Every day | ORAL | Status: DC
  Administered 2014-01-07 – 2014-01-08 (×2): 160 mg via ORAL
  Filled 2014-01-07 (×2): qty 1

## 2014-01-07 MED ORDER — MIDAZOLAM HCL 2 MG/2ML IJ SOLN: 1.0000 mg | INTRAMUSCULAR | Status: DC | PRN

## 2014-01-07 MED ORDER — FENTANYL CITRATE 0.05 MG/ML IJ SOLN: 50.0000 ug | Freq: Once | INTRAMUSCULAR | Status: DC

## 2014-01-07 MED ORDER — FENTANYL BOLUS VIA INFUSION
25.0000 ug | INTRAVENOUS | Status: DC | PRN
  Filled 2014-01-07: qty 50

## 2014-01-07 MED ORDER — ASPIRIN EC 81 MG PO TBEC
81.0000 mg | DELAYED_RELEASE_TABLET | Freq: Every day | ORAL | Status: DC
  Administered 2014-01-07 – 2014-01-08 (×2): 81 mg via ORAL
  Filled 2014-01-07 (×2): qty 1

## 2014-01-07 MED ORDER — TORSEMIDE 20 MG PO TABS
20.0000 mg | ORAL_TABLET | Freq: Every day | ORAL | Status: DC
  Administered 2014-01-08: 20 mg via ORAL
  Filled 2014-01-07: qty 1

## 2014-01-07 MED ORDER — IPRATROPIUM-ALBUTEROL 0.5-2.5 (3) MG/3ML IN SOLN
3.0000 mL | Freq: Four times a day (QID) | RESPIRATORY_TRACT | Status: DC
  Administered 2014-01-07 – 2014-01-08 (×2): 3 mL via RESPIRATORY_TRACT
  Filled 2014-01-07 (×2): qty 3

## 2014-01-07 MED ORDER — METOPROLOL SUCCINATE ER 25 MG PO TB24
25.0000 mg | ORAL_TABLET | Freq: Every day | ORAL | Status: DC
  Administered 2014-01-07 – 2014-01-08 (×2): 25 mg via ORAL
  Filled 2014-01-07 (×2): qty 1

## 2014-01-07 MED ORDER — FENTANYL CITRATE 0.05 MG/ML IJ SOLN
0.0000 ug/h | INTRAMUSCULAR | Status: DC
  Filled 2014-01-07: qty 50

## 2014-01-07 MED ORDER — LISINOPRIL 2.5 MG PO TABS
2.5000 mg | ORAL_TABLET | Freq: Every day | ORAL | Status: DC
  Administered 2014-01-07 – 2014-01-08 (×2): 2.5 mg via ORAL
  Filled 2014-01-07 (×2): qty 1

## 2014-01-07 NOTE — Progress Notes (Signed)
Clinical Social Work Department BRIEF PSYCHOSOCIAL ASSESSMENT 01/07/2014  Patient:  MONTAGUE, CORELLA     Account Number:  [id number]     Admit date:  12/29/2013  Clinical Social Worker:  Megan Salon  Date/Time:  01/07/2014 01:25 PM  Referred by:  Physician  Date Referred:  01/07/2014 Referred for  SNF Placement   Other Referral:   Interview type:  Other - See comment Other interview type:   CSW spoke to patient's daughter by bedside    PSYCHOSOCIAL DATA Living Status:  ALONE Admitted from facility:   Level of care:   Primary support name:  Melody Walker Primary support relationship to patient:  CHILD, ADULT Degree of support available:   Good    CURRENT CONCERNS Current Concerns  Post-Acute Placement   Other Concerns:    SOCIAL WORK ASSESSMENT / PLAN Clinical Social Worker received referral for SNF placement at d/c. CSW introduced self and explained reason for visit. Patient's daughter was at bedside. CSW explained SNF process and provided SNF packet to patient and family. Patient's daughter reported she is agreeable for SNF placement for patient.  CSW will complete FL2 for MD's signature and will update patient and family when bed offers are received.   Assessment/plan status:  Psychosocial Support/Ongoing Assessment of Needs Other assessment/ plan:   Information/referral to community resources:   SNF information/packet    PATIENT'S/FAMILY'S RESPONSE TO PLAN OF CARE: Patient's daughter states that she would like SNF placement for patient.        Jeanette Caprice, MSW, [place]

## 2014-01-07 NOTE — Progress Notes (Signed)
CSW provided bed offers to patient's son over the phone. Patient's son states he would like to go with [facility]. CSW contacted [facility] and confirmed a bed for discharge. CSW continues to follow. FL2 and DNR form on chart for MD signature.  Jeanette Caprice, MSW, [place]

## 2014-01-07 NOTE — Progress Notes (Addendum)
Clinical Social Work Department CLINICAL SOCIAL WORK PLACEMENT NOTE 01/07/2014  Patient:  [name] [name], [name] [name]  Account Number:  [id number] [place] date:  12/29/2013  Clinical Social Worker:  Wylene Men  Date/time:  01/06/2014 01:04 PM  Clinical Social Work is seeking post-discharge placement for this patient at the following level of care:   SKILLED NURSING   (*CSW will update this form in Epic as items are completed)   01/06/2014  Patient/family provided with [place] Department of Clinical Social Work's list of facilities offering this level of care within the geographic area requested by the patient (or if unable, by the patient's family).  01/06/2014  Patient/family informed of their freedom to choose among providers that offer the needed level of care, that participate in Medicare, Medicaid or managed care program needed by the patient, have an available bed and are willing to accept the patient.  01/06/2014  Patient/family informed of [facility]' ownership interest in [facility], as well as of the fact that they are under no obligation to receive care at this facility.  PASARR submitted to EDS on 01/06/2014 PASARR number received from EDS on 01/06/2014  FL2 transmitted to all facilities in geographic area requested by pt/family on  01/06/2014 FL2 transmitted to all facilities within larger geographic area on   Patient informed that his/her managed care company has contracts with or will negotiate with  certain facilities, including the following:     Patient/family informed of bed offers received:  01/07/2014 Patient chooses bed at [facility] Physician recommends and patient chooses bed at    Patient to be transferred to  on  01/07/2014 Patient to be transferred to facility by EMS  The following physician request were entered in Epic:   Additional Comments:  Jeanette Caprice, MSW, [place]

## 2014-01-07 NOTE — Progress Notes (Signed)
PULMONARY / CRITICAL CARE MEDICINE   Name: [name] [name] MRN: [id number] DOB: [birth year]   PCP [name], MD Cardiology: Dr Adrian Prows Pulmonary: Dr Ann Lions  ADMISSION DATE:  12/29/2013  REFERRING MD :  EDP  CHIEF COMPLAINT:  Acute Resp Failure   BRIEF PATIENT DESCRIPTION:  80 yo presented with respiratory failure from PNA, CHF.  Intubated in ER.  Followed by [name] in pulmonary office.  SIGNIFICANT EVENTS: 5/17 Admit 5/19 VDRF >> extubated later that day >> later re-intubated 5/21 Family conference >> extubate and no re-intubation >> DNR, but continue other medical care 5/23 To medical floor bed  STUDIES:  5/17 CT chest >> Rt > Lt effusions, emphysema, patchy b/l ASD 5/17 Rt thoracentesis >> 1700 ml fluid, protein 1.9, LDH 90, WBC 118 5/18 Echocardiogram: LVEF 45%. Mild global HK. LV diastolic dysfunction. LA moderately to severe dilated  LINES / TUBES: 5/17 ETT >> 5/19 (planned), 5/19 >> 5/21  CULTURES: Resp 5/17 >> NOF RVP 5/17 >> POS metapneumovirus and rhinovirus Blood 5/17 >> Neg Pleural fluid 5/18 >> Neg  ANTIBIOTICS: Vanc 5/17 >> 5/19 Zosyn 5/17 >> 5/19 Ceftriaxone 5/19 >> off  SUBJECTIVE:  Nad lying almost flat in bed, no O2, Speech clear, CO leg pain.  VITAL SIGNS: Temp:  [97.9 F (36.6 C)-98.7 F (37.1 C)] 97.9 F (36.6 C) (05/26 0502) Pulse Rate:  [88-96] 91 (05/26 0502) Resp:  [18] 18 (05/26 0502) BP: (137-139)/(58-67) 139/58 mmHg (05/26 0502) SpO2:  [92 %-100 %] 100 % (05/26 0734) Weight:  [84.7 kg (186 lb 11.7 oz)] 84.7 kg (186 lb 11.7 oz) (05/26 0502)  INTAKE / OUTPUT: Intake/Output     05/25 0701 - 05/26 0700 05/26 0701 - 05/27 0700   P.O. 360    Total Intake(mL/kg) 360 (4.3)    Urine (mL/kg/hr) 1300 (0.6)    Total Output 1300     Net -940          Stool Occurrence 2 x      PHYSICAL EXAMINATION: General: NAD /chronically ill  Neuro: follows commands, interactive HEENT: [place] Cardiovascular:  RRR, no M  noted Lungs: coarse w/o rhonchi, unlabored on room air Abdomen: soft , BS hypoactive Ext:  LE w/ old surgical scars, minimal symmetric edema, B foot ulcers -chronic ischemic changes, dressings intact.   LABS: CBC Recent Labs     01/05/14  0450  WBC  7.0  HGB  12.8*  HCT  39.5  PLT  262   BMET Recent Labs     01/05/14  0450  NA  136*  K  5.8*  CL  96  CO2  28  BUN  32*  CREATININE  1.46*  GLUCOSE  88    Electrolytes Recent Labs     01/05/14  0450  CALCIUM  8.6   Glucose Recent Labs     01/05/14  1621  01/05/14  2107  01/06/14  0613  01/06/14  2109  01/07/14  0614  01/07/14  1107  GLUCAP  120*  124*  94  96  108*  128*    Imaging No results found.  ASSESSMENT / PLAN:  A: Acute respiratory failure 2nd to viral pneumonia (Metapneumovirus, rhinovirus), acute pulmonary edema, and b/l pleural effusions (transudate). Hx of COPD, ?ILD. P: Continue pulmicort Change duoneb to q6h while awake F/u CXR intermittently  A:  Acute on chronic systolic and diastolic CHF. HTN . P:  Resume ASA, zestril, metoprolol, fenofibrate, torsemide 5/26 Hold norvasc for now  A:  CKD. Hyperkalemia. P:   F/u BMET   A:   DM type II >> CBG's only mildly elevated. P:   Hold outpt linagliptin, replaglinide for now  A: Deconditioning. P: PT recommending SNF placement  Goals of care >> DNR/DNI.  Ready for d/c to SNF when bed available.  Updated family at bedside.  [name] ACNP [name] PCCM Pager [phone] till 3 pm If no answer page [phone] 01/07/2014, 9:03 AM  [name], MD [place] 01/07/2014, 3:18 PM Pager:  [phone] After 3pm call: [phone]

## 2014-01-07 NOTE — Progress Notes (Signed)
Physical Therapy Treatment Patient Details Name: [name] [name] MRN: [id number] DOB: August 25, 1930 Today's Date: 01/07/2014    History of Present Illness 6 M with history of CHF, chronic kidney disease, diabetes mellitus, hypertension admitted 5/17 to PCCM service via ED with acute on chronic resp failure due to pulm edema and/or PNA superimposed on chronic lung disease. Intubated 5/17-5/22.    PT Comments    Pt sleeping upon entering room.  Niece present and provided encouragement for pt to participate.  Pt only agreeable to sit EOB today however explained need to continue progressing mobility with next visit.  Pt progressed to sitting with supervision and wished to remain EOB with niece nearby upon leaving room.  Niece aware to use call bell to assist back to bed if needed (bed alarm on).   Follow Up Recommendations  SNF     Equipment Recommendations  None recommended by PT    Recommendations for Other Services       Precautions / Restrictions Precautions Precautions: Fall Restrictions Weight Bearing Restrictions: No    Mobility  Bed Mobility Overal bed mobility: Needs Assistance Bed Mobility: Sidelying to Sit   Sidelying to sit: Mod assist       General bed mobility comments: tactile and physical cues to bring LEs to EOB and then assist to bring trunk upright  Transfers                 General transfer comment: pt declined further mobility today however stated need to increase next visit  Ambulation/Gait                 Stairs            Wheelchair Mobility    Modified Rankin (Stroke Patients Only)       Balance Overall balance assessment: Needs assistance Sitting-balance support: Feet unsupported;Bilateral upper extremity supported Sitting balance-Leahy Scale: Fair Sitting balance - Comments: sat EOB for 5 min, able to progress from min assist to supervision with cues for UE support, pt reports increased LE pain however wished to remain  sitting (dressings to bil feet)                            Cognition Arousal/Alertness: Lethargic Behavior During Therapy: WFL for tasks assessed/performed Overall Cognitive Status: Impaired/Different from baseline Area of Impairment: Orientation;Memory;Problem solving Orientation Level: Disoriented to;Place;Time;Situation   Memory: Decreased short-term memory       Problem Solving: Slow processing;Requires verbal cues;Requires tactile cues General Comments: pt very lethargic, sleeping initially upon entering room, more awake/alert once sitting EOB    Exercises      General Comments        Pertinent Vitals/Pain bil feet pain (dressings on both feet), activity to tolerance    Home Living                      Prior Function            PT Goals (current goals can now be found in the care plan section) Progress towards PT goals: Not progressing toward goals - comment (pt declined further progress today)    Frequency  Min 3X/week    PT Plan Current plan remains appropriate    Co-evaluation             End of Session   Activity Tolerance: Patient limited by fatigue;Patient limited by pain Patient left: in bed;with call bell/phone within reach;with family/visitor  present     Time: 6579-0383 PT Time Calculation (min): 10 min  Charges:  $Therapeutic Activity: 8-22 mins                    G Codes:      Junius Argyle 01/07/2014, 12:39 PM Carmelia Bake, PT, DPT 01/07/2014 Pager: [phone]

## 2014-01-08 LAB — BASIC METABOLIC PANEL
BUN: 26 mg/dL — ABNORMAL HIGH (ref 6–23)
CHLORIDE: 97 meq/L (ref 96–112)
CO2: 28 meq/L (ref 19–32)
Calcium: 8.6 mg/dL (ref 8.4–10.5)
Creatinine, Ser: 1.35 mg/dL (ref 0.50–1.35)
GFR calc Af Amer: 55 mL/min — ABNORMAL LOW (ref >90)
GFR calc non Af Amer: 48 mL/min — ABNORMAL LOW (ref >90)
Glucose, Bld: 89 mg/dL (ref 70–99)
POTASSIUM: 3.2 meq/L — AB (ref 3.7–5.3)
SODIUM: 136 meq/L — AB (ref 137–147)

## 2014-01-08 LAB — CBC
HCT: 36.6 % — ABNORMAL LOW (ref 39.0–52.0)
HEMOGLOBIN: 12.5 g/dL — AB (ref 13.0–17.0)
MCH: 32.5 pg (ref 26.0–34.0)
MCHC: 34.2 g/dL (ref 30.0–36.0)
MCV: 95.1 fL (ref 78.0–100.0)
Platelets: 362 10*3/uL (ref 150–400)
RBC: 3.85 MIL/uL — AB (ref 4.22–5.81)
RDW: 14.3 % (ref 11.5–15.5)
WBC: 7.9 10*3/uL (ref 4.0–10.5)

## 2014-01-08 LAB — GLUCOSE, CAPILLARY: GLUCOSE-CAPILLARY: 90 mg/dL (ref 70–99)

## 2014-01-08 MED ORDER — COLLAGENASE 250 UNIT/GM EX OINT: TOPICAL_OINTMENT | Freq: Every day | CUTANEOUS | Status: DC

## 2014-01-08 NOTE — Discharge Summary (Signed)
Physician Discharge Summary  Patient ID: Jw Covin MRN: [id number] DOB/AGE: Feb 08, 1931 80 y.o.  Admit date: 12/29/2013 Discharge date: 01/08/2014  Problem List Principal Problem:   Acute respiratory failure Active Problems:   Pleural effusion   Pulmonary edema   COPD (chronic obstructive pulmonary disease)   Pulmonary fibrosis   Human metapneumovirus pneumonia  HPI: [name] [name] is a 80 y.o. male history of CHF, chronic kidney disease, diabetes mellitus, hypertension was brought to the ER because of acute resp distress , attempted CPAP en route, unable to tolerate. Progressively worse in ER , requiring intubation.  CXR shows severe right sided aspdz , temp 103.9 . Pt has several LE wounds and weeping areas. Pt was started on Vanc and Zosyn . Currently sedated on vent .  Prior to intubation was following commands but agitated according to RN.  Followed by [name] in office , CT chest showed ILD changes .  Recent admit 10/2013 for acute CHF and CAP -RLL  Oct 05/2013 stress Myoview which was showing low risk and 2-D echo was showing EF of 55% with severe pulmonary hypertension.   Hospital Course: SIGNIFICANT EVENTS:  5/17 Admit  5/19 VDRF >> extubated later that day >> later re-intubated  5/21 Family conference >> extubate and no re-intubation >> DNR, but continue other medical care  5/23 To medical floor bed  STUDIES:  5/17 CT chest >> Rt > Lt effusions, emphysema, patchy b/l ASD  5/17 Rt thoracentesis >> 1700 ml fluid, protein 1.9, LDH 90, WBC 118  5/18 Echocardiogram: LVEF 45%. Mild global HK. LV diastolic dysfunction. LA moderately to severe dilated  LINES / TUBES:  5/17 ETT >> 5/19 (planned), 5/19 >> 5/21  CULTURES:  Resp 5/17 >> NOF  RVP 5/17 >> POS metapneumovirus and rhinovirus  Blood 5/17 >> Neg  Pleural fluid 5/18 >> Neg  ANTIBIOTICS:  Vanc 5/17 >> 5/19  Zosyn 5/17 >> 5/19  Ceftriaxone 5/19 >> off   ASSESSMENT / PLAN:  A:  Acute respiratory failure 2nd  to viral pneumonia (Metapneumovirus, rhinovirus), acute pulmonary edema, and b/l pleural effusions (transudate).  Hx of COPD, ?ILD.  P: Continue bronchodilators A:  Acute on chronic systolic and diastolic CHF.  HTN .  P:  Resume ASA, zestril, metoprolol, fenofibrate, torsemide 5/26  5/27 resume norvasc A:  CKD.  Lab Results  Component Value Date   CREATININE 1.35 01/08/2014   CREATININE 1.46* 01/05/2014   CREATININE 1.51* 01/04/2014    Recent Labs Lab 01/04/14 0250 01/05/14 0450 01/08/14 0350  K 4.2 5.8* 3.2*   CBG (last 3)   Recent Labs  01/07/14 1107 01/07/14 2113 01/08/14 0603  GLUCAP 128* 168* 90      Hyperkalemia.  P:  F/u BMET  A:  DM type II >> CBG's only mildly elevated.  P:  Hold outpt linagliptin, replaglinide for now \\Follow  up potassium level. A:  Deconditioning.  P: PT recommending SNF placement  Goals of care >> DNR/DNI.         Labs at discharge Lab Results  Component Value Date   CREATININE 1.35 01/08/2014   BUN 26* 01/08/2014   NA 136* 01/08/2014   K 3.2* 01/08/2014   CL 97 01/08/2014   CO2 28 01/08/2014   Lab Results  Component Value Date   WBC 7.9 01/08/2014   HGB 12.5* 01/08/2014   HCT 36.6* 01/08/2014   MCV 95.1 01/08/2014   PLT 362 01/08/2014   Lab Results  Component Value Date   ALT 33 12/31/2013  AST 64* 12/31/2013   ALKPHOS 33* 12/31/2013   BILITOT 1.9* 12/31/2013   No results found for this basename: INR, PROTIME    Current radiology studies No results found.  Disposition:  06-Home-Health Care Svc  Discharge Instructions   Discharge to SNF when bed available    Complete by:  As directed             Medication List    STOP taking these medications       linagliptin 5 MG Tabs tablet  Commonly known as:  TRADJENTA     repaglinide 1 MG tablet  Commonly known as:  PRANDIN     silver sulfADIAZINE 1 % cream  Commonly known as:  SILVADENE      TAKE these medications       albuterol 108 (90 BASE) MCG/ACT  inhaler  Commonly known as:  PROVENTIL HFA;VENTOLIN HFA  Inhale 2 puffs into the lungs every 4 (four) hours as needed for wheezing or shortness of breath.     albuterol (2.5 MG/3ML) 0.083% nebulizer solution  Commonly known as:  PROVENTIL  Take 2.5 mg by nebulization 2 (two) times daily as needed for wheezing or shortness of breath.     amLODipine 10 MG tablet  Commonly known as:  NORVASC  Take 10 mg by mouth daily.     aspirin EC 81 MG tablet  Take 81 mg by mouth daily.     collagenase ointment  Commonly known as:  SANTYL  Apply topically daily.     fenofibrate 160 MG tablet  Take 160 mg by mouth daily.     lisinopril 2.5 MG tablet  Commonly known as:  ZESTRIL  Take 1 tablet (2.5 mg total) by mouth daily.     metoprolol succinate 25 MG 24 hr tablet  Commonly known as:  TOPROL-XL  Take 25 mg by mouth daily.     torsemide 20 MG tablet  Commonly known as:  DEMADEX  Take 20 mg by mouth 2 (two) times daily.     TRAVATAN Z 0.004 % Soln ophthalmic solution  Generic drug:  Travoprost (BAK Free)  Place 1 drop into the left eye at bedtime.          Discharged Condition: poor  Time spent on discharge greater than 40 minutes.  Vital signs at Discharge. Temp:  [98 F (36.7 C)-98.7 F (37.1 C)] 98.7 F (37.1 C) (05/27 0500) Pulse Rate:  [79-96] 81 (05/27 0500) Resp:  [16-18] 18 (05/27 0500) BP: (124-133)/(50-62) 126/62 mmHg (05/27 0500) SpO2:  [93 %-98 %] 97 % (05/27 0500) Weight:  [86.1 kg (189 lb 13.1 oz)] 86.1 kg (189 lb 13.1 oz) (05/27 0500) Office follow up Special Information or instructions. Per skilled nursing facility. He will need oral antihyperglycemics reinstitution in near future.  Signed: [name] ACNP [name] PCCM Pager [phone] till 3 pm If no answer page [phone] 01/08/2014, 8:37 AM  [name], MD [place] 01/08/2014, 10:07 AM Pager:  [phone] After 3pm call: [phone]

## 2014-01-08 NOTE — Progress Notes (Signed)
Clinical Social Worker facilitated patient discharge including contacting patient, family and facility to confirm patient discharge plans.  Clinical information faxed to facility and family agreeable with plan.  CSW arranged ambulance transport via [facility] to [facility].  RN to call report prior to discharge.  Clinical Social Worker will sign off for now as social work intervention is no longer needed. Please consult [place] again if new need arises.  Jeanette Caprice, MSW, [place]

## 2014-01-09 ENCOUNTER — Emergency Department (HOSPITAL_COMMUNITY): Payer: Medicare Other

## 2014-01-09 ENCOUNTER — Encounter (HOSPITAL_COMMUNITY): Payer: Self-pay | Admitting: Emergency Medicine

## 2014-01-09 ENCOUNTER — Emergency Department (HOSPITAL_COMMUNITY)
Admission: EM | Admit: 2014-01-09 | Discharge: 2014-01-09 | Disposition: A | Discharge status: Home / Self Care | Payer: Medicare Other | Attending: Emergency Medicine | Admitting: Emergency Medicine

## 2014-01-09 DIAGNOSIS — I509 Heart failure, unspecified: Secondary | ICD-10-CM | POA: Diagnosis not present

## 2014-01-09 DIAGNOSIS — Z8739 Personal history of other diseases of the musculoskeletal system and connective tissue: Secondary | ICD-10-CM | POA: Insufficient documentation

## 2014-01-09 DIAGNOSIS — Z7982 Long term (current) use of aspirin: Secondary | ICD-10-CM | POA: Diagnosis not present

## 2014-01-09 DIAGNOSIS — J449 Chronic obstructive pulmonary disease, unspecified: Secondary | ICD-10-CM | POA: Diagnosis not present

## 2014-01-09 DIAGNOSIS — I129 Hypertensive chronic kidney disease with stage 1 through stage 4 chronic kidney disease, or unspecified chronic kidney disease: Secondary | ICD-10-CM | POA: Insufficient documentation

## 2014-01-09 DIAGNOSIS — Z79899 Other long term (current) drug therapy: Secondary | ICD-10-CM | POA: Diagnosis not present

## 2014-01-09 DIAGNOSIS — Z8701 Personal history of pneumonia (recurrent): Secondary | ICD-10-CM | POA: Insufficient documentation

## 2014-01-09 DIAGNOSIS — N189 Chronic kidney disease, unspecified: Secondary | ICD-10-CM | POA: Insufficient documentation

## 2014-01-09 DIAGNOSIS — E119 Type 2 diabetes mellitus without complications: Secondary | ICD-10-CM | POA: Insufficient documentation

## 2014-01-09 DIAGNOSIS — Y9210 Unspecified residential institution as the place of occurrence of the external cause: Secondary | ICD-10-CM | POA: Diagnosis not present

## 2014-01-09 DIAGNOSIS — Z87891 Personal history of nicotine dependence: Secondary | ICD-10-CM | POA: Diagnosis not present

## 2014-01-09 DIAGNOSIS — R55 Syncope and collapse: Secondary | ICD-10-CM | POA: Insufficient documentation

## 2014-01-09 DIAGNOSIS — W19XXXA Unspecified fall, initial encounter: Secondary | ICD-10-CM | POA: Diagnosis not present

## 2014-01-09 DIAGNOSIS — S0990XA Unspecified injury of head, initial encounter: Secondary | ICD-10-CM | POA: Insufficient documentation

## 2014-01-09 DIAGNOSIS — Y939 Activity, unspecified: Secondary | ICD-10-CM | POA: Insufficient documentation

## 2014-01-09 DIAGNOSIS — R079 Chest pain, unspecified: Secondary | ICD-10-CM | POA: Insufficient documentation

## 2014-01-09 DIAGNOSIS — I2510 Atherosclerotic heart disease of native coronary artery without angina pectoris: Secondary | ICD-10-CM | POA: Insufficient documentation

## 2014-01-09 DIAGNOSIS — J4489 Other specified chronic obstructive pulmonary disease: Secondary | ICD-10-CM | POA: Insufficient documentation

## 2014-01-09 DIAGNOSIS — R4182 Altered mental status, unspecified: Secondary | ICD-10-CM | POA: Diagnosis present

## 2014-01-09 LAB — CBC WITH DIFFERENTIAL/PLATELET
Basophils Absolute: 0 10*3/uL (ref 0.0–0.1)
Basophils Relative: 1 % (ref 0–1)
Eosinophils Absolute: 0.3 10*3/uL (ref 0.0–0.7)
Eosinophils Relative: 7 % — ABNORMAL HIGH (ref 0–5)
HEMATOCRIT: 42.3 % (ref 39.0–52.0)
HEMOGLOBIN: 14.1 g/dL (ref 13.0–17.0)
LYMPHS PCT: 29 % (ref 12–46)
Lymphs Abs: 1.2 10*3/uL (ref 0.7–4.0)
MCH: 32.3 pg (ref 26.0–34.0)
MCHC: 33.3 g/dL (ref 30.0–36.0)
MCV: 96.8 fL (ref 78.0–100.0)
MONOS PCT: 10 % (ref 3–12)
Monocytes Absolute: 0.4 10*3/uL (ref 0.1–1.0)
NEUTROS ABS: 2.2 10*3/uL (ref 1.7–7.7)
NEUTROS PCT: 53 % (ref 43–77)
Platelets: ADEQUATE 10*3/uL (ref 150–400)
RBC: 4.37 MIL/uL (ref 4.22–5.81)
RDW: 14.1 % (ref 11.5–15.5)
Smear Review: ADEQUATE
WBC: 4.1 10*3/uL (ref 4.0–10.5)

## 2014-01-09 LAB — URINALYSIS, ROUTINE W REFLEX MICROSCOPIC
Bilirubin Urine: NEGATIVE
GLUCOSE, UA: NEGATIVE mg/dL
HGB URINE DIPSTICK: NEGATIVE
KETONES UR: NEGATIVE mg/dL
Nitrite: NEGATIVE
PH: 5 (ref 5.0–8.0)
Protein, ur: NEGATIVE mg/dL
Specific Gravity, Urine: 1.016 (ref 1.005–1.030)
Urobilinogen, UA: 1 mg/dL (ref 0.0–1.0)

## 2014-01-09 LAB — I-STAT TROPONIN, ED
Troponin i, poc: 0.02 ng/mL (ref 0.00–0.08)
Troponin i, poc: 0.04 ng/mL (ref 0.00–0.08)

## 2014-01-09 LAB — URINE MICROSCOPIC-ADD ON

## 2014-01-09 LAB — COMPREHENSIVE METABOLIC PANEL
ALT: 52 U/L (ref 0–53)
AST: 105 U/L — AB (ref 0–37)
Albumin: 3.1 g/dL — ABNORMAL LOW (ref 3.5–5.2)
Alkaline Phosphatase: 77 U/L (ref 39–117)
BILIRUBIN TOTAL: 1.8 mg/dL — AB (ref 0.3–1.2)
BUN: 28 mg/dL — AB (ref 6–23)
CHLORIDE: 94 meq/L — AB (ref 96–112)
CO2: 26 mEq/L (ref 19–32)
Calcium: 9.1 mg/dL (ref 8.4–10.5)
Creatinine, Ser: 1.35 mg/dL (ref 0.50–1.35)
GFR calc Af Amer: 55 mL/min — ABNORMAL LOW (ref >90)
GFR calc non Af Amer: 48 mL/min — ABNORMAL LOW (ref >90)
Glucose, Bld: 128 mg/dL — ABNORMAL HIGH (ref 70–99)
POTASSIUM: 3.5 meq/L — AB (ref 3.7–5.3)
Sodium: 134 mEq/L — ABNORMAL LOW (ref 137–147)
Total Protein: 8 g/dL (ref 6.0–8.3)

## 2014-01-09 LAB — I-STAT CG4 LACTIC ACID, ED: LACTIC ACID, VENOUS: 1.63 mmol/L (ref 0.5–2.2)

## 2014-01-09 LAB — CBG MONITORING, ED: GLUCOSE-CAPILLARY: 125 mg/dL — AB (ref 70–99)

## 2014-01-09 NOTE — ED Notes (Signed)
NOTIFIED E.POE,RN IN PERSON FOR PATIENTS LAB RESULTS OF CG4+ LACTIC ACID ,01/09/2014.

## 2014-01-09 NOTE — ED Notes (Signed)
To ED from [facility], per staff, pt was at baseline and then was unresponsive, was altered on EMS arrival but was quickly alert, some intermittent decreased LOC enroute, VSS, CBG 144, c/o CP

## 2014-01-09 NOTE — ED Notes (Signed)
Discharge instructions reviewed with Levada Dy RN at [place] facility. Levada Dy verbalized understanding.

## 2014-01-09 NOTE — ED Notes (Signed)
EMS gave 324 ASA pta

## 2014-01-09 NOTE — Discharge Instructions (Signed)

## 2014-01-09 NOTE — ED Notes (Signed)
The patient requested coffee and the tech gave a half of cup to the patient. The tech reported to the RN in charge.

## 2014-01-09 NOTE — ED Notes (Signed)
Patient transported to CT 

## 2014-01-09 NOTE — ED Provider Notes (Signed)
CSN: [id number]     Arrival date & time 01/09/14  1559 History   First MD Initiated Contact with Patient 01/09/14 1559     Chief Complaint  Patient presents with  . Altered Mental Status     (Consider location/radiation/quality/duration/timing/severity/associated sxs/prior Treatment) HPI Comments: Patient presents with a syncopal episode. He was recently in hospital for pneumonia. He had a prolonged hospital stay and pneumonia that required intubation. He was discharged to [facility]. Per his dietary he's been confused since he was extubated. While at nursing home today, he had a period of unresponsiveness, he fell to the ground, and he was unresponsive for a short period time. When he woke up he was alert but complaining of pain in his chest and his head. He currently is denying any pain to his chest or his head. He denies any other complaints. He denies any shortness of breath. There is no visualized seizure activity. He did have one episode in route with EMS where he was less responsive.  Patient is a 80 y.o. male presenting with altered mental status.  Altered Mental Status Associated symptoms: headaches   Associated symptoms: no abdominal pain, no fever, no nausea, no rash, no vomiting and no weakness     Past Medical History  Diagnosis Date  . Hypertension   . Shortness of breath     with exertion  . Diabetes mellitus     does not check his blood sugar at home  . Arthritis   . CHF (congestive heart failure)   . COPD (chronic obstructive pulmonary disease)   . Hyperlipidemia   . Chronic kidney disease     renal insuficiency  . Coronary artery disease    Past Surgical History  Procedure Laterality Date  . Leg surgery      both legs - unsure of exact operations - from trauma  . Tonsillectomy    . Appendectomy    . Cataract extraction w/phaco Left 09/04/2013    Procedure: CATARACT EXTRACTION PHACO AND INTRAOCULAR LENS PLACEMENT (IOC);  Surgeon: Adonis Brook, MD;   Location: [place];  Service: Ophthalmology;  Laterality: Left;   Family History  Problem Relation Age of Onset  . Diabetes Father   . Diabetes Mother    History  Substance Use Topics  . Smoking status: Former Smoker -- 0.50 packs/day for 70 years    Types: Cigarettes    Quit date: 05/14/2009  . Smokeless tobacco: Never Used  . Alcohol Use: No    Review of Systems  Constitutional: Negative for fever, chills, diaphoresis and fatigue.  HENT: Negative for congestion, rhinorrhea and sneezing.   Eyes: Negative.   Respiratory: Negative for cough, chest tightness and shortness of breath.   Cardiovascular: Positive for chest pain. Negative for leg swelling.  Gastrointestinal: Negative for nausea, vomiting, abdominal pain, diarrhea and blood in stool.  Genitourinary: Negative for frequency, hematuria, flank pain and difficulty urinating.  Musculoskeletal: Negative for arthralgias and back pain.  Skin: Negative for rash.  Neurological: Positive for syncope and headaches. Negative for dizziness, speech difficulty, weakness and numbness.      Allergies  Review of patient's allergies indicates no known allergies.  Home Medications   Prior to Admission medications   Medication Sig Start Date End Date Taking? Authorizing Provider  albuterol (PROVENTIL HFA;VENTOLIN HFA) 108 (90 BASE) MCG/ACT inhaler Inhale 2 puffs into the lungs every 4 (four) hours as needed for wheezing or shortness of breath.    Yes Historical Provider, MD  albuterol (PROVENTIL) (2.5 MG/3ML) 0.083% nebulizer solution Take 2.5 mg by nebulization 3 (three) times daily.    Yes Historical Provider, MD  albuterol (PROVENTIL) (2.5 MG/3ML) 0.083% nebulizer solution Take 2.5 mg by nebulization every 4 (four) hours as needed for wheezing or shortness of breath.   Yes Historical Provider, MD  amLODipine (NORVASC) 10 MG tablet Take 10 mg by mouth daily.   Yes Historical Provider, MD  aspirin EC 81 MG tablet Take 81 mg by mouth daily.    Yes Historical Provider, MD  fenofibrate 160 MG tablet Take 160 mg by mouth daily.   Yes Historical Provider, MD  lisinopril (ZESTRIL) 2.5 MG tablet Take 1 tablet (2.5 mg total) by mouth daily. 03/28/13  Yes Erline Hau, MD  metoprolol succinate (TOPROL-XL) 25 MG 24 hr tablet Take 25 mg by mouth daily. 07/04/13  Yes Historical Provider, MD  torsemide (DEMADEX) 20 MG tablet Take 20 mg by mouth 2 (two) times daily.   Yes Historical Provider, MD  TRAVATAN Z 0.004 % SOLN ophthalmic solution Place 1 drop into the left eye at bedtime.  07/04/13  Yes Historical Provider, MD  collagenase (SANTYL) ointment Apply topically daily. 01/08/14   Grace Bushy Minor, NP   BP 142/55  Pulse 87  Temp(Src) 98.1 F (36.7 C) (Oral)  Resp 18  Ht 5\' 10"  (1.778 m)  Wt 189 lb (85.73 kg)  BMI 27.12 kg/m2  SpO2 96% Physical Exam  Constitutional: He appears well-developed and well-nourished.  HENT:  Head: Normocephalic and atraumatic.  Eyes: Pupils are equal, round, and reactive to light.  Neck: Normal range of motion. Neck supple.  Cardiovascular: Normal rate, regular rhythm and normal heart sounds.   Pulmonary/Chest: Effort normal and breath sounds normal. No respiratory distress. He has no wheezes. He has no rales. He exhibits no tenderness.  Abdominal: Soft. Bowel sounds are normal. There is no tenderness. There is no rebound and no guarding.  Musculoskeletal: Normal range of motion. He exhibits no edema.  Lymphadenopathy:    He has no cervical adenopathy.  Neurological: He is alert. He has normal strength. No cranial nerve deficit or sensory deficit. GCS eye subscore is 4. GCS verbal subscore is 5. GCS motor subscore is 6.  Oriented to person. Disoriented to place or time. His granddaughter says this is baseline for him since he was recently in the hospital. He moves all extremities symmetrically with no focal deficits. He has no facial drooping or aphasia. No pronator drift. Finger to nose intact.   Skin: Skin is warm and dry. No rash noted.  Psychiatric: He has a normal mood and affect.    ED Course  Procedures (including critical care time) Labs Review Results for orders placed during the hospital encounter of 01/09/14  CBC WITH DIFFERENTIAL      Result Value Ref Range   WBC 4.1  4.0 - 10.5 K/uL   RBC 4.37  4.22 - 5.81 MIL/uL   Hemoglobin 14.1  13.0 - 17.0 g/dL   HCT 42.3  39.0 - 52.0 %   MCV 96.8  78.0 - 100.0 fL   MCH 32.3  26.0 - 34.0 pg   MCHC 33.3  30.0 - 36.0 g/dL   RDW 14.1  11.5 - 15.5 %   Platelets PLATELETS APPEAR ADEQUATE  150 - 400 K/uL   Neutrophils Relative % 53  43 - 77 %   Lymphocytes Relative 29  12 - 46 %   Monocytes Relative 10  3 - 12 %  Eosinophils Relative 7 (*) 0 - 5 %   Basophils Relative 1  0 - 1 %   Neutro Abs 2.2  1.7 - 7.7 K/uL   Lymphs Abs 1.2  0.7 - 4.0 K/uL   Monocytes Absolute 0.4  0.1 - 1.0 K/uL   Eosinophils Absolute 0.3  0.0 - 0.7 K/uL   Basophils Absolute 0.0  0.0 - 0.1 K/uL   Smear Review       Value: PLATELET CLUMPS NOTED ON SMEAR, COUNT APPEARS ADEQUATE  COMPREHENSIVE METABOLIC PANEL      Result Value Ref Range   Sodium 134 (*) 137 - 147 mEq/L   Potassium 3.5 (*) 3.7 - 5.3 mEq/L   Chloride 94 (*) 96 - 112 mEq/L   CO2 26  19 - 32 mEq/L   Glucose, Bld 128 (*) 70 - 99 mg/dL   BUN 28 (*) 6 - 23 mg/dL   Creatinine, Ser 1.35  0.50 - 1.35 mg/dL   Calcium 9.1  8.4 - 10.5 mg/dL   Total Protein 8.0  6.0 - 8.3 g/dL   Albumin 3.1 (*) 3.5 - 5.2 g/dL   AST 105 (*) 0 - 37 U/L   ALT 52  0 - 53 U/L   Alkaline Phosphatase 77  39 - 117 U/L   Total Bilirubin 1.8 (*) 0.3 - 1.2 mg/dL   GFR calc non Af Amer 48 (*) >90 mL/min   GFR calc Af Amer 55 (*) >90 mL/min  URINALYSIS, ROUTINE W REFLEX MICROSCOPIC      Result Value Ref Range   Color, Urine AMBER (*) YELLOW   APPearance CLEAR  CLEAR   Specific Gravity, Urine 1.016  1.005 - 1.030   pH 5.0  5.0 - 8.0   Glucose, UA NEGATIVE  NEGATIVE mg/dL   Hgb urine dipstick NEGATIVE  NEGATIVE    Bilirubin Urine NEGATIVE  NEGATIVE   Ketones, ur NEGATIVE  NEGATIVE mg/dL   Protein, ur NEGATIVE  NEGATIVE mg/dL   Urobilinogen, UA 1.0  0.0 - 1.0 mg/dL   Nitrite NEGATIVE  NEGATIVE   Leukocytes, UA SMALL (*) NEGATIVE  URINE MICROSCOPIC-ADD ON      Result Value Ref Range   Squamous Epithelial / LPF RARE  RARE   WBC, UA 3-6  <3 WBC/hpf   RBC / HPF 0-2  <3 RBC/hpf   Bacteria, UA RARE  RARE   Urine-Other RARE YEAST    I-STAT CG4 LACTIC ACID, ED      Result Value Ref Range   Lactic Acid, Venous 1.63  0.5 - 2.2 mmol/L  I-STAT TROPOININ, ED      Result Value Ref Range   Troponin i, poc 0.02  0.00 - 0.08 ng/mL   Comment 3           CBG MONITORING, ED      Result Value Ref Range   Glucose-Capillary 125 (*) 70 - 99 mg/dL   Comment 1 Notify RN     Comment 2 Documented in Chart     Ct Head Wo Contrast  01/09/2014   CLINICAL DATA:  Intermittent loss of consciousness/ syncope.  EXAM: CT HEAD WITHOUT CONTRAST  TECHNIQUE: Contiguous axial images were obtained from the base of the skull through the vertex without intravenous contrast.  COMPARISON:  None.  FINDINGS: Small infarct of the left frontal vertex which appears remote on images 24-28 of series 2.  Periventricular white matter and corona radiata hypodensities favor chronic ischemic microvascular white matter disease. Otherwise, The brainstem, cerebellum,  cerebral peduncles, thalamus, basal ganglia, basilar cisterns, and ventricular system appear within normal limits. No intracranial hemorrhage, mass lesion, or acute CVA.  Remote right medial orbital wall blowout fracture with herniation of orbital adipose tissue into the fracture site. Similar but less striking finding on the left. Chronic ethmoid sinusitis with mucosal swelling in the nasal cavity.  There is atherosclerotic calcification of the cavernous carotid arteries bilaterally.  IMPRESSION: 1. No acute intracranial findings are identified. 2. Remote appearing small left frontal vertex  infarct. 3. Periventricular white matter and corona radiata hypodensities favor chronic ischemic microvascular white matter disease. 4. Old medial orbital wall blowout fractures, larger on the right than the left. 5. Chronic ethmoid sinusitis.  Mucosal swelling in the nasal cavity.   Electronically Signed   By: Sherryl Barters M.D.   On: 01/09/2014 17:37      Imaging Review Ct Head Wo Contrast  01/09/2014   CLINICAL DATA:  Intermittent loss of consciousness/ syncope.  EXAM: CT HEAD WITHOUT CONTRAST  TECHNIQUE: Contiguous axial images were obtained from the base of the skull through the vertex without intravenous contrast.  COMPARISON:  None.  FINDINGS: Small infarct of the left frontal vertex which appears remote on images 24-28 of series 2.  Periventricular white matter and corona radiata hypodensities favor chronic ischemic microvascular white matter disease. Otherwise, The brainstem, cerebellum, cerebral peduncles, thalamus, basal ganglia, basilar cisterns, and ventricular system appear within normal limits. No intracranial hemorrhage, mass lesion, or acute CVA.  Remote right medial orbital wall blowout fracture with herniation of orbital adipose tissue into the fracture site. Similar but less striking finding on the left. Chronic ethmoid sinusitis with mucosal swelling in the nasal cavity.  There is atherosclerotic calcification of the cavernous carotid arteries bilaterally.  IMPRESSION: 1. No acute intracranial findings are identified. 2. Remote appearing small left frontal vertex infarct. 3. Periventricular white matter and corona radiata hypodensities favor chronic ischemic microvascular white matter disease. 4. Old medial orbital wall blowout fractures, larger on the right than the left. 5. Chronic ethmoid sinusitis.  Mucosal swelling in the nasal cavity.   Electronically Signed   By: Sherryl Barters M.D.   On: 01/09/2014 17:37   Dg Chest Portable 1 View  01/09/2014   CLINICAL DATA:  Altered  mental status  EXAM: PORTABLE CHEST - 1 VIEW  COMPARISON:  Portable chest x-ray of 01/05/2014  FINDINGS: There has been interval improvement in the edema pattern with slightly better aeration. There may still be small effusions present and there is residual pulmonary vascular congestion. Cardiomegaly is stable.  IMPRESSION: Some improvement in pulmonary edema with probable decrease in small effusions.   Electronically Signed   By: Ivar Drape M.D.   On: 01/09/2014 16:47     EKG Interpretation   Date/Time:  Thursday Jan 09 2014 16:07:14 EDT Ventricular Rate:  90 PR Interval:  163 QRS Duration: 79 QT Interval:  383 QTC Calculation: 469 R Axis:   -5 Text Interpretation:  Sinus rhythm Probable left atrial enlargement  Probable LVH with secondary repol abnrm since last tracing no significant  change Confirmed by Francesa Eugenio  MD, Arshad Oberholzer ([id number]) on 01/09/2014 4:09:51 PM      MDM   Final diagnoses:  Fall    I spoke to the staff at the nursing home. No one actually witnessed the patient fall. Apparently the patient was in his room and he's been trying to get out of bed on his own. They found him on the floor next to the  bed. It was unclear whether he syncopized and fell or he fell and had a short period where he was less than responsive. He had reported some chest pain and headache following a fall. He has no ischemic changes on EKG. His head CT is negative. He had a negative troponin. He's not had any complaints since he's been here in the ED. He's had no periods of seizure or unresponsiveness in the ED. He does have some confusion but no focal neurologic deficits. His granddaughter he was at the bedside says this is his normal mental status since his recent hospitalization. I don't find any other evidence of abnormalities that would've led to syncope or unresponsiveness. I feel this point he can be discharged back to the nursing home. I will check a second troponin and if this is normal we'll discharge  him back to the nursing home.    Malvin Johns, MD 01/09/14 2015

## 2014-01-29 ENCOUNTER — Ambulatory Visit (INDEPENDENT_AMBULATORY_CARE_PROVIDER_SITE_OTHER)
Admission: RE | Admit: 2014-01-29 | Discharge: 2014-01-29 | Disposition: A | Discharge status: Home / Self Care | Payer: Medicare Other | Source: Ambulatory Visit | Attending: Internal Medicine | Admitting: Internal Medicine

## 2014-01-29 ENCOUNTER — Ambulatory Visit (INDEPENDENT_AMBULATORY_CARE_PROVIDER_SITE_OTHER): Payer: Medicare Other | Admitting: Internal Medicine

## 2014-01-29 ENCOUNTER — Encounter: Payer: Self-pay | Admitting: Internal Medicine

## 2014-01-29 VITALS — BP 120/86 | HR 74 | Ht 70.0 in

## 2014-01-29 DIAGNOSIS — J9620 Acute and chronic respiratory failure, unspecified whether with hypoxia or hypercapnia: Secondary | ICD-10-CM | POA: Insufficient documentation

## 2014-01-29 NOTE — Patient Instructions (Signed)
Glad you are better Do 1 view cxr on way out Continue your medications as before  REturn in 6 months to see me or my NP

## 2014-01-29 NOTE — Progress Notes (Signed)
Subjective:    Patient ID: [name] [name], male    DOB: 05-31-1931, 80 y.o.   MRN: [id number]  HPI  Followup  ? interstitial lung disease not otherwise specified (restricted spir Jan 2015) with copd nos, cor pulmonale, chronic systolic and diastolic heart failure and chronic respiratory failure     OV 01/29/2014  This is a 80 year old male who is noncompliant and poor health literacy. I had seen him earlier in the for possible interstitial lung disease but could never get the workup done because of his repeated noncompliance. In the interim he was admitted 12/29/2013 through 01/08/2014 with acute respiratory failure and ventilator dependent. He had thoracentesis 12/30/13 that was non-diagnostic cytologyand likely transudate. This was due to  human metapneumovirus pneumonia associated with pleural effusions. On the family conference 01/02/2014 his CODE STATUS was elected to be DO NOT RESUSCITATE but he did survive the acute illness and is now in a nursing home. Not clear what his current code status is  He presents today for followup 01/29/2014. He says he is doing real well. It appears that his medications are being administered regularly. I can see that this makes a big difference for him and he looks better than he ever did before even before the admission. He seems to think that he is at home and his medications are being given by his niece.   Noted echo 12/30/13: LVEF 17% and diastolic dysfunction, PASP 59 with RV dilatation.   Review of Systems  Constitutional: Negative for fever and unexpected weight change.  HENT: Negative for congestion, dental problem, ear pain, nosebleeds, postnasal drip, rhinorrhea, sinus pressure, sneezing, sore throat and trouble swallowing.   Eyes: Negative for redness and itching.  Respiratory: Positive for wheezing. Negative for cough, chest tightness and shortness of breath.   Cardiovascular: Positive for leg swelling. Negative for palpitations.    Gastrointestinal: Negative for nausea and vomiting.  Genitourinary: Negative for dysuria.  Musculoskeletal: Negative for joint swelling.  Skin: Negative for rash.  Neurological: Negative for headaches.  Hematological: Does not bruise/bleed easily.  Psychiatric/Behavioral: Negative for dysphoric mood. The patient is not nervous/anxious.    Current outpatient prescriptions:albuterol (PROVENTIL HFA;VENTOLIN HFA) 108 (90 BASE) MCG/ACT inhaler, Inhale 2 puffs into the lungs every 4 (four) hours as needed for wheezing or shortness of breath. , Disp: , Rfl: ;  albuterol (PROVENTIL) (2.5 MG/3ML) 0.083% nebulizer solution, Take 2.5 mg by nebulization 3 (three) times daily. , Disp: , Rfl: ;  amLODipine (NORVASC) 10 MG tablet, Take 10 mg by mouth daily., Disp: , Rfl:  aspirin EC 81 MG tablet, Take 81 mg by mouth daily., Disp: , Rfl: ;  collagenase (SANTYL) ointment, Apply topically daily., Disp: 15 g, Rfl: 0;  fenofibrate 160 MG tablet, Take 160 mg by mouth daily., Disp: , Rfl: ;  lisinopril (ZESTRIL) 2.5 MG tablet, Take 1 tablet (2.5 mg total) by mouth daily., Disp: 30 tablet, Rfl: 1;  metoprolol succinate (TOPROL-XL) 25 MG 24 hr tablet, Take 25 mg by mouth daily., Disp: , Rfl:  torsemide (DEMADEX) 20 MG tablet, Take 20 mg by mouth 2 (two) times daily., Disp: , Rfl: ;  traMADol (ULTRAM) 50 MG tablet, Take 50 mg by mouth every 6 (six) hours as needed., Disp: , Rfl: ;  TRAVATAN Z 0.004 % SOLN ophthalmic solution, Place 1 drop into the left eye at bedtime. , Disp: , Rfl: ;  traZODone (DESYREL) 50 MG tablet, Take 50 mg by mouth at bedtime., Disp: , Rfl:  Objective:   Physical Exam  Nursing note and vitals reviewed. Constitutional: He is oriented to person, place, and time. No distress.  Deconditioned but better than before  HENT:  Head: Normocephalic and atraumatic.  Right Ear: External ear normal.  Left Ear: External ear normal.  Mouth/Throat: Oropharynx is clear and moist. No oropharyngeal exudate.   Eyes: Conjunctivae and EOM are normal. Pupils are equal, round, and reactive to light. Right eye exhibits no discharge. Left eye exhibits no discharge. No scleral icterus.  Neck: Normal range of motion. Neck supple. No JVD present. No tracheal deviation present. No thyromegaly present.  Cardiovascular: Normal rate, regular rhythm and intact distal pulses.  Exam reveals no gallop and no friction rub.   No murmur heard. Pulmonary/Chest: Effort normal and breath sounds normal. No respiratory distress. He has no wheezes. He has no rales. He exhibits no tenderness.  Do not hear rales  Abdominal: Soft. Bowel sounds are normal. He exhibits no distension and no mass. There is no tenderness. There is no rebound and no guarding.  Musculoskeletal: Normal range of motion. He exhibits no edema and no tenderness.  Both feet in bandage wrap Sitting in wheel chair  Lymphadenopathy:    He has no cervical adenopathy.  Neurological: He is alert and oriented to person, place, and time. No cranial nerve deficit. Coordination normal.  Skin: Skin is warm and dry. No rash noted. He is not diaphoretic. No erythema. No pallor.  Psychiatric:  Poor understanding          Assessment & Plan:  Glad you are better Do 1 view cxr on way out Continue your medications as before  REturn in 6 months to see me or my NP

## 2014-01-31 ENCOUNTER — Telehealth: Payer: Self-pay | Admitting: Internal Medicine

## 2014-01-31 NOTE — Telephone Encounter (Signed)
Per the 6.17.15 ov w/ MR: Patient Instructions     Glad you are better  Do 1 view cxr on way out  Continue your medications as before  REturn in 6 months to see me or my NP   Called [facility] and spoke with Stanton Kidney - she did not receive any physician orders from the 6.17.15 ov.  Discussed the above AVS with Stanton Kidney and she verbalized her understanding.  She does request pt's cxr and office visit be faxed to [phone] (read back to [facility] for verification) Sharee Pimple once MR has read/reviewed the cxr results.  No call back needed per [facility].  Will forward to both [place] and MR.  Thanks!

## 2014-02-02 NOTE — Telephone Encounter (Signed)
His CXR shows Rt pleural effusion still persists. This was a transudate when he was in ICU. Please have the SNF increase his torsemide to 20mg  twice daily (checking his bun, creat, and K , BP ) regularly with this. He should come back and see Tammy Parret in 1 month. If effusion still persists, he will need thoracentesis  Dr. Brand Males, M.D., [facility].C.P Pulmonary and Critical Care Medicine Staff Physician [facility] Pager: [phone], If no answer or between  15:00h - 7:00h: call [phone]  02/02/2014 2:30 AM

## 2014-02-02 NOTE — Assessment & Plan Note (Signed)
This has resolved. He had right sided pleural effusion at admission. Will reassess this with CXR today. Will see him back in 6 months with NP

## 2014-02-03 NOTE — Telephone Encounter (Signed)
I have faxed the last OV note and this phone note w/ cxr results over to [facility] at fax # provided.  ATC Mary but she was busy and was asked to cal back later. Wcb

## 2014-02-04 ENCOUNTER — Telehealth: Payer: Self-pay | Admitting: Internal Medicine

## 2014-02-04 DIAGNOSIS — I5023 Acute on chronic systolic (congestive) heart failure: Secondary | ICD-10-CM

## 2014-02-04 NOTE — Telephone Encounter (Signed)
Please refer to [facility] heart care or CHF clinic (CHF clinic preferred) for further advise. Please give patient appt. He has systolic CHF. THat demadex dose is high and so I do not want to mess with it  Ensure fu per prior OV plan

## 2014-02-04 NOTE — Telephone Encounter (Signed)
I called spoke w/ mary. According to fax MR wanted pt to increase demadex to 20 mg BID. Pt already takes the medication like this. Wants to know if MR wants to increase this or go ahead draw labs? Please advise thanks

## 2014-02-04 NOTE — Telephone Encounter (Signed)
Spoke with Stanton Kidney at at [facility] and advised of MR's recommendations.  She verbalized understanding.  Referral placed for CHF clinic

## 2014-02-04 NOTE — Telephone Encounter (Signed)
Spoke with Stanton Kidney and notified of the below recs per MR  She verbalized understanding  Appt for f/u with TP was scheduled for 03/03/14 at 11:15 am  Orders and appt info faxed to [facility] at [phone] and then placed in MR's scan folder

## 2014-02-25 ENCOUNTER — Encounter (HOSPITAL_COMMUNITY): Payer: Self-pay

## 2014-02-25 ENCOUNTER — Encounter (HOSPITAL_COMMUNITY): Payer: Self-pay | Admitting: *Deleted

## 2014-02-25 ENCOUNTER — Ambulatory Visit (HOSPITAL_COMMUNITY)
Admission: RE | Admit: 2014-02-25 | Discharge: 2014-02-25 | Disposition: A | Discharge status: Home / Self Care | Payer: Medicare Other | Source: Ambulatory Visit | Attending: Cardiology | Admitting: Cardiology

## 2014-02-25 VITALS — BP 130/76 | HR 83 | Wt 232.8 lb

## 2014-02-25 DIAGNOSIS — I509 Heart failure, unspecified: Secondary | ICD-10-CM | POA: Diagnosis not present

## 2014-02-25 DIAGNOSIS — Z87891 Personal history of nicotine dependence: Secondary | ICD-10-CM | POA: Insufficient documentation

## 2014-02-25 DIAGNOSIS — I129 Hypertensive chronic kidney disease with stage 1 through stage 4 chronic kidney disease, or unspecified chronic kidney disease: Secondary | ICD-10-CM | POA: Diagnosis not present

## 2014-02-25 DIAGNOSIS — I2789 Other specified pulmonary heart diseases: Secondary | ICD-10-CM | POA: Diagnosis not present

## 2014-02-25 DIAGNOSIS — Z993 Dependence on wheelchair: Secondary | ICD-10-CM | POA: Insufficient documentation

## 2014-02-25 DIAGNOSIS — L97509 Non-pressure chronic ulcer of other part of unspecified foot with unspecified severity: Secondary | ICD-10-CM | POA: Diagnosis not present

## 2014-02-25 DIAGNOSIS — J449 Chronic obstructive pulmonary disease, unspecified: Secondary | ICD-10-CM | POA: Diagnosis not present

## 2014-02-25 DIAGNOSIS — J8410 Pulmonary fibrosis, unspecified: Secondary | ICD-10-CM

## 2014-02-25 DIAGNOSIS — Z7982 Long term (current) use of aspirin: Secondary | ICD-10-CM | POA: Diagnosis not present

## 2014-02-25 DIAGNOSIS — I5032 Chronic diastolic (congestive) heart failure: Secondary | ICD-10-CM

## 2014-02-25 DIAGNOSIS — N183 Chronic kidney disease, stage 3 unspecified: Secondary | ICD-10-CM | POA: Diagnosis not present

## 2014-02-25 DIAGNOSIS — I739 Peripheral vascular disease, unspecified: Secondary | ICD-10-CM | POA: Diagnosis not present

## 2014-02-25 DIAGNOSIS — I5033 Acute on chronic diastolic (congestive) heart failure: Secondary | ICD-10-CM | POA: Diagnosis not present

## 2014-02-25 DIAGNOSIS — E119 Type 2 diabetes mellitus without complications: Secondary | ICD-10-CM | POA: Diagnosis not present

## 2014-02-25 DIAGNOSIS — J4489 Other specified chronic obstructive pulmonary disease: Secondary | ICD-10-CM | POA: Insufficient documentation

## 2014-02-25 LAB — BASIC METABOLIC PANEL
ANION GAP: 17 — AB (ref 5–15)
BUN: 29 mg/dL — ABNORMAL HIGH (ref 6–23)
CALCIUM: 9.1 mg/dL (ref 8.4–10.5)
CHLORIDE: 96 meq/L (ref 96–112)
CO2: 22 mEq/L (ref 19–32)
Creatinine, Ser: 1.25 mg/dL (ref 0.50–1.35)
GFR calc Af Amer: 61 mL/min — ABNORMAL LOW (ref >90)
GFR calc non Af Amer: 52 mL/min — ABNORMAL LOW (ref >90)
GLUCOSE: 130 mg/dL — AB (ref 70–99)
Potassium: 4.1 mEq/L (ref 3.7–5.3)
SODIUM: 135 meq/L — AB (ref 137–147)

## 2014-02-25 LAB — PRO B NATRIURETIC PEPTIDE: PRO B NATRI PEPTIDE: 11460 pg/mL — AB (ref 0–450)

## 2014-02-25 MED ORDER — TORSEMIDE 20 MG PO TABS: 40.0000 mg | ORAL_TABLET | Freq: Two times a day (BID) | ORAL | Status: DC

## 2014-02-25 MED ORDER — POTASSIUM CHLORIDE ER 10 MEQ PO TBCR: 10.0000 meq | EXTENDED_RELEASE_TABLET | Freq: Every day | ORAL | Status: DC

## 2014-02-25 MED ORDER — ATORVASTATIN CALCIUM 20 MG PO TABS: 20.0000 mg | ORAL_TABLET | Freq: Every day | ORAL | Status: DC

## 2014-02-25 NOTE — Patient Instructions (Signed)
Increase Torsemide to 40 mg (2 tabs) Twice daily   Start Potassium 10 meq daily  Start Atorvastatin 20 mg daily  Heart Cath on Thursday 7/23, see instruction sheet  Labs today and in 10 days  Your physician has requested that you have a lexiscan myoview. For further information please visit [email or web address]. Please follow instruction sheet, as given.  Your physician recommends that you schedule a follow-up appointment in: 2 weeks

## 2014-02-26 ENCOUNTER — Other Ambulatory Visit: Payer: Self-pay | Admitting: *Deleted

## 2014-02-26 DIAGNOSIS — I5032 Chronic diastolic (congestive) heart failure: Secondary | ICD-10-CM | POA: Insufficient documentation

## 2014-02-26 NOTE — Progress Notes (Signed)
Patient ID: [name] [name], male   DOB: [birth year], 80 y.o.   MRN: [id number] PCP: Dr. Sarita Haver Referring MD: [name]  80 yo with history of possible interstitial lung disease, metapneumovirus PNA in 5/15 with intubation, CKD, ?dementia, and CHF (primarily diastolic) presents for cardiology evaluation.  He was referred by [name] who has been following him for interstitial lung disease.  PFTs in 1/15 showed restrictive spirometry/lung volumes.  Full ILD workup has not been completed.  Most recent echo in 5/15 showed mild global hypokinesis, EF 45% with mildly dilated and dysfunctional RV and moderate pulmonary hypertension.  Patient has chronic right foot ulcers with mildly depressed ABIs.  He had an admission for CHF in 3/15, and as above, he had an admission with respiratory failure and metapneumovirus PNA in 5/15.    Patient lives in a nursing home currently.  He is very inactive, using a wheelchair to get around.  Much of history comes from his caregiver.  She is concerned that he has a component of dementia though apparently this has not been formally diagnosed.  He is short of breath with any exertion and has been this way for some time.  He has been wheezing and coughing.  He is very weak.  He is orthopneic and sleeps on his side or on several pillows.  He has non-exertional chest pain that is relatively mild several times a week.  No syncope, lightheadedness.  No tachypalpitations.    ECG (5/15): NSR, LVH with repolarization abnormalities  Labs (5/15): K 3.5, creatinine 1.35, HCT 42.3  PMH: 1. Possible interstitial lung disease: Followed by [name].  PFTs (1/15) with FVC 53%, FEV1 46%, ratio 85%, TLC 62%, DLCO 45% (restrictive defect).  2. Metapneumovirus PNA in 5/15 with acute respiratory failure and intubation.  Bilateral transudative pleural effusions.  3. CKD: Followed by Dr. [name] 4. Type II diabetes: Currently diet-controlled.  5. ?Dementia: Caregiver  concerned this is present, apparently no formal diagnosis.  6. HTN 7. Primarily diastolic CHF: Echo (7/06) with EF 45%, mild global hypokinesis, mild MR, mild to moderate LAE, mild MR, RV mildly dilated with mildly decreased systolic function, PA systolic pressure 59 mmHg.  8. PAD: 3/15 ABIs 0.7 on right, 0.8 on left.  Nonhealing ulcers on right foot.   SH: Lives a [facility] currently, has son, comes in with caregiver.  Prior smoker.  No ETOH.   FH: "heart disease"  ROS: All systems reviewed and negative except as per HPI.   Current Outpatient Prescriptions  Medication Sig Dispense Refill  . albuterol (PROVENTIL HFA;VENTOLIN HFA) 108 (90 BASE) MCG/ACT inhaler Inhale 2 puffs into the lungs every 4 (four) hours as needed for wheezing or shortness of breath.       Marland Kitchen albuterol (PROVENTIL) (2.5 MG/3ML) 0.083% nebulizer solution Take 2.5 mg by nebulization 3 (three) times daily.       Marland Kitchen amLODipine (NORVASC) 10 MG tablet Take 10 mg by mouth daily.      Marland Kitchen aspirin EC 81 MG tablet Take 81 mg by mouth daily.      . collagenase (SANTYL) ointment Apply topically daily.  15 g  0  . fenofibrate 160 MG tablet Take 160 mg by mouth daily.      Marland Kitchen lisinopril (ZESTRIL) 2.5 MG tablet Take 1 tablet (2.5 mg total) by mouth daily.  30 tablet  1  . metoprolol succinate (TOPROL-XL) 25 MG 24 hr tablet Take 25 mg by mouth daily.      Marland Kitchen torsemide (  DEMADEX) 20 MG tablet Take 2 tablets (40 mg total) by mouth 2 (two) times daily.      . traMADol (ULTRAM) 50 MG tablet Take 50 mg by mouth every 6 (six) hours as needed.      . TRAVATAN Z 0.004 % SOLN ophthalmic solution Place 1 drop into the left eye at bedtime.       . traZODone (DESYREL) 50 MG tablet Take 50 mg by mouth at bedtime.      Marland Kitchen atorvastatin (LIPITOR) 20 MG tablet Take 1 tablet (20 mg total) by mouth daily.  90 tablet  3  . potassium chloride (K-DUR) 10 MEQ tablet Take 1 tablet (10 mEq total) by mouth daily.  90 tablet  3   No current facility-administered  medications for this encounter.   BP 130/76  Pulse 83  Wt 232 lb 12.8 oz (105.597 kg)  SpO2 97% General: NAD, chronically ill-appearing in wheelchair Neck: JVP 14-16 cm, no thyromegaly or thyroid nodule.  Lungs: Crackles at bases bilaterally.  CV: Nondisplaced PMI.  Heart regular S1/S2, no U3/J4, 2/6 systolic murmur apex/LLSB.  2+ edema to thighs.  No carotid bruit.  Unable to palpate pedal pulses.   Abdomen: Soft, nontender, no hepatosplenomegaly, no distention.  Skin: wrapped ulcerations on the right ankle and foot  Neurologic: Alert and oriented x 3.  Psych: Normal affect. Extremities: No clubbing or cyanosis.  HEENT: Normal.   Assessment/Plan: 1. Chronic primarily diastolic CHF: EF 78% with diffuse hypokinesis on last echo.  Moderate pulmonary hypertension, suspect pulmonary venous hypertension in the setting of elevated left atrial pressure.  He is massively volume overloaded with NYHA class IV symptoms.  This has been stable for weeks.   - Increase torsemide to 40 mg bid and add KCl 10 daily.  - BMET/BNP today and in 10 days.  - I will arrange for right heart catheterization next week.  - Given mildly decreased EF and CHF, will arrange for Lexiscan Cardiolite to assess for ischemia.  Will avoid left heart catheterization for now given CKD and my suspicion that CAD does not play a major role here.  - If unable to diurese effectively as an outpatient, he may require admission for IV diuresis.  2. Interstitial lung disease: Restrictive PFTs.  He has not completed a full workup of this yet.  Per [name].   3. CKD: Follow creatinine closely with diuresis.  4. PAD: ABIs only mildly decreased though he has right foot ulcerations.  Continue ASA 81.  He should be on a statin, I will start atorvastatin 20 mg daily with lipids/LFTs in 2 months.    Followup in 2 wks.   Loralie Champagne 02/26/2014

## 2014-02-28 ENCOUNTER — Telehealth (HOSPITAL_COMMUNITY): Payer: Self-pay | Admitting: Cardiology

## 2014-02-28 NOTE — Telephone Encounter (Signed)
Pt scheduled for RHC on 03/06/2014 Cpt code 93453 icd 9- 428.32 With pts current  [employer]  Pre cert #[id number]

## 2014-03-03 ENCOUNTER — Other Ambulatory Visit: Payer: Self-pay | Admitting: Adult Health

## 2014-03-03 ENCOUNTER — Ambulatory Visit (INDEPENDENT_AMBULATORY_CARE_PROVIDER_SITE_OTHER): Payer: PRIVATE HEALTH INSURANCE | Admitting: Adult Health

## 2014-03-03 ENCOUNTER — Ambulatory Visit (INDEPENDENT_AMBULATORY_CARE_PROVIDER_SITE_OTHER)
Admission: RE | Admit: 2014-03-03 | Discharge: 2014-03-03 | Disposition: A | Discharge status: Home / Self Care | Payer: PRIVATE HEALTH INSURANCE | Source: Ambulatory Visit | Attending: Adult Health | Admitting: Adult Health

## 2014-03-03 ENCOUNTER — Encounter: Payer: Self-pay | Admitting: Adult Health

## 2014-03-03 VITALS — BP 122/62 | HR 74 | Temp 98.4°F | Ht 69.0 in

## 2014-03-03 DIAGNOSIS — I5023 Acute on chronic systolic (congestive) heart failure: Secondary | ICD-10-CM

## 2014-03-03 DIAGNOSIS — I509 Heart failure, unspecified: Secondary | ICD-10-CM

## 2014-03-03 DIAGNOSIS — R0989 Other specified symptoms and signs involving the circulatory and respiratory systems: Secondary | ICD-10-CM

## 2014-03-03 DIAGNOSIS — R0600 Dyspnea, unspecified: Secondary | ICD-10-CM

## 2014-03-03 DIAGNOSIS — J90 Pleural effusion, not elsewhere classified: Secondary | ICD-10-CM

## 2014-03-03 DIAGNOSIS — R0609 Other forms of dyspnea: Secondary | ICD-10-CM

## 2014-03-03 NOTE — Patient Instructions (Signed)
Chest xray today  I will call with results.  Continue on fluid pills as directed.  Follow with cardiology as planned  Follow up [name] in 6 weeks and As needed

## 2014-03-03 NOTE — Progress Notes (Signed)
Subjective:    Patient ID: [name] [name], male    DOB: April 04, 1931, 80 y.o.   MRN: [id number]  HPI OV 07/25/2013 PCP [name], MD REferred by [name] HPI   IOV 07/25/2013  Chief Complaint  Patient presents with  . Pulmonary Consult    for SOB with activity. States PCP started him on oxygen to use with exertion and at bedtime.    80 year old [place] American male referred for dyspnea. He is a poor historian as is his caretaker is. I cannot get much information out of him. The best I can gather is that he said insidious onset of dyspnea for the last 2 months. Worsened with exertion for minimal activities. Relieved by rest. Dyspnea is rated as moderate. It got worse with admission to the hospital in October 2014 for heart failure but since then has improved but is still persistent and stable. There is associated wheeze but no associated chest pain or edema. Though he maintained that he gets dyspneic for minimal activities and apparently yesterday he desaturated in the primary care physician's office. However, today we walked him 108 5 feet x2 laps on room air. He stopped due to claudication of his lower extremities and not due to dyspnea. He did not desaturate.  Relevant investigations are as below   Pulmonary Relevant Hx  Smoking:  reports that he quit smoking about 4 years ago. His smoking use included Cigarettes. He has a 35 pack-year smoking history. He has never used smokeless tobacco.  CT cest Jan 2013 reviewed image: 3mm LUL inferior lobe nodule. Pleural thickening at base. Not hadd FU CT  WEight Body mass index is 32.94 kg/(m^2).  CKD STage 3, creat 1.36 Nov 2014  October 2014 due to decompensated congestive heart failure not otherwise specified with associated acute respiratory failure. Also not otherwise specified. Followed with Dr. [name] 07/09/2013 and found to have been improved  Relevant cardiac investigations include echocardiogram 03/25/2013 with normal  LV systolic ejection fraction 55%. Severe pulmonary hypertension. PA systolic pressure 61.  BNP at this time 12K  Myoview stress test 05/10/2013: Stress test was terminated due to completion of protocol. Left ventricle was mild to moderately dilated at both rest and stress. There was diaphragmatic attenuation but statistically no significant change you. Left ventricle ejection fraction was 40%. Low risk study it is the conclusion   CXR Nov 2014: CHF  V  ILD (pro-BNP 6K at this time)  08/30/2013  ov/Wert re: preop consultation/ clearance for eye sutery  Chief Complaint  Patient presents with  . Follow-up    [name] [name] patient- Needs pulm clearance for eye surgery. Pt states that he feels his breathing is doing well and denies any CP, wheezing or cough.     On ACEi but no cough, Rarely using neb, not using 02 any more at all , sleeping flat ok Not using nebs or saba at all  [place] 01/29/2014 This is a 79 year old male who is noncompliant and poor health literacy. I had seen him earlier in the for possible interstitial lung disease but could never get the workup done because of his repeated noncompliance. In the interim he was admitted 12/29/2013 through 01/08/2014 with acute respiratory failure and ventilator dependent. He had thoracentesis 12/30/13 that was non-diagnostic cytologyand likely transudate. This was due to  human metapneumovirus pneumonia associated with pleural effusions. On the family conference 01/02/2014 his CODE STATUS was elected to be DO NOT RESUSCITATE but he did survive the acute illness and is now  in a nursing home. Not clear what his current code status is  He presents today for followup 01/29/2014. He says he is doing real well. It appears that his medications are being administered regularly. I can see that this makes a big difference for him and he looks better than he ever did before even before the admission. He seems to think that he is at home and his medications are being  given by his niece.   Noted echo 12/30/13: LVEF 89% and diastolic dysfunction, PASP 59 with RV dilatation.    03/03/2014 Follow up  Returns for follow up. Had Metapneumovirus PNA w/ pleural effusion w/ intubation during admission in 12/2013.  Following with cardiology for  Combined CHF . Plans to undergo right heart cath this week to evaluate his pulmonary HTN., . Lasix was increased last week d/t volume overload  Says over he feels breathing /DOE has improved  Cough has resolved.  Patient denies any hemoptysis, orthopnea, PND , or fever     PMH:  1. Possible interstitial lung disease: Followed by [name]. PFTs (1/15) with FVC 53%, FEV1 46%, ratio 85%, TLC 62%, DLCO 45% (restrictive defect).  2. Metapneumovirus PNA in 5/15 with acute respiratory failure and intubation. Bilateral transudative pleural effusions.  3. CKD: Followed by Dr. [name]  4. Type II diabetes: Currently diet-controlled.  5. ?Dementia: Caregiver concerned this is present, apparently no formal diagnosis.  6. HTN  7. Primarily diastolic CHF: Echo (3/81) with EF 45%, mild global hypokinesis, mild MR, mild to moderate LAE, mild MR, RV mildly dilated with mildly decreased systolic function, PA systolic pressure 59 mmHg.  8. PAD: 3/15 ABIs 0.7 on right, 0.8 on left. Nonhealing ulcers on right foot.  SH: Lives a [facility] currently, has son, comes in with caregiver. Prior smoker. No ETOH.  FH: "heart disease"    Review of Systems  Constitutional:   No  weight loss, night sweats,  Fevers, chills,  +fatigue, or  lassitude.  HEENT:   No headaches,  Difficulty swallowing,  Tooth/dental problems, or  Sore throat,                No sneezing, itching, ear ache, nasal congestion, post nasal drip,   CV:  No chest pain,  Orthopnea, PND,  +swelling in lower extremities,  No anasarca, dizziness, palpitations, syncope.   GI  No heartburn, indigestion, abdominal pain, nausea, vomiting, diarrhea, change in bowel habits,  loss of appetite, bloody stools.   Resp:    No chest wall deformity  Skin: no rash or lesions.  GU: no dysuria, change in color of urine, no urgency or frequency.  No flank pain, no hematuria   MS:  No joint pain or swelling.  No decreased range of motion.  No back pain.  Psych:  No change in mood or affect. No depression or anxiety.  No memory loss.         Objective:   Physical Exam  GEN: A/Ox3; pleasant , NAD, elderly   HEENT:  [place]/AT,  EACs-clear, TMs-wnl, NOSE-clear, THROAT-clear, no lesions, no postnasal drip or exudate noted.   NECK:  Supple w/ fair ROM; no JVD; normal carotid impulses w/o bruits; no thyromegaly or nodules palpated; no lymphadenopathy.  RESP  Diminished BS in bases  w/o, wheezes/ rales/ or rhonchi.no accessory muscle use, no dullness to percussion  CARD:  RRR, no m/r/g  , 1+ peripheral edema, pulses intact, no cyanosis or clubbing.  GI:   Soft & nt; nml bowel  sounds; no organomegaly or masses detected.  Musco: Warm bil, no deformities or joint swelling noted.   Neuro: alert, no focal deficits noted.    Skin: Warm, no lesions or rashes

## 2014-03-04 NOTE — Assessment & Plan Note (Signed)
Recheck cxr today  May need thoracentesis in future if diuresis not helping.

## 2014-03-04 NOTE — Assessment & Plan Note (Signed)
Cont cards follow up   Plan   Continue on fluid pills as directed.  Follow with cardiology as planned  Follow up [name] in 6 weeks and As needed

## 2014-03-06 ENCOUNTER — Encounter (HOSPITAL_COMMUNITY)
Admission: RE | Disposition: A | Discharge status: Skilled Nursing Facility | Payer: Self-pay | Source: Ambulatory Visit | Attending: Cardiology

## 2014-03-06 ENCOUNTER — Encounter (HOSPITAL_COMMUNITY): Payer: Self-pay | Admitting: Pharmacy Technician

## 2014-03-06 ENCOUNTER — Telehealth: Payer: Self-pay | Admitting: Adult Health

## 2014-03-06 ENCOUNTER — Inpatient Hospital Stay (HOSPITAL_COMMUNITY)
Admission: RE | Admit: 2014-03-06 | Discharge: 2014-03-12 | DRG: 287 | Disposition: A | Discharge status: Skilled Nursing Facility | Payer: PRIVATE HEALTH INSURANCE | Source: Ambulatory Visit | Attending: Cardiology | Admitting: Cardiology

## 2014-03-06 DIAGNOSIS — F0390 Unspecified dementia without behavioral disturbance: Secondary | ICD-10-CM | POA: Diagnosis present

## 2014-03-06 DIAGNOSIS — I5033 Acute on chronic diastolic (congestive) heart failure: Secondary | ICD-10-CM | POA: Diagnosis present

## 2014-03-06 DIAGNOSIS — Z79899 Other long term (current) drug therapy: Secondary | ICD-10-CM | POA: Diagnosis not present

## 2014-03-06 DIAGNOSIS — I2789 Other specified pulmonary heart diseases: Secondary | ICD-10-CM | POA: Diagnosis present

## 2014-03-06 DIAGNOSIS — I739 Peripheral vascular disease, unspecified: Secondary | ICD-10-CM | POA: Diagnosis present

## 2014-03-06 DIAGNOSIS — N183 Chronic kidney disease, stage 3 unspecified: Secondary | ICD-10-CM | POA: Diagnosis present

## 2014-03-06 DIAGNOSIS — Z87891 Personal history of nicotine dependence: Secondary | ICD-10-CM

## 2014-03-06 DIAGNOSIS — I509 Heart failure, unspecified: Secondary | ICD-10-CM | POA: Diagnosis present

## 2014-03-06 DIAGNOSIS — J449 Chronic obstructive pulmonary disease, unspecified: Secondary | ICD-10-CM | POA: Diagnosis present

## 2014-03-06 DIAGNOSIS — R0602 Shortness of breath: Secondary | ICD-10-CM | POA: Diagnosis present

## 2014-03-06 DIAGNOSIS — I129 Hypertensive chronic kidney disease with stage 1 through stage 4 chronic kidney disease, or unspecified chronic kidney disease: Secondary | ICD-10-CM | POA: Diagnosis present

## 2014-03-06 DIAGNOSIS — I5023 Acute on chronic systolic (congestive) heart failure: Secondary | ICD-10-CM

## 2014-03-06 DIAGNOSIS — I5043 Acute on chronic combined systolic (congestive) and diastolic (congestive) heart failure: Secondary | ICD-10-CM

## 2014-03-06 DIAGNOSIS — I351 Nonrheumatic aortic (valve) insufficiency: Secondary | ICD-10-CM | POA: Diagnosis present

## 2014-03-06 DIAGNOSIS — E871 Hypo-osmolality and hyponatremia: Secondary | ICD-10-CM | POA: Diagnosis present

## 2014-03-06 DIAGNOSIS — L97519 Non-pressure chronic ulcer of other part of right foot with unspecified severity: Secondary | ICD-10-CM

## 2014-03-06 DIAGNOSIS — E119 Type 2 diabetes mellitus without complications: Secondary | ICD-10-CM

## 2014-03-06 DIAGNOSIS — E785 Hyperlipidemia, unspecified: Secondary | ICD-10-CM | POA: Diagnosis present

## 2014-03-06 DIAGNOSIS — I340 Nonrheumatic mitral (valve) insufficiency: Secondary | ICD-10-CM | POA: Diagnosis present

## 2014-03-06 DIAGNOSIS — J8410 Pulmonary fibrosis, unspecified: Secondary | ICD-10-CM | POA: Diagnosis present

## 2014-03-06 DIAGNOSIS — Z7982 Long term (current) use of aspirin: Secondary | ICD-10-CM

## 2014-03-06 DIAGNOSIS — M129 Arthropathy, unspecified: Secondary | ICD-10-CM | POA: Diagnosis present

## 2014-03-06 DIAGNOSIS — L97509 Non-pressure chronic ulcer of other part of unspecified foot with unspecified severity: Secondary | ICD-10-CM | POA: Diagnosis present

## 2014-03-06 DIAGNOSIS — I279 Pulmonary heart disease, unspecified: Secondary | ICD-10-CM

## 2014-03-06 DIAGNOSIS — I10 Essential (primary) hypertension: Secondary | ICD-10-CM | POA: Diagnosis present

## 2014-03-06 DIAGNOSIS — J4489 Other specified chronic obstructive pulmonary disease: Secondary | ICD-10-CM | POA: Diagnosis present

## 2014-03-06 HISTORY — DX: Peripheral vascular disease, unspecified: I73.9

## 2014-03-06 HISTORY — DX: Chronic kidney disease, stage 3 (moderate): N18.3

## 2014-03-06 HISTORY — DX: Nonrheumatic mitral (valve) insufficiency: I34.0

## 2014-03-06 HISTORY — DX: Unspecified dementia, unspecified severity, without behavioral disturbance, psychotic disturbance, mood disturbance, and anxiety: F03.90

## 2014-03-06 HISTORY — DX: Other disorders of lung: J98.4

## 2014-03-06 HISTORY — DX: Human metapneumovirus pneumonia: J12.3

## 2014-03-06 HISTORY — DX: ST elevation (STEMI) myocardial infarction involving other coronary artery of inferior wall: I21.19

## 2014-03-06 HISTORY — DX: Nonrheumatic aortic (valve) insufficiency: I35.1

## 2014-03-06 HISTORY — DX: Chronic kidney disease, stage 3 unspecified: N18.30

## 2014-03-06 HISTORY — PX: RIGHT HEART CATHETERIZATION: SHX5447

## 2014-03-06 HISTORY — DX: Chronic diastolic (congestive) heart failure: I50.32

## 2014-03-06 LAB — POCT I-STAT 3, ART BLOOD GAS (G3+)
Acid-Base Excess: 7 mmol/L — ABNORMAL HIGH (ref 0.0–2.0)
Bicarbonate: 29.3 mEq/L — ABNORMAL HIGH (ref 20.0–24.0)
O2 SAT: 91 %
PO2 ART: 53 mmHg — AB (ref 80.0–100.0)
TCO2: 30 mmol/L (ref 0–100)
pCO2 arterial: 34.3 mmHg — ABNORMAL LOW (ref 35.0–45.0)
pH, Arterial: 7.539 — ABNORMAL HIGH (ref 7.350–7.450)

## 2014-03-06 LAB — CBC
HCT: 33.8 % — ABNORMAL LOW (ref 39.0–52.0)
HEMATOCRIT: 37.1 % — AB (ref 39.0–52.0)
HEMOGLOBIN: 12.4 g/dL — AB (ref 13.0–17.0)
Hemoglobin: 11.6 g/dL — ABNORMAL LOW (ref 13.0–17.0)
MCH: 32.4 pg (ref 26.0–34.0)
MCH: 33 pg (ref 26.0–34.0)
MCHC: 33.4 g/dL (ref 30.0–36.0)
MCHC: 34.3 g/dL (ref 30.0–36.0)
MCV: 96.9 fL (ref 78.0–100.0)
MCV: 96 fL (ref 78.0–100.0)
Platelets: 304 10*3/uL (ref 150–400)
Platelets: 317 10*3/uL (ref 150–400)
RBC: 3.83 MIL/uL — ABNORMAL LOW (ref 4.22–5.81)
RBC: 3.52 MIL/uL — ABNORMAL LOW (ref 4.22–5.81)
RDW: 14.4 % (ref 11.5–15.5)
RDW: 14.2 % (ref 11.5–15.5)
WBC: 6.8 10*3/uL (ref 4.0–10.5)
WBC: 6.5 10*3/uL (ref 4.0–10.5)

## 2014-03-06 LAB — MRSA PCR SCREENING: MRSA BY PCR: NEGATIVE

## 2014-03-06 LAB — BASIC METABOLIC PANEL
Anion gap: 13 (ref 5–15)
BUN: 33 mg/dL — AB (ref 6–23)
CHLORIDE: 94 meq/L — AB (ref 96–112)
CO2: 29 meq/L (ref 19–32)
CREATININE: 1.39 mg/dL — AB (ref 0.50–1.35)
Calcium: 8.8 mg/dL (ref 8.4–10.5)
GFR calc Af Amer: 53 mL/min — ABNORMAL LOW (ref >90)
GFR calc non Af Amer: 46 mL/min — ABNORMAL LOW (ref >90)
GLUCOSE: 99 mg/dL (ref 70–99)
Potassium: 3.9 mEq/L (ref 3.7–5.3)
Sodium: 136 mEq/L — ABNORMAL LOW (ref 137–147)

## 2014-03-06 LAB — PROTIME-INR
INR: 1.32 (ref 0.00–1.49)
Prothrombin Time: 16.4 seconds — ABNORMAL HIGH (ref 11.6–15.2)

## 2014-03-06 LAB — POCT I-STAT 3, VENOUS BLOOD GAS (G3P V)
ACID-BASE EXCESS: 5 mmol/L — AB (ref 0.0–2.0)
BICARBONATE: 29.3 meq/L — AB (ref 20.0–24.0)
O2 SAT: 45 %
TCO2: 31 mmol/L (ref 0–100)
pCO2, Ven: 42.9 mmHg — ABNORMAL LOW (ref 45.0–50.0)
pH, Ven: 7.443 — ABNORMAL HIGH (ref 7.250–7.300)
pO2, Ven: 24 mmHg — CL (ref 30.0–45.0)

## 2014-03-06 LAB — GLUCOSE, CAPILLARY
Glucose-Capillary: 91 mg/dL (ref 70–99)
Glucose-Capillary: 93 mg/dL (ref 70–99)
Glucose-Capillary: 83 mg/dL (ref 70–99)

## 2014-03-06 LAB — CREATININE, SERUM
CREATININE: 1.31 mg/dL (ref 0.50–1.35)
GFR calc Af Amer: 57 mL/min — ABNORMAL LOW (ref >90)
GFR calc non Af Amer: 49 mL/min — ABNORMAL LOW (ref >90)

## 2014-03-06 LAB — TSH: TSH: 3.12 u[IU]/mL (ref 0.350–4.500)

## 2014-03-06 LAB — PRO B NATRIURETIC PEPTIDE: Pro B Natriuretic peptide (BNP): 6799 pg/mL — ABNORMAL HIGH (ref 0–450)

## 2014-03-06 SURGERY — RIGHT HEART CATH
Anesthesia: LOCAL

## 2014-03-06 MED ORDER — FUROSEMIDE 10 MG/ML IJ SOLN
10.0000 mg/h | INTRAMUSCULAR | Status: DC
  Administered 2014-03-06 – 2014-03-10 (×5): 10 mg/h via INTRAVENOUS
  Filled 2014-03-06 (×12): qty 25

## 2014-03-06 MED ORDER — FUROSEMIDE 10 MG/ML IJ SOLN
40.0000 mg | Freq: Once | INTRAMUSCULAR | Status: AC
  Administered 2014-03-06: 40 mg via INTRAVENOUS
  Filled 2014-03-06: qty 4

## 2014-03-06 MED ORDER — LATANOPROST 0.005 % OP SOLN
1.0000 [drp] | Freq: Every day | OPHTHALMIC | Status: DC
  Administered 2014-03-06 – 2014-03-11 (×6): 1 [drp] via OPHTHALMIC
  Filled 2014-03-06 (×2): qty 2.5

## 2014-03-06 MED ORDER — SODIUM CHLORIDE 0.9 % IV SOLN: INTRAVENOUS | Status: DC

## 2014-03-06 MED ORDER — POTASSIUM CHLORIDE CRYS ER 20 MEQ PO TBCR
40.0000 meq | EXTENDED_RELEASE_TABLET | Freq: Once | ORAL | Status: AC
  Administered 2014-03-06: 40 meq via ORAL
  Filled 2014-03-06: qty 2

## 2014-03-06 MED ORDER — LIDOCAINE HCL (PF) 1 % IJ SOLN
INTRAMUSCULAR | Status: AC
  Filled 2014-03-06: qty 30

## 2014-03-06 MED ORDER — SODIUM CHLORIDE 0.9 % IV SOLN: 250.0000 mL | INTRAVENOUS | Status: DC | PRN

## 2014-03-06 MED ORDER — ASPIRIN 81 MG PO CHEW
81.0000 mg | CHEWABLE_TABLET | Freq: Every day | ORAL | Status: DC
Start: 2014-03-06 — End: 2014-03-12
  Administered 2014-03-06 – 2014-03-12 (×7): 81 mg via ORAL
  Filled 2014-03-06 (×7): qty 1

## 2014-03-06 MED ORDER — LISINOPRIL 2.5 MG PO TABS
2.5000 mg | ORAL_TABLET | Freq: Every day | ORAL | Status: DC
  Administered 2014-03-06: 2.5 mg via ORAL
  Filled 2014-03-06 (×2): qty 1

## 2014-03-06 MED ORDER — SODIUM CHLORIDE 0.9 % IJ SOLN
3.0000 mL | Freq: Two times a day (BID) | INTRAMUSCULAR | Status: DC
  Administered 2014-03-07 – 2014-03-10 (×5): 3 mL via INTRAVENOUS
  Administered 2014-03-11: 09:00:00 via INTRAVENOUS
  Administered 2014-03-12: 3 mL via INTRAVENOUS

## 2014-03-06 MED ORDER — ACETAMINOPHEN 325 MG PO TABS: 650.0000 mg | ORAL_TABLET | ORAL | Status: DC | PRN

## 2014-03-06 MED ORDER — ONDANSETRON HCL 4 MG/2ML IJ SOLN
4.0000 mg | Freq: Four times a day (QID) | INTRAMUSCULAR | Status: DC | PRN
  Filled 2014-03-06: qty 2

## 2014-03-06 MED ORDER — SODIUM CHLORIDE 0.9 % IJ SOLN: 10.0000 mL | INTRAMUSCULAR | Status: DC | PRN

## 2014-03-06 MED ORDER — SODIUM CHLORIDE 0.9 % IJ SOLN: 3.0000 mL | INTRAMUSCULAR | Status: DC | PRN

## 2014-03-06 MED ORDER — MILRINONE IN DEXTROSE 20 MG/100ML IV SOLN
0.2500 ug/kg/min | INTRAVENOUS | Status: DC
  Administered 2014-03-06 – 2014-03-07 (×2): 0.25 ug/kg/min via INTRAVENOUS
  Filled 2014-03-06 (×2): qty 100

## 2014-03-06 MED ORDER — HEPARIN SODIUM (PORCINE) 5000 UNIT/ML IJ SOLN
5000.0000 [IU] | Freq: Three times a day (TID) | INTRAMUSCULAR | Status: DC
  Administered 2014-03-06 – 2014-03-12 (×19): 5000 [IU] via SUBCUTANEOUS
  Filled 2014-03-06 (×22): qty 1

## 2014-03-06 MED ORDER — SODIUM CHLORIDE 0.9 % IJ SOLN: 3.0000 mL | Freq: Two times a day (BID) | INTRAMUSCULAR | Status: DC

## 2014-03-06 MED ORDER — SODIUM CHLORIDE 0.9 % IJ SOLN
10.0000 mL | Freq: Two times a day (BID) | INTRAMUSCULAR | Status: DC
  Administered 2014-03-06 – 2014-03-12 (×11): 10 mL

## 2014-03-06 MED ORDER — ASPIRIN 81 MG PO CHEW: 81.0000 mg | CHEWABLE_TABLET | ORAL | Status: DC

## 2014-03-06 MED ORDER — ATORVASTATIN CALCIUM 20 MG PO TABS
20.0000 mg | ORAL_TABLET | Freq: Every day | ORAL | Status: DC
  Administered 2014-03-06 – 2014-03-11 (×6): 20 mg via ORAL
  Filled 2014-03-06 (×7): qty 1

## 2014-03-06 MED ORDER — METOPROLOL SUCCINATE ER 25 MG PO TB24
25.0000 mg | ORAL_TABLET | Freq: Every day | ORAL | Status: DC
  Administered 2014-03-06: 25 mg via ORAL
  Filled 2014-03-06 (×2): qty 1

## 2014-03-06 MED ORDER — HEPARIN (PORCINE) IN NACL 2-0.9 UNIT/ML-% IJ SOLN
INTRAMUSCULAR | Status: AC
  Filled 2014-03-06: qty 1000

## 2014-03-06 NOTE — Care Management Note (Addendum)
    Page 1 of 1   03/10/2014     9:29:34 AM CARE MANAGEMENT NOTE 03/10/2014  Patient:  [name] [name], [name] [name]   Account Number:  [id number]  Date Initiated:  03/06/2014  Documentation initiated by:  Elissa Hefty  Subjective/Objective Assessment:   adm w heart failure     Action/Plan:   lives w caregive and has supp son   Anticipated DC Date:     Anticipated DC Plan:        DC Planning Services  CM consult      Choice offered to / List presented to:             Status of service:   Medicare Important Message given?  YES (If response is "NO", the following Medicare IM given date fields will be blank) Date Medicare IM given:  03/10/2014 Medicare IM given by:  Elissa Hefty Date Additional Medicare IM given:   Additional Medicare IM given by:    Discharge Disposition:    Per UR Regulation:  Reviewed for med. necessity/level of care/duration of stay  If discussed at [place] of Stay Meetings, dates discussed:   03/11/2014    Comments:

## 2014-03-06 NOTE — Interval H&P Note (Signed)
History and Physical Interval Note:  03/06/2014 12:29 PM  [name] [name]  has presented today for surgery, with the diagnosis of heart failure  The various methods of treatment have been discussed with the patient and family. After consideration of risks, benefits and other options for treatment, the patient has consented to  Procedure(s): RIGHT HEART CATH (N/A) as a surgical intervention .  The patient's history has been reviewed, patient examined, no change in status, stable for surgery.  I have reviewed the patient's chart and labs.  Questions were answered to the patient's satisfaction.     [name] [employer]

## 2014-03-06 NOTE — CV Procedure (Signed)
    Cardiac Catheterization Procedure Note  Name: [name] [name] MRN: [id number] DOB: 1931/07/26  Procedure: Right Heart Cath  Indication: CHF, pulmonary hypertension   Procedural Details: The right groin was prepped, draped, and anesthetized with 1% lidocaine. Using the modified Seldinger technique a  7 French sheath was placed in the right femoral vein. A Swan-Ganz catheter was used for the right heart catheterization. Standard protocol was followed for recording of right heart pressures and sampling of oxygen saturations. Fick and thermodilution cardiac output was calculated. There were no immediate procedural complications. The patient was transferred to the post catheterization recovery area for further monitoring.  Procedural Findings: Hemodynamics (mmHg) RA mean 27 RV 68/27 PA 63/27, mean 46 PCWP mean 36  Oxygen saturations: PA 45% AO 91%  Cardiac Output (Fick) 3.77  Cardiac Index (Fick) 1.71 PVR 2.6 WU  Cardiac Output (Thermo) 3.28 Cardiac Index (Thermo) 1.49   Final Conclusions:  Severely elevated right and left heart filling pressures with low cardiac output.  Pulmonary venous hypertension. I am going to admit the patient for IV diuresis.  I will start him on milrinone gtt.  I discussed this with son and caregiver.   Loralie Champagne 03/06/2014, 1:32 PM

## 2014-03-06 NOTE — Progress Notes (Signed)
JENNIFER,RN NOTIFIED UNABLE TO START 2ND IV AND PER JENNIFER CLIENT DOES NOT NEED 2ND IV AND NO ASPIRIN NEEDED

## 2014-03-06 NOTE — Progress Notes (Signed)
Quick Note:  lmtcb ______ 

## 2014-03-06 NOTE — H&P (View-Only) (Signed)
Patient ID: [name] [name], male   DOB: Nov 28, 1930, 80 y.o.   MRN: [id number] PCP: Dr. Sarita Haver Referring MD: [name]  80 yo with history of possible interstitial lung disease, metapneumovirus PNA in 5/15 with intubation, CKD, ?dementia, and CHF (primarily diastolic) presents for cardiology evaluation.  He was referred by [name] who has been following him for interstitial lung disease.  PFTs in 1/15 showed restrictive spirometry/lung volumes.  Full ILD workup has not been completed.  Most recent echo in 5/15 showed mild global hypokinesis, EF 45% with mildly dilated and dysfunctional RV and moderate pulmonary hypertension.  Patient has chronic right foot ulcers with mildly depressed ABIs.  He had an admission for CHF in 3/15, and as above, he had an admission with respiratory failure and metapneumovirus PNA in 5/15.    Patient lives in a nursing home currently.  He is very inactive, using a wheelchair to get around.  Much of history comes from his caregiver.  She is concerned that he has a component of dementia though apparently this has not been formally diagnosed.  He is short of breath with any exertion and has been this way for some time.  He has been wheezing and coughing.  He is very weak.  He is orthopneic and sleeps on his side or on several pillows.  He has non-exertional chest pain that is relatively mild several times a week.  No syncope, lightheadedness.  No tachypalpitations.    ECG (5/15): NSR, LVH with repolarization abnormalities  Labs (5/15): K 3.5, creatinine 1.35, HCT 42.3  PMH: 1. Possible interstitial lung disease: Followed by [name].  PFTs (1/15) with FVC 53%, FEV1 46%, ratio 85%, TLC 62%, DLCO 45% (restrictive defect).  2. Metapneumovirus PNA in 5/15 with acute respiratory failure and intubation.  Bilateral transudative pleural effusions.  3. CKD: Followed by Dr. [name] 4. Type II diabetes: Currently diet-controlled.  5. ?Dementia: Caregiver  concerned this is present, apparently no formal diagnosis.  6. HTN 7. Primarily diastolic CHF: Echo (4/16) with EF 45%, mild global hypokinesis, mild MR, mild to moderate LAE, mild MR, RV mildly dilated with mildly decreased systolic function, PA systolic pressure 59 mmHg.  8. PAD: 3/15 ABIs 0.7 on right, 0.8 on left.  Nonhealing ulcers on right foot.   SH: Lives a [facility] currently, has son, comes in with caregiver.  Prior smoker.  No ETOH.   FH: "heart disease"  ROS: All systems reviewed and negative except as per HPI.   Current Outpatient Prescriptions  Medication Sig Dispense Refill  . albuterol (PROVENTIL HFA;VENTOLIN HFA) 108 (90 BASE) MCG/ACT inhaler Inhale 2 puffs into the lungs every 4 (four) hours as needed for wheezing or shortness of breath.       Marland Kitchen albuterol (PROVENTIL) (2.5 MG/3ML) 0.083% nebulizer solution Take 2.5 mg by nebulization 3 (three) times daily.       Marland Kitchen amLODipine (NORVASC) 10 MG tablet Take 10 mg by mouth daily.      Marland Kitchen aspirin EC 81 MG tablet Take 81 mg by mouth daily.      . collagenase (SANTYL) ointment Apply topically daily.  15 g  0  . fenofibrate 160 MG tablet Take 160 mg by mouth daily.      Marland Kitchen lisinopril (ZESTRIL) 2.5 MG tablet Take 1 tablet (2.5 mg total) by mouth daily.  30 tablet  1  . metoprolol succinate (TOPROL-XL) 25 MG 24 hr tablet Take 25 mg by mouth daily.      Marland Kitchen torsemide (  DEMADEX) 20 MG tablet Take 2 tablets (40 mg total) by mouth 2 (two) times daily.      . traMADol (ULTRAM) 50 MG tablet Take 50 mg by mouth every 6 (six) hours as needed.      . TRAVATAN Z 0.004 % SOLN ophthalmic solution Place 1 drop into the left eye at bedtime.       . traZODone (DESYREL) 50 MG tablet Take 50 mg by mouth at bedtime.      Marland Kitchen atorvastatin (LIPITOR) 20 MG tablet Take 1 tablet (20 mg total) by mouth daily.  90 tablet  3  . potassium chloride (K-DUR) 10 MEQ tablet Take 1 tablet (10 mEq total) by mouth daily.  90 tablet  3   No current facility-administered  medications for this encounter.   BP 130/76  Pulse 83  Wt 232 lb 12.8 oz (105.597 kg)  SpO2 97% General: NAD, chronically ill-appearing in wheelchair Neck: JVP 14-16 cm, no thyromegaly or thyroid nodule.  Lungs: Crackles at bases bilaterally.  CV: Nondisplaced PMI.  Heart regular S1/S2, no M3/O1, 2/6 systolic murmur apex/LLSB.  2+ edema to thighs.  No carotid bruit.  Unable to palpate pedal pulses.   Abdomen: Soft, nontender, no hepatosplenomegaly, no distention.  Skin: wrapped ulcerations on the right ankle and foot  Neurologic: Alert and oriented x 3.  Psych: Normal affect. Extremities: No clubbing or cyanosis.  HEENT: Normal.   Assessment/Plan: 1. Chronic primarily diastolic CHF: EF 77% with diffuse hypokinesis on last echo.  Moderate pulmonary hypertension, suspect pulmonary venous hypertension in the setting of elevated left atrial pressure.  He is massively volume overloaded with NYHA class IV symptoms.  This has been stable for weeks.   - Increase torsemide to 40 mg bid and add KCl 10 daily.  - BMET/BNP today and in 10 days.  - I will arrange for right heart catheterization next week.  - Given mildly decreased EF and CHF, will arrange for Lexiscan Cardiolite to assess for ischemia.  Will avoid left heart catheterization for now given CKD and my suspicion that CAD does not play a major role here.  - If unable to diurese effectively as an outpatient, he may require admission for IV diuresis.  2. Interstitial lung disease: Restrictive PFTs.  He has not completed a full workup of this yet.  Per [name].   3. CKD: Follow creatinine closely with diuresis.  4. PAD: ABIs only mildly decreased though he has right foot ulcerations.  Continue ASA 81.  He should be on a statin, I will start atorvastatin 20 mg daily with lipids/LFTs in 2 months.    Followup in 2 wks.   Loralie Champagne 02/26/2014

## 2014-03-06 NOTE — Telephone Encounter (Signed)
Result Note     Persistent Right pleural effusion    Undergoing workup with cards with plans for right heart cath this week    Increased diuresis by cards     Clinically improved     May need thoracentesis if persists or worsens     Please contact office for sooner follow up if symptoms do not improve or worsen or seek emergency care     follow up cxr on return ov with [name] in 4 weeks    --  Called spoke with Rodena Piety. Aware of results. Pt is scheduled to see cards today. appt scheduled for 04/08/14 at 11:30. Nothing further needed

## 2014-03-06 NOTE — Progress Notes (Signed)
Peripherally Inserted Central Catheter/Midline Placement  The IV Nurse has discussed with the patient and/or persons authorized to consent for the patient, the purpose of this procedure and the potential benefits and risks involved with this procedure.  The benefits include less needle sticks, lab draws from the catheter and patient may be discharged home with the catheter.  Risks include, but not limited to, infection, bleeding, blood clot (thrombus formation), and puncture of an artery; nerve damage and irregular heat beat.  Alternatives to this procedure were also discussed.  PICC/Midline Placement Documentation    Telephone consent from son    Murvin Natal 03/06/2014, 5:19 PM

## 2014-03-07 DIAGNOSIS — I509 Heart failure, unspecified: Secondary | ICD-10-CM

## 2014-03-07 DIAGNOSIS — I359 Nonrheumatic aortic valve disorder, unspecified: Secondary | ICD-10-CM

## 2014-03-07 DIAGNOSIS — N183 Chronic kidney disease, stage 3 unspecified: Secondary | ICD-10-CM

## 2014-03-07 DIAGNOSIS — I5033 Acute on chronic diastolic (congestive) heart failure: Principal | ICD-10-CM

## 2014-03-07 LAB — GLUCOSE, CAPILLARY
GLUCOSE-CAPILLARY: 84 mg/dL (ref 70–99)
Glucose-Capillary: 138 mg/dL — ABNORMAL HIGH (ref 70–99)

## 2014-03-07 LAB — CBC
HCT: 32.4 % — ABNORMAL LOW (ref 39.0–52.0)
Hemoglobin: 10.9 g/dL — ABNORMAL LOW (ref 13.0–17.0)
MCH: 32.2 pg (ref 26.0–34.0)
MCHC: 33.6 g/dL (ref 30.0–36.0)
MCV: 95.9 fL (ref 78.0–100.0)
PLATELETS: 290 10*3/uL (ref 150–400)
RBC: 3.38 MIL/uL — ABNORMAL LOW (ref 4.22–5.81)
RDW: 14.1 % (ref 11.5–15.5)
WBC: 6.4 10*3/uL (ref 4.0–10.5)

## 2014-03-07 LAB — COMPREHENSIVE METABOLIC PANEL
ALBUMIN: 2.7 g/dL — AB (ref 3.5–5.2)
ALK PHOS: 56 U/L (ref 39–117)
ALK PHOS: 54 U/L (ref 39–117)
ALT: 21 U/L (ref 0–53)
ALT: 19 U/L (ref 0–53)
ANION GAP: 14 (ref 5–15)
ANION GAP: 10 (ref 5–15)
AST: 39 U/L — ABNORMAL HIGH (ref 0–37)
AST: 38 U/L — ABNORMAL HIGH (ref 0–37)
Albumin: 3 g/dL — ABNORMAL LOW (ref 3.5–5.2)
BILIRUBIN TOTAL: 2 mg/dL — AB (ref 0.3–1.2)
BUN: 31 mg/dL — AB (ref 6–23)
BUN: 32 mg/dL — AB (ref 6–23)
CHLORIDE: 95 meq/L — AB (ref 96–112)
CO2: 27 meq/L (ref 19–32)
CO2: 28 mEq/L (ref 19–32)
CREATININE: 1.37 mg/dL — AB (ref 0.50–1.35)
Calcium: 8.5 mg/dL (ref 8.4–10.5)
Calcium: 7.9 mg/dL — ABNORMAL LOW (ref 8.4–10.5)
Chloride: 97 mEq/L (ref 96–112)
Creatinine, Ser: 1.26 mg/dL (ref 0.50–1.35)
GFR calc Af Amer: 60 mL/min — ABNORMAL LOW (ref >90)
GFR calc non Af Amer: 47 mL/min — ABNORMAL LOW (ref >90)
GFR, EST AFRICAN AMERICAN: 54 mL/min — AB (ref >90)
GFR, EST NON AFRICAN AMERICAN: 52 mL/min — AB (ref >90)
GLUCOSE: 157 mg/dL — AB (ref 70–99)
Glucose, Bld: 141 mg/dL — ABNORMAL HIGH (ref 70–99)
POTASSIUM: 3.6 meq/L — AB (ref 3.7–5.3)
Potassium: 3.6 mEq/L — ABNORMAL LOW (ref 3.7–5.3)
Sodium: 136 mEq/L — ABNORMAL LOW (ref 137–147)
Sodium: 135 mEq/L — ABNORMAL LOW (ref 137–147)
Total Bilirubin: 1.4 mg/dL — ABNORMAL HIGH (ref 0.3–1.2)
Total Protein: 6.6 g/dL (ref 6.0–8.3)
Total Protein: 6.2 g/dL (ref 6.0–8.3)

## 2014-03-07 LAB — CARBOXYHEMOGLOBIN
CARBOXYHEMOGLOBIN: 2.3 % — AB (ref 0.5–1.5)
Methemoglobin: 0.5 % (ref 0.0–1.5)
O2 Saturation: 64.7 %
Total hemoglobin: 11.2 g/dL — ABNORMAL LOW (ref 13.5–18.0)

## 2014-03-07 MED ORDER — CARVEDILOL 3.125 MG PO TABS
3.1250 mg | ORAL_TABLET | Freq: Two times a day (BID) | ORAL | Status: DC
  Administered 2014-03-07 – 2014-03-12 (×11): 3.125 mg via ORAL
  Filled 2014-03-07 (×13): qty 1

## 2014-03-07 MED ORDER — MILRINONE IN DEXTROSE 20 MG/100ML IV SOLN
0.2500 ug/kg/min | INTRAVENOUS | Status: DC
  Administered 2014-03-07 – 2014-03-10 (×6): 0.25 ug/kg/min via INTRAVENOUS
  Filled 2014-03-07 (×7): qty 100

## 2014-03-07 MED ORDER — LISINOPRIL 5 MG PO TABS
5.0000 mg | ORAL_TABLET | Freq: Every day | ORAL | Status: DC
  Administered 2014-03-07 – 2014-03-10 (×4): 5 mg via ORAL
  Filled 2014-03-07 (×4): qty 1

## 2014-03-07 MED ORDER — POTASSIUM CHLORIDE ER 10 MEQ PO TBCR
20.0000 meq | EXTENDED_RELEASE_TABLET | Freq: Once | ORAL | Status: AC
  Administered 2014-03-08: 20 meq via ORAL
  Filled 2014-03-07 (×2): qty 2

## 2014-03-07 MED ORDER — BIOTENE DRY MOUTH MT LIQD
15.0000 mL | Freq: Two times a day (BID) | OROMUCOSAL | Status: DC
  Administered 2014-03-07 – 2014-03-12 (×11): 15 mL via OROMUCOSAL

## 2014-03-07 MED ORDER — ISOSORB DINITRATE-HYDRALAZINE 20-37.5 MG PO TABS
0.5000 | ORAL_TABLET | Freq: Three times a day (TID) | ORAL | Status: DC
  Administered 2014-03-07 – 2014-03-10 (×9): 0.5 via ORAL
  Filled 2014-03-07 (×13): qty 0.5

## 2014-03-07 NOTE — Progress Notes (Signed)
  Echocardiogram 2D Echocardiogram has been performed.  [name] [name] 03/07/2014, 1:55 PM

## 2014-03-07 NOTE — Progress Notes (Signed)
1130 Since pt from [place], will let PT evaluate. We will begin seeing when pt walking with safety device. Will continue to follow. Graylon Good RN BSN 03/07/2014 11:32 AM

## 2014-03-07 NOTE — Progress Notes (Signed)
Patient ID: [name] [name], male   DOB: 1931-05-08, 80 y.o.   MRN: [id number]   SUBJECTIVE:  Much more alert this morning.  Says he feels better.  Good diuresis yesterday.  CVP remains 18.  Co-ox 65% today.   RHC 7/23 RA mean 27  RV 68/27  PA 63/27, mean 46  PCWP mean 36  Oxygen saturations:  PA 45%  AO 91%  Cardiac Output (Fick) 3.77  Cardiac Index (Fick) 1.71  PVR 2.6 WU  Cardiac Output (Thermo) 3.28  Cardiac Index (Thermo) 1.49    Scheduled Meds: . aspirin  81 mg Oral Daily  . atorvastatin  20 mg Oral q1800  . carvedilol  3.125 mg Oral BID WC  . heparin  5,000 Units Subcutaneous 3 times per day  . isosorbide-hydrALAZINE  0.5 tablet Oral TID  . latanoprost  1 drop Left Eye QHS  . lisinopril  5 mg Oral Daily  . sodium chloride  10-40 mL Intracatheter Q12H  . sodium chloride  3 mL Intravenous Q12H   Continuous Infusions: . furosemide (LASIX) infusion 10 mg/hr (03/07/14 0700)  . milrinone 0.25 mcg/kg/min (03/07/14 0700)   PRN Meds:.sodium chloride, acetaminophen, ondansetron (ZOFRAN) IV, sodium chloride, sodium chloride    Filed Vitals:   03/07/14 0400 03/07/14 0500 03/07/14 0600 03/07/14 0700  BP: 159/52 123/78 125/37 152/51  Pulse: 84 83 80 83  Temp:      TempSrc:      Resp: 18 16 17 14   Height:      Weight:      SpO2: 100% 100% 98% 99%    Intake/Output Summary (Last 24 hours) at 03/07/14 0740 Last data filed at 03/07/14 0700  Gross per 24 hour  Intake 355.85 ml  Output   4875 ml  Net -4519.15 ml    LABS: Basic Metabolic Panel:  Recent Labs  03/06/14 1058 03/06/14 1800 03/07/14 0415  NA 136*  --  136*  K 3.9  --  3.6*  CL 94*  --  95*  CO2 29  --  27  GLUCOSE 99  --  141*  BUN 33*  --  31*  CREATININE 1.39* 1.31 1.26  CALCIUM 8.8  --  8.5   Liver Function Tests:  Recent Labs  03/07/14 0415  AST 39*  ALT 21  ALKPHOS 56  BILITOT 2.0*  PROT 6.6  ALBUMIN 3.0*   No results found for this basename: LIPASE, AMYLASE,  in the last 72  hours CBC:  Recent Labs  03/06/14 1800 03/07/14 0415  WBC 6.5 6.4  HGB 11.6* 10.9*  HCT 33.8* 32.4*  MCV 96.0 95.9  PLT 317 290   Cardiac Enzymes: No results found for this basename: CKTOTAL, CKMB, CKMBINDEX, TROPONINI,  in the last 72 hours BNP: No components found with this basename: POCBNP,  D-Dimer: No results found for this basename: DDIMER,  in the last 72 hours Hemoglobin A1C: No results found for this basename: HGBA1C,  in the last 72 hours Fasting Lipid Panel: No results found for this basename: CHOL, HDL, LDLCALC, TRIG, CHOLHDL, LDLDIRECT,  in the last 72 hours Thyroid Function Tests:  Recent Labs  03/06/14 1539  TSH 3.120   Anemia Panel: No results found for this basename: VITAMINB12, FOLATE, FERRITIN, TIBC, IRON, RETICCTPCT,  in the last 72 hours  RADIOLOGY: Dg Chest 1 View  03/03/2014   CLINICAL DATA:  Cough, ex-smoker  EXAM: CHEST - 1 VIEW  COMPARISON:  01/29/2014  FINDINGS: There is a moderate right pleural effusion  with basilar atelectasis. There is no focal parenchymal opacity, left pleural effusion, or pneumothorax. There is stable cardiomegaly. There is thoracic aortic atherosclerosis.  The osseous structures are unremarkable.  IMPRESSION: Moderate right pleural effusion with basilar atelectasis.   Electronically Signed   By: Kathreen Devoid   On: 03/03/2014 13:39    PHYSICAL EXAM General: NAD Neck: JVP 12-14 cm, no thyromegaly or thyroid nodule.  Lungs: Crackles at bases bilaterally. CV: Nondisplaced PMI.  Heart regular S1/S2, no S3/S4, 1/6 HSM LLSB.  2+ edema 1/2 up lower legs bilaterally.  No carotid bruit.   Abdomen: Soft, nontender, no hepatosplenomegaly, no distention.  Neurologic: Alert and oriented x 3.  Psych: Normal affect. Extremities: No clubbing or cyanosis.   TELEMETRY: Reviewed telemetry pt in NSR  ASSESSMENT AND PLAN: 80 yo with primarily diastolic CHF and RV failure admitted with massive volume overload and low cardiac output.   1.  Acute on chronic primarily diastolic CHF with RV dysfunction: EF 45%, decreased RV function on prior echo.  Severely volume overloaded on exam, RHC with elevated filling pressures and low cardiac output.  Milrinone begun for RV support and now on Lasix gtt with good diuresis overnight.  Co-ox is improved this morning.  CVP still 18.  - Change Toprol XL to Coreg - Increase lisinopril to 5 mg daily. - Add Bidil 1/2 tab tid (BP running high).  - I did not do LHC yesterday given CKD and suspicion that CHF/cardiomyopathy is probably not primarily ischemic.  I had planned on getting outpatient Cardiolite, but think would be easier to do this as inpatient.  I will arrange for tomorrow.  2. CKD: Creatinine improved with milrinone.  3. ?Dementia: Not formally diagnosed but suspected.  Patient is much clearer this morning with diuresis and milrinone.  4. Will get PT assessment.  Apparently not getting out of bed at all at nursing home.   Loralie Champagne 03/07/2014 7:53 AM

## 2014-03-07 NOTE — Evaluation (Signed)
Physical Therapy Evaluation Patient Details Name: [name] [name] MRN: [id number] DOB: May 23, 1931 Today's Date: 03/07/2014   History of Present Illness  Pt is an 80 y/o male admitted s/p  acute on chronic diastolic CHF. Previously living in a SNF.   Clinical Impression  Pt admitted with the above. Pt currently with functional limitations due to the deficits listed below (see PT Problem List). At the time of PT eval pt required +2 assist to take a few side steps at EOB. At baseline, it appears pt is at a transfers-only level, however pt states he would like to be able to walk more. Pt will benefit from skilled PT to increase their independence and safety with mobility to allow discharge to the venue listed below.      Follow Up Recommendations SNF;Supervision/Assistance - 24 hour    Equipment Recommendations  None recommended by PT    Recommendations for Other Services       Precautions / Restrictions Precautions Precautions: Fall Restrictions Weight Bearing Restrictions: No      Mobility  Bed Mobility Overal bed mobility: Needs Assistance;+2 for physical assistance Bed Mobility: Supine to Sit     Supine to sit: Mod assist;+2 for physical assistance     General bed mobility comments: Assist for initiation of movement and trunk support as pt transfers to sitting position. Bed pad used to scoot hips around to EOB. Pt very lethargic and sleepy at beginning of session and increased assist needed could be attributed to this.   Transfers Overall transfer level: Needs assistance Equipment used: 2 person hand held assist Transfers: Sit to/from Stand Sit to Stand: Mod assist;+2 physical assistance         General transfer comment: Pt was able to stand EOB with bilateral support from therapist and tech. Assist for anterior lean and VC's for increased hip/knee extension.   Ambulation/Gait Ambulation/Gait assistance: Mod assist;+2 physical assistance Ambulation Distance (Feet): 2  Feet Assistive device: 2 person hand held assist Gait Pattern/deviations: Step-to pattern;Decreased stride length;Leaning posteriorly Gait velocity: Very slow Gait velocity interpretation: Below normal speed for age/gender General Gait Details: Pt able to take side steps towards [facility] with +2 mod assist for support. Pt required increased support during weight shifting to offload each foot to advance.   Stairs            Wheelchair Mobility    Modified Rankin (Stroke Patients Only)       Balance Overall balance assessment: Needs assistance Sitting-balance support: Feet supported;No upper extremity supported Sitting balance-Leahy Scale: Poor Sitting balance - Comments: Requires at least 1 UE assist to maintain sitting balance.  Postural control: Posterior lean Standing balance support: Bilateral upper extremity supported Standing balance-Leahy Scale: Zero Standing balance comment: Requires mod +2 assist to maintain balance in standing.                              Pertinent Vitals/Pain Vitals stable throughout session. Pt on 2L/min supplemental O2 when PT entered. On RA for transfers and sats decreased to mid-80's by end of session. Supplemental O2 was again donned at end of session.     Home Living Family/patient expects to be discharged to:: Skilled nursing facility                      Prior Function Level of Independence: Needs assistance   Gait / Transfers Assistance Needed: Not ambulating, but able to perform transfers per  pt.   ADL's / Homemaking Assistance Needed: Assist for bathing per pt        Hand Dominance   Dominant Hand: Right    Extremity/Trunk Assessment   Upper Extremity Assessment: Defer to OT evaluation           Lower Extremity Assessment: Generalized weakness      Cervical / Trunk Assessment: Kyphotic  Communication   Communication: No difficulties  Cognition Arousal/Alertness: Lethargic Behavior During  Therapy: Flat affect (Tearful at times when talking about his family) Overall Cognitive Status: No family/caregiver present to determine baseline cognitive functioning                      General Comments      Exercises        Assessment/Plan    PT Assessment Patient needs continued PT services  PT Diagnosis Difficulty walking;Generalized weakness   PT Problem List Decreased strength;Decreased range of motion;Decreased activity tolerance;Decreased balance;Decreased mobility;Decreased knowledge of use of DME;Decreased safety awareness;Decreased knowledge of precautions  PT Treatment Interventions DME instruction;Gait training;Stair training;Functional mobility training;Therapeutic activities;Therapeutic exercise;Neuromuscular re-education;Patient/family education   PT Goals (Current goals can be found in the Care Plan section) Acute Rehab PT Goals Patient Stated Goal: To walk more PT Goal Formulation: With patient Time For Goal Achievement: 03/21/14 Potential to Achieve Goals: Fair    Frequency Min 2X/week   Barriers to discharge        Co-evaluation               End of Session Equipment Utilized During Treatment: Gait belt;Oxygen Activity Tolerance: Patient limited by lethargy Patient left: in bed;with call bell/phone within reach;with bed alarm set Nurse Communication: Mobility status         Time: 7793-9030 PT Time Calculation (min): 30 min   Charges:   PT Evaluation $Initial PT Evaluation Tier I: 1 Procedure PT Treatments $Gait Training: 8-22 mins $Therapeutic Activity: 8-22 mins   PT G Codes:          Jolyn Lent 03/07/2014, 1:13 PM  Jolyn Lent, PT, DPT Acute Rehabilitation Services Pager: [phone]

## 2014-03-08 ENCOUNTER — Encounter (HOSPITAL_COMMUNITY): Payer: Self-pay

## 2014-03-08 LAB — BASIC METABOLIC PANEL
Anion gap: 12 (ref 5–15)
BUN: 30 mg/dL — ABNORMAL HIGH (ref 6–23)
CO2: 28 mEq/L (ref 19–32)
Calcium: 8.4 mg/dL (ref 8.4–10.5)
Chloride: 95 mEq/L — ABNORMAL LOW (ref 96–112)
Creatinine, Ser: 1.32 mg/dL (ref 0.50–1.35)
GFR, EST AFRICAN AMERICAN: 57 mL/min — AB (ref >90)
GFR, EST NON AFRICAN AMERICAN: 49 mL/min — AB (ref >90)
Glucose, Bld: 123 mg/dL — ABNORMAL HIGH (ref 70–99)
POTASSIUM: 3.5 meq/L — AB (ref 3.7–5.3)
Sodium: 135 mEq/L — ABNORMAL LOW (ref 137–147)

## 2014-03-08 LAB — CARBOXYHEMOGLOBIN
CARBOXYHEMOGLOBIN: 1.8 % — AB (ref 0.5–1.5)
METHEMOGLOBIN: 0.6 % (ref 0.0–1.5)
O2 Saturation: 62.5 %
TOTAL HEMOGLOBIN: 10.9 g/dL — AB (ref 13.5–18.0)

## 2014-03-08 LAB — CBC
HCT: 31.9 % — ABNORMAL LOW (ref 39.0–52.0)
Hemoglobin: 10.7 g/dL — ABNORMAL LOW (ref 13.0–17.0)
MCH: 32.1 pg (ref 26.0–34.0)
MCHC: 33.5 g/dL (ref 30.0–36.0)
MCV: 95.8 fL (ref 78.0–100.0)
PLATELETS: 286 10*3/uL (ref 150–400)
RBC: 3.33 MIL/uL — ABNORMAL LOW (ref 4.22–5.81)
RDW: 13.9 % (ref 11.5–15.5)
WBC: 6.7 10*3/uL (ref 4.0–10.5)

## 2014-03-08 NOTE — Progress Notes (Signed)
Patient ID: [name] [name], male   DOB: 1931-06-12, 80 y.o.   MRN: [id number]   SUBJECTIVE:  No chest pain; dyspnea improving  RHC 7/23 RA mean 27  RV 68/27  PA 63/27, mean 46  PCWP mean 36  Oxygen saturations:  PA 45%  AO 91%  Cardiac Output (Fick) 3.77  Cardiac Index (Fick) 1.71  PVR 2.6 WU  Cardiac Output (Thermo) 3.28  Cardiac Index (Thermo) 1.49    Scheduled Meds: . antiseptic oral rinse  15 mL Mouth Rinse BID  . aspirin  81 mg Oral Daily  . atorvastatin  20 mg Oral q1800  . carvedilol  3.125 mg Oral BID WC  . heparin  5,000 Units Subcutaneous 3 times per day  . isosorbide-hydrALAZINE  0.5 tablet Oral TID  . latanoprost  1 drop Left Eye QHS  . lisinopril  5 mg Oral Daily  . sodium chloride  10-40 mL Intracatheter Q12H  . sodium chloride  3 mL Intravenous Q12H   Continuous Infusions: . furosemide (LASIX) infusion 10 mg/hr (03/07/14 1618)  . milrinone 0.25 mcg/kg/min (03/08/14 0800)   PRN Meds:.sodium chloride, acetaminophen, ondansetron (ZOFRAN) IV, sodium chloride, sodium chloride    Filed Vitals:   03/08/14 0700 03/08/14 0800 03/08/14 0804 03/08/14 0837  BP: 135/64 151/61 151/91 151/61  Pulse: 81 82 80 83  Temp:  97.3 F (36.3 C) 97.3 F (36.3 C)   TempSrc:  Oral Oral   Resp: 19 19 20    Height:      Weight:      SpO2: 96% 96% 95%     Intake/Output Summary (Last 24 hours) at 03/08/14 1006 Last data filed at 03/08/14 0900  Gross per 24 hour  Intake 1163.2 ml  Output   4175 ml  Net -3011.8 ml    LABS: Basic Metabolic Panel:  Recent Labs  03/07/14 2230 03/08/14 0500  NA 135* 135*  K 3.6* 3.5*  CL 97 95*  CO2 28 28  GLUCOSE 157* 123*  BUN 32* 30*  CREATININE 1.37* 1.32  CALCIUM 7.9* 8.4   Liver Function Tests:  Recent Labs  03/07/14 0415 03/07/14 2230  AST 39* 38*  ALT 21 19  ALKPHOS 56 54  BILITOT 2.0* 1.4*  PROT 6.6 6.2  ALBUMIN 3.0* 2.7*   CBC:  Recent Labs  03/07/14 0415 03/08/14 0500  WBC 6.4 6.7  HGB 10.9* 10.7*    HCT 32.4* 31.9*  MCV 95.9 95.8  PLT 290 286   Thyroid Function Tests:  Recent Labs  03/06/14 1539  TSH 3.120    RADIOLOGY: Dg Chest 1 View  03/03/2014   CLINICAL DATA:  Cough, ex-smoker  EXAM: CHEST - 1 VIEW  COMPARISON:  01/29/2014  FINDINGS: There is a moderate right pleural effusion with basilar atelectasis. There is no focal parenchymal opacity, left pleural effusion, or pneumothorax. There is stable cardiomegaly. There is thoracic aortic atherosclerosis.  The osseous structures are unremarkable.  IMPRESSION: Moderate right pleural effusion with basilar atelectasis.   Electronically Signed   By: Kathreen Devoid   On: 03/03/2014 13:39    PHYSICAL EXAM General: NAD Neck: supple Lungs: Diminished BS RLL CV: RRR 1/6 HSM LLSB.  1+ edema 1/2 up lower legs bilaterally.  Abdomen: Soft, nontender, no distention.  Neurologic: Alert and oriented x 3.  Psych: Normal affect. Extremities: 1+ edema  TELEMETRY: Reviewed telemetry pt in NSR, NSVT  ASSESSMENT AND PLAN: 80 yo with primarily diastolic CHF and RV failure admitted with massive volume overload and low  cardiac output.   1. Acute on chronic primarily diastolic CHF with RV dysfunction: EF 45%, decreased RV function on prior echo.  Remains volume overloaded on exam, RHC with elevated filling pressures and low cardiac output.  Milrinone begun for RV support and now on Lasix gtt with good diuresis. CVP 16.  - Continue coreg, lisinopril and bidil. - For nuclear study to screen for ischemia. 2. CKD: Creatinine improved with milrinone. Follow  Kirk Ruths 03/08/2014 10:06 AM

## 2014-03-08 NOTE — Progress Notes (Signed)
Patient desats to high 80's while asleep. Attempted to put on nasal cannula, however patient becoming combative and refusing to wear it.

## 2014-03-09 ENCOUNTER — Inpatient Hospital Stay (HOSPITAL_COMMUNITY): Payer: PRIVATE HEALTH INSURANCE

## 2014-03-09 DIAGNOSIS — I5023 Acute on chronic systolic (congestive) heart failure: Secondary | ICD-10-CM

## 2014-03-09 LAB — BASIC METABOLIC PANEL
ANION GAP: 11 (ref 5–15)
BUN: 27 mg/dL — ABNORMAL HIGH (ref 6–23)
CO2: 29 meq/L (ref 19–32)
CREATININE: 1.2 mg/dL (ref 0.50–1.35)
Calcium: 8.3 mg/dL — ABNORMAL LOW (ref 8.4–10.5)
Chloride: 94 mEq/L — ABNORMAL LOW (ref 96–112)
GFR calc non Af Amer: 55 mL/min — ABNORMAL LOW (ref >90)
GFR, EST AFRICAN AMERICAN: 64 mL/min — AB (ref >90)
Glucose, Bld: 146 mg/dL — ABNORMAL HIGH (ref 70–99)
Potassium: 3.5 mEq/L — ABNORMAL LOW (ref 3.7–5.3)
SODIUM: 134 meq/L — AB (ref 137–147)

## 2014-03-09 MED ORDER — REGADENOSON 0.4 MG/5ML IV SOLN
0.4000 mg | Freq: Once | INTRAVENOUS | Status: AC
  Administered 2014-03-09: 0.4 mg via INTRAVENOUS
  Filled 2014-03-09: qty 5

## 2014-03-09 MED ORDER — REGADENOSON 0.4 MG/5ML IV SOLN: 0.4000 mg | Freq: Once | INTRAVENOUS | Status: DC

## 2014-03-09 MED ORDER — REGADENOSON 0.4 MG/5ML IV SOLN
INTRAVENOUS | Status: AC
  Administered 2014-03-09: 0.4 mg via INTRAVENOUS
  Filled 2014-03-09: qty 5

## 2014-03-09 MED ORDER — TECHNETIUM TC 99M SESTAMIBI GENERIC - CARDIOLITE
10.0000 | Freq: Once | INTRAVENOUS | Status: AC | PRN
  Administered 2014-03-09: 10 via INTRAVENOUS

## 2014-03-09 MED ORDER — TECHNETIUM TC 99M SESTAMIBI - CARDIOLITE
30.0000 | Freq: Once | INTRAVENOUS | Status: AC | PRN
  Administered 2014-03-09: 30 via INTRAVENOUS

## 2014-03-09 MED ORDER — REGADENOSON 0.4 MG/5ML IV SOLN
0.4000 mg | Freq: Once | INTRAVENOUS | Status: DC
  Filled 2014-03-09: qty 5

## 2014-03-09 NOTE — Progress Notes (Signed)
Patient ID: [name] [name], male   DOB: [birth year], 80 y.o.   MRN: [id number]   SUBJECTIVE:  [place] this morning but not oriented to place.  Says he feels better.  Good diuresis yesterday.  CVP remains 14.    RHC 7/23 RA mean 27  RV 68/27  PA 63/27, mean 46  PCWP mean 36  Oxygen saturations:  PA 45%  AO 91%  Cardiac Output (Fick) 3.77  Cardiac Index (Fick) 1.71  PVR 2.6 WU  Cardiac Output (Thermo) 3.28  Cardiac Index (Thermo) 1.49    Scheduled Meds: . antiseptic oral rinse  15 mL Mouth Rinse BID  . aspirin  81 mg Oral Daily  . atorvastatin  20 mg Oral q1800  . carvedilol  3.125 mg Oral BID WC  . heparin  5,000 Units Subcutaneous 3 times per day  . isosorbide-hydrALAZINE  0.5 tablet Oral TID  . latanoprost  1 drop Left Eye QHS  . lisinopril  5 mg Oral Daily  . sodium chloride  10-40 mL Intracatheter Q12H  . sodium chloride  3 mL Intravenous Q12H   Continuous Infusions: . furosemide (LASIX) infusion 10 mg/hr (03/08/14 2037)  . milrinone 0.25 mcg/kg/min (03/08/14 2138)   PRN Meds:.sodium chloride, acetaminophen, ondansetron (ZOFRAN) IV, sodium chloride, sodium chloride    Filed Vitals:   03/09/14 0200 03/09/14 0336 03/09/14 0400 03/09/14 0500  BP: 117/32  130/38   Pulse: 84  74   Temp:  98 F (36.7 C)    TempSrc:  Oral    Resp: 16  14   Height:      Weight:    199 lb 15.3 oz (90.7 kg)  SpO2: 100%  100%     Intake/Output Summary (Last 24 hours) at 03/09/14 0814 Last data filed at 03/09/14 0528  Gross per 24 hour  Intake 1395.2 ml  Output   3150 ml  Net -1754.8 ml    LABS: Basic Metabolic Panel:  Recent Labs  03/08/14 0500 03/09/14 0439  NA 135* 134*  K 3.5* 3.5*  CL 95* 94*  CO2 28 29  GLUCOSE 123* 146*  BUN 30* 27*  CREATININE 1.32 1.20  CALCIUM 8.4 8.3*   Liver Function Tests:  Recent Labs  03/07/14 0415 03/07/14 2230  AST 39* 38*  ALT 21 19  ALKPHOS 56 54  BILITOT 2.0* 1.4*  PROT 6.6 6.2  ALBUMIN 3.0* 2.7*   No results found for  this basename: LIPASE, AMYLASE,  in the last 72 hours CBC:  Recent Labs  03/07/14 0415 03/08/14 0500  WBC 6.4 6.7  HGB 10.9* 10.7*  HCT 32.4* 31.9*  MCV 95.9 95.8  PLT 290 286   Cardiac Enzymes: No results found for this basename: CKTOTAL, CKMB, CKMBINDEX, TROPONINI,  in the last 72 hours BNP: No components found with this basename: POCBNP,  D-Dimer: No results found for this basename: DDIMER,  in the last 72 hours Hemoglobin A1C: No results found for this basename: HGBA1C,  in the last 72 hours Fasting Lipid Panel: No results found for this basename: CHOL, HDL, LDLCALC, TRIG, CHOLHDL, LDLDIRECT,  in the last 72 hours Thyroid Function Tests:  Recent Labs  03/06/14 1539  TSH 3.120   Anemia Panel: No results found for this basename: VITAMINB12, FOLATE, FERRITIN, TIBC, IRON, RETICCTPCT,  in the last 72 hours  RADIOLOGY: Dg Chest 1 View  03/03/2014   CLINICAL DATA:  Cough, ex-smoker  EXAM: CHEST - 1 VIEW  COMPARISON:  01/29/2014  FINDINGS: There is a moderate  right pleural effusion with basilar atelectasis. There is no focal parenchymal opacity, left pleural effusion, or pneumothorax. There is stable cardiomegaly. There is thoracic aortic atherosclerosis.  The osseous structures are unremarkable.  IMPRESSION: Moderate right pleural effusion with basilar atelectasis.   Electronically Signed   By: Kathreen Devoid   On: 03/03/2014 13:39    PHYSICAL EXAM General: NAD Neck: JVP 12 cm, no thyromegaly or thyroid nodule.  Lungs: Crackles at bases bilaterally. CV: Nondisplaced PMI.  Heart regular S1/S2, no S3/S4, 1/6 HSM LLSB.  1+ edema 1/2 up lower legs bilaterally.  No carotid bruit.   Abdomen: Soft, nontender, no hepatosplenomegaly, no distention.  Neurologic: Alert and oriented x 3.  Psych: Normal affect. Extremities: No clubbing or cyanosis.   TELEMETRY: Reviewed telemetry pt in NSR  ASSESSMENT AND PLAN: 80 yo with primarily diastolic CHF and RV failure admitted with massive  volume overload and low cardiac output.   1. Acute on chronic primarily diastolic CHF with RV dysfunction: EF 40-45%, mild RV dilation, PA systolic pressure 55 mmHg.  RHC with elevated filling pressures and low cardiac output.  Milrinone begun for RV support and now on Lasix gtt with ongoing good diuresis.  CVP still high. - Continue current milrinone/lasix gtt today.   - I did not do LHC Friday given CKD, however creatinine now improved on milrinone.  Severe inferior hypokinesis on echo.  Awaiting Cardiolite.  If high risk with significant ischemia, will take for cath.  If only scar, would hold off.  2. CKD: Creatinine improved with milrinone.  3. ?Dementia: Not formally diagnosed but strongly suspected.  Patient is more alert with diuresis and milrinone but still confused.  4. Continue PT.  Apparently not getting out of bed at all at nursing home.   Loralie Champagne 03/09/2014 8:14 AM

## 2014-03-09 NOTE — Progress Notes (Signed)
Pt complaining of right sided tenderness on palpation. On call fellow made aware.  Came to bedside. Orders received for repeat CMP. Will continue to monitor.

## 2014-03-10 LAB — BASIC METABOLIC PANEL
Anion gap: 12 (ref 5–15)
BUN: 22 mg/dL (ref 6–23)
CO2: 28 mEq/L (ref 19–32)
Calcium: 8.3 mg/dL — ABNORMAL LOW (ref 8.4–10.5)
Chloride: 94 mEq/L — ABNORMAL LOW (ref 96–112)
Creatinine, Ser: 1.15 mg/dL (ref 0.50–1.35)
GFR calc Af Amer: 67 mL/min — ABNORMAL LOW (ref >90)
GFR calc non Af Amer: 58 mL/min — ABNORMAL LOW (ref >90)
GLUCOSE: 109 mg/dL — AB (ref 70–99)
POTASSIUM: 3.3 meq/L — AB (ref 3.7–5.3)
Sodium: 134 mEq/L — ABNORMAL LOW (ref 137–147)

## 2014-03-10 LAB — CBC
HEMATOCRIT: 32.5 % — AB (ref 39.0–52.0)
HEMOGLOBIN: 11.1 g/dL — AB (ref 13.0–17.0)
MCH: 32.4 pg (ref 26.0–34.0)
MCHC: 34.2 g/dL (ref 30.0–36.0)
MCV: 94.8 fL (ref 78.0–100.0)
Platelets: 267 10*3/uL (ref 150–400)
RBC: 3.43 MIL/uL — AB (ref 4.22–5.81)
RDW: 13.6 % (ref 11.5–15.5)
WBC: 7.2 10*3/uL (ref 4.0–10.5)

## 2014-03-10 LAB — CARBOXYHEMOGLOBIN
Carboxyhemoglobin: 2.1 % — ABNORMAL HIGH (ref 0.5–1.5)
METHEMOGLOBIN: 0.5 % (ref 0.0–1.5)
O2 Saturation: 71.2 %
Total hemoglobin: 11.5 g/dL — ABNORMAL LOW (ref 13.5–18.0)

## 2014-03-10 LAB — MAGNESIUM: Magnesium: 1.8 mg/dL (ref 1.5–2.5)

## 2014-03-10 MED ORDER — POTASSIUM CHLORIDE 20 MEQ PO PACK
40.0000 meq | PACK | Freq: Once | ORAL | Status: DC
Start: 2014-03-10 — End: 2014-03-10

## 2014-03-10 MED ORDER — ISOSORB DINITRATE-HYDRALAZINE 20-37.5 MG PO TABS
1.0000 | ORAL_TABLET | Freq: Three times a day (TID) | ORAL | Status: DC
  Administered 2014-03-10 – 2014-03-12 (×7): 1 via ORAL
  Filled 2014-03-10 (×8): qty 1

## 2014-03-10 MED ORDER — POTASSIUM CHLORIDE CRYS ER 20 MEQ PO TBCR
40.0000 meq | EXTENDED_RELEASE_TABLET | Freq: Once | ORAL | Status: DC
Start: 2014-03-10 — End: 2014-03-10

## 2014-03-10 MED ORDER — POTASSIUM CHLORIDE 20 MEQ/15ML (10%) PO LIQD
40.0000 meq | Freq: Once | ORAL | Status: AC
  Administered 2014-03-10: 40 meq via ORAL
  Filled 2014-03-10: qty 30

## 2014-03-10 MED ORDER — POTASSIUM CHLORIDE CRYS ER 20 MEQ PO TBCR
40.0000 meq | EXTENDED_RELEASE_TABLET | Freq: Two times a day (BID) | ORAL | Status: AC
  Administered 2014-03-10 (×2): 40 meq via ORAL
  Filled 2014-03-10 (×2): qty 2

## 2014-03-10 MED ORDER — LISINOPRIL 5 MG PO TABS
5.0000 mg | ORAL_TABLET | Freq: Two times a day (BID) | ORAL | Status: DC
  Administered 2014-03-10 – 2014-03-12 (×4): 5 mg via ORAL
  Filled 2014-03-10 (×5): qty 1

## 2014-03-10 NOTE — Clinical Social Work Psychosocial (Signed)
Clinical Social Work Department BRIEF PSYCHOSOCIAL ASSESSMENT 03/10/2014  Patient:  [name] [name], [name] [name]     Account Number:  [id number]     Admit date:  03/06/2014  Clinical Social Worker:  Wylene Men  Date/Time:  03/10/2014 02:34 PM  Referred by:  Physician  Date Referred:  03/10/2014 Referred for  SNF Placement   Other Referral:   none   Interview type:  Other - See comment Other interview type:   son, [name] [phone]    PSYCHOSOCIAL DATA Living Status:  FACILITY Admitted from facility:  [place] Level of care:  [place] Primary support name:  Lyla Son Primary support relationship to patient:  CHILD, ADULT Degree of support available:   adequate    CURRENT CONCERNS Current Concerns  Post-Acute Placement   Other Concerns:   none    SOCIAL WORK ASSESSMENT / PLAN Pt is alert and oriented to self only.  CSW contacted pt son, [name], for completion of assessment.  Son reports pt has been a resident at [facility] for approximately 2 months.  Son reports pt first being admitted to SNF for STR and during the course of rehab, son states that pt was then transferred from [place] to LTC.  Son states prior to admission to [place], [place] the pt was at home with son and son's girlfriend where the son reports he would assist pt with daily activities.  Son states that, pt being in a facility instead of home has been hard to get used to.  Son has not yet come to terms with pt's new baseline/quality of life.   Assessment/plan status:  Psychosocial Support/Ongoing Assessment of Needs Other assessment/ plan:   FL2- needs updating  PASARR- confirm existing   Information/referral to community resources:   SNF-LTC    PATIENT'S/FAMILY'S RESPONSE TO PLAN OF CARE: Pt son is agreeable to pt returning to [facility] SNF/LTC upon being medically dc'd from the hospital.       Nonnie Done, Nellie [phone]  Clinical Social Work

## 2014-03-10 NOTE — Progress Notes (Signed)
Patient ID: [name] [name], male   DOB: April 18, 1931, 80 y.o.   MRN: [id number]   SUBJECTIVE:  Alert, oriented to person.  Breathing well.  Good diuresis yesterday.  CVP down to 8.    RHC 7/23 RA mean 27  RV 68/27  PA 63/27, mean 46  PCWP mean 36  Oxygen saturations:  PA 45%  AO 91%  Cardiac Output (Fick) 3.77  Cardiac Index (Fick) 1.71  PVR 2.6 WU  Cardiac Output (Thermo) 3.28  Cardiac Index (Thermo) 1.49    Scheduled Meds: . antiseptic oral rinse  15 mL Mouth Rinse BID  . aspirin  81 mg Oral Daily  . atorvastatin  20 mg Oral q1800  . carvedilol  3.125 mg Oral BID WC  . heparin  5,000 Units Subcutaneous 3 times per day  . isosorbide-hydrALAZINE  1 tablet Oral TID  . latanoprost  1 drop Left Eye QHS  . lisinopril  5 mg Oral BID  . potassium chloride  40 mEq Oral BID  . potassium chloride  40 mEq Oral Once  . sodium chloride  10-40 mL Intracatheter Q12H  . sodium chloride  3 mL Intravenous Q12H   Continuous Infusions: . furosemide (LASIX) infusion 10 mg/hr (03/09/14 2320)  . milrinone 0.25 mcg/kg/min (03/09/14 2320)   PRN Meds:.sodium chloride, acetaminophen, ondansetron (ZOFRAN) IV, sodium chloride, sodium chloride    Filed Vitals:   03/10/14 0735 03/10/14 0800 03/10/14 0924 03/10/14 0927  BP:  152/43 154/71 154/71  Pulse:  83 84   Temp: 99 F (37.2 C)     TempSrc: Oral     Resp:  18    Height:      Weight:      SpO2:  100%      Intake/Output Summary (Last 24 hours) at 03/10/14 0936 Last data filed at 03/10/14 0900  Gross per 24 hour  Intake 1054.8 ml  Output   3275 ml  Net -2220.2 ml    LABS: Basic Metabolic Panel:  Recent Labs  03/09/14 0439 03/10/14 0400  NA 134* 134*  K 3.5* 3.3*  CL 94* 94*  CO2 29 28  GLUCOSE 146* 109*  BUN 27* 22  CREATININE 1.20 1.15  CALCIUM 8.3* 8.3*  MG  --  1.8   Liver Function Tests:  Recent Labs  03/07/14 2230  AST 38*  ALT 19  ALKPHOS 54  BILITOT 1.4*  PROT 6.2  ALBUMIN 2.7*   No results found for  this basename: LIPASE, AMYLASE,  in the last 72 hours CBC:  Recent Labs  03/08/14 0500 03/10/14 0400  WBC 6.7 7.2  HGB 10.7* 11.1*  HCT 31.9* 32.5*  MCV 95.8 94.8  PLT 286 267   Cardiac Enzymes: No results found for this basename: CKTOTAL, CKMB, CKMBINDEX, TROPONINI,  in the last 72 hours BNP: No components found with this basename: POCBNP,  D-Dimer: No results found for this basename: DDIMER,  in the last 72 hours Hemoglobin A1C: No results found for this basename: HGBA1C,  in the last 72 hours Fasting Lipid Panel: No results found for this basename: CHOL, HDL, LDLCALC, TRIG, CHOLHDL, LDLDIRECT,  in the last 72 hours Thyroid Function Tests: No results found for this basename: TSH, T4TOTAL, FREET3, T3FREE, THYROIDAB,  in the last 72 hours Anemia Panel: No results found for this basename: VITAMINB12, FOLATE, FERRITIN, TIBC, IRON, RETICCTPCT,  in the last 72 hours  RADIOLOGY: Dg Chest 1 View  03/03/2014   CLINICAL DATA:  Cough, ex-smoker  EXAM: CHEST -  1 VIEW  COMPARISON:  01/29/2014  FINDINGS: There is a moderate right pleural effusion with basilar atelectasis. There is no focal parenchymal opacity, left pleural effusion, or pneumothorax. There is stable cardiomegaly. There is thoracic aortic atherosclerosis.  The osseous structures are unremarkable.  IMPRESSION: Moderate right pleural effusion with basilar atelectasis.   Electronically Signed   By: Kathreen Devoid   On: 03/03/2014 13:39    PHYSICAL EXAM General: NAD Neck: JVP 8-9 cm, no thyromegaly or thyroid nodule.  Lungs: Crackles at bases bilaterally. CV: Nondisplaced PMI.  Heart regular S1/S2, no S3/S4, 1/6 HSM LLSB.  No edema.  No carotid bruit.   Abdomen: Soft, nontender, no hepatosplenomegaly, no distention.  Neurologic: Alert and oriented x 3.  Psych: Normal affect. Extremities: No clubbing or cyanosis.   TELEMETRY: Reviewed telemetry pt in NSR  ASSESSMENT AND PLAN: 80 yo with primarily diastolic CHF and RV failure  admitted with massive volume overload and low cardiac output.   1. 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  RHC with elevated filling pressures and low cardiac output.  Milrinone begun for RV support and now on Lasix gtt with ongoing good diuresis.  CVP improving.  Weight down 22 lbs, looks and feels much better.  - Continue current milrinone/lasix gtt one more day, begin milrinone weaning tomorrow.  - I did not do LHC Friday given CKD, however creatinine now improved on milrinone.  Severe inferior hypokinesis on echo.  Cardiolite done yesterday: EF 43% with no ischemia, inferior hypokinesis with "inferior attenuation."  Suspect probable prior inferior MI.  Continue ASA and statin, would avoid LHC at this point with no ischemia on Cardiolite. 2. CKD: Creatinine improved with milrinone.  3. Dementia: Not formally diagnosed but strongly suspected.  Patient is more alert with diuresis and milrinone but still very poor memory. 4. Continue PT.  Apparently not getting out of bed at all at nursing home. Will need to go back to nursing home.   Loralie Champagne 03/10/2014 9:36 AM

## 2014-03-11 DIAGNOSIS — I739 Peripheral vascular disease, unspecified: Secondary | ICD-10-CM

## 2014-03-11 LAB — CARBOXYHEMOGLOBIN
CARBOXYHEMOGLOBIN: 2.1 % — AB (ref 0.5–1.5)
Methemoglobin: 0.6 % (ref 0.0–1.5)
O2 SAT: 77.2 %
TOTAL HEMOGLOBIN: 11.6 g/dL — AB (ref 13.5–18.0)

## 2014-03-11 LAB — BASIC METABOLIC PANEL
Anion gap: 12 (ref 5–15)
BUN: 24 mg/dL — AB (ref 6–23)
CO2: 26 mEq/L (ref 19–32)
Calcium: 8.4 mg/dL (ref 8.4–10.5)
Chloride: 94 mEq/L — ABNORMAL LOW (ref 96–112)
Creatinine, Ser: 1.29 mg/dL (ref 0.50–1.35)
GFR, EST AFRICAN AMERICAN: 58 mL/min — AB (ref >90)
GFR, EST NON AFRICAN AMERICAN: 50 mL/min — AB (ref >90)
Glucose, Bld: 103 mg/dL — ABNORMAL HIGH (ref 70–99)
Potassium: 3.9 mEq/L (ref 3.7–5.3)
Sodium: 132 mEq/L — ABNORMAL LOW (ref 137–147)

## 2014-03-11 MED ORDER — TORSEMIDE 20 MG PO TABS
40.0000 mg | ORAL_TABLET | Freq: Two times a day (BID) | ORAL | Status: DC
  Administered 2014-03-11 – 2014-03-12 (×3): 40 mg via ORAL
  Filled 2014-03-11 (×5): qty 2

## 2014-03-11 MED ORDER — MAGNESIUM SULFATE 40 MG/ML IJ SOLN
2.0000 g | Freq: Once | INTRAMUSCULAR | Status: AC
  Administered 2014-03-11: 2 g via INTRAVENOUS
  Filled 2014-03-11: qty 50

## 2014-03-11 NOTE — Progress Notes (Signed)
VASCULAR LAB PRELIMINARY  ARTERIAL  ABI completed:    RIGHT    LEFT    PRESSURE WAVEFORM  PRESSURE WAVEFORM  BRACHIAL restricted  BRACHIAL 120 triphasic  DP   DP    AT 51 Severely dampened monophasic AT 59 Severely dampened monophasic  PT 54 Dampened monophasic PT 67 Dampened monophasic  PER   PER    GREAT TOE  NA GREAT TOE  NA    RIGHT LEFT  ABI 0.45 0.56     Lisseth Brazeau, RVT 03/11/2014, 1:04 PM

## 2014-03-11 NOTE — Progress Notes (Signed)
PT Cancellation Note  Patient Details Name: [name] [name] MRN: [id number] DOB: April 07, 1931   Cancelled Treatment:    Reason Eval/Treat Not Completed: Pain limiting ability to participate. Pt declining OOB, sitting EOB, and therapeutic exercise at this time due to pain in bilateral feet. Will continue to follow.   Jolyn Lent 03/11/2014, 2:39 PM  Jolyn Lent, PT, DPT Acute Rehabilitation Services Pager: [phone]

## 2014-03-11 NOTE — Consult Note (Addendum)
WOC wound consult note Reason for Consult: Consult requested for right and foot leg wounds.  Pt has pink dry scar tissue to several areas on bilat legs from previous wounds which have healed. Left foot and leg without any wounds or drainage at this time. Pt has been followed by the outpatient wound care center in the past, according to the progress notes, but he is unable to state what topical treatment he has been using to right foot and leg.  No family members present in room at this time. Wound type:  Right heel with previous pressure ulcer which has now healed; pink dry skin intact where blister has already evolved and peeled. Right calf with healing full thickness wound; 1X1X.1cm, 100% dry scabbed woundbed without odor or drainage. Right anterior foot full thickness wound; 1X.3X.1cm, and 2X2cm,100% dry scabbed woundbed without odor or drainage. Dressing procedure/placement/frequency: Hydrogel to promote moist healing.  Foam dressing to protect and promote healing.  If a family member is aware of the name of the previous topical treatment that was used prior to admission, then orders can be adjusted at that time.  Pt can resume follow-up at the outpatient wound care center after discharge. Please re-consult if further assistance is needed.  Thank-you,  Julien Girt MSN, [place], [place], [place], [place]

## 2014-03-11 NOTE — Progress Notes (Addendum)
Patient ID: [name] [name], male   DOB: 08-19-1930, 80 y.o.   MRN: [id number]   SUBJECTIVE:  Alert, oriented to person.  Breathing well.  Good diuresis yesterday.  CVP down to 6.    RHC 7/23 RA mean 27  RV 68/27  PA 63/27, mean 46  PCWP mean 36  Oxygen saturations:  PA 45%  AO 91%  Cardiac Output (Fick) 3.77  Cardiac Index (Fick) 1.71  PVR 2.6 WU  Cardiac Output (Thermo) 3.28  Cardiac Index (Thermo) 1.49    Scheduled Meds: . antiseptic oral rinse  15 mL Mouth Rinse BID  . aspirin  81 mg Oral Daily  . atorvastatin  20 mg Oral q1800  . carvedilol  3.125 mg Oral BID WC  . heparin  5,000 Units Subcutaneous 3 times per day  . isosorbide-hydrALAZINE  1 tablet Oral TID  . latanoprost  1 drop Left Eye QHS  . lisinopril  5 mg Oral BID  . sodium chloride  10-40 mL Intracatheter Q12H  . sodium chloride  3 mL Intravenous Q12H  . torsemide  40 mg Oral BID   Continuous Infusions:   PRN Meds:.sodium chloride, acetaminophen, ondansetron (ZOFRAN) IV, sodium chloride, sodium chloride    Filed Vitals:   03/11/14 0723 03/11/14 0800 03/11/14 0900 03/11/14 0914  BP: 124/23 164/48 94/46 110/36  Pulse: 97   81  Temp: 98.4 F (36.9 C)     TempSrc: Oral     Resp: 26 17 20    Height:      Weight:      SpO2: 100%       Intake/Output Summary (Last 24 hours) at 03/11/14 0950 Last data filed at 03/11/14 0917  Gross per 24 hour  Intake 2137.8 ml  Output   4251 ml  Net -2113.2 ml    LABS: Basic Metabolic Panel:  Recent Labs  03/10/14 0400 03/11/14 0336  NA 134* 132*  K 3.3* 3.9  CL 94* 94*  CO2 28 26  GLUCOSE 109* 103*  BUN 22 24*  CREATININE 1.15 1.29  CALCIUM 8.3* 8.4  MG 1.8  --    Liver Function Tests: No results found for this basename: AST, ALT, ALKPHOS, BILITOT, PROT, ALBUMIN,  in the last 72 hours No results found for this basename: LIPASE, AMYLASE,  in the last 72 hours CBC:  Recent Labs  03/10/14 0400  WBC 7.2  HGB 11.1*  HCT 32.5*  MCV 94.8  PLT 267    Cardiac Enzymes: No results found for this basename: CKTOTAL, CKMB, CKMBINDEX, TROPONINI,  in the last 72 hours BNP: No components found with this basename: POCBNP,  D-Dimer: No results found for this basename: DDIMER,  in the last 72 hours Hemoglobin A1C: No results found for this basename: HGBA1C,  in the last 72 hours Fasting Lipid Panel: No results found for this basename: CHOL, HDL, LDLCALC, TRIG, CHOLHDL, LDLDIRECT,  in the last 72 hours Thyroid Function Tests: No results found for this basename: TSH, T4TOTAL, FREET3, T3FREE, THYROIDAB,  in the last 72 hours Anemia Panel: No results found for this basename: VITAMINB12, FOLATE, FERRITIN, TIBC, IRON, RETICCTPCT,  in the last 72 hours  RADIOLOGY: Dg Chest 1 View  03/03/2014   CLINICAL DATA:  Cough, ex-smoker  EXAM: CHEST - 1 VIEW  COMPARISON:  01/29/2014  FINDINGS: There is a moderate right pleural effusion with basilar atelectasis. There is no focal parenchymal opacity, left pleural effusion, or pneumothorax. There is stable cardiomegaly. There is thoracic aortic atherosclerosis.  The  osseous structures are unremarkable.  IMPRESSION: Moderate right pleural effusion with basilar atelectasis.   Electronically Signed   By: Kathreen Devoid   On: 03/03/2014 13:39    PHYSICAL EXAM General: NAD Neck: JVP 8 cm, no thyromegaly or thyroid nodule.  Lungs: Minimal basilar crackles CV: Nondisplaced PMI.  Heart regular S1/S2, no S3/S4, 1/6 HSM LLSB.  No edema.  No carotid bruit.   Abdomen: Soft, nontender, no hepatosplenomegaly, no distention.  Neurologic: Alert and oriented x 3.  Psych: Normal affect. Extremities: No clubbing or cyanosis.   TELEMETRY: Reviewed telemetry pt in NSR  ASSESSMENT AND PLAN: 80 yo with primarily diastolic CHF and RV failure admitted with massive volume overload and low cardiac output.   1. Acute on chronic primarily diastolic CHF with RV dysfunction: Echo with EF 40-45%, mild RV dilation, PA systolic pressure 55  mmHg.  RHC with elevated filling pressures and low cardiac output.  Milrinone begun for RV support and now on Lasix gtt with ongoing good diuresis.  CVP down to 6.  Weight down 28 lbs, looks and feels much better.  - Wean off milrinone today and stop Lasix gtt. - Torsemide 40 mg bid. - Continue current Bidil, Coreg, and lisinopril.  - Co-ox in am.   - I did not do LHC Friday given CKD, however creatinine now improved on milrinone.  Severe inferior hypokinesis on echo.  Cardiolite done yesterday: EF 43% with no ischemia, inferior hypokinesis with "inferior attenuation."  Suspect probable prior inferior MI.  Continue ASA and statin, would avoid LHC at this point with no ischemia on Cardiolite. 2. CKD: Creatinine improved with milrinone.  3. Dementia: Not formally diagnosed but strongly suspected.  Patient is more alert with diuresis and milrinone but still very poor memory. 4. Right foot ulcerations: Dry, per family have been there for months and question was raised in the past of "early gangrene."  Will have wound care consult.  Will need outpatient followup with vascular surgery.  Will get ABIs here.  5. Continue PT.  Apparently not getting out of bed at all at nursing home. Will need to go back to nursing home, hopefully tomorrow if stable off milrinone.    Loralie Champagne 03/11/2014 9:50 AM

## 2014-03-12 ENCOUNTER — Encounter (HOSPITAL_COMMUNITY): Payer: Self-pay | Admitting: Physician Assistant

## 2014-03-12 ENCOUNTER — Encounter (HOSPITAL_COMMUNITY): Payer: Medicare Other

## 2014-03-12 DIAGNOSIS — L97519 Non-pressure chronic ulcer of other part of right foot with unspecified severity: Secondary | ICD-10-CM

## 2014-03-12 DIAGNOSIS — I340 Nonrheumatic mitral (valve) insufficiency: Secondary | ICD-10-CM | POA: Diagnosis present

## 2014-03-12 DIAGNOSIS — J984 Other disorders of lung: Secondary | ICD-10-CM | POA: Insufficient documentation

## 2014-03-12 DIAGNOSIS — I739 Peripheral vascular disease, unspecified: Secondary | ICD-10-CM | POA: Diagnosis present

## 2014-03-12 DIAGNOSIS — I5043 Acute on chronic combined systolic (congestive) and diastolic (congestive) heart failure: Secondary | ICD-10-CM

## 2014-03-12 DIAGNOSIS — E785 Hyperlipidemia, unspecified: Secondary | ICD-10-CM | POA: Insufficient documentation

## 2014-03-12 DIAGNOSIS — I351 Nonrheumatic aortic (valve) insufficiency: Secondary | ICD-10-CM | POA: Diagnosis present

## 2014-03-12 DIAGNOSIS — F0390 Unspecified dementia without behavioral disturbance: Secondary | ICD-10-CM | POA: Diagnosis present

## 2014-03-12 DIAGNOSIS — E119 Type 2 diabetes mellitus without complications: Secondary | ICD-10-CM

## 2014-03-12 LAB — BASIC METABOLIC PANEL
Anion gap: 16 — ABNORMAL HIGH (ref 5–15)
BUN: 26 mg/dL — ABNORMAL HIGH (ref 6–23)
CO2: 24 mEq/L (ref 19–32)
CREATININE: 1.41 mg/dL — AB (ref 0.50–1.35)
Calcium: 8.3 mg/dL — ABNORMAL LOW (ref 8.4–10.5)
Chloride: 93 mEq/L — ABNORMAL LOW (ref 96–112)
GFR calc non Af Amer: 45 mL/min — ABNORMAL LOW (ref >90)
GFR, EST AFRICAN AMERICAN: 52 mL/min — AB (ref >90)
Glucose, Bld: 89 mg/dL (ref 70–99)
Potassium: 4 mEq/L (ref 3.7–5.3)
Sodium: 133 mEq/L — ABNORMAL LOW (ref 137–147)

## 2014-03-12 LAB — CARBOXYHEMOGLOBIN
Carboxyhemoglobin: 2.3 % — ABNORMAL HIGH (ref 0.5–1.5)
Methemoglobin: 0.6 % (ref 0.0–1.5)
O2 Saturation: 62.1 %
TOTAL HEMOGLOBIN: 12 g/dL — AB (ref 13.5–18.0)

## 2014-03-12 MED ORDER — ATORVASTATIN CALCIUM 20 MG PO TABS: 20.0000 mg | ORAL_TABLET | Freq: Every evening | ORAL | Status: DC

## 2014-03-12 MED ORDER — CARVEDILOL 6.25 MG PO TABS: 6.2500 mg | ORAL_TABLET | Freq: Two times a day (BID) | ORAL | Status: DC

## 2014-03-12 MED ORDER — ISOSORB DINITRATE-HYDRALAZINE 20-37.5 MG PO TABS: 1.0000 | ORAL_TABLET | Freq: Three times a day (TID) | ORAL | Status: AC

## 2014-03-12 MED ORDER — LISINOPRIL 5 MG PO TABS: 5.0000 mg | ORAL_TABLET | Freq: Two times a day (BID) | ORAL | Status: DC

## 2014-03-12 NOTE — Clinical Documentation Improvement (Signed)
Presents with Hip Fracture; to OR for partial hip replacement.   Pulmonary Infiltrate per CXR documented in today's Progress Note  Has non productive cough, increased opacities  Start on IV Levaquin  Please clarify with a more specific diagnosis what you are treating and document findings in next progress note and discharge summary.   Pneumonia  Atelectasis  Other Condition    Thank You, Zoila Shutter ,RN Clinical Documentation Specialist:  [phone]  [facility] [employer]

## 2014-03-12 NOTE — Progress Notes (Signed)
Per MD order, PICC line removed. Cath intact at 39cm. Vaseline pressure gauze to site, pressure held x 5min. No bleeding to site. Pt instructed to keep dressing CDI x 24 hours. Avoid heavy lifting, pushing or pulling x 24 hours,  If bleeding occurs hold pressure, if bleeding does not stop contact MD or go to the ED. Pt does not have any questions. [name] [name] M  

## 2014-03-12 NOTE — Progress Notes (Signed)
Patient ID: [name] [name], male   DOB: 01-22-1931, 80 y.o.   MRN: [id number]   SUBJECTIVE: Alert, no complaints.  Breathing well.  CVP 5 and co-ox 62% off milrinone.    RHC 7/23 RA mean 27  RV 68/27  PA 63/27, mean 46  PCWP mean 36  Oxygen saturations:  PA 45%  AO 91%  Cardiac Output (Fick) 3.77  Cardiac Index (Fick) 1.71  PVR 2.6 WU  Cardiac Output (Thermo) 3.28  Cardiac Index (Thermo) 1.49    Scheduled Meds: . antiseptic oral rinse  15 mL Mouth Rinse BID  . aspirin  81 mg Oral Daily  . atorvastatin  20 mg Oral q1800  . carvedilol  3.125 mg Oral BID WC  . heparin  5,000 Units Subcutaneous 3 times per day  . isosorbide-hydrALAZINE  1 tablet Oral TID  . latanoprost  1 drop Left Eye QHS  . lisinopril  5 mg Oral BID  . sodium chloride  10-40 mL Intracatheter Q12H  . sodium chloride  3 mL Intravenous Q12H  . torsemide  40 mg Oral BID   Continuous Infusions:   PRN Meds:.sodium chloride, acetaminophen, ondansetron (ZOFRAN) IV, sodium chloride, sodium chloride    Filed Vitals:   03/12/14 0000 03/12/14 0100 03/12/14 0420 03/12/14 0421  BP: 144/59 114/52  132/78  Pulse:  79  78  Temp: 98.2 F (36.8 C)     TempSrc: Oral     Resp: 22 23  20   Height:      Weight:   181 lb (82.1 kg)   SpO2: 100% 100%  100%    Intake/Output Summary (Last 24 hours) at 03/12/14 9833 Last data filed at 03/11/14 2200  Gross per 24 hour  Intake 1076.6 ml  Output   1900 ml  Net -823.4 ml    LABS: Basic Metabolic Panel:  Recent Labs  03/10/14 0400 03/11/14 0336 03/12/14 0450  NA 134* 132* 133*  K 3.3* 3.9 4.0  CL 94* 94* 93*  CO2 28 26 24   GLUCOSE 109* 103* 89  BUN 22 24* 26*  CREATININE 1.15 1.29 1.41*  CALCIUM 8.3* 8.4 8.3*  MG 1.8  --   --    Liver Function Tests: No results found for this basename: AST, ALT, ALKPHOS, BILITOT, PROT, ALBUMIN,  in the last 72 hours No results found for this basename: LIPASE, AMYLASE,  in the last 72 hours CBC:  Recent Labs  03/10/14 0400    WBC 7.2  HGB 11.1*  HCT 32.5*  MCV 94.8  PLT 267   Cardiac Enzymes: No results found for this basename: CKTOTAL, CKMB, CKMBINDEX, TROPONINI,  in the last 72 hours BNP: No components found with this basename: POCBNP,  D-Dimer: No results found for this basename: DDIMER,  in the last 72 hours Hemoglobin A1C: No results found for this basename: HGBA1C,  in the last 72 hours Fasting Lipid Panel: No results found for this basename: CHOL, HDL, LDLCALC, TRIG, CHOLHDL, LDLDIRECT,  in the last 72 hours Thyroid Function Tests: No results found for this basename: TSH, T4TOTAL, FREET3, T3FREE, THYROIDAB,  in the last 72 hours Anemia Panel: No results found for this basename: VITAMINB12, FOLATE, FERRITIN, TIBC, IRON, RETICCTPCT,  in the last 72 hours  RADIOLOGY: Dg Chest 1 View  03/03/2014   CLINICAL DATA:  Cough, ex-smoker  EXAM: CHEST - 1 VIEW  COMPARISON:  01/29/2014  FINDINGS: There is a moderate right pleural effusion with basilar atelectasis. There is no focal parenchymal opacity, left pleural  effusion, or pneumothorax. There is stable cardiomegaly. There is thoracic aortic atherosclerosis.  The osseous structures are unremarkable.  IMPRESSION: Moderate right pleural effusion with basilar atelectasis.   Electronically Signed   By: Kathreen Devoid   On: 03/03/2014 13:39    PHYSICAL EXAM General: NAD Neck: JVP 7 cm, no thyromegaly or thyroid nodule.  Lungs: Minimal basilar crackles CV: Nondisplaced PMI.  Heart regular S1/S2, no S3/S4, 1/6 HSM LLSB.  No edema.  No carotid bruit.   Abdomen: Soft, nontender, no hepatosplenomegaly, no distention.  Neurologic: Alert and oriented x 3.  Psych: Normal affect. Extremities: No clubbing or cyanosis. Dried ulcers on feet bilaterally.   TELEMETRY: Reviewed telemetry pt in NSR  ASSESSMENT AND PLAN: 80 yo with primarily diastolic CHF and RV failure admitted with massive volume overload and low cardiac output.   1. Acute on chronic primarily diastolic  CHF with RV dysfunction: Echo with EF 40-45%, mild RV dilation, PA systolic pressure 55 mmHg.  RHC with elevated filling pressures and low cardiac output.  Milrinone begun for RV support and diuresed with Lasix gtt.  Now off milrinone and on po torsemide.  CVP down to 5.  Weight down 31 lbs, looks and feels much better. - Torsemide 40 mg bid. - Continue current Bidil, Coreg, and lisinopril.    - I did not do LHC this admission given CKD.  Severe inferior hypokinesis on echo.  Cardiolite done: EF 43% with no ischemia, inferior hypokinesis with "inferior attenuation."  Suspect probable prior inferior MI.  Continue ASA and statin, would continue to avoid LHC at this point with no ischemia on Cardiolite. 2. CKD: Creatinine improved with milrinone.  3. Dementia: Not formally diagnosed but strongly suspected.  Patient became more alert with diuresis and milrinone but still very poor memory. 4. Right foot ulcerations: Dry, per family have been there for months and question was raised in the past of "early gangrene."  Wound care has seen.  ABIs abnormal (0.45 on right, 0.56 on left).  Will need outpatient followup with vascular. 5. May return to nursing home today. Needs followup CHF clinic in 1 week.  Needs to be seen in vascular clinic by Dr Fletcher Anon or Dr Gwenlyn Found soon.  Meds for home: torsemide 40 mg bid, ASA 81, atorvastatin 20, Bidil 1 tab tid, Coreg 6.25 mg bid, lisinopril 5 daily.    Loralie Champagne 03/12/2014 7:22 AM

## 2014-03-12 NOTE — Progress Notes (Signed)
PT Cancellation Note  Patient Details Name: [name] [name] MRN: [id number] DOB: 05-07-1931   Cancelled Treatment:    Reason Eval/Treat Not Completed: Pt to d/c back to SNF today and currently sleeping soundly. If discharge is delayed, will plan to see pt tomorrow.   Jolyn Lent 03/12/2014, 2:30 PM  Jolyn Lent, PT, DPT Acute Rehabilitation Services Pager: [phone]

## 2014-03-12 NOTE — Discharge Summary (Signed)
Discharge Summary   Patient ID: [name] [name] MRN: [id number], DOB/AGE: 1931-04-26 80 y.o. Admit date: 03/06/2014 D/C date:     03/12/2014  Primary Care Provider: [name], NP Primary Cardiologist: Aundra Dubin - CHF  Primary Discharge Diagnoses:  1. Acute on chronic combined but primarily diastolic CHF with RV dysfunction, admitted with massive volume overload - required Lasix/milrinone drip this admission - nuc this admission: EF 43% with no ischemia, inferior hypokinesis with "inferior attenuation" - Dr. Aundra Dubin suspected probable prior inferior MI (no LHC due to CKD & no ischemia) 2. CKD stage III 3. Dementia 4. Right foot ulcerations with PAD - ABIs R 0.45, L 0.56, awaiting outpt vascular f/u 5. Mild MR/AI 6. Hyponatremia 7. Diabetes mellitus, currently diet controlled 8. HTN  Secondary Discharge Diagnoses:  1. Possible interstitial lung disease: Followed by [name]. PFTs (1/15) with FVC 53%, FEV1 46%, ratio 85%, TLC 62%, DLCO 45% (restrictive defect) 2. Metapneumovirus PNA in 5/15 with acute respiratory failure and intubation. Bilateral transudative pleural effusions.  3. Hyperlipidemia 4. Arthritis 5. COPD   Hospital Course:  80 yo with history of possible interstitial lung disease, metapneumovirus PNA in 5/15 with intubation, CKD, ?dementia, and CHF (primarily diastolic) was recently referred to the CHF clinic to see Dr. Aundra Dubin on 02/26/14. He was referred by [name] who has been following him for interstitial lung disease. PFTs in 1/15 showed restrictive spirometry/lung volumes. Full ILD workup has not been completed. Prior outpatient echo in 5/15 showed mild global hypokinesis, EF 45% with mildly dilated and dysfunctional RV and moderate pulmonary hypertension. Patient has chronic right foot ulcers with mildly depressed ABIs. He had an admission for CHF in 3/15, and as above, he had an admission with respiratory failure and metapneumovirus PNA in 5/15. He is  inactive and lives at a nursing home, using a wheelchair to get around. There has been concern for dementia by his caregiver. During this office visit he was reporting SOB on exertion, wheezing, coughing, weakness, orthopnea, and mild non-exertional chest pain several times a week. There has been no syncope or tachypalpitations. EKG showed NSR with LVH and repol abnormalities. He was felt to have primarily chronic diastolic CHF. Diuretics were titrated with plan for outpatient RHC. He came into the hospital for this on 03/06/14 and this demonstrated severely elevated right and left heart filling pressures with low cardiac output.  Procedural Findings:  Hemodynamics (mmHg)  RA mean 27  RV 68/27  PA 63/27, mean 46  PCWP mean 36  Oxygen saturations:  PA 45%  AO 91%  Cardiac Output (Fick) 3.77  Cardiac Index (Fick) 1.71  PVR 2.6 WU  Cardiac Output (Thermo) 3.28  Cardiac Index (Thermo) 1.49  He was felt to have massive volume overload thus was admitted for IV diuresis with Lasix drip as well as milrinone drip. His renal function was monitored with diuresis and CHF meds were titrated including addition of Bidil, changing Toprol to Coreg, stopping amlodipine. 2D Echo 03/07/14 showed EF 40-45%, diffuse HK, grade 1 diastolic dysfunction, mild MR/AI, mod dilated LA/RA, mildly dilated RV, PA pressure 76mHg. He underwent Lexiscan nuclear stress test to evaluate for ischemic component of cardiomyopathy which showed EF 43% with no ischemia, inferior hypokinesis with "inferior attenuation." Dr. Aundra Dubin suspected probable prior inferior MI. Given CKD and no reversible ischemia, Dr. Aundra Dubin recommended to continue ASA and statin and avoid LHC at this point. During this admission, due to R foot ulcerations (dry, chronic for several months) wound care was consulted  and ulcers were dressed to promote healing. ABIs were obtained showing R 0.45, L 0.56. Dr. Aundra Dubin recommends followup with vascular physician as outpatient soon.  Overall the patient has diuresed 31lbs to a discharge weight of 181lbs (-14.7 net L). Dr. Aundra Dubin has seen and examined the patient today and feels he is stable for discharge back to nursing home. To clarify in Dr. Claris Gladden note, we will be sending him home on torsemide 40 mg bid, ASA 81, atorvastatin 20, Bidil 1 tab tid, Coreg 6.25 mg bid, lisinopril 5 bid. He has CHF clinic f/u on 8/4. I have sent a message on our office's scheduler to arrange close f/u with Dr. Fletcher Anon or Dr. Gwenlyn Found for vascular consultation, and the office will contact the patient's nursing home with this appointment.  Discharge Vitals: Blood pressure 131/40, pulse 78, temperature 97.9 F (36.6 C), temperature source Oral, resp. rate 19, height 5\' 9"  (1.753 m), weight 181 lb (82.1 kg), SpO2 95.00%.  Labs: Lab Results  Component Value Date   WBC 7.2 03/10/2014   HGB 11.1* 03/10/2014   HCT 32.5* 03/10/2014   MCV 94.8 03/10/2014   PLT 267 03/10/2014    Recent Labs Lab 03/07/14 2230  03/12/14 0450  NA 135*  < > 133*  K 3.6*  < > 4.0  CL 97  < > 93*  CO2 28  < > 24  BUN 32*  < > 26*  CREATININE 1.37*  < > 1.41*  CALCIUM 7.9*  < > 8.3*  PROT 6.2  --   --   BILITOT 1.4*  --   --   ALKPHOS 54  --   --   ALT 19  --   --   AST 38*  --   --   GLUCOSE 157*  < > 89  < > = values in this interval not displayed.  Lab Results  Component Value Date   TRIG 78 12/29/2013    Diagnostic Studies/Procedures    2D Echo 03/07/14 - Left ventricle: The cavity size was normal. Wall thickness was normal. Systolic function was mildly to moderately reduced. The estimated ejection fraction was in the range of 40% to 45%. Diffuse hypokinesis. There is severe hypokinesis of the inferior myocardium. Doppler parameters are consistent with abnormal left ventricular relaxation (grade 1 diastolic dysfunction). Doppler parameters are consistent with high ventricular filling pressure. - Aortic valve: There was mild regurgitation. - Mitral valve:  Calcified annulus. There was mild regurgitation. - Left atrium: The atrium was moderately dilated. - Right ventricle: The cavity size was mildly dilated. - Right atrium: The atrium was moderately dilated. - Pulmonary arteries: Systolic pressure was moderately increased. PA peak pressure: 55 mm Hg (S). Impressions: - Technically difficult apical images; there appears to be severe hypokinesis of the inferior wall with EF 45; biatrial enlargement; mild RVE; mild AI and MR; moderately elevated pulmonary pressures.  [place] 03/06/14 Cardiac Catheterization Procedure Note  Name: Farhad Burleson  MRN: [id number]  DOB: 1930/11/16  Procedure: Right Heart Cath  Indication: CHF, pulmonary hypertension  Procedural Details: The right groin was prepped, draped, and anesthetized with 1% lidocaine. Using the modified Seldinger technique a 7 French sheath was placed in the right femoral vein. A Swan-Ganz catheter was used for the right heart catheterization. Standard protocol was followed for recording of right heart pressures and sampling of oxygen saturations. Fick and thermodilution cardiac output was calculated. There were no immediate procedural complications. The patient was transferred to the post catheterization recovery area for  further monitoring.  Procedural Findings:  Hemodynamics (mmHg)  RA mean 27  RV 68/27  PA 63/27, mean 46  PCWP mean 36  Oxygen saturations:  PA 45%  AO 91%  Cardiac Output (Fick) 3.77  Cardiac Index (Fick) 1.71  PVR 2.6 WU  Cardiac Output (Thermo) 3.28  Cardiac Index (Thermo) 1.49  Final Conclusions: Severely elevated right and left heart filling pressures with low cardiac output. Pulmonary venous hypertension. I am going to admit the patient for IV diuresis. I will start him on milrinone gtt. I discussed this with son and caregiver.   ABI 03/11/14 ABI completed:   RIGHT    LEFT     PRESSURE  WAVEFORM   PRESSURE  WAVEFORM   BRACHIAL  restricted   BRACHIAL  120  triphasic   DP     DP     AT  51  Severely dampened monophasic  AT  59  Severely dampened monophasic   PT  54  Dampened monophasic  PT  67  Dampened monophasic   PER    PER     GREAT TOE   NA  GREAT TOE   NA     RIGHT  LEFT   ABI  0.45  0.56     Dg Chest 1 View 03/03/2014   CLINICAL DATA:  Cough, ex-smoker  EXAM: CHEST - 1 VIEW  COMPARISON:  01/29/2014  FINDINGS: There is a moderate right pleural effusion with basilar atelectasis. There is no focal parenchymal opacity, left pleural effusion, or pneumothorax. There is stable cardiomegaly. There is thoracic aortic atherosclerosis.  The osseous structures are unremarkable.  IMPRESSION: Moderate right pleural effusion with basilar atelectasis.   Electronically Signed   By: Kathreen Devoid   On: 03/03/2014 13:39   Nm Myocar Multi W/spect W/wall Motion / Ef 03/09/2014   CLINICAL DATA:  Chest pain  EXAM: MYOCARDIAL IMAGING WITH SPECT (REST AND PHARMACOLOGIC-STRESS)  GATED LEFT VENTRICULAR WALL MOTION STUDY  LEFT VENTRICULAR EJECTION FRACTION  TECHNIQUE: Standard myocardial SPECT imaging was performed after resting intravenous injection of 10 mCi Tc-5m sestamibi. Subsequently, intravenous infusion of Lexiscan was performed under the supervision of the Cardiology staff. At peak effect of the drug, 30 mCi Tc-53m sestamibi was injected intravenously and standard myocardial SPECT imaging was performed. Quantitative gated imaging was also performed to evaluate left ventricular wall motion, and estimate left ventricular ejection fraction.  COMPARISON:  None.  FINDINGS: Limited exam with inferior wall attenuation artifact and motion artifact. Allowing for this, no significant inducible or reversible ischemia with pharmacologic stress.  Wall motion: Global hypokinesis, most pronounced in the inferior wall  Ejection fraction: Calculated ejection fraction is estimated at 42%.  IMPRESSION: Limited with motion artifact. No significant inducible ischemia with pharmacologic stress.    Electronically Signed   By: Daryll Brod M.D.   On: 03/09/2014 13:25    Discharge Medications   Current Discharge Medication List    START taking these medications   Details  atorvastatin (LIPITOR) 20 MG tablet Take 1 tablet (20 mg total) by mouth every evening.    carvedilol (COREG) 6.25 MG tablet Take 1 tablet (6.25 mg total) by mouth 2 (two) times daily with a meal.    isosorbide-hydrALAZINE (BIDIL) 20-37.5 MG per tablet Take 1 tablet by mouth 3 (three) times daily.      CONTINUE these medications which have CHANGED   Details  lisinopril (PRINIVIL,ZESTRIL) 5 MG tablet Take 1 tablet (5 mg total) by mouth 2 (two) times daily.  CONTINUE these medications which have NOT CHANGED   Details  acetaminophen (TYLENOL) 500 MG tablet Take 500 mg by mouth every 6 (six) hours as needed.    albuterol (PROVENTIL HFA;VENTOLIN HFA) 108 (90 BASE) MCG/ACT inhaler Inhale 2 puffs into the lungs every 4 (four) hours as needed for wheezing or shortness of breath.     albuterol (PROVENTIL) (2.5 MG/3ML) 0.083% nebulizer solution Take 2.5 mg by nebulization See admin instructions. Use 1 neb three times a day AND use 1 neb every 12 hours as needed for shortness of breath    Amino Acids-Protein Hydrolys (FEEDING SUPPLEMENT, PRO-STAT SUGAR FREE 64,) LIQD Take 30 mLs by mouth 3 (three) times daily with meals.    aspirin EC 81 MG tablet Take 81 mg by mouth every evening.     fenofibrate 160 MG tablet Take 160 mg by mouth every evening.     torsemide (DEMADEX) 20 MG tablet Take 2 tablets (40 mg total) by mouth 2 (two) times daily.    traMADol (ULTRAM) 50 MG tablet Take 50-100 mg by mouth every 4 (four) hours as needed for moderate pain.     TRAVATAN Z 0.004 % SOLN ophthalmic solution Place 1 drop into the left eye at bedtime.     traZODone (DESYREL) 50 MG tablet Take 50 mg by mouth at bedtime.      STOP taking these medications     amLODipine (NORVASC) 10 MG tablet      metoprolol succinate  (TOPROL-XL) 25 MG 24 hr tablet         Disposition   The patient will be discharged in stable condition to home. Discharge Instructions   Diet - low sodium heart healthy    Complete by:  As directed      Increase activity slowly    Complete by:  As directed           Follow-up Information   Follow up with [facility] On 03/18/2014. (@ 11:45 pm. Heart Failure Clinic is located in the Hospital in the Heart and Vascular Building; Please bring all your medications to your visit. Gate Code [phone])    Specialty:  Cardiology   Contact information:   [address] [id number] [place] [address] [phone]      Follow up with Dr. Gwenlyn Found or Dr. Fletcher Anon . (Office will call you for your appointment with the vascular doctor. Call office if you have not heard back in 3 days.)    Contact information:   [place] [facility] [place] - [phone]        Duration of Discharge Encounter: Greater than 30 minutes including physician and PA time.  Signed, Melina Copa PA-C 03/12/2014, 10:54 AM

## 2014-03-12 NOTE — Discharge Planning (Addendum)
Discharge to: [facility] Transportation: [phone] Report to be called by RN to: [phone]  **family will need to complete re-admission paperwork for SNF PRIOR to pt being dc'd.  CSW is awaiting confirmation that family has completed paperwork for pt to be transported/admitted to facility**  12:47pm- DC summary has been electronically sent to [facility].  RN aware.  Nonnie Done, [place] [phone]  Clinical Social Work

## 2014-03-14 ENCOUNTER — Emergency Department (HOSPITAL_COMMUNITY)
Admission: EM | Admit: 2014-03-14 | Discharge: 2014-03-15 | Disposition: A | Discharge status: Home / Self Care | Payer: PRIVATE HEALTH INSURANCE | Attending: Emergency Medicine | Admitting: Emergency Medicine

## 2014-03-14 ENCOUNTER — Encounter (HOSPITAL_COMMUNITY): Payer: Self-pay | Admitting: Emergency Medicine

## 2014-03-14 DIAGNOSIS — IMO0002 Reserved for concepts with insufficient information to code with codable children: Secondary | ICD-10-CM | POA: Insufficient documentation

## 2014-03-14 DIAGNOSIS — F0391 Unspecified dementia with behavioral disturbance: Secondary | ICD-10-CM

## 2014-03-14 DIAGNOSIS — N183 Chronic kidney disease, stage 3 unspecified: Secondary | ICD-10-CM | POA: Diagnosis not present

## 2014-03-14 DIAGNOSIS — F0390 Unspecified dementia without behavioral disturbance: Secondary | ICD-10-CM | POA: Insufficient documentation

## 2014-03-14 DIAGNOSIS — E119 Type 2 diabetes mellitus without complications: Secondary | ICD-10-CM | POA: Insufficient documentation

## 2014-03-14 DIAGNOSIS — Y9289 Other specified places as the place of occurrence of the external cause: Secondary | ICD-10-CM | POA: Diagnosis not present

## 2014-03-14 DIAGNOSIS — M129 Arthropathy, unspecified: Secondary | ICD-10-CM | POA: Insufficient documentation

## 2014-03-14 DIAGNOSIS — Z87891 Personal history of nicotine dependence: Secondary | ICD-10-CM | POA: Insufficient documentation

## 2014-03-14 DIAGNOSIS — E785 Hyperlipidemia, unspecified: Secondary | ICD-10-CM | POA: Insufficient documentation

## 2014-03-14 DIAGNOSIS — J4489 Other specified chronic obstructive pulmonary disease: Secondary | ICD-10-CM | POA: Insufficient documentation

## 2014-03-14 DIAGNOSIS — I5032 Chronic diastolic (congestive) heart failure: Secondary | ICD-10-CM | POA: Diagnosis not present

## 2014-03-14 DIAGNOSIS — I129 Hypertensive chronic kidney disease with stage 1 through stage 4 chronic kidney disease, or unspecified chronic kidney disease: Secondary | ICD-10-CM | POA: Insufficient documentation

## 2014-03-14 DIAGNOSIS — Z79899 Other long term (current) drug therapy: Secondary | ICD-10-CM | POA: Insufficient documentation

## 2014-03-14 DIAGNOSIS — M546 Pain in thoracic spine: Secondary | ICD-10-CM

## 2014-03-14 DIAGNOSIS — W138XXA Fall from, out of or through other building or structure, initial encounter: Secondary | ICD-10-CM | POA: Diagnosis not present

## 2014-03-14 DIAGNOSIS — Y9302 Activity, running: Secondary | ICD-10-CM | POA: Diagnosis not present

## 2014-03-14 DIAGNOSIS — Z7982 Long term (current) use of aspirin: Secondary | ICD-10-CM | POA: Insufficient documentation

## 2014-03-14 DIAGNOSIS — J449 Chronic obstructive pulmonary disease, unspecified: Secondary | ICD-10-CM | POA: Insufficient documentation

## 2014-03-14 NOTE — ED Notes (Signed)
Bed: WA10 Expected date: 03/14/14 Expected time: 10:55 PM Means of arrival: Ambulance Comments: 80 yo M  Back pain
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
## 2014-03-15 DIAGNOSIS — IMO0002 Reserved for concepts with insufficient information to code with codable children: Secondary | ICD-10-CM | POA: Diagnosis not present

## 2014-03-15 LAB — URINALYSIS, ROUTINE W REFLEX MICROSCOPIC
Bilirubin Urine: NEGATIVE
Glucose, UA: NEGATIVE mg/dL
Ketones, ur: NEGATIVE mg/dL
Leukocytes, UA: NEGATIVE
Nitrite: NEGATIVE
Protein, ur: NEGATIVE mg/dL
Specific Gravity, Urine: 1.012 (ref 1.005–1.030)
Urobilinogen, UA: 2 mg/dL — ABNORMAL HIGH (ref 0.0–1.0)
pH: 5.5 (ref 5.0–8.0)

## 2014-03-15 LAB — URINE MICROSCOPIC-ADD ON

## 2014-03-15 NOTE — ED Notes (Signed)
[facility] called for transport.  

## 2014-03-15 NOTE — ED Notes (Signed)
Nursing facility mentioned a raised area to patient's back under the skin. Upon assessment of the right flank patient has a fatty mass to his back that moves when palpated.

## 2014-03-15 NOTE — ED Notes (Signed)
Attempt made to collect urine, patient could not tolerated procedure. Will give patient a moment and reassess.

## 2014-03-17 NOTE — Progress Notes (Signed)
Patient ID: [name] [name], male   DOB: 1930/12/11, 80 y.o.   MRN: [id number]  HPI: PCP: Dr. Sarita Haver  Referring MD: [name]   80 yo with history of possible interstitial lung disease, metapneumovirus PNA in 5/15 with intubation, CKD, ?dementia, and CHF (primarily diastolic) presents for cardiology evaluation. He was referred by [name] who has been following him for interstitial lung disease. PFTs in 1/15 showed restrictive spirometry/lung volumes. Full ILD workup has not been completed. Most recent echo in 5/15 showed mild global hypokinesis, EF 45% with mildly dilated and dysfunctional RV and moderate pulmonary hypertension. Patient has chronic right foot ulcers with mildly depressed ABIs. He had an admission for CHF in 3/15, and as above, he had an admission with respiratory failure and metapneumovirus PNA in 5/15.   Readmitted to [facility] July 23 with massive volume overload. Diuresed with Milrinone and Lasix drip. He underwent Lexiscan nuclear stress test to evaluate for ischemic component of cardiomyopathy which showed EF 43% with no ischemia, inferior hypokinesis with "inferior attenuation." Dr. Aundra Dubin suspected probable prior inferior MI. Given CKD and no reversible ischemia, Dr. Aundra Dubin recommended to continue ASA and statin and avoid LHC at this point. Overall he diuresed 31 pounds. Discharge weight was 181 pounds. He was discharged back to [facility].  He returns for post hospital follow up. He was evaluated in [facility] ED on July 31 for back pain. Currently at [facility]. Difficult to know how he is doing due to his dementia. Denies SOB/PND/Orthopnea. Remains [facility]. Appetite ok. No weights provided by [facility].   03/06/14 RHC  RA mean 27  RV 68/27  PA 63/27, mean 46  PCWP mean 36  Oxygen saturations:  PA 45%  AO 91%  CO/CI  3.77 /1.71  PVR 2.6 WU   ECG (5/15): NSR, LVH with repolarization abnormalities   Labs (5/15): K 3.5, creatinine 1.35, HCT 42.3   Labs 03/12/14: K 4.0 Creatinine 1.41   PMH:  1. Possible interstitial lung disease: Followed by [name]. PFTs (1/15) with FVC 53%, FEV1 46%, ratio 85%, TLC 62%, DLCO 45% (restrictive defect).  2. Metapneumovirus PNA in 5/15 with acute respiratory failure and intubation. Bilateral transudative pleural effusions.  3. CKD: Followed by Dr. [name]  4. Type II diabetes: Currently diet-controlled.  5. ?Dementia: Caregiver concerned this is present, apparently no formal diagnosis.  6. HTN  7. Primarily diastolic CHF: Echo (3/55) with EF 45%, mild global hypokinesis, mild MR, mild to moderate LAE, mild MR, RV mildly dilated with mildly decreased systolic function, PA systolic pressure 59 mmHg.  8. PAD: 3/15 ABIs 0.7 on right, 0.8 on left. Nonhealing ulcers on right foot.  SH: Lives a [facility] currently, has son, comes in with caregiver. Prior smoker. No ETOH.  FH: "heart disease"       ROS: All systems negative except as listed in HPI, PMH and Problem List.  Past Medical History  Diagnosis Date  . Hypertension   . Diabetes mellitus     a. Diet-controlled.  . Arthritis   . Chronic diastolic CHF (congestive heart failure)     a. Primarily diastolic CHF & RV dysfunction but systolic dysfunction. 2D Echo 03/07/14 showed EF 40-45%, diffuse HK, grade 1 diastolic dysfunction, mild MR/AI, mod dilated LA/RA, mildly dilated RV, PA pressure 53mHg. Lexiscan nuc 02/2014: EF 43% with no ischemia, inferior hypokinesis with "inferior attenuation." Dr. Aundra Dubin suspected probable prior inferior MI.  Marland Kitchen COPD (chronic obstructive pulmonary disease)   . Hyperlipidemia   . CKD (chronic  kidney disease), stage III     renal insuficiency  . Inferior MI     a. Based on nuc results 02/2014, Dr. Aundra Dubin suspected probable prior inferior MI (EF 43% with no ischemia, inferior hypokinesis with "inferior attenuation").  . Dementia   . PAD (peripheral artery disease)     a. ABI 02/2014: R 0.45, L 0.56. R foot  nonhealing ulcers.  . Mild mitral regurgitation   . Mild AI (aortic insufficiency)   . Lung disease     a. Possible interstitial lung disease: Followed by [name]. PFTs (1/15) with FVC 53%, FEV1 46%, ratio 85%, TLC 62%, DLCO 45% (restrictive defect).  . Human metapneumovirus pneumonia     a. Metapneumovirus PNA in 5/15 with acute respiratory failure and intubation. Bilateral transudative pleural effusions.    Current Outpatient Prescriptions  Medication Sig Dispense Refill  . acetaminophen (TYLENOL) 500 MG tablet Take 500 mg by mouth every 6 (six) hours as needed for moderate pain.       Marland Kitchen albuterol (PROVENTIL) (2.5 MG/3ML) 0.083% nebulizer solution Take 2.5 mg by nebulization See admin instructions. Use 1 neb three times a day AND use 1 neb every 12 hours as needed for shortness of breath      . Amino Acids-Protein Hydrolys (FEEDING SUPPLEMENT, PRO-STAT SUGAR FREE 64,) LIQD Take 30 mLs by mouth 3 (three) times daily with meals.      Marland Kitchen aspirin EC 81 MG tablet Take 81 mg by mouth every evening.       Marland Kitchen atorvastatin (LIPITOR) 20 MG tablet Take 1 tablet (20 mg total) by mouth every evening.      . carvedilol (COREG) 6.25 MG tablet Take 1 tablet (6.25 mg total) by mouth 2 (two) times daily with a meal.      . fenofibrate 160 MG tablet Take 160 mg by mouth every evening.       . isosorbide-hydrALAZINE (BIDIL) 20-37.5 MG per tablet Take 1 tablet by mouth 3 (three) times daily.      Marland Kitchen lisinopril (PRINIVIL,ZESTRIL) 5 MG tablet Take 1 tablet (5 mg total) by mouth 2 (two) times daily.      Marland Kitchen torsemide (DEMADEX) 20 MG tablet Take 2 tablets (40 mg total) by mouth 2 (two) times daily.      . traMADol (ULTRAM) 50 MG tablet Take 50-100 mg by mouth every 4 (four) hours as needed for moderate pain.       . TRAVATAN Z 0.004 % SOLN ophthalmic solution Place 1 drop into the left eye at bedtime.       . traZODone (DESYREL) 50 MG tablet Take 50 mg by mouth at bedtime.      Marland Kitchen albuterol (PROVENTIL HFA;VENTOLIN  HFA) 108 (90 BASE) MCG/ACT inhaler Inhale 2 puffs into the lungs every 4 (four) hours as needed for wheezing or shortness of breath.        No current facility-administered medications for this encounter.     PHYSICAL EXAM: Filed Vitals:   03/18/14 1152  BP: 110/41  Pulse: 64  Weight: 188 lb 3.2 oz (85.367 kg)  SpO2: 97%    General:  Chronically ill  appearing. No resp difficulty [facility] to [facility] in a wheelchair.  HEENT: normal Neck: supple. JVP 6-7. Carotids 2+ bilaterally; no bruits. No lymphadenopathy or thryomegaly appreciated. Cor: PMI normal. Regular rate & rhythm. No rubs, gallops or murmurs. Lungs: clear Abdomen: soft, nontender, nondistended. No hepatosplenomegaly. No bruits or masses. Good bowel sounds. Extremities: no cyanosis, clubbing, rash, edema  Neuro: alert & orientedx3, cranial nerves grossly intact. Moves all 4 extremities w/o difficulty. Affect pleasant.      ASSESSMENT & PLAN: 1. Chronic Diastolic CHF: EF 38% with diffuse hypokinesis on last echo. Moderate pulmonary hypertension, suspect pulmonary venous hypertension in the setting of elevated left atrial pressure. Lexiscan 03/09/14 with no ischemia noted.  Volume status stable. Continue  torsemide to 40 mg bid. Instructed to take an additional 20 mg of torsemide for 3 pound weight gain in 24 hours.   Provided SNF with orders for daily weights, low salt diet, and limiting fluid intake to < 2 liters per day.   2. Interstitial lung disease: Restrictive PFTs. He has not completed a full workup of this yet. Per [name].  3. PAD: ABIs only mildly decreased though he has right foot ulcerations. Continue ASA 81 mg daily and  atorvastatin 20 mg daily. Check  lipids/LFTs in 2 months.   Follow up in 1 month  Darrik Richman NP-C  12:36 PM

## 2014-03-17 NOTE — ED Provider Notes (Signed)
CSN: [id number]     Arrival date & time 03/14/14  2309 History   First MD Initiated Contact with Patient 03/15/14 0001     Chief Complaint  Patient presents with  . Back Pain  . Abdominal Pain     (Consider location/radiation/quality/duration/timing/severity/associated sxs/prior Treatment) HPI  80 year old male with apparent back pain? She currently tells me this is better though. Patient is demented. He tells me he sustained these injuries after jumping between rooftops while chasing someone and fell three stories. He also tells me that the wounds on his feet/lower extremities are result of this fall as well. They are not. They are more chronic in nature. Patient is pleasant, but clearly confabulating. He has a history dementia. No report of acute trauma by EMS. The patient appears to be at his baseline mental status. Unclear the exact timing/chronicity of this pain.  Past Medical History  Diagnosis Date  . Hypertension   . Diabetes mellitus     a. Diet-controlled.  . Arthritis   . Chronic diastolic CHF (congestive heart failure)     a. Primarily diastolic CHF & RV dysfunction but systolic dysfunction. 2D Echo 03/07/14 showed EF 40-45%, diffuse HK, grade 1 diastolic dysfunction, mild MR/AI, mod dilated LA/RA, mildly dilated RV, PA pressure 10mHg. Lexiscan nuc 02/2014: EF 43% with no ischemia, inferior hypokinesis with "inferior attenuation." Dr. Aundra Dubin suspected probable prior inferior MI.  Marland Kitchen COPD (chronic obstructive pulmonary disease)   . Hyperlipidemia   . CKD (chronic kidney disease), stage III     renal insuficiency  . Inferior MI     a. Based on nuc results 02/2014, Dr. Aundra Dubin suspected probable prior inferior MI (EF 43% with no ischemia, inferior hypokinesis with "inferior attenuation").  . Dementia   . PAD (peripheral artery disease)     a. ABI 02/2014: R 0.45, L 0.56. R foot nonhealing ulcers.  . Mild mitral regurgitation   . Mild AI (aortic insufficiency)   . Lung disease      a. Possible interstitial lung disease: Followed by [name]. PFTs (1/15) with FVC 53%, FEV1 46%, ratio 85%, TLC 62%, DLCO 45% (restrictive defect).  . Human metapneumovirus pneumonia     a. Metapneumovirus PNA in 5/15 with acute respiratory failure and intubation. Bilateral transudative pleural effusions.   Past Surgical History  Procedure Laterality Date  . Leg surgery      both legs - unsure of exact operations - from trauma  . Tonsillectomy    . Appendectomy    . Cataract extraction w/phaco Left 09/04/2013    Procedure: CATARACT EXTRACTION PHACO AND INTRAOCULAR LENS PLACEMENT (IOC);  Surgeon: Adonis Brook, MD;  Location: [facility];  Service: Ophthalmology;  Laterality: Left;   Family History  Problem Relation Age of Onset  . Diabetes Father   . Diabetes Mother    History  Substance Use Topics  . Smoking status: Former Smoker -- 0.50 packs/day for 70 years    Types: Cigarettes    Quit date: 05/14/2009  . Smokeless tobacco: Never Used  . Alcohol Use: No    Review of Systems  All systems reviewed and negative, other than as noted in HPI.   Allergies  Review of patient's allergies indicates no known allergies.  Home Medications   Prior to Admission medications   Medication Sig Start Date End Date Taking? Authorizing Provider  acetaminophen (TYLENOL) 500 MG tablet Take 500 mg by mouth every 6 (six) hours as needed for moderate pain.    Yes Historical Provider,  MD  albuterol (PROVENTIL) (2.5 MG/3ML) 0.083% nebulizer solution Take 2.5 mg by nebulization See admin instructions. Use 1 neb three times a day AND use 1 neb every 12 hours as needed for shortness of breath   Yes Historical Provider, MD  Amino Acids-Protein Hydrolys (FEEDING SUPPLEMENT, PRO-STAT SUGAR FREE 64,) LIQD Take 30 mLs by mouth 3 (three) times daily with meals.   Yes Historical Provider, MD  aspirin EC 81 MG tablet Take 81 mg by mouth every evening.    Yes Historical Provider, MD  atorvastatin (LIPITOR) 20 MG  tablet Take 1 tablet (20 mg total) by mouth every evening. 03/12/14  Yes Dayna N Dunn, PA-C  carvedilol (COREG) 6.25 MG tablet Take 1 tablet (6.25 mg total) by mouth 2 (two) times daily with a meal. 03/12/14  Yes Dayna N Dunn, PA-C  fenofibrate 160 MG tablet Take 160 mg by mouth every evening.    Yes Historical Provider, MD  isosorbide-hydrALAZINE (BIDIL) 20-37.5 MG per tablet Take 1 tablet by mouth 3 (three) times daily. 03/12/14  Yes Dayna N Dunn, PA-C  lisinopril (PRINIVIL,ZESTRIL) 5 MG tablet Take 1 tablet (5 mg total) by mouth 2 (two) times daily. 03/12/14  Yes Dayna N Dunn, PA-C  torsemide (DEMADEX) 20 MG tablet Take 2 tablets (40 mg total) by mouth 2 (two) times daily. 02/25/14  Yes Larey Dresser, MD  traMADol (ULTRAM) 50 MG tablet Take 50-100 mg by mouth every 4 (four) hours as needed for moderate pain.    Yes Historical Provider, MD  TRAVATAN Z 0.004 % SOLN ophthalmic solution Place 1 drop into the left eye at bedtime.  07/04/13  Yes Historical Provider, MD  traZODone (DESYREL) 50 MG tablet Take 50 mg by mouth at bedtime.   Yes Historical Provider, MD  albuterol (PROVENTIL HFA;VENTOLIN HFA) 108 (90 BASE) MCG/ACT inhaler Inhale 2 puffs into the lungs every 4 (four) hours as needed for wheezing or shortness of breath.     Historical Provider, MD   BP 152/65  Pulse 83  Temp(Src) 98.6 F (37 C) (Oral)  Resp 18  SpO2 95% Physical Exam  Nursing note and vitals reviewed. Constitutional: He appears well-developed and well-nourished.  HENT:  Head: Normocephalic and atraumatic.  Eyes: Conjunctivae are normal. Right eye exhibits no discharge. Left eye exhibits no discharge.  Neck: Neck supple.  Cardiovascular: Normal rate, regular rhythm and normal heart sounds.  Exam reveals no gallop and no friction rub.   No murmur heard. Pulmonary/Chest: Effort normal and breath sounds normal. No respiratory distress.  Abdominal: Soft. He exhibits no distension. There is no tenderness.  Musculoskeletal: He  exhibits no edema and no tenderness.  Neurological: He is alert. No cranial nerve deficit.  Skin: Skin is warm and dry.  Subacute to chronic appearing wounds to the bilateral feet/lower extremities. Dressings in place. Patient is globally weak. He is able to actively range bilateral upper and lower extremities without much apparent difficulty. No apparent bony tenderness to palpation. He has no midline spinal tenderness. No acute appearing/concerning skin lesions.    ED Course  Procedures (including critical care time) Labs Review Labs Reviewed  URINALYSIS, ROUTINE W REFLEX MICROSCOPIC - Abnormal; Notable for the following:    Hgb urine dipstick TRACE (*)    Urobilinogen, UA 2.0 (*)    All other components within normal limits  URINE MICROSCOPIC-ADD ON    Imaging Review No results found.   EKG Interpretation None      MDM   Final diagnoses:  Thoracic back pain, unspecified back pain laterality  Dementia, with behavioral disturbance    80 year old male with apparent back pain? She currently tells me this is better though. Patient is demented. He tells me he sustained these injuries after jumping between rooftops while chasing someone and fell three stories. He also tells me that the wounds on his feet/lower extremities are result of this fall as well. They are not. They are more chronic in nature. Patient is pleasant, but clearly confabulating. His back does not seem tender to me at all. He has no concerning skin changes. He is moving his extremities. The best I can tell he is neurologically intact. Without a clear history of acute trauma or any objective findings I think that imaging or extensive workup is low utility and not clearly indicated. I felt it was reasonable to at least check a urinalysis which is unremarkable. I feel is appropriate for discharge back to his facility.  Virgel Manifold, MD 03/17/14 1245

## 2014-03-18 ENCOUNTER — Telehealth (HOSPITAL_COMMUNITY): Payer: Self-pay | Admitting: Vascular Surgery

## 2014-03-18 ENCOUNTER — Encounter (HOSPITAL_COMMUNITY): Payer: Self-pay

## 2014-03-18 ENCOUNTER — Ambulatory Visit (HOSPITAL_COMMUNITY)
Admit: 2014-03-18 | Discharge: 2014-03-18 | Disposition: A | Discharge status: Home / Self Care | Payer: PRIVATE HEALTH INSURANCE | Source: Ambulatory Visit | Attending: Internal Medicine | Admitting: Internal Medicine

## 2014-03-18 VITALS — BP 110/41 | HR 64 | Wt 188.2 lb

## 2014-03-18 DIAGNOSIS — J4489 Other specified chronic obstructive pulmonary disease: Secondary | ICD-10-CM | POA: Insufficient documentation

## 2014-03-18 DIAGNOSIS — I5032 Chronic diastolic (congestive) heart failure: Secondary | ICD-10-CM | POA: Diagnosis not present

## 2014-03-18 DIAGNOSIS — J8410 Pulmonary fibrosis, unspecified: Secondary | ICD-10-CM | POA: Diagnosis not present

## 2014-03-18 DIAGNOSIS — I129 Hypertensive chronic kidney disease with stage 1 through stage 4 chronic kidney disease, or unspecified chronic kidney disease: Secondary | ICD-10-CM | POA: Insufficient documentation

## 2014-03-18 DIAGNOSIS — N183 Chronic kidney disease, stage 3 unspecified: Secondary | ICD-10-CM | POA: Insufficient documentation

## 2014-03-18 DIAGNOSIS — I739 Peripheral vascular disease, unspecified: Secondary | ICD-10-CM | POA: Insufficient documentation

## 2014-03-18 DIAGNOSIS — E785 Hyperlipidemia, unspecified: Secondary | ICD-10-CM | POA: Diagnosis not present

## 2014-03-18 DIAGNOSIS — I252 Old myocardial infarction: Secondary | ICD-10-CM | POA: Diagnosis not present

## 2014-03-18 DIAGNOSIS — Z7982 Long term (current) use of aspirin: Secondary | ICD-10-CM | POA: Insufficient documentation

## 2014-03-18 DIAGNOSIS — J449 Chronic obstructive pulmonary disease, unspecified: Secondary | ICD-10-CM | POA: Diagnosis not present

## 2014-03-18 DIAGNOSIS — I509 Heart failure, unspecified: Secondary | ICD-10-CM

## 2014-03-18 DIAGNOSIS — M129 Arthropathy, unspecified: Secondary | ICD-10-CM | POA: Diagnosis not present

## 2014-03-18 NOTE — Telephone Encounter (Signed)
Nurse from [facility]... Need to clarify orders.. Please advise

## 2014-03-18 NOTE — Telephone Encounter (Signed)
Spoke w/Denise regarding instructions from today's visit

## 2014-03-21 ENCOUNTER — Telehealth: Payer: Self-pay | Admitting: Cardiovascular Disease

## 2014-03-21 ENCOUNTER — Encounter (HOSPITAL_COMMUNITY): Payer: Medicare Other

## 2014-03-21 NOTE — Telephone Encounter (Signed)
Closed encounter °

## 2014-03-25 ENCOUNTER — Ambulatory Visit (INDEPENDENT_AMBULATORY_CARE_PROVIDER_SITE_OTHER): Payer: Medicaid Other

## 2014-03-25 DIAGNOSIS — M79606 Pain in leg, unspecified: Secondary | ICD-10-CM

## 2014-03-25 DIAGNOSIS — E1142 Type 2 diabetes mellitus with diabetic polyneuropathy: Secondary | ICD-10-CM

## 2014-03-25 DIAGNOSIS — M79609 Pain in unspecified limb: Secondary | ICD-10-CM

## 2014-03-25 DIAGNOSIS — E1140 Type 2 diabetes mellitus with diabetic neuropathy, unspecified: Secondary | ICD-10-CM

## 2014-03-25 DIAGNOSIS — B351 Tinea unguium: Secondary | ICD-10-CM

## 2014-03-25 DIAGNOSIS — E1149 Type 2 diabetes mellitus with other diabetic neurological complication: Secondary | ICD-10-CM

## 2014-03-25 DIAGNOSIS — I739 Peripheral vascular disease, unspecified: Secondary | ICD-10-CM

## 2014-03-25 NOTE — Patient Instructions (Signed)
Diabetes and Foot Care Diabetes may cause you to have problems because of poor blood supply (circulation) to your feet and legs. This may cause the skin on your feet to become thinner, break easier, and heal more slowly. Your skin may become dry, and the skin may peel and crack. You may also have nerve damage in your legs and feet causing decreased feeling in them. You may not notice minor injuries to your feet that could lead to infections or more serious problems. Taking care of your feet is one of the most important things you can do for yourself.  HOME CARE INSTRUCTIONS  Wear shoes at all times, even in the house. Do not go barefoot. Bare feet are easily injured.  Check your feet daily for blisters, cuts, and redness. If you cannot see the bottom of your feet, use a mirror or ask someone for help.  Wash your feet with warm water (do not use hot water) and mild soap. Then pat your feet and the areas between your toes until they are completely dry. Do not soak your feet as this can dry your skin.  Apply a moisturizing lotion or petroleum jelly (that does not contain alcohol and is unscented) to the skin on your feet and to dry, brittle toenails. Do not apply lotion between your toes.  Trim your toenails straight across. Do not dig under them or around the cuticle. File the edges of your nails with an emery board or nail file.  Do not cut corns or calluses or try to remove them with medicine.  Wear clean socks or stockings every day. Make sure they are not too tight. Do not wear knee-high stockings since they may decrease blood flow to your legs.  Wear shoes that fit properly and have enough cushioning. To break in new shoes, wear them for just a few hours a day. This prevents you from injuring your feet. Always look in your shoes before you put them on to be sure there are no objects inside.  Do not cross your legs. This may decrease the blood flow to your feet.  If you find a minor scrape,  cut, or break in the skin on your feet, keep it and the skin around it clean and dry. These areas may be cleansed with mild soap and water. Do not cleanse the area with peroxide, alcohol, or iodine.  When you remove an adhesive bandage, be sure not to damage the skin around it.  If you have a wound, look at it several times a day to make sure it is healing.  Do not use heating pads or hot water bottles. They may burn your skin. If you have lost feeling in your feet or legs, you may not know it is happening until it is too late.  Make sure your health care provider performs a complete foot exam at least annually or more often if you have foot problems. Report any cuts, sores, or bruises to your health care provider immediately. SEEK MEDICAL CARE IF:   You have an injury that is not healing.  You have cuts or breaks in the skin.  You have an ingrown nail.  You notice redness on your legs or feet.  You feel burning or tingling in your legs or feet.  You have pain or cramps in your legs and feet.  Your legs or feet are numb.  Your feet always feel cold. SEEK IMMEDIATE MEDICAL CARE IF:   There is increasing redness,   swelling, or pain in or around a wound.  There is a red line that goes up your leg.  Pus is coming from a wound.  You develop a fever or as directed by your health care provider.  You notice a bad smell coming from an ulcer or wound. Document Released: 07/29/2000 Document Revised: 04/03/2013 Document Reviewed: 01/08/2013 Exit[employer] Patient Information 2015 [employer]. This information is not intended to replace advice given to you by your health care provider. Make sure you discuss any questions you have with your health care provider.  

## 2014-03-25 NOTE — Progress Notes (Signed)
   Subjective:    Patient ID: [name] [name], male    DOB: [birth year], 80 y.o.   MRN: [id number]  HPI Comments: Pt states he need the toenails trimmed and diabetic foot care.     Review of Systems no new findings or systemic changes noted    Objective:   Physical Exam 80-year-old African American male presents at this time for diabetic foot and nail care is nonambulatory in a wheelchair has multiple lesions on both legs and ankles hyperpigmented skin. Patient appears to be well oriented as trophic changes skin and nails both feet and Semmes signs of ulceration of both ankle areas. Multiple areas of dry eschar noted no active infection no ascending size lymphangitis noted  Lower extremity objective findings as follows pedal pulses thready nonpalpable DP and PT bilateral absent hair growth diminished skin texture and turgor mild +1 edema noted bilateral areas of ulceration the ankles injections consistent with NLD.  Nails thick brittle yellow discolored and friable 1 through 5 bilateral. There is areas of hyper pigmentation run the ankles and areas of hypopigmentation dorsum of the right foot with excoriation being noted. No active discharge or drainage noted multiple digital contractures identified bilateral feet assessment diabetes with complications peripheral neuropathy and angiopathy.   Assessment & Plan:   Painful mycotic brittle dystrophic nails are debrided 1 through 5 bilateral the presence of diabetes and complications. Patient wearing socks and slippers or sandals to keep his feet protected warm and well he ambulatory recommended followup in the next 3-4 months or an as-needed basis for continued palliative care in the future  Harriet Masson DPM

## 2014-04-01 ENCOUNTER — Other Ambulatory Visit (HOSPITAL_COMMUNITY): Payer: Self-pay | Admitting: Cardiology

## 2014-04-01 ENCOUNTER — Ambulatory Visit (INDEPENDENT_AMBULATORY_CARE_PROVIDER_SITE_OTHER): Payer: PRIVATE HEALTH INSURANCE | Admitting: Cardiovascular Disease

## 2014-04-01 ENCOUNTER — Encounter: Payer: Self-pay | Admitting: Cardiovascular Disease

## 2014-04-01 VITALS — BP 110/46 | HR 70 | Ht 69.0 in | Wt 181.2 lb

## 2014-04-01 DIAGNOSIS — I739 Peripheral vascular disease, unspecified: Secondary | ICD-10-CM

## 2014-04-01 NOTE — Progress Notes (Signed)
Primary cardiologist: Dr. Aundra Dubin  HPI  This is an 80 yo male who was referred for evaluation of peripheral arterial disease. He has chronic medical conditions that include interstitial lung disease, metapneumovirus PNA in 5/15 with intubation, CKD, ?dementia, and chronic systolic/diastolic heart failure   Patient lives in a nursing home currently.  He is very inactive, using a wheelchair to get around.  Much of history comes from his caregiver.  He was hospitalized on July 23 with massive volume overload. Diuresed with Milrinone and Lasix drip. He underwent Lexiscan nuclear stress test to evaluate for ischemic component of cardiomyopathy which showed EF 43% with no ischemia, inferior hypokinesis with "inferior attenuation." Dr. Aundra Dubin suspected probable prior inferior MI. Given CKD and no reversible ischemia, Dr. Aundra Dubin recommended to continue ASA and statin and avoid LHC at this point. Overall he diuresed 31 pounds.   ABI 7/28: Right: 0.45, Left 0.56 with dampened monophasic waveforms.  He reports superficial ulceration on dorsal side of both feet R>L which started more than 1 month ago and has been improving. He denies any pain at rest or with walking.    No Known Allergies   Current Outpatient Prescriptions on File Prior to Visit  Medication Sig Dispense Refill  . acetaminophen (TYLENOL) 500 MG tablet Take 500 mg by mouth every 6 (six) hours as needed for moderate pain.       Marland Kitchen albuterol (PROVENTIL HFA;VENTOLIN HFA) 108 (90 BASE) MCG/ACT inhaler Inhale 2 puffs into the lungs every 4 (four) hours as needed for wheezing or shortness of breath.       Marland Kitchen albuterol (PROVENTIL) (2.5 MG/3ML) 0.083% nebulizer solution Take 2.5 mg by nebulization See admin instructions. Use 1 neb three times a day AND use 1 neb every 12 hours as needed for shortness of breath      . Amino Acids-Protein Hydrolys (FEEDING SUPPLEMENT, PRO-STAT SUGAR FREE 64,) LIQD Take 30 mLs by mouth 3 (three) times daily with meals.       Marland Kitchen aspirin EC 81 MG tablet Take 81 mg by mouth every evening.       Marland Kitchen atorvastatin (LIPITOR) 20 MG tablet Take 1 tablet (20 mg total) by mouth every evening.      . carvedilol (COREG) 6.25 MG tablet Take 1 tablet (6.25 mg total) by mouth 2 (two) times daily with a meal.      . fenofibrate 160 MG tablet Take 160 mg by mouth every evening.       . isosorbide-hydrALAZINE (BIDIL) 20-37.5 MG per tablet Take 1 tablet by mouth 3 (three) times daily.      Marland Kitchen lisinopril (PRINIVIL,ZESTRIL) 5 MG tablet Take 1 tablet (5 mg total) by mouth 2 (two) times daily.      Marland Kitchen torsemide (DEMADEX) 20 MG tablet Take 2 tablets (40 mg total) by mouth 2 (two) times daily.      . traMADol (ULTRAM) 50 MG tablet Take 50-100 mg by mouth every 4 (four) hours as needed for moderate pain.       . TRAVATAN Z 0.004 % SOLN ophthalmic solution Place 1 drop into the left eye at bedtime.       . traZODone (DESYREL) 50 MG tablet Take 50 mg by mouth at bedtime.       No current facility-administered medications on file prior to visit.     Past Medical History  Diagnosis Date  . Hypertension   . Diabetes mellitus     a. Diet-controlled.  . Arthritis   .  Chronic diastolic CHF (congestive heart failure)     a. Primarily diastolic CHF & RV dysfunction but systolic dysfunction. 2D Echo 03/07/14 showed EF 40-45%, diffuse HK, grade 1 diastolic dysfunction, mild MR/AI, mod dilated LA/RA, mildly dilated RV, PA pressure 52mHg. Lexiscan nuc 02/2014: EF 43% with no ischemia, inferior hypokinesis with "inferior attenuation." Dr. Aundra Dubin suspected probable prior inferior MI.  Marland Kitchen COPD (chronic obstructive pulmonary disease)   . Hyperlipidemia   . CKD (chronic kidney disease), stage III     renal insuficiency  . Inferior MI     a. Based on nuc results 02/2014, Dr. Aundra Dubin suspected probable prior inferior MI (EF 43% with no ischemia, inferior hypokinesis with "inferior attenuation").  . Dementia   . PAD (peripheral artery disease)     a. ABI 02/2014:  R 0.45, L 0.56. R foot nonhealing ulcers.  . Mild mitral regurgitation   . Mild AI (aortic insufficiency)   . Lung disease     a. Possible interstitial lung disease: Followed by [name]. PFTs (1/15) with FVC 53%, FEV1 46%, ratio 85%, TLC 62%, DLCO 45% (restrictive defect).  . Human metapneumovirus pneumonia     a. Metapneumovirus PNA in 5/15 with acute respiratory failure and intubation. Bilateral transudative pleural effusions.     Past Surgical History  Procedure Laterality Date  . Leg surgery      both legs - unsure of exact operations - from trauma  . Tonsillectomy    . Appendectomy    . Cataract extraction w/phaco Left 09/04/2013    Procedure: CATARACT EXTRACTION PHACO AND INTRAOCULAR LENS PLACEMENT (IOC);  Surgeon: Adonis Brook, MD;  Location: [facility];  Service: Ophthalmology;  Laterality: Left;     Family History  Problem Relation Age of Onset  . Diabetes Father   . Diabetes Mother      History   Social History  . Marital Status: Single    Spouse Name: N/A    Number of Children: N/A  . Years of Education: N/A   Occupational History  . Not on file.   Social History Main Topics  . Smoking status: Former Smoker -- 0.50 packs/day for 70 years    Types: Cigarettes    Quit date: 05/14/2009  . Smokeless tobacco: Never Used  . Alcohol Use: No  . Drug Use: No     Comment: quit marijuana 3-4 years ago  . Sexual Activity: No   Other Topics Concern  . Not on file   Social History Narrative  . No narrative on file     ROS   PHYSICAL EXAM   BP 110/46  Pulse 70  Ht 5\' 9"  (1.753 m)  Wt 181 lb 3.2 oz (82.192 kg)  BMI 26.75 kg/m2  Constitutional: He is oriented to person, place, and time. He appears well-developed and well-nourished. No distress.  HENT: No nasal discharge.  Head: Normocephalic and atraumatic.  Eyes: Pupils are equal and round.  No discharge. Neck: Normal range of motion. Neck supple. No JVD present. No thyromegaly present.    Cardiovascular: Normal rate, regular rhythm, normal heart sounds. Exam reveals no gallop and no friction rub. No murmur heard.  Pulmonary/Chest: Effort normal and breath sounds normal. No stridor. No respiratory distress. He has no wheezes. He has no rales. He exhibits no tenderness.  Abdominal: Soft. Bowel sounds are normal. He exhibits no distension. There is no tenderness. There is no rebound and no guarding.  Musculoskeletal: Normal range of motion. He exhibits mild edema and no tenderness.  Neurological: He is alert and oriented to person, place, and time. Coordination normal.  Skin: Skin is warm and dry. Stasis dermatitis. He is not diaphoretic. No erythema. No pallor.  Psychiatric: He has a normal mood and affect. His behavior is normal. Judgment and thought content normal.  Vascular: femoral pulses : normal. Distal pulses: not palpable.  Superfacial ulceration on dorsal aspect of right food with skin depigmentation. At least 3-5 cm with no discharge or warmth. Small healed ulceration on medical side of ankle.      ASSESSMENT AND PLAN

## 2014-04-01 NOTE — Patient Instructions (Signed)
Your physician has requested that you have a lower extremity arterial duplex. This test is an ultrasound of the arteries in the legs It looks at arterial blood flow in the legs. Allow one hour for Lower Arterial scans. There are no restrictions or special instructions  Your physician recommends that you schedule a follow-up appointment in: 1 MONTH with Dr Fletcher Anon

## 2014-04-01 NOTE — Assessment & Plan Note (Signed)
The patient has evidence of peripheral arterial disease with significantly reduced ABI bilaterally. He has no claudication or rest pain. He does have ulceration involving the right lower extremity. However, the location and appearance of these ulcers make it less likely to be ischemic in etiology. The 2 ulcers on the medial aspect of ankle seem to be healed venous ulcers. Overall, the ulceration seems to be healing. Decreased perfusion could be contributing. I requested lower extremity arterial duplex for evaluation. I will have him followup with me in one month.

## 2014-04-04 ENCOUNTER — Ambulatory Visit (HOSPITAL_BASED_OUTPATIENT_CLINIC_OR_DEPARTMENT_OTHER): Payer: PRIVATE HEALTH INSURANCE | Admitting: Cardiology

## 2014-04-04 DIAGNOSIS — E1165 Type 2 diabetes mellitus with hyperglycemia: Secondary | ICD-10-CM | POA: Diagnosis not present

## 2014-04-04 DIAGNOSIS — I739 Peripheral vascular disease, unspecified: Secondary | ICD-10-CM

## 2014-04-04 DIAGNOSIS — E1159 Type 2 diabetes mellitus with other circulatory complications: Secondary | ICD-10-CM

## 2014-04-04 DIAGNOSIS — I70219 Atherosclerosis of native arteries of extremities with intermittent claudication, unspecified extremity: Secondary | ICD-10-CM

## 2014-04-04 DIAGNOSIS — L97519 Non-pressure chronic ulcer of other part of right foot with unspecified severity: Secondary | ICD-10-CM

## 2014-04-04 DIAGNOSIS — IMO0002 Reserved for concepts with insufficient information to code with codable children: Secondary | ICD-10-CM | POA: Diagnosis not present

## 2014-04-04 NOTE — Progress Notes (Signed)
LEA Doppler/ABI + duplex performed

## 2014-04-05 ENCOUNTER — Emergency Department (HOSPITAL_COMMUNITY): Payer: PRIVATE HEALTH INSURANCE

## 2014-04-05 ENCOUNTER — Encounter (HOSPITAL_COMMUNITY): Payer: Self-pay | Admitting: Emergency Medicine

## 2014-04-05 ENCOUNTER — Inpatient Hospital Stay (HOSPITAL_COMMUNITY)
Admission: EM | Admit: 2014-04-05 | Discharge: 2014-04-08 | DRG: 637 | Disposition: A | Discharge status: Skilled Nursing Facility | Payer: PRIVATE HEALTH INSURANCE | Attending: Internal Medicine | Admitting: Internal Medicine

## 2014-04-05 DIAGNOSIS — E876 Hypokalemia: Secondary | ICD-10-CM | POA: Diagnosis present

## 2014-04-05 DIAGNOSIS — I872 Venous insufficiency (chronic) (peripheral): Secondary | ICD-10-CM | POA: Diagnosis present

## 2014-04-05 DIAGNOSIS — E440 Moderate protein-calorie malnutrition: Secondary | ICD-10-CM | POA: Diagnosis present

## 2014-04-05 DIAGNOSIS — I5033 Acute on chronic diastolic (congestive) heart failure: Secondary | ICD-10-CM

## 2014-04-05 DIAGNOSIS — E871 Hypo-osmolality and hyponatremia: Secondary | ICD-10-CM

## 2014-04-05 DIAGNOSIS — Z794 Long term (current) use of insulin: Secondary | ICD-10-CM | POA: Diagnosis not present

## 2014-04-05 DIAGNOSIS — F0390 Unspecified dementia without behavioral disturbance: Secondary | ICD-10-CM

## 2014-04-05 DIAGNOSIS — L97409 Non-pressure chronic ulcer of unspecified heel and midfoot with unspecified severity: Secondary | ICD-10-CM | POA: Diagnosis present

## 2014-04-05 DIAGNOSIS — E1149 Type 2 diabetes mellitus with other diabetic neurological complication: Secondary | ICD-10-CM | POA: Diagnosis present

## 2014-04-05 DIAGNOSIS — J90 Pleural effusion, not elsewhere classified: Secondary | ICD-10-CM

## 2014-04-05 DIAGNOSIS — I359 Nonrheumatic aortic valve disorder, unspecified: Secondary | ICD-10-CM | POA: Diagnosis present

## 2014-04-05 DIAGNOSIS — L97519 Non-pressure chronic ulcer of other part of right foot with unspecified severity: Secondary | ICD-10-CM

## 2014-04-05 DIAGNOSIS — J123 Human metapneumovirus pneumonia: Secondary | ICD-10-CM

## 2014-04-05 DIAGNOSIS — Z7982 Long term (current) use of aspirin: Secondary | ICD-10-CM

## 2014-04-05 DIAGNOSIS — R0600 Dyspnea, unspecified: Secondary | ICD-10-CM

## 2014-04-05 DIAGNOSIS — I059 Rheumatic mitral valve disease, unspecified: Secondary | ICD-10-CM | POA: Diagnosis present

## 2014-04-05 DIAGNOSIS — J8410 Pulmonary fibrosis, unspecified: Secondary | ICD-10-CM

## 2014-04-05 DIAGNOSIS — E162 Hypoglycemia, unspecified: Secondary | ICD-10-CM

## 2014-04-05 DIAGNOSIS — E1169 Type 2 diabetes mellitus with other specified complication: Principal | ICD-10-CM

## 2014-04-05 DIAGNOSIS — Z6827 Body mass index (BMI) 27.0-27.9, adult: Secondary | ICD-10-CM

## 2014-04-05 DIAGNOSIS — IMO0002 Reserved for concepts with insufficient information to code with codable children: Principal | ICD-10-CM | POA: Diagnosis present

## 2014-04-05 DIAGNOSIS — I5043 Acute on chronic combined systolic (congestive) and diastolic (congestive) heart failure: Secondary | ICD-10-CM | POA: Diagnosis present

## 2014-04-05 DIAGNOSIS — I129 Hypertensive chronic kidney disease with stage 1 through stage 4 chronic kidney disease, or unspecified chronic kidney disease: Secondary | ICD-10-CM | POA: Diagnosis present

## 2014-04-05 DIAGNOSIS — IMO0001 Reserved for inherently not codable concepts without codable children: Secondary | ICD-10-CM

## 2014-04-05 DIAGNOSIS — I509 Heart failure, unspecified: Secondary | ICD-10-CM | POA: Diagnosis present

## 2014-04-05 DIAGNOSIS — N179 Acute kidney failure, unspecified: Secondary | ICD-10-CM | POA: Diagnosis present

## 2014-04-05 DIAGNOSIS — M129 Arthropathy, unspecified: Secondary | ICD-10-CM | POA: Diagnosis present

## 2014-04-05 DIAGNOSIS — E1129 Type 2 diabetes mellitus with other diabetic kidney complication: Secondary | ICD-10-CM | POA: Diagnosis present

## 2014-04-05 DIAGNOSIS — E860 Dehydration: Secondary | ICD-10-CM

## 2014-04-05 DIAGNOSIS — E1165 Type 2 diabetes mellitus with hyperglycemia: Secondary | ICD-10-CM | POA: Diagnosis present

## 2014-04-05 DIAGNOSIS — R627 Adult failure to thrive: Secondary | ICD-10-CM | POA: Diagnosis present

## 2014-04-05 DIAGNOSIS — N058 Unspecified nephritic syndrome with other morphologic changes: Secondary | ICD-10-CM | POA: Diagnosis present

## 2014-04-05 DIAGNOSIS — N183 Chronic kidney disease, stage 3 unspecified: Secondary | ICD-10-CM

## 2014-04-05 DIAGNOSIS — J9620 Acute and chronic respiratory failure, unspecified whether with hypoxia or hypercapnia: Secondary | ICD-10-CM

## 2014-04-05 DIAGNOSIS — Z87891 Personal history of nicotine dependence: Secondary | ICD-10-CM

## 2014-04-05 DIAGNOSIS — Z66 Do not resuscitate: Secondary | ICD-10-CM | POA: Diagnosis present

## 2014-04-05 DIAGNOSIS — E1143 Type 2 diabetes mellitus with diabetic autonomic (poly)neuropathy: Secondary | ICD-10-CM

## 2014-04-05 DIAGNOSIS — D638 Anemia in other chronic diseases classified elsewhere: Secondary | ICD-10-CM | POA: Diagnosis present

## 2014-04-05 DIAGNOSIS — E785 Hyperlipidemia, unspecified: Secondary | ICD-10-CM | POA: Diagnosis present

## 2014-04-05 DIAGNOSIS — J984 Other disorders of lung: Secondary | ICD-10-CM

## 2014-04-05 DIAGNOSIS — I340 Nonrheumatic mitral (valve) insufficiency: Secondary | ICD-10-CM

## 2014-04-05 DIAGNOSIS — E1142 Type 2 diabetes mellitus with diabetic polyneuropathy: Secondary | ICD-10-CM | POA: Diagnosis present

## 2014-04-05 DIAGNOSIS — I351 Nonrheumatic aortic (valve) insufficiency: Secondary | ICD-10-CM

## 2014-04-05 DIAGNOSIS — I739 Peripheral vascular disease, unspecified: Secondary | ICD-10-CM

## 2014-04-05 DIAGNOSIS — I10 Essential (primary) hypertension: Secondary | ICD-10-CM

## 2014-04-05 DIAGNOSIS — R911 Solitary pulmonary nodule: Secondary | ICD-10-CM

## 2014-04-05 DIAGNOSIS — I5032 Chronic diastolic (congestive) heart failure: Secondary | ICD-10-CM

## 2014-04-05 LAB — CBC
HCT: 30.3 % — ABNORMAL LOW (ref 39.0–52.0)
Hemoglobin: 10.4 g/dL — ABNORMAL LOW (ref 13.0–17.0)
MCH: 32.6 pg (ref 26.0–34.0)
MCHC: 34.3 g/dL (ref 30.0–36.0)
MCV: 95 fL (ref 78.0–100.0)
PLATELETS: 263 10*3/uL (ref 150–400)
RBC: 3.19 MIL/uL — ABNORMAL LOW (ref 4.22–5.81)
RDW: 13.5 % (ref 11.5–15.5)
WBC: 10.6 10*3/uL — AB (ref 4.0–10.5)

## 2014-04-05 LAB — URINALYSIS, ROUTINE W REFLEX MICROSCOPIC
Bilirubin Urine: NEGATIVE
Glucose, UA: NEGATIVE mg/dL
HGB URINE DIPSTICK: NEGATIVE
Ketones, ur: NEGATIVE mg/dL
LEUKOCYTES UA: NEGATIVE
Nitrite: NEGATIVE
Protein, ur: NEGATIVE mg/dL
SPECIFIC GRAVITY, URINE: 1.01 (ref 1.005–1.030)
UROBILINOGEN UA: 1 mg/dL (ref 0.0–1.0)
pH: 5.5 (ref 5.0–8.0)

## 2014-04-05 LAB — I-STAT CHEM 8, ED
BUN: 49 mg/dL — ABNORMAL HIGH (ref 6–23)
CREATININE: 1.6 mg/dL — AB (ref 0.50–1.35)
Calcium, Ion: 1.11 mmol/L — ABNORMAL LOW (ref 1.13–1.30)
Chloride: 96 mEq/L (ref 96–112)
Glucose, Bld: 102 mg/dL — ABNORMAL HIGH (ref 70–99)
HCT: 35 % — ABNORMAL LOW (ref 39.0–52.0)
HEMOGLOBIN: 11.9 g/dL — AB (ref 13.0–17.0)
POTASSIUM: 3.5 meq/L — AB (ref 3.7–5.3)
SODIUM: 138 meq/L (ref 137–147)
TCO2: 31 mmol/L (ref 0–100)

## 2014-04-05 LAB — COMPREHENSIVE METABOLIC PANEL
ALT: 27 U/L (ref 0–53)
AST: 58 U/L — AB (ref 0–37)
Albumin: 3.2 g/dL — ABNORMAL LOW (ref 3.5–5.2)
Alkaline Phosphatase: 74 U/L (ref 39–117)
Anion gap: 14 (ref 5–15)
BUN: 36 mg/dL — ABNORMAL HIGH (ref 6–23)
CALCIUM: 8.7 mg/dL (ref 8.4–10.5)
CO2: 25 mEq/L (ref 19–32)
Chloride: 99 mEq/L (ref 96–112)
Creatinine, Ser: 1.43 mg/dL — ABNORMAL HIGH (ref 0.50–1.35)
GFR calc non Af Amer: 44 mL/min — ABNORMAL LOW (ref >90)
GFR, EST AFRICAN AMERICAN: 51 mL/min — AB (ref >90)
Glucose, Bld: 104 mg/dL — ABNORMAL HIGH (ref 70–99)
Potassium: 3.3 mEq/L — ABNORMAL LOW (ref 3.7–5.3)
SODIUM: 138 meq/L (ref 137–147)
TOTAL PROTEIN: 7.4 g/dL (ref 6.0–8.3)
Total Bilirubin: 1 mg/dL (ref 0.3–1.2)

## 2014-04-05 LAB — CBG MONITORING, ED
GLUCOSE-CAPILLARY: 32 mg/dL — AB (ref 70–99)
Glucose-Capillary: 66 mg/dL — ABNORMAL LOW (ref 70–99)
Glucose-Capillary: 72 mg/dL (ref 70–99)
Glucose-Capillary: 94 mg/dL (ref 70–99)
Glucose-Capillary: <10 mg/dL — CL (ref 70–99)

## 2014-04-05 LAB — I-STAT TROPONIN, ED: Troponin i, poc: 0.02 ng/mL (ref 0.00–0.08)

## 2014-04-05 MED ORDER — DEXTROSE 50 % IV SOLN
INTRAVENOUS | Status: AC
  Administered 2014-04-05: 50 mL
  Filled 2014-04-05: qty 50

## 2014-04-05 MED ORDER — SODIUM CHLORIDE 4 MEQ/ML IV SOLN
INTRAVENOUS | Status: DC
  Filled 2014-04-05: qty 1000

## 2014-04-05 MED ORDER — DEXTROSE 10 % IV SOLN
INTRAVENOUS | Status: DC
Start: 2014-04-05 — End: 2014-04-06
  Administered 2014-04-05: 50 mL/h via INTRAVENOUS
  Administered 2014-04-06: 12:00:00 via INTRAVENOUS
  Filled 2014-04-05 (×2): qty 1000

## 2014-04-05 MED ORDER — SODIUM CHLORIDE 0.9 % IV BOLUS (SEPSIS)
500.0000 mL | Freq: Once | INTRAVENOUS | Status: AC
  Administered 2014-04-05: 500 mL via INTRAVENOUS

## 2014-04-05 MED ORDER — DEXTROSE-NACL 5-0.9 % IV SOLN
Freq: Once | INTRAVENOUS | Status: AC
  Administered 2014-04-05: 21:00:00 via INTRAVENOUS

## 2014-04-05 MED ORDER — DEXTROSE 50 % IV SOLN
50.0000 mL | Freq: Once | INTRAVENOUS | Status: AC
  Administered 2014-04-05: 50 mL via INTRAVENOUS
  Filled 2014-04-05: qty 50

## 2014-04-05 MED ORDER — DEXTROSE 10 % IV BOLUS: 500.0000 mL | Freq: Once | INTRAVENOUS | Status: DC

## 2014-04-05 MED ORDER — DEXTROSE-NACL 5-0.9 % IV SOLN: Freq: Once | INTRAVENOUS | Status: DC

## 2014-04-05 NOTE — ED Notes (Signed)
EKG given to [place] for review

## 2014-04-05 NOTE — ED Notes (Signed)
Bed: IO97 Expected date: 04/05/14 Expected time: 7:28 PM Means of arrival: Ambulance Comments: hypoglycemia

## 2014-04-05 NOTE — ED Provider Notes (Addendum)
CSN: [id number]     Arrival date & time 04/05/14  1946 History   First MD Initiated Contact with Patient 04/05/14 1951     Chief Complaint  Patient presents with  . Hypoglycemia     (Consider location/radiation/quality/duration/timing/severity/associated sxs/prior Treatment) Patient is a 80 y.o. male presenting with hypoglycemia. The history is provided by the patient.  Hypoglycemia Initial blood sugar:  30s Blood sugar after intervention:  60 Severity:  Severe Onset quality:  Sudden Timing:  Constant Progression:  Waxing and waning Chronicity:  New Diabetic status:  Non-diabetic Current diabetic therapy:  Levemir and SSI Context: not diet changes and not exercise   Relieved by:  Nothing Ineffective treatments:  Glucagon (3 units of glucagon) Associated symptoms: no shortness of breath and no vomiting     Past Medical History  Diagnosis Date  . Hypertension   . Diabetes mellitus     a. Diet-controlled.  . Arthritis   . Chronic diastolic CHF (congestive heart failure)     a. Primarily diastolic CHF & RV dysfunction but systolic dysfunction. 2D Echo 03/07/14 showed EF 40-45%, diffuse HK, grade 1 diastolic dysfunction, mild MR/AI, mod dilated LA/RA, mildly dilated RV, PA pressure 24mHg. Lexiscan nuc 02/2014: EF 43% with no ischemia, inferior hypokinesis with "inferior attenuation." Dr. Aundra Dubin suspected probable prior inferior MI.  Marland Kitchen COPD (chronic obstructive pulmonary disease)   . Hyperlipidemia   . CKD (chronic kidney disease), stage III     renal insuficiency  . Inferior MI     a. Based on nuc results 02/2014, Dr. Aundra Dubin suspected probable prior inferior MI (EF 43% with no ischemia, inferior hypokinesis with "inferior attenuation").  . Dementia   . PAD (peripheral artery disease)     a. ABI 02/2014: R 0.45, L 0.56. R foot nonhealing ulcers.  . Mild mitral regurgitation   . Mild AI (aortic insufficiency)   . Lung disease     a. Possible interstitial lung disease: Followed by  [name]. PFTs (1/15) with FVC 53%, FEV1 46%, ratio 85%, TLC 62%, DLCO 45% (restrictive defect).  . Human metapneumovirus pneumonia     a. Metapneumovirus PNA in 5/15 with acute respiratory failure and intubation. Bilateral transudative pleural effusions.   Past Surgical History  Procedure Laterality Date  . Leg surgery      both legs - unsure of exact operations - from trauma  . Tonsillectomy    . Appendectomy    . Cataract extraction w/phaco Left 09/04/2013    Procedure: CATARACT EXTRACTION PHACO AND INTRAOCULAR LENS PLACEMENT (IOC);  Surgeon: Adonis Brook, MD;  Location: [facility];  Service: Ophthalmology;  Laterality: Left;   Family History  Problem Relation Age of Onset  . Diabetes Father   . Diabetes Mother    History  Substance Use Topics  . Smoking status: Former Smoker -- 0.50 packs/day for 70 years    Types: Cigarettes    Quit date: 05/14/2009  . Smokeless tobacco: Never Used  . Alcohol Use: No    Review of Systems  Unable to perform ROS: Dementia  Constitutional: Negative for fever and chills.  Respiratory: Negative for cough and shortness of breath.   Cardiovascular: Negative for chest pain and leg swelling.  Gastrointestinal: Negative for vomiting and abdominal pain.  All other systems reviewed and are negative.     Allergies  Review of patient's allergies indicates no known allergies.  Home Medications   Prior to Admission medications   Medication Sig Start Date End Date Taking? Authorizing Provider  acetaminophen (TYLENOL) 500 MG tablet Take 500 mg by mouth every 6 (six) hours as needed for moderate pain.     Historical Provider, MD  albuterol (PROVENTIL HFA;VENTOLIN HFA) 108 (90 BASE) MCG/ACT inhaler Inhale 2 puffs into the lungs every 4 (four) hours as needed for wheezing or shortness of breath.     Historical Provider, MD  albuterol (PROVENTIL) (2.5 MG/3ML) 0.083% nebulizer solution Take 2.5 mg by nebulization See admin instructions. Use 1 neb three  times a day AND use 1 neb every 12 hours as needed for shortness of breath    Historical Provider, MD  Amino Acids-Protein Hydrolys (FEEDING SUPPLEMENT, PRO-STAT SUGAR FREE 64,) LIQD Take 30 mLs by mouth 3 (three) times daily with meals.    Historical Provider, MD  aspirin EC 81 MG tablet Take 81 mg by mouth every evening.     Historical Provider, MD  atorvastatin (LIPITOR) 20 MG tablet Take 1 tablet (20 mg total) by mouth every evening. 03/12/14   Dayna N Dunn, PA-C  carvedilol (COREG) 6.25 MG tablet Take 1 tablet (6.25 mg total) by mouth 2 (two) times daily with a meal. 03/12/14   Dayna N Dunn, PA-C  cholecalciferol (VITAMIN D) 1000 UNITS tablet Take 1,000 Units by mouth daily.    Historical Provider, MD  fenofibrate 160 MG tablet Take 160 mg by mouth every evening.     Historical Provider, MD  isosorbide-hydrALAZINE (BIDIL) 20-37.5 MG per tablet Take 1 tablet by mouth 3 (three) times daily. 03/12/14   Dayna N Dunn, PA-C  lisinopril (PRINIVIL,ZESTRIL) 5 MG tablet Take 1 tablet (5 mg total) by mouth 2 (two) times daily. 03/12/14   Dayna N Dunn, PA-C  NOVOLOG 100 UNIT/ML injection  03/04/14   Historical Provider, MD  torsemide (DEMADEX) 20 MG tablet Take 2 tablets (40 mg total) by mouth 2 (two) times daily. 02/25/14   Larey Dresser, MD  traMADol (ULTRAM) 50 MG tablet Take 50-100 mg by mouth every 4 (four) hours as needed for moderate pain.     Historical Provider, MD  TRAVATAN Z 0.004 % SOLN ophthalmic solution Place 1 drop into the left eye at bedtime.  07/04/13   Historical Provider, MD  traZODone (DESYREL) 50 MG tablet Take 50 mg by mouth at bedtime.    Historical Provider, MD   BP 158/55  Pulse 73  Temp(Src) 97.6 F (36.4 C) (Oral)  Resp 16  SpO2 96% Physical Exam  Constitutional: He appears well-developed and well-nourished. No distress.  HENT:  Head: Normocephalic and atraumatic.  Mouth/Throat: Oropharynx is clear and moist. No oropharyngeal exudate.  Eyes: EOM are normal. Pupils are  equal, round, and reactive to light.  Neck: Normal range of motion. Neck supple.  Cardiovascular: Normal rate and regular rhythm.  Exam reveals no friction rub.   No murmur heard. Pulmonary/Chest: Effort normal and breath sounds normal. No respiratory distress. He has no wheezes. He has no rales.  Abdominal: He exhibits no distension. There is no tenderness. There is no rebound.  Musculoskeletal: Normal range of motion. He exhibits no edema.  Neurological: He is alert. He exhibits normal muscle tone. Coordination normal.  Skin: He is not diaphoretic.    ED Course  Procedures (including critical care time) Labs Review Labs Reviewed  CBC - Abnormal; Notable for the following:    WBC 10.6 (*)    RBC 3.19 (*)    Hemoglobin 10.4 (*)    HCT 30.3 (*)    All other components within normal limits  COMPREHENSIVE METABOLIC PANEL - Abnormal; Notable for the following:    Potassium 3.3 (*)    Glucose, Bld 104 (*)    BUN 36 (*)    Creatinine, Ser 1.43 (*)    Albumin 3.2 (*)    AST 58 (*)    GFR calc non Af Amer 44 (*)    GFR calc Af Amer 51 (*)    All other components within normal limits  CBG MONITORING, ED - Abnormal; Notable for the following:    Glucose-Capillary <10 (*)    All other components within normal limits  I-STAT CHEM 8, ED - Abnormal; Notable for the following:    Potassium 3.5 (*)    BUN 49 (*)    Creatinine, Ser 1.60 (*)    Glucose, Bld 102 (*)    Calcium, Ion 1.11 (*)    Hemoglobin 11.9 (*)    HCT 35.0 (*)    All other components within normal limits  CBG MONITORING, ED - Abnormal; Notable for the following:    Glucose-Capillary 32 (*)    All other components within normal limits  CULTURE, BLOOD (ROUTINE X 2)  CULTURE, BLOOD (ROUTINE X 2)  URINE CULTURE  URINALYSIS, ROUTINE W REFLEX MICROSCOPIC  CBG MONITORING, ED  I-STAT TROPOININ, ED  CBG MONITORING, ED  CBG MONITORING, ED    Imaging Review Dg Chest 2 View  04/05/2014   CLINICAL DATA:  Hypoglycemia   EXAM: CHEST  2 VIEW  COMPARISON:  03/03/2014  FINDINGS: Moderate cardiac enlargement. There is a persistent right pleural effusion with overlying atelectasis. Mild interstitial edema is suspected.  IMPRESSION: 1. Persistent CHF.   Electronically Signed   By: Kerby Moors M.D.   On: 04/05/2014 21:50     EKG Interpretation None      Date: 04/06/2014  Rate: 80s  Rhythm: normal sinus rhythm  QRS Axis: normal  Intervals: normal  ST/T Wave abnormalities: nonspecific ST/T changes  Conduction Disutrbances:none  Narrative Interpretation:   Old EKG Reviewed: unchanged   CRITICAL CARE Performed by: Osvaldo Shipper   Total critical care time: 30 minutes  Critical care time was exclusive of separately billable procedures and treating other patients.  Critical care was necessary to treat or prevent imminent or life-threatening deterioration.  Critical care was time spent personally by me on the following activities: development of treatment plan with patient and/or surrogate as well as nursing, discussions with consultants, evaluation of patient's response to treatment, examination of patient, obtaining history from patient or surrogate, ordering and performing treatments and interventions, ordering and review of laboratory studies, ordering and review of radiographic studies, pulse oximetry and re-evaluation of patient's condition.  MDM   Final diagnoses:  Hypoglycemia    80 year old male here from nursing home. He was given normal 10 units of insulin with lunch. Sugar was checked because he was more sluggish than normal. It was 28. Multiple repeat blood sugar checks in the 30s. He received 3 amps of glucagon without relief of his low blood sugar. At his mental baseline here.  Will check labs and start D10 fluids. D50 initially for repeat low sugars. Repeat after D50 back into the 70s, persistently dropping. Persistent hypoglycemia despite D50 and D5/10 drip - admitted.   Evelina Bucy, MD 04/05/14 [id number]  Evelina Bucy, MD 04/06/14 [phone]

## 2014-04-05 NOTE — H&P (Signed)
[facility]  History and Physical [name] [name] L. [name], MD Pager [phone] (if 7P to 7A, page night hospitalist on [email or web address] [name] LZJ:[id number] DOB: 13-Mar-1931 DOA: 04/05/2014  Referring physician: ED PCP: [name] [name] ANN, NP   Chief Complaint: hypoglycemia  HPI:  80 yo with history of possible interstitial lung disease, metapneumovirus PNA in 5/15 with intubation, CKD, ?dementia, and CHF (primarily diastolic) w/ recent admission in July for exacerbation requiring milrinone in therapy who presents from nursing home for persistent hypoglycemia. On record review the patient was on both novolog 10u w lunch and SSI w/ bfast and dinner. He did receive the sliding scale today.   In the ED, he was noted to have glucose in the 32-102 range on D10W w/ most recent at 55. Given persistent hypoglycemia despite IV dextrose, we are consulted to admit. Cr is slightly above baseline. Pt is alert but minimally conversive at this time    Cards - [name] [name] - [name] [name]   Chart Review:  Prior admission notes. Med rec from [facility]. MOST form reviewed  Review of Systems:  Neg except as noted above   Past Medical History  Diagnosis Date  . Hypertension   . Diabetes mellitus     a. Diet-controlled.  . Arthritis   . Chronic diastolic CHF (congestive heart failure)     a. Primarily diastolic CHF & RV dysfunction but systolic dysfunction. 2D Echo 03/07/14 showed EF 40-45%, diffuse HK, grade 1 diastolic dysfunction, mild MR/AI, mod dilated LA/RA, mildly dilated RV, PA pressure 60mHg. Lexiscan nuc 02/2014: EF 43% with no ischemia, inferior hypokinesis with "inferior attenuation." Dr. Aundra Dubin suspected probable prior inferior MI.  Marland Kitchen COPD (chronic obstructive pulmonary disease)   . Hyperlipidemia   . CKD (chronic kidney disease), stage III     renal insuficiency  . Inferior MI     a. Based on nuc results 02/2014, Dr. Aundra Dubin suspected probable prior inferior MI (EF 43% with no  ischemia, inferior hypokinesis with "inferior attenuation").  . Dementia   . PAD (peripheral artery disease)     a. ABI 02/2014: R 0.45, L 0.56. R foot nonhealing ulcers.  . Mild mitral regurgitation   . Mild AI (aortic insufficiency)   . Lung disease     a. Possible interstitial lung disease: Followed by [name]. PFTs (1/15) with FVC 53%, FEV1 46%, ratio 85%, TLC 62%, DLCO 45% (restrictive defect).  . Human metapneumovirus pneumonia     a. Metapneumovirus PNA in 5/15 with acute respiratory failure and intubation. Bilateral transudative pleural effusions.    Past Surgical History  Procedure Laterality Date  . Leg surgery      both legs - unsure of exact operations - from trauma  . Tonsillectomy    . Appendectomy    . Cataract extraction w/phaco Left 09/04/2013    Procedure: CATARACT EXTRACTION PHACO AND INTRAOCULAR LENS PLACEMENT (IOC);  Surgeon: Adonis Brook, MD;  Location: [facility];  Service: Ophthalmology;  Laterality: Left;    Social History:  reports that he quit smoking about 4 years ago. His smoking use included Cigarettes. He has a 35 pack-year smoking history. He has never used smokeless tobacco. He reports that he does not drink alcohol or use illicit drugs.  No Known Allergies  Family History  Problem Relation Age of Onset  . Diabetes Father   . Diabetes Mother      Prior to Admission medications   Medication Sig Start Date End Date Taking? Authorizing Provider  acetaminophen (  TYLENOL) 500 MG tablet Take 500 mg by mouth every 6 (six) hours as needed for moderate pain.    Yes Historical Provider, MD  albuterol (PROVENTIL HFA;VENTOLIN HFA) 108 (90 BASE) MCG/ACT inhaler Inhale 2 puffs into the lungs every 4 (four) hours as needed for wheezing or shortness of breath.    Yes Historical Provider, MD  albuterol (PROVENTIL) (2.5 MG/3ML) 0.083% nebulizer solution Take 2.5 mg by nebulization See admin instructions. Use 1 neb three times a day AND use 1 neb every 12 hours as  needed for shortness of breath   Yes Historical Provider, MD  Amino Acids-Protein Hydrolys (FEEDING SUPPLEMENT, PRO-STAT SUGAR FREE 64,) LIQD Take 30 mLs by mouth 3 (three) times daily with meals.   Yes Historical Provider, MD  aspirin EC 81 MG tablet Take 81 mg by mouth every evening.    Yes Historical Provider, MD  atorvastatin (LIPITOR) 20 MG tablet Take 1 tablet (20 mg total) by mouth every evening. 03/12/14  Yes [name] N Dunn, PA-C  carvedilol (COREG) 6.25 MG tablet Take 1 tablet (6.25 mg total) by mouth 2 (two) times daily with a meal. 03/12/14  Yes [name] N Dunn, PA-C  cholecalciferol (VITAMIN D) 1000 UNITS tablet Take 2,000 Units by mouth daily.    Yes Historical Provider, MD  fenofibrate 160 MG tablet Take 160 mg by mouth every evening.    Yes Historical Provider, MD  gabapentin (NEURONTIN) 100 MG capsule Take 200 mg by mouth 2 (two) times daily.   Yes Historical Provider, MD  isosorbide-hydrALAZINE (BIDIL) 20-37.5 MG per tablet Take 1 tablet by mouth 3 (three) times daily. 03/12/14  Yes [name] N Dunn, PA-C  lisinopril (PRINIVIL,ZESTRIL) 5 MG tablet Take 1 tablet (5 mg total) by mouth 2 (two) times daily. 03/12/14  Yes [name] N Dunn, PA-C  NOVOLOG 100 UNIT/ML injection Inject 0-10 Units into the skin as directed. 10 units daily with lunch (SCHEDULED) & Sliding Scale= 101-150=1 unit, 151-200=2 units, 201-250=3 units, 251-300=5 units, 301-350=7 units, >350=9 units (PRN) 03/04/14  Yes Historical Provider, MD  torsemide (DEMADEX) 20 MG tablet Take 20-40 mg by mouth as directed. 40 mg twice daily (scheduled) & 20 mg For weight gain of 3 lbs in 24 hrs. (PRN)   Yes Historical Provider, MD  traMADol (ULTRAM) 50 MG tablet Take 50-100 mg by mouth every 4 (four) hours as needed for moderate pain.    Yes Historical Provider, MD  TRAVATAN Z 0.004 % SOLN ophthalmic solution Place 1 drop into the left eye at bedtime.  07/04/13  Yes Historical Provider, MD  traZODone (DESYREL) 50 MG tablet Take 50 mg by mouth at  bedtime.   Yes Historical Provider, MD   Physical Exam: Filed Vitals:   04/05/14 1946 04/05/14 1953 04/05/14 2210  BP:  158/55 149/50  Pulse:  73 56  Temp:  97.6 F (36.4 C)   TempSrc:  Oral   Resp:  16 16  SpO2: 98% 96% 100%   AAM that is resting comfortably. His MM are moist. He is anicteric. NSR   w/o 2/6 SEM . Good air movement w/o crackles or wheezes. Has areas of depigmentation on B/L feet. No edema noted. abd NT/ND , soft. He follows verbal command but seems to tired to converse appropriately . Protecting area w/o difficulty    Wt Readings from Last 3 Encounters:  04/01/14 181 lb 3.2 oz (82.192 kg)  03/18/14 188 lb 3.2 oz (85.367 kg)  03/12/14 181 lb (82.1 kg)    Labs on  Admission:  Basic Metabolic Panel:  Recent Labs Lab 04/05/14 2049 04/05/14 2058  NA 138 138  K 3.3* 3.5*  CL 99 96  CO2 25  --   GLUCOSE 104* 102*  BUN 36* 49*  CREATININE 1.43* 1.60*  CALCIUM 8.7  --     Liver Function Tests:  Recent Labs Lab 04/05/14 2049  AST 58*  ALT 27  ALKPHOS 74  BILITOT 1.0  PROT 7.4  ALBUMIN 3.2*   No results found for this basename: LIPASE, AMYLASE,  in the last 168 hours No results found for this basename: AMMONIA,  in the last 168 hours  CBC:  Recent Labs Lab 04/05/14 2039 04/05/14 2058  WBC 10.6*  --   HGB 10.4* 11.9*  HCT 30.3* 35.0*  MCV 95.0  --   PLT 263  --     Cardiac Enzymes: No results found for this basename: CKTOTAL, CKMB, CKMBINDEX, TROPONINI,  in the last 168 hours  Troponin (Point of Care Test)  Recent Labs  04/05/14 2101  TROPIPOC 0.02    BNP (last 3 results)  Recent Labs  12/31/13 0314 02/25/14 1217 03/06/14 1539  PROBNP 7302.0* 11460.0* 6799.0*    CBG:  Recent Labs Lab 04/05/14 2008 04/05/14 2029 04/05/14 2124 04/05/14 2212  GLUCAP <10* 94 32* 72     Radiological Exams on Admission: Dg Chest 2 View  04/05/2014   CLINICAL DATA:  Hypoglycemia  EXAM: CHEST  2 VIEW  COMPARISON:  03/03/2014  FINDINGS:  Moderate cardiac enlargement. There is a persistent right pleural effusion with overlying atelectasis. Mild interstitial edema is suspected.  IMPRESSION: 1. Persistent CHF.   Electronically Signed   By: Kerby Moors M.D.   On: 04/05/2014 21:50    EKG: Independently reviewed. Nonspecific t wave abnormalities. NSR    Assessment/Plan Hypoglycemia - use extreme caution w/ IVF . D10W at 50cc/hr + finger stick glucose q4 hours w/ 1 amp D50 prn for glucose < 60. Unclear etiology.I suspect that this is mediation induced. Holding all insulin at this time. Admit to SDU for close monitoring    1. Diastolic CHF - only use the D10W ggt for as short duration as possible. Daily weights . Strict I/Os . Home meds  2. CKD stage III - use caution w/ CHF and fluid overload risk . Daily BMet  3. Dementia - home meds  4. DM - holding insulin while hypoglycemic 5. HTN - cont home meds  7.    Right foot ulcerations with PAD - ABIs R 0.45, L 0.56, awaiting outpt vascular f/u -- cont ASA 81   Code Status: DNR per MOST form w/ him on file  Family Communication: none Disposition Plan/Anticipated LOS: 2-4 days   Time spent: 38 minutes  Velna Hatchet, MD  Internal Medicine Pager [phone] If 7PM-7AM, please contact night-coverage at [email or web address], password [facility] 04/05/2014, 10:52 PM

## 2014-04-05 NOTE — ED Notes (Signed)
Patient ate lunch at 11 and patient is on a sliding scale. Patient was given 10 units of Insulin. Patient was not acting right (sluggish). Blood sugar was checked BS-28. Patient was given 50 of Dextrose was given by nursing home. Then the sugar came up. Then the checked it at 5pm- BS-77. At 5:55pm blood sugar was checked again BS-28. Patient was alert. Fire gave patient oral glucose and it brought sugar up to 59.

## 2014-04-06 ENCOUNTER — Encounter (HOSPITAL_COMMUNITY): Payer: Self-pay | Admitting: *Deleted

## 2014-04-06 LAB — CBC
HCT: 31.9 % — ABNORMAL LOW (ref 39.0–52.0)
Hemoglobin: 10.9 g/dL — ABNORMAL LOW (ref 13.0–17.0)
MCH: 32.7 pg (ref 26.0–34.0)
MCHC: 34.2 g/dL (ref 30.0–36.0)
MCV: 95.8 fL (ref 78.0–100.0)
PLATELETS: 236 10*3/uL (ref 150–400)
RBC: 3.33 MIL/uL — AB (ref 4.22–5.81)
RDW: 13.6 % (ref 11.5–15.5)
WBC: 9 10*3/uL (ref 4.0–10.5)

## 2014-04-06 LAB — GLUCOSE, CAPILLARY
GLUCOSE-CAPILLARY: 106 mg/dL — AB (ref 70–99)
GLUCOSE-CAPILLARY: 173 mg/dL — AB (ref 70–99)
Glucose-Capillary: 57 mg/dL — ABNORMAL LOW (ref 70–99)
Glucose-Capillary: 136 mg/dL — ABNORMAL HIGH (ref 70–99)
Glucose-Capillary: 145 mg/dL — ABNORMAL HIGH (ref 70–99)
Glucose-Capillary: 139 mg/dL — ABNORMAL HIGH (ref 70–99)
Glucose-Capillary: 304 mg/dL — ABNORMAL HIGH (ref 70–99)
Glucose-Capillary: 139 mg/dL — ABNORMAL HIGH (ref 70–99)

## 2014-04-06 LAB — CBG MONITORING, ED: Glucose-Capillary: 21 mg/dL — CL (ref 70–99)

## 2014-04-06 LAB — BASIC METABOLIC PANEL
Anion gap: 11 (ref 5–15)
BUN: 36 mg/dL — ABNORMAL HIGH (ref 6–23)
CO2: 26 mEq/L (ref 19–32)
CREATININE: 1.4 mg/dL — AB (ref 0.50–1.35)
Calcium: 8.4 mg/dL (ref 8.4–10.5)
Chloride: 98 mEq/L (ref 96–112)
GFR, EST AFRICAN AMERICAN: 53 mL/min — AB (ref >90)
GFR, EST NON AFRICAN AMERICAN: 46 mL/min — AB (ref >90)
Glucose, Bld: 127 mg/dL — ABNORMAL HIGH (ref 70–99)
POTASSIUM: 3.7 meq/L (ref 3.7–5.3)
Sodium: 135 mEq/L — ABNORMAL LOW (ref 137–147)

## 2014-04-06 LAB — MRSA PCR SCREENING: MRSA by PCR: NEGATIVE

## 2014-04-06 MED ORDER — FENOFIBRATE 160 MG PO TABS
160.0000 mg | ORAL_TABLET | Freq: Every evening | ORAL | Status: DC
  Administered 2014-04-06 – 2014-04-07 (×2): 160 mg via ORAL
  Filled 2014-04-06 (×4): qty 1

## 2014-04-06 MED ORDER — ACETAMINOPHEN 500 MG PO TABS: 500.0000 mg | ORAL_TABLET | Freq: Four times a day (QID) | ORAL | Status: DC | PRN

## 2014-04-06 MED ORDER — ALBUTEROL SULFATE (2.5 MG/3ML) 0.083% IN NEBU
2.5000 mg | INHALATION_SOLUTION | RESPIRATORY_TRACT | Status: DC | PRN
  Administered 2014-04-06: 2.5 mg via RESPIRATORY_TRACT
  Filled 2014-04-06: qty 3

## 2014-04-06 MED ORDER — ENOXAPARIN SODIUM 40 MG/0.4ML ~~LOC~~ SOLN
40.0000 mg | SUBCUTANEOUS | Status: DC
  Administered 2014-04-07 – 2014-04-08 (×2): 40 mg via SUBCUTANEOUS
  Filled 2014-04-06 (×2): qty 0.4

## 2014-04-06 MED ORDER — CARVEDILOL 6.25 MG PO TABS
6.2500 mg | ORAL_TABLET | Freq: Two times a day (BID) | ORAL | Status: DC
  Administered 2014-04-06 – 2014-04-08 (×5): 6.25 mg via ORAL
  Filled 2014-04-06 (×7): qty 1

## 2014-04-06 MED ORDER — TORSEMIDE 20 MG PO TABS
40.0000 mg | ORAL_TABLET | Freq: Two times a day (BID) | ORAL | Status: DC
  Administered 2014-04-06 – 2014-04-08 (×5): 40 mg via ORAL
  Filled 2014-04-06 (×8): qty 2

## 2014-04-06 MED ORDER — SODIUM CHLORIDE 0.9 % IV SOLN
INTRAVENOUS | Status: DC
  Administered 2014-04-06: 15:00:00 via INTRAVENOUS

## 2014-04-06 MED ORDER — ALBUTEROL SULFATE HFA 108 (90 BASE) MCG/ACT IN AERS: 2.0000 | INHALATION_SPRAY | RESPIRATORY_TRACT | Status: DC | PRN

## 2014-04-06 MED ORDER — SODIUM CHLORIDE 0.9 % IJ SOLN
3.0000 mL | Freq: Two times a day (BID) | INTRAMUSCULAR | Status: DC
  Administered 2014-04-06 – 2014-04-08 (×3): 3 mL via INTRAVENOUS

## 2014-04-06 MED ORDER — DEXTROSE 50 % IV SOLN
50.0000 mL | Freq: Once | INTRAVENOUS | Status: AC
  Administered 2014-04-06: 50 mL via INTRAVENOUS

## 2014-04-06 MED ORDER — GABAPENTIN 100 MG PO CAPS
200.0000 mg | ORAL_CAPSULE | Freq: Two times a day (BID) | ORAL | Status: DC
  Administered 2014-04-06 – 2014-04-08 (×6): 200 mg via ORAL
  Filled 2014-04-06 (×7): qty 2

## 2014-04-06 MED ORDER — PRO-STAT SUGAR FREE PO LIQD
30.0000 mL | Freq: Three times a day (TID) | ORAL | Status: DC
Start: 2014-04-06 — End: 2014-04-08
  Administered 2014-04-06 – 2014-04-08 (×8): 30 mL via ORAL
  Filled 2014-04-06 (×11): qty 30

## 2014-04-06 MED ORDER — LISINOPRIL 5 MG PO TABS
5.0000 mg | ORAL_TABLET | Freq: Two times a day (BID) | ORAL | Status: DC
  Administered 2014-04-06 – 2014-04-07 (×4): 5 mg via ORAL
  Filled 2014-04-06 (×2): qty 2
  Filled 2014-04-06: qty 1
  Filled 2014-04-06 (×2): qty 2

## 2014-04-06 MED ORDER — DEXTROSE 50 % IV SOLN
1.0000 | INTRAVENOUS | Status: DC | PRN
  Administered 2014-04-06: 50 mL via INTRAVENOUS
  Filled 2014-04-06: qty 50

## 2014-04-06 MED ORDER — DEXTROSE 50 % IV SOLN
1.0000 | Freq: Once | INTRAVENOUS | Status: DC
  Filled 2014-04-06: qty 50

## 2014-04-06 MED ORDER — TRAMADOL HCL 50 MG PO TABS
50.0000 mg | ORAL_TABLET | ORAL | Status: DC | PRN
  Administered 2014-04-07: 50 mg via ORAL
  Administered 2014-04-07: 100 mg via ORAL
  Filled 2014-04-06: qty 2
  Filled 2014-04-06 (×2): qty 1

## 2014-04-06 MED ORDER — ASPIRIN EC 81 MG PO TBEC
81.0000 mg | DELAYED_RELEASE_TABLET | Freq: Every evening | ORAL | Status: DC
  Administered 2014-04-06 – 2014-04-07 (×2): 81 mg via ORAL
  Filled 2014-04-06 (×3): qty 1

## 2014-04-06 MED ORDER — TRAZODONE HCL 50 MG PO TABS
50.0000 mg | ORAL_TABLET | Freq: Every day | ORAL | Status: DC
  Administered 2014-04-06 – 2014-04-07 (×2): 50 mg via ORAL
  Filled 2014-04-06 (×3): qty 1

## 2014-04-06 MED ORDER — ENOXAPARIN SODIUM 30 MG/0.3ML ~~LOC~~ SOLN
30.0000 mg | SUBCUTANEOUS | Status: DC
  Administered 2014-04-06: 30 mg via SUBCUTANEOUS
  Filled 2014-04-06: qty 0.3

## 2014-04-06 MED ORDER — ISOSORB DINITRATE-HYDRALAZINE 20-37.5 MG PO TABS
1.0000 | ORAL_TABLET | Freq: Three times a day (TID) | ORAL | Status: DC
  Administered 2014-04-06 – 2014-04-08 (×7): 1 via ORAL
  Filled 2014-04-06 (×9): qty 1

## 2014-04-06 MED ORDER — INSULIN ASPART 100 UNIT/ML ~~LOC~~ SOLN
0.0000 [IU] | Freq: Three times a day (TID) | SUBCUTANEOUS | Status: DC
  Administered 2014-04-06: 2 [IU] via SUBCUTANEOUS

## 2014-04-06 MED ORDER — ATORVASTATIN CALCIUM 20 MG PO TABS
20.0000 mg | ORAL_TABLET | Freq: Every evening | ORAL | Status: DC
  Administered 2014-04-06 – 2014-04-07 (×2): 20 mg via ORAL
  Filled 2014-04-06 (×2): qty 1
  Filled 2014-04-06: qty 2

## 2014-04-06 MED ORDER — LATANOPROST 0.005 % OP SOLN
1.0000 [drp] | Freq: Every day | OPHTHALMIC | Status: DC
  Administered 2014-04-06 – 2014-04-07 (×3): 1 [drp] via OPHTHALMIC
  Filled 2014-04-06: qty 2.5

## 2014-04-06 NOTE — Progress Notes (Signed)
Patient ID: [name] [name], male   DOB: [birth year], 80 y.o.   MRN: [id number] [facility] PROGRESS NOTE  [name] [name] [id number]:[id number] DOB: [birth year] DOA: 04/05/2014 PCP: [name], NP  Brief narrative: 80 yo with history of interstitial lung disease, metapneumovirus PNA in 5/15 with intubation, CKD, ? dementia, and CHF (primarily diastolic) w/ recent admission in July for exacerbation requiring milrinone in therapy who presented from nursing home for persistent hypoglycemia. On record review the patient was on both novolog 10u w lunch and SSI w/ bfast and dinner.   In the ED, he was noted to have glucose in the 32-102 range on D10W. Given persistent hypoglycemia despite IV dextrose, we wew consulted to admit. Cr is slightly above baseline.  Cards - [name] - [name]  Assessment and Plan:    Active Problems:   Hypoglycemia in the setting of uncontrolled diabetes with complications of neuropathy, renal disease  - last known A1C 10 in 2014 - will ask for A1C now, place on SSI and allow pt to eat regular food - hold off in IVF due to CHF  - encourage PO intake  - transfer to telemetry bed    Acute on chronic combined systolic and diastolic CHF - last 2 D ECHO 02/2014 with EF 45 % and grade I diastolic CHF - stop IVF - weight today is 194 lbs - continue to monitor daily weights, strict I's and O's   HTN, accelerate - reasonable inpatient control - continue coreg, Imdur, torsemide, hydralazine, lisinopril   HLD  - continue statin    Dementia - will need PT/OT evaluation but will likely go to SNF upon discharge    Moderate malnutrition - secondary to progressive failure to thrive with underlying dementia - advance diet as pt able to tolerate    Acute renal failure imposed on chronic renal failure stage II - III - Cr 1.6 and likely up due to dehydration - IVF provided and Cr trending down - repeat BMP in AM - stop IVF due to CHF   Hypokalemia - supplemented and  WNL this AM   Anemia of chronic disease - Hg stable and at baseline - no signs of active bleeding - repeat CBC in AM  DVT prophylaxis  Lovenox SQ while pt is in hospital  Code Status: Full Family Communication: Pt at bedside Disposition Plan: Home when medically stable   IV Access:   Peripheral IV Procedures and diagnostic studies:   Dg Chest 2 View  04/05/2014  Persistent CHF.    Medical Consultants:   None Other Consultants:   Physical therapy  Anti-Infectives:   None  Faye Ramsay, MD  [facility] Pager [phone]  If 7PM-7AM, please contact night-coverage [email or web address] Password [email or web address] 04/06/2014, 9:25 AM   LOS: 1 day   HPI/Subjective: No events overnight.   Objective: Filed Vitals:   04/06/14 0400 04/06/14 0500 04/06/14 0600 04/06/14 0817  BP: 154/63 168/54 160/55 161/56  Pulse: 61 58 61 64  Temp: 97.4 F (36.3 C)     TempSrc: Axillary     Resp: 13 12 17    Height:      Weight: 88.2 kg (194 lb 7.1 oz)     SpO2: 100% 100% 100%     Intake/Output Summary (Last 24 hours) at 04/06/14 0925 Last data filed at 04/06/14 0842  Gross per 24 hour  Intake    880 ml  Output    600 ml  Net    280 ml    Exam:  General:  Pt is alert, follows commands appropriately, not in acute distress  Cardiovascular: Regular rate and rhythm,  no rubs, no gallops  Respiratory: Clear to auscultation bilaterally, no wheezing, mild crackles at bases   Abdomen: Soft, non tender, non distended, bowel sounds present, no guarding  Extremities: Trace bilateral LE pitting edema, pulses DP and PT palpable bilaterally  Neuro: Grossly nonfocal  Data Reviewed: Basic Metabolic Panel:  Recent Labs Lab 04/05/14 2049 04/05/14 2058 04/06/14 0354  NA 138 138 135*  K 3.3* 3.5* 3.7  CL 99 96 98  CO2 25  --  26  GLUCOSE 104* 102* 127*  BUN 36* 49* 36*  CREATININE 1.43* 1.60* 1.40*  CALCIUM 8.7  --  8.4   Liver Function Tests:  Recent Labs Lab 04/05/14 2049  AST 58*  ALT 27   ALKPHOS 74  BILITOT 1.0  PROT 7.4  ALBUMIN 3.2*   CBC:  Recent Labs Lab 04/05/14 2039 04/05/14 2058 04/06/14 0354  WBC 10.6*  --  9.0  HGB 10.4* 11.9* 10.9*  HCT 30.3* 35.0* 31.9*  MCV 95.0  --  95.8  PLT 263  --  236   CBG:  Recent Labs Lab 04/06/14 0028 04/06/14 0215 04/06/14 0245 04/06/14 0431 04/06/14 0808  GLUCAP 106* 57* 136* 145* 139*    Recent Results (from the past 240 hour(s))  MRSA PCR SCREENING     Status: None   Collection Time    04/06/14 12:34 AM      Result Value Ref Range Status   MRSA by PCR NEGATIVE  NEGATIVE Final   Comment:            The GeneXpert MRSA Assay (FDA     approved for NASAL specimens     only), is one component of a     comprehensive MRSA colonization     surveillance program. It is not     intended to diagnose MRSA     infection nor to guide or     monitor treatment for     MRSA infections.     Scheduled Meds: . aspirin EC  81 mg Oral QPM  . atorvastatin  20 mg Oral QPM  . carvedilol  6.25 mg Oral BID WC  . enoxaparin  injection  30 mg Subcutaneous Q24H  . fenofibrate  160 mg Oral QPM  . gabapentin  200 mg Oral BID  . isosorbide-hydrALAZINE  1 tablet Oral TID  . latanoprost  1 drop Left Eye QHS  . lisinopril  5 mg Oral BID  . torsemide  40 mg Oral BID  . traZODone  50 mg Oral QHS   Continuous Infusions: . dextrose 100 mL/hr at 04/06/14 0700

## 2014-04-06 NOTE — Evaluation (Signed)
Physical Therapy Evaluation Patient Details Name: [name] [name] MRN: [id number] DOB: 1930-10-15 Today's Date: 04/06/2014   History of Present Illness  Pt is an 80 y/o male admitted 8/22 fron SNF with hypoglycemia.  Clinical Impression  Pt pleasantly confused, able to mobilize to recliner, noted wheezing, Sats > 94% even briefly off of O2 for transfer. Pt will benefit from PT while in acute care to  Prevent decline in function in  Order to return to SNF.    Follow Up Recommendations SNF    Equipment Recommendations  None recommended by PT    Recommendations for Other Services       Precautions / Restrictions Precautions Precautions: Fall      Mobility  Bed Mobility Overal bed mobility: Needs Assistance Bed Mobility: Supine to Sit     Supine to sit: Min assist     General bed mobility comments: extra time to  get to upright, use of rail  Transfers Overall transfer level: Needs assistance Equipment used: Rolling walker (2 wheeled) Transfers: Sit to/from [facility] Sit to Stand: Min assist;From elevated surface Stand pivot transfers: Min assist       General transfer comment: cues for hand palcement, safety with RW  Ambulation/Gait                Stairs            Wheelchair Mobility    Modified Rankin (Stroke Patients Only)       Balance Overall balance assessment: Needs assistance Sitting-balance support: No upper extremity supported;Feet supported Sitting balance-Leahy Scale: Fair     Standing balance support: Bilateral upper extremity supported Standing balance-Leahy Scale: Fair                               Pertinent Vitals/Pain Pain Assessment: No/denies pain    Home Living Family/patient expects to be discharged to:: Skilled nursing facility                      Prior Function Level of Independence: Needs assistance         Comments: uncertain of ambulatory status     Hand Dominance         Extremity/Trunk Assessment   Upper Extremity Assessment: Generalized weakness           Lower Extremity Assessment: Generalized weakness         Communication      Cognition Arousal/Alertness: Awake/alert Behavior During Therapy: WFL for tasks assessed/performed Overall Cognitive Status: History of cognitive impairments - at baseline                      General Comments      Exercises        Assessment/Plan    PT Assessment Patient needs continued PT services  PT Diagnosis Difficulty walking;Generalized weakness   PT Problem List Decreased strength;Decreased activity tolerance;Decreased mobility;Decreased cognition;Decreased safety awareness  PT Treatment Interventions DME instruction;Gait training;Functional mobility training;Therapeutic activities;Therapeutic exercise;Patient/family education   PT Goals (Current goals can be found in the Care Plan section) Acute Rehab PT Goals Patient Stated Goal:  I want my breakfast with some bacon PT Goal Formulation: Patient unable to participate in goal setting Time For Goal Achievement: 04/20/14 Potential to Achieve Goals: Fair    Frequency Min 2X/week   Barriers to discharge        Co-evaluation  End of Session   Activity Tolerance: Patient tolerated treatment well Patient left: in chair;with call bell/phone within reach;with chair alarm set Nurse Communication: Mobility status         Time: 4970-2637 PT Time Calculation (min): 19 min   Charges:   PT Evaluation $Initial PT Evaluation Tier I: 1 Procedure PT Treatments $Therapeutic Activity: 8-22 mins   PT G Codes:          [name] 04/06/2014, 9:25 AM

## 2014-04-07 LAB — URINE CULTURE
COLONY COUNT: NO GROWTH
CULTURE: NO GROWTH
Special Requests: NORMAL

## 2014-04-07 LAB — CBC
HCT: 29.4 % — ABNORMAL LOW (ref 39.0–52.0)
Hemoglobin: 10 g/dL — ABNORMAL LOW (ref 13.0–17.0)
MCH: 32.5 pg (ref 26.0–34.0)
MCHC: 34 g/dL (ref 30.0–36.0)
MCV: 95.5 fL (ref 78.0–100.0)
PLATELETS: 247 10*3/uL (ref 150–400)
RBC: 3.08 MIL/uL — AB (ref 4.22–5.81)
RDW: 13.7 % (ref 11.5–15.5)
WBC: 6.4 10*3/uL (ref 4.0–10.5)

## 2014-04-07 LAB — HEMOGLOBIN A1C
Hgb A1c MFr Bld: 6.1 % — ABNORMAL HIGH (ref <5.7)
Mean Plasma Glucose: 128 mg/dL — ABNORMAL HIGH (ref <117)

## 2014-04-07 LAB — BASIC METABOLIC PANEL
ANION GAP: 11 (ref 5–15)
BUN: 41 mg/dL — ABNORMAL HIGH (ref 6–23)
CHLORIDE: 97 meq/L (ref 96–112)
CO2: 26 meq/L (ref 19–32)
CREATININE: 1.52 mg/dL — AB (ref 0.50–1.35)
Calcium: 8.2 mg/dL — ABNORMAL LOW (ref 8.4–10.5)
GFR calc non Af Amer: 41 mL/min — ABNORMAL LOW (ref >90)
GFR, EST AFRICAN AMERICAN: 48 mL/min — AB (ref >90)
Glucose, Bld: 90 mg/dL (ref 70–99)
POTASSIUM: 3.8 meq/L (ref 3.7–5.3)
SODIUM: 134 meq/L — AB (ref 137–147)

## 2014-04-07 LAB — GLUCOSE, CAPILLARY
GLUCOSE-CAPILLARY: 86 mg/dL (ref 70–99)
GLUCOSE-CAPILLARY: 97 mg/dL (ref 70–99)
GLUCOSE-CAPILLARY: 120 mg/dL — AB (ref 70–99)
Glucose-Capillary: 116 mg/dL — ABNORMAL HIGH (ref 70–99)
Glucose-Capillary: 122 mg/dL — ABNORMAL HIGH (ref 70–99)

## 2014-04-07 LAB — PRO B NATRIURETIC PEPTIDE: Pro B Natriuretic peptide (BNP): 6851 pg/mL — ABNORMAL HIGH (ref 0–450)
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
## 2014-04-07 NOTE — Progress Notes (Signed)
Clinical Social Work Department CLINICAL SOCIAL WORK PLACEMENT NOTE 04/07/2014  Patient:  NILAN, IDDINGS  Account Number:  [id number] Admit date:  04/05/2014  Clinical Social Worker:  Ulyess Blossom  Date/time:  04/07/2014 03:15 PM  Clinical Social Work is seeking post-discharge placement for this patient at the following level of care:   SKILLED NURSING   (*CSW will update this form in Epic as items are completed)   04/07/2014  Patient/family provided with [place] Department of Clinical Social Work's list of facilities offering this level of care within the geographic area requested by the patient (or if unable, by the patient's family).  04/07/2014  Patient/family informed of their freedom to choose among providers that offer the needed level of care, that participate in Medicare, Medicaid or managed care program needed by the patient, have an available bed and are willing to accept the patient.  04/07/2014  Patient/family informed of [facility]' ownership interest in [facility], as well as of the fact that they are under no obligation to receive care at this facility.  PASARR submitted to EDS on 04/07/2014 PASARR number received on 04/07/2014  FL2 transmitted to all facilities in geographic area requested by pt/family on  04/07/2014 FL2 transmitted to all facilities within larger geographic area on   Patient informed that his/her managed care company has contracts with or will negotiate with  certain facilities, including the following:     Patient/family informed of bed offers received:   Patient chooses bed at  Physician recommends and patient chooses bed at    Patient to be transferred to  on   Patient to be transferred to facility by  Patient and family notified of transfer on  Name of family member notified:    The following physician request were entered in Epic:   Additional Comments:   [name], MSW, [place] Work [phone]

## 2014-04-07 NOTE — Progress Notes (Signed)
Patient ID: [name] [name], male   DOB: 1931-08-07, 80 y.o.   MRN: [id number] [facility] PROGRESS NOTE  Autumn Gunn [id number]:[id number] DOB: 01/14/1931 DOA: 04/05/2014 PCP: [name], NP  Brief narrative:  80 yo with history of interstitial lung disease, metapneumovirus PNA in 5/15 with intubation, CKD, ? dementia, and CHF (primarily diastolic) w/ recent admission in July for exacerbation requiring milrinone in therapy who presented from nursing home for persistent hypoglycemia. On record review the patient was on both novolog 10u w lunch and SSI w/ bfast and dinner.   In the ED, he was noted to have glucose in the 32-102 range on D10W. Given persistent hypoglycemia despite IV dextrose, we wew consulted to admit. Cr is slightly above baseline.   Cards - [name] - [name]   Assessment and Plan:   Active Problems:  Hypoglycemia in the setting of uncontrolled diabetes with complications of neuropathy, renal disease  - last known A1C 10 in 2014, repeat A1C this admission 6.1 - would not provide metformin due to renal failure but will consider glipizide only upon discharge  - hold off in IVF due to CHF  - encourage PO intake  Acute on chronic combined systolic and diastolic CHF  - last 2 D ECHO 02/2014 with EF 45 % and grade I diastolic CHF - stopped IVF  - weight trend:194 lbs --> 198 lbs this AM  - continue to monitor daily weights, strict I's and O's  HTN, accelerate  - reasonable inpatient control  - continue coreg, Imdur, torsemide, hydralazine - stop lisinopril until renal function stabilizes  HLD  - continue statin  Dementia  - will need PT/OT evaluation but will likely go to SNF upon discharge  Moderate malnutrition  - secondary to progressive failure to thrive with underlying dementia  - advance diet as pt able to tolerate  Acute renal failure imposed on chronic renal failure stage II - III  - Cr up - stop lisinopril temporarily  - repeat BMP in AM  - stop  IVF due to CHF  Hypokalemia  - supplemented and WNL this AM  Anemia of chronic disease  - Hg stable and at baseline  - no signs of active bleeding  - repeat CBC in AM  DVT prophylaxis  Lovenox SQ while pt is in hospital  Code Status: Full  Family Communication: Pt at bedside  Disposition Plan: Remains inpatient   IV Access:   Peripheral IV Procedures and diagnostic studies:   Dg Chest 2 View 04/05/2014 Persistent CHF.  Medical Consultants:   None Other Consultants:   Physical therapy  Anti-Infectives:   None   Faye Ramsay, MD  [facility] Pager [phone]  If 7PM-7AM, please contact night-coverage [email or web address] Password [email or web address] 04/07/2014, 11:24 AM   LOS: 2 days   HPI/Subjective: No events overnight.   Objective: Filed Vitals:   04/07/14 0800 04/07/14 0910 04/07/14 1012 04/07/14 1123  BP: 193/72 141/58 126/53 127/52  Pulse: 70 67 65 67  Temp: 98.4 F (36.9 C)   98.2 F (36.8 C)  TempSrc: Oral   Oral  Resp: 19 18 16 18   Height:      Weight:      SpO2: 93% 95% 96% 99%    Intake/Output Summary (Last 24 hours) at 04/07/14 1124 Last data filed at 04/07/14 1100  Gross per 24 hour  Intake 1961.67 ml  Output   2990 ml  Net -1028.33 ml    Exam:   General:  Pt is alert, follows  commands appropriately, not in acute distress  Cardiovascular: Regular rate and rhythm,  no rubs, no gallops  Respiratory: Clear to auscultation bilaterally, no wheezing, no crackles, no rhonchi  Abdomen: Soft, non tender, non distended, bowel sounds present, no guarding   Data Reviewed: Basic Metabolic Panel:  Recent Labs Lab 04/05/14 2049 04/05/14 2058 04/06/14 0354 04/07/14 0335  NA 138 138 135* 134*  K 3.3* 3.5* 3.7 3.8  CL 99 96 98 97  CO2 25  --  26 26  GLUCOSE 104* 102* 127* 90  BUN 36* 49* 36* 41*  CREATININE 1.43* 1.60* 1.40* 1.52*  CALCIUM 8.7  --  8.4 8.2*   Liver Function Tests:  Recent Labs Lab 04/05/14 2049  AST 58*  ALT 27  ALKPHOS 74  BILITOT  1.0  PROT 7.4  ALBUMIN 3.2*   CBC:  Recent Labs Lab 04/05/14 2039 04/05/14 2058 04/06/14 0354 04/07/14 0335  WBC 10.6*  --  9.0 6.4  HGB 10.4* 11.9* 10.9* 10.0*  HCT 30.3* 35.0* 31.9* 29.4*  MCV 95.0  --  95.8 95.5  PLT 263  --  236 247   CBG:  Recent Labs Lab 04/06/14 1139 04/06/14 1548 04/06/14 2147 04/07/14 0247 04/07/14 0757  GLUCAP 304* 173* 139* 116* 86    Recent Results (from the past 240 hour(s))  URINE CULTURE     Status: None   Collection Time    04/05/14  8:32 PM      Result Value Ref Range Status   Specimen Description URINE, CLEAN CATCH   Final   Special Requests Normal   Final   Culture  Setup Time     Final   Value: 04/06/2014 10:46     Performed at [place]     Final   Value: NO GROWTH     Performed at [facility]   Culture     Final   Value: NO GROWTH     Performed at [facility]   Report Status 04/07/2014 FINAL   Final  CULTURE, BLOOD (ROUTINE X 2)     Status: None   Collection Time    04/05/14  8:39 PM      Result Value Ref Range Status   Specimen Description BLOOD LEFT ANTECUBITAL   Final   Special Requests BOTTLES DRAWN AEROBIC AND ANAEROBIC [facility] EACH   Final   Culture  Setup Time     Final   Value: 04/06/2014 03:36     Performed at [facility]   Culture     Final   Value:        BLOOD CULTURE RECEIVED NO GROWTH TO DATE CULTURE WILL BE HELD FOR 5 DAYS BEFORE ISSUING A FINAL NEGATIVE REPORT     Performed at [facility]   Report Status PENDING   Incomplete  CULTURE, BLOOD (ROUTINE X 2)     Status: None   Collection Time    04/05/14  8:39 PM      Result Value Ref Range Status   Specimen Description BLOOD LEFT HAND   Final   Special Requests BOTTLES DRAWN AEROBIC AND ANAEROBIC [facility] EACH   Final   Culture  Setup Time     Final   Value: 04/06/2014 03:36     Performed at [facility]   Culture     Final   Value:        BLOOD CULTURE RECEIVED NO GROWTH TO DATE  CULTURE  WILL BE HELD FOR 5 DAYS BEFORE ISSUING A FINAL NEGATIVE REPORT     Performed at [facility]   Report Status PENDING   Incomplete  MRSA PCR SCREENING     Status: None   Collection Time    04/06/14 12:34 AM      Result Value Ref Range Status   MRSA by PCR NEGATIVE  NEGATIVE Final   Comment:            The GeneXpert MRSA Assay (FDA     approved for NASAL specimens     only), is one component of a     comprehensive MRSA colonization     surveillance program. It is not     intended to diagnose MRSA     infection nor to guide or     monitor treatment for     MRSA infections.     Scheduled Meds: . aspirin EC  81 mg Oral QPM  . atorvastatin  20 mg Oral QPM  . carvedilol  6.25 mg Oral BID WC  . enoxaparin (LOVENOX) injection  40 mg Subcutaneous Q24H  . feeding supplement (PRO-STAT SUGAR FREE 64)  30 mL Oral TID WC  . fenofibrate  160 mg Oral QPM  . gabapentin  200 mg Oral BID  . insulin aspart  0-9 Units Subcutaneous TID WC  . isosorbide-hydrALAZINE  1 tablet Oral TID  . latanoprost  1 drop Left Eye QHS  . lisinopril  5 mg Oral BID  . sodium chloride  3 mL Intravenous Q12H  . torsemide  40 mg Oral BID  . traZODone  50 mg Oral QHS   Continuous Infusions:

## 2014-04-07 NOTE — Progress Notes (Signed)
Called brother and daughter to advise of fall. Messages left at numbers in chart no answer. Called niece and talked with her explaining events she was aware of his confusion and medical issues. Advised her of the measures we are taking to prevent another fall. Described injury to hand with skin tear.

## 2014-04-07 NOTE — Progress Notes (Signed)
Clinical Social Work Department BRIEF PSYCHOSOCIAL ASSESSMENT 04/07/2014  Patient:  [name] [name], [name] [name]     Account Number:  [id number]     Admit date:  04/05/2014  Clinical Social Worker:  Ulyess Blossom  Date/Time:  04/07/2014 03:00 PM  Referred by:  Physician  Date Referred:  04/07/2014 Referred for  SNF Placement   Other Referral:   Interview type:  Patient Other interview type:   and patient son, [name] [name]    PSYCHOSOCIAL DATA Living Status:  FACILITY Admitted from facility:  [facility] Level of care:  [place] Primary support name:  [name] [name] Nelson/[phone] Primary support relationship to patient:  CHILD, ADULT Degree of support available:   adequate    CURRENT CONCERNS Current Concerns  Post-Acute Placement   Other Concerns:    SOCIAL WORK ASSESSMENT / PLAN CSW received referral that pt admitted from [facility] and [facility].    CSW visited pt room and introduced self and explained role. Pt confirmed that he was a resident at [facility] stated that he wished that he could go back up, but recognized that he is unable to live alone anymore. Pt agreeable to returning to SNF. CSW inquired with pt about contacting one of pt family members and pt provided permission for CSW to contact pt son, [name] [name].    CSW contacted pt son, [name] [name] via telephone. CSW introduced self and explained role. Pt son confirmed that pt is a resident at [facility]. Pt son stated that he is unsure if he would like for pt to return to [facility] son is interested in knowing if [facility] has any long term care beds available. Pt agreeable to CSW sending pt clinicals to [facility] and once pt son know if [facility] is an option then he will consider the option and make decision about [facility].    CSW completed [facility] [employer] and sent clinicals to [facility]	[place] and [facility].    CSW to follow up with pt son, [name] [name] in regard to responses from SNF.    CSW to continue to follow to assist with pt disposition needs   Assessment/plan status:  Psychosocial Support/Ongoing Assessment of Needs Other assessment/ plan:   discharge planning   Information/referral to community resources:   Referral to [facility] and [facility]    PATIENT'S/FAMILY'S RESPONSE TO PLAN OF CARE: Pt alert and oriented x 2. Pt son, [name] [name] expresses interest in [facility], but does not yet want to make decision between [facility] and [facility]. Pt son weighing options and appreciative of CSW support and assistance.    [name] [name], MSW, [place] Work [phone]

## 2014-04-08 ENCOUNTER — Ambulatory Visit: Payer: PRIVATE HEALTH INSURANCE | Admitting: Internal Medicine

## 2014-04-08 LAB — BASIC METABOLIC PANEL
Anion gap: 12 (ref 5–15)
BUN: 44 mg/dL — ABNORMAL HIGH (ref 6–23)
CHLORIDE: 94 meq/L — AB (ref 96–112)
CO2: 26 mEq/L (ref 19–32)
Calcium: 8.4 mg/dL (ref 8.4–10.5)
Creatinine, Ser: 1.49 mg/dL — ABNORMAL HIGH (ref 0.50–1.35)
GFR, EST AFRICAN AMERICAN: 49 mL/min — AB (ref >90)
GFR, EST NON AFRICAN AMERICAN: 42 mL/min — AB (ref >90)
Glucose, Bld: 146 mg/dL — ABNORMAL HIGH (ref 70–99)
Potassium: 3.8 mEq/L (ref 3.7–5.3)
SODIUM: 132 meq/L — AB (ref 137–147)

## 2014-04-08 LAB — CBC
HCT: 30.1 % — ABNORMAL LOW (ref 39.0–52.0)
Hemoglobin: 10.2 g/dL — ABNORMAL LOW (ref 13.0–17.0)
MCH: 32.5 pg (ref 26.0–34.0)
MCHC: 33.9 g/dL (ref 30.0–36.0)
MCV: 95.9 fL (ref 78.0–100.0)
Platelets: 259 10*3/uL (ref 150–400)
RBC: 3.14 MIL/uL — ABNORMAL LOW (ref 4.22–5.81)
RDW: 13.7 % (ref 11.5–15.5)
WBC: 5.7 10*3/uL (ref 4.0–10.5)

## 2014-04-08 LAB — GLUCOSE, CAPILLARY
GLUCOSE-CAPILLARY: 73 mg/dL (ref 70–99)
Glucose-Capillary: 124 mg/dL — ABNORMAL HIGH (ref 70–99)

## 2014-04-08 LAB — PRO B NATRIURETIC PEPTIDE: PRO B NATRI PEPTIDE: 6535 pg/mL — AB (ref 0–450)

## 2014-04-08 MED ORDER — GLIPIZIDE 5 MG PO TABS: 5.0000 mg | ORAL_TABLET | Freq: Every day | ORAL | Status: DC

## 2014-04-08 NOTE — Progress Notes (Signed)
Patient is set to discharge to [facility] today. Patient & son, [name] [name] aware. Discharge packet in [facility] aware. [facility] called for transport.   Clinical Social Work Department CLINICAL SOCIAL WORK PLACEMENT NOTE 04/08/2014  Patient:  [name] [name], [name] [name]  Account Number:  [id number] Admit date:  04/05/2014  Clinical Social Worker:  Ulyess Blossom  Date/time:  04/07/2014 03:15 PM  Clinical Social Work is seeking post-discharge placement for this patient at the following level of care:   SKILLED NURSING   (*CSW will update this form in Epic as items are completed)   04/07/2014  Patient/family provided with [place] Department of Clinical Social Work's list of facilities offering this level of care within the geographic area requested by the patient (or if unable, by the patient's family).  04/07/2014  Patient/family informed of their freedom to choose among providers that offer the needed level of care, that participate in Medicare, Medicaid or managed care program needed by the patient, have an available bed and are willing to accept the patient.  04/07/2014  Patient/family informed of [facility]' ownership interest in [facility], as well as of the fact that they are under no obligation to receive care at this facility.  PASARR submitted to EDS on 04/07/2014 PASARR number received on 04/07/2014  FL2 transmitted to all facilities in geographic area requested by pt/family on  04/07/2014 FL2 transmitted to all facilities within larger geographic area on   Patient informed that his/her managed care company has contracts with or will negotiate with  certain facilities, including the following:     Patient/family informed of bed offers received:  04/07/2014 Patient chooses bed at [facility] Physician recommends and patient chooses bed at    Patient to be transferred to [facility] on  04/08/2014 Patient to be  transferred to facility by [facility] Patient and family notified of transfer on 04/08/2014 Name of family member notified:  patient's son, [name] [name] via phone  The following physician request were entered in Epic:   Additional Comments:   Raynaldo Opitz, [place] Social Worker cell #: [phone]

## 2014-04-08 NOTE — Discharge Summary (Addendum)
Physician Discharge Summary  [name] [name] TKZ:[id number] DOB: [birth year] DOA: 04/05/2014  PCP: [name], NP  Admit date: 04/05/2014 Discharge date: 04/08/2014  Recommendations for Outpatient Follow-up:  1. Pt will need to follow up with PCP in 1 week post discharge 2. Please obtain BMP to evaluate electrolytes and kidney function 3. Please also check CBC to evaluate Hg and Hct levels 4. Please note that Lisinopril was stopped until renal function stabilizes 5. Please also note A1C is 6 and pt will not need insulin at this time, he was started on Glipizide 5 mg PO QD 6. Please check A1C in 3 months  7. Resume tramadol for pain if renal function stabilizes   Discharge Diagnoses:  Active Problems:   Hypoglycemia   Discharge Condition: Stable  Diet recommendation: Heart healthy diet discussed in details   Brief narrative:  80 yo with history of interstitial lung disease, metapneumovirus PNA in 5/15 with intubation, CKD, ? dementia, and CHF (primarily diastolic) w/ recent admission in July for exacerbation requiring milrinone in therapy who presented from nursing home for persistent hypoglycemia. On record review the patient was on both novolog 10u w lunch and SSI w/ bfast and dinner.   In the ED, he was noted to have glucose in the 32-102 range on D10W. Given persistent hypoglycemia despite IV dextrose, we wew consulted to admit. Cr is slightly above baseline.   Cards - [name] - [name]   Assessment and Plan:   Active Problems:  Hypoglycemia in the setting of uncontrolled diabetes with complications of neuropathy, renal disease  - last known A1C 10 in 2014, repeat A1C this admission 6.1  - would not provide metformin due to renal failure but will place on glipizide only upon discharge  - pt tolerating current diet well  Acute on chronic combined systolic and diastolic CHF  - last 2 D ECHO 02/2014 with EF 45 % and grade I diastolic CHF - stopped IVF  - weight  trend:194 lbs --> 198 lbs --> 197 lbs this AM  HTN, accelerate  - reasonable inpatient control  - continue coreg, Imdur, torsemide, hydralazine  - stopped lisinopril until renal function stabilizes  HLD  - continue statin  Dementia  - SNF upon discharge  Moderate malnutrition  - secondary to progressive failure to thrive with underlying dementia  - advanced diet as pt able to tolerate  Acute renal failure imposed on chronic renal failure stage II - III  - Cr up  - stop lisinopril temporarily  - repeat BMP in AM  - stop IVF due to CHF  Hypokalemia  - supplemented and WNL this AM  Anemia of chronic disease  - Hg stable and at baseline  - no signs of active bleeding   Code Status: DNR Family Communication: Pt at bedside, son over the phone  Disposition Plan: SNF today    IV Access:   Peripheral IV Procedures and diagnostic studies:   Dg Chest 2 View 04/05/2014 Persistent CHF.  Medical Consultants:   None Other Consultants:   Physical therapy  Anti-Infectives:   None  Discharge Exam: Filed Vitals:   04/08/14 0825  BP: 145/43  Pulse: 67  Temp:   Resp:    Filed Vitals:   04/07/14 1421 04/07/14 2019 04/08/14 0431 04/08/14 0825  BP: 131/54 121/60 109/43 145/43  Pulse: 70 68 63 67  Temp: 98 F (36.7 C) 98.5 F (36.9 C) 97.6 F (36.4 C)   TempSrc: Oral Oral Oral   Resp:  18 20 16    Height:      Weight:   89.6 kg (197 lb 8.5 oz)   SpO2: 99% 100% 97%     General: Pt is alert, follows commands appropriately, not in acute distress Cardiovascular: Regular rate and rhythm,  no rubs, no gallops Respiratory: Clear to auscultation bilaterally, no wheezing, no crackles, no rhonchi Abdominal: Soft, non tender, non distended, bowel sounds +, no guarding  Discharge Instructions  Discharge Instructions   Diet - low sodium heart healthy    Complete by:  As directed      Diet - low sodium heart healthy    Complete by:  As directed      Increase activity slowly    Complete  by:  As directed      Increase activity slowly    Complete by:  As directed             Medication List    STOP taking these medications       lisinopril 5 MG tablet  Commonly known as:  PRINIVIL,ZESTRIL     NOVOLOG 100 UNIT/ML injection  Generic drug:  insulin aspart      TAKE these medications       acetaminophen 500 MG tablet  Commonly known as:  TYLENOL  Take 500 mg by mouth every 6 (six) hours as needed for moderate pain.     albuterol 108 (90 BASE) MCG/ACT inhaler  Commonly known as:  PROVENTIL HFA;VENTOLIN HFA  Inhale 2 puffs into the lungs every 4 (four) hours as needed for wheezing or shortness of breath.     albuterol (2.5 MG/3ML) 0.083% nebulizer solution  Commonly known as:  PROVENTIL  Take 2.5 mg by nebulization See admin instructions. Use 1 neb three times a day AND use 1 neb every 12 hours as needed for shortness of breath     aspirin EC 81 MG tablet  Take 81 mg by mouth every evening.     atorvastatin 20 MG tablet  Commonly known as:  LIPITOR  Take 1 tablet (20 mg total) by mouth every evening.     carvedilol 6.25 MG tablet  Commonly known as:  COREG  Take 1 tablet (6.25 mg total) by mouth 2 (two) times daily with a meal.     cholecalciferol 1000 UNITS tablet  Commonly known as:  VITAMIN D  Take 2,000 Units by mouth daily.     feeding supplement (PRO-STAT SUGAR FREE 64) Liqd  Take 30 mLs by mouth 3 (three) times daily with meals.     fenofibrate 160 MG tablet  Take 160 mg by mouth every evening.     gabapentin 100 MG capsule  Commonly known as:  NEURONTIN  Take 200 mg by mouth 2 (two) times daily.     glipiZIDE 5 MG tablet  Commonly known as:  GLUCOTROL  Take 1 tablet (5 mg total) by mouth daily before breakfast.     isosorbide-hydrALAZINE 20-37.5 MG per tablet  Commonly known as:  BIDIL  Take 1 tablet by mouth 3 (three) times daily.     torsemide 20 MG tablet  Commonly known as:  DEMADEX  Take 20-40 mg by mouth as directed. 40 mg  twice daily (scheduled) & 20 mg For weight gain of 3 lbs in 24 hrs. (PRN)        TRAVATAN Z 0.004 % Soln ophthalmic solution  Generic drug:  Travoprost (BAK Free)  Place 1 drop into the left eye at bedtime.  traZODone 50 MG tablet  Commonly known as:  DESYREL  Take 50 mg by mouth at bedtime.           Follow-up Information   Schedule an appointment as soon as possible for a visit with [name], NP.   Specialty:  Nurse Practitioner   Contact information:   [address] [place] [address] [phone]       Follow up with Faye Ramsay, MD. (As needed, If symptoms worsen, call my cell phone [phone])    Specialty:  Internal Medicine   Contact information:   [address] [place] [address] [phone]        The results of significant diagnostics from this hospitalization (including imaging, microbiology, ancillary and laboratory) are listed below for reference.     Microbiology: Recent Results (from the past 240 hour(s))  URINE CULTURE     Status: None   Collection Time    04/05/14  8:32 PM      Result Value Ref Range Status   Specimen Description URINE, CLEAN CATCH   Final   Special Requests Normal   Final   Culture  Setup Time     Final   Value: 04/06/2014 10:46     Performed at [facility]     Final   Value: NO GROWTH     Performed at [facility]   Culture     Final   Value: NO GROWTH     Performed at [facility]   Report Status 04/07/2014 FINAL   Final  CULTURE, BLOOD (ROUTINE X 2)     Status: None   Collection Time    04/05/14  8:39 PM      Result Value Ref Range Status   Specimen Description BLOOD LEFT ANTECUBITAL   Final   Special Requests BOTTLES DRAWN AEROBIC AND ANAEROBIC [facility] EACH   Final   Culture  Setup Time     Final   Value: 04/06/2014 03:36     Performed at [facility]   Culture     Final   Value:        BLOOD CULTURE RECEIVED NO  GROWTH TO DATE CULTURE WILL BE HELD FOR 5 DAYS BEFORE ISSUING A FINAL NEGATIVE REPORT     Performed at [facility]   Report Status PENDING   Incomplete  CULTURE, BLOOD (ROUTINE X 2)     Status: None   Collection Time    04/05/14  8:39 PM      Result Value Ref Range Status   Specimen Description BLOOD LEFT HAND   Final   Special Requests BOTTLES DRAWN AEROBIC AND ANAEROBIC [facility] EACH   Final   Culture  Setup Time     Final   Value: 04/06/2014 03:36     Performed at [facility]   Culture     Final   Value:        BLOOD CULTURE RECEIVED NO GROWTH TO DATE CULTURE WILL BE HELD FOR 5 DAYS BEFORE ISSUING A FINAL NEGATIVE REPORT     Performed at [facility]   Report Status PENDING   Incomplete  MRSA PCR SCREENING     Status: None   Collection Time    04/06/14 12:34 AM      Result Value Ref Range Status   MRSA by PCR NEGATIVE  NEGATIVE Final   Comment:  The GeneXpert MRSA Assay (FDA     approved for NASAL specimens     only), is one component of a     comprehensive MRSA colonization     surveillance program. It is not     intended to diagnose MRSA     infection nor to guide or     monitor treatment for     MRSA infections.     Labs: Basic Metabolic Panel:  Recent Labs Lab 04/05/14 2049 04/05/14 2058 04/06/14 0354 04/07/14 0335 04/08/14 0015  NA 138 138 135* 134* 132*  K 3.3* 3.5* 3.7 3.8 3.8  CL 99 96 98 97 94*  CO2 25  --  26 26 26   GLUCOSE 104* 102* 127* 90 146*  BUN 36* 49* 36* 41* 44*  CREATININE 1.43* 1.60* 1.40* 1.52* 1.49*  CALCIUM 8.7  --  8.4 8.2* 8.4   Liver Function Tests:  Recent Labs Lab 04/05/14 2049  AST 58*  ALT 27  ALKPHOS 74  BILITOT 1.0  PROT 7.4  ALBUMIN 3.2*   CBC:  Recent Labs Lab 04/05/14 2039 04/05/14 2058 04/06/14 0354 04/07/14 0335 04/08/14 0015  WBC 10.6*  --  9.0 6.4 5.7  HGB 10.4* 11.9* 10.9* 10.0* 10.2*  HCT 30.3* 35.0* 31.9* 29.4* 30.1*  MCV 95.0  --  95.8 95.5 95.9  PLT 263  --  236  247 259   BNP: BNP (last 3 results)  Recent Labs  03/06/14 1539 04/07/14 0335 04/08/14 0015  PROBNP 6799.0* 6851.0* 6535.0*   CBG:  Recent Labs Lab 04/07/14 0757 04/07/14 1208 04/07/14 1705 04/07/14 2117 04/08/14 0801  GLUCAP 86 97 122* 120* 73     SIGNED: Time coordinating discharge: Over 30 minutes  Faye Ramsay, MD  [facility] 04/08/2014, 9:33 AM Pager [phone]  If 7PM-7AM, please contact night-coverage [email or web address] Password [email or web address]

## 2014-04-08 NOTE — Discharge Instructions (Signed)
Hypoglycemia °Hypoglycemia occurs when the glucose in your blood is too low. Glucose is a type of sugar that is your body's main energy source. Hormones, such as insulin and glucagon, control the level of glucose in the blood. Insulin lowers blood glucose and glucagon increases blood glucose. Having too much insulin in your blood stream, or not eating enough food containing sugar, can result in hypoglycemia. Hypoglycemia can happen to people with or without diabetes. It can develop quickly and can be a medical emergency.  °CAUSES  °· Missing or delaying meals. °· Not eating enough carbohydrates at meals. °· Taking too much diabetes medicine. °· Not timing your oral diabetes medicine or insulin doses with meals, snacks, and exercise. °· Nausea and vomiting. °· Certain medicines. °· Severe illnesses, such as hepatitis, kidney disorders, and certain eating disorders. °· Increased activity or exercise without eating something extra or adjusting medicines. °· Drinking too much alcohol. °· A nerve disorder that affects body functions like your heart rate, blood pressure, and digestion (autonomic neuropathy). °· A condition where the stomach muscles do not function properly (gastroparesis). Therefore, medicines and food may not absorb properly. °· Rarely, a tumor of the pancreas can produce too much insulin. °SYMPTOMS  °· Hunger. °· Sweating (diaphoresis). °· Change in body temperature. °· Shakiness. °· Headache. °· Anxiety. °· Lightheadedness. °· Irritability. °· Difficulty concentrating. °· Dry mouth. °· Tingling or numbness in the hands or feet. °· Restless sleep or sleep disturbances. °· Altered speech and coordination. °· Change in mental status. °· Seizures or prolonged convulsions. °· Combativeness. °· Drowsiness (lethargic). °· Weakness. °· Increased heart rate or palpitations. °· Confusion. °· Pale, gray skin color. °· Blurred or double vision. °· Fainting. °DIAGNOSIS  °A physical exam and medical history will be  performed. Your caregiver may make a diagnosis based on your symptoms. Blood tests and other lab tests may be performed to confirm a diagnosis. Once the diagnosis is made, your caregiver will see if your signs and symptoms go away once your blood glucose is raised.  °TREATMENT  °Usually, you can easily treat your hypoglycemia when you notice symptoms. °· Check your blood glucose. If it is less than 70 mg/dl, take one of the following:   °¨ 3-4 glucose tablets.   °¨ ½ cup juice.   °¨ ½ cup regular soda.   °¨ 1 cup skim milk.   °¨ ½-1 tube of glucose gel.   °¨ 5-6 hard candies.   °· Avoid high-fat drinks or food that may delay a rise in blood glucose levels. °· Do not take more than the recommended amount of sugary foods, drinks, gel, or tablets. Doing so will cause your blood glucose to go too high.   °· Wait 10-15 minutes and recheck your blood glucose. If it is still less than 70 mg/dl or below your target range, repeat treatment.   °· Eat a snack if it is more than 1 hour until your next meal.   °There may be a time when your blood glucose may go so low that you are unable to treat yourself at home when you start to notice symptoms. You may need someone to help you. You may even faint or be unable to swallow. If you cannot treat yourself, someone will need to bring you to the hospital.  °HOME CARE INSTRUCTIONS °· If you have diabetes, follow your diabetes management plan by: °¨ Taking your medicines as directed. °¨ Following your exercise plan. °¨ Following your meal plan. Do not skip meals. Eat on time. °¨ Testing your blood   glucose regularly. Check your blood glucose before and after exercise. If you exercise longer or different than usual, be sure to check blood glucose more frequently. °¨ Wearing your medical alert jewelry that says you have diabetes. °· Identify the cause of your hypoglycemia. Then, develop ways to prevent the recurrence of hypoglycemia. °· Do not take a hot bath or shower right after an  insulin shot. °· Always carry treatment with you. Glucose tablets are the easiest to carry. °· If you are going to drink alcohol, drink it only with meals. °· Tell friends or family members ways to keep you safe during a seizure. This may include removing hard or sharp objects from the area or turning you on your side. °· Maintain a healthy weight. °SEEK MEDICAL CARE IF:  °· You are having problems keeping your blood glucose in your target range. °· You are having frequent episodes of hypoglycemia. °· You feel you might be having side effects from your medicines. °· You are not sure why your blood glucose is dropping so low. °· You notice a change in vision or a new problem with your vision. °SEEK IMMEDIATE MEDICAL CARE IF:  °· Confusion develops. °· A change in mental status occurs. °· The inability to swallow develops. °· Fainting occurs. °Document Released: 08/01/2005 Document Revised: 08/06/2013 Document Reviewed: 11/28/2011 °ExitCare® Patient Information ©2015 [employer]. This information is not intended to replace advice given to you by your health care provider. Make sure you discuss any questions you have with your health care provider. ° °

## 2014-04-12 LAB — CULTURE, BLOOD (ROUTINE X 2)
CULTURE: NO GROWTH
Culture: NO GROWTH

## 2014-04-15 ENCOUNTER — Inpatient Hospital Stay (HOSPITAL_COMMUNITY): Admission: RE | Admit: 2014-04-15 | Payer: PRIVATE HEALTH INSURANCE | Source: Ambulatory Visit

## 2014-04-16 ENCOUNTER — Ambulatory Visit: Payer: Medicare Other | Admitting: Internal Medicine

## 2014-04-29 ENCOUNTER — Ambulatory Visit: Payer: Medicaid Other | Admitting: Cardiovascular Disease

## 2014-05-20 ENCOUNTER — Encounter: Payer: Self-pay | Admitting: Cardiovascular Disease

## 2014-05-20 ENCOUNTER — Ambulatory Visit (INDEPENDENT_AMBULATORY_CARE_PROVIDER_SITE_OTHER): Payer: PRIVATE HEALTH INSURANCE | Admitting: Cardiovascular Disease

## 2014-05-20 VITALS — BP 136/56 | HR 67 | Ht 69.0 in | Wt 217.1 lb

## 2014-05-20 DIAGNOSIS — I739 Peripheral vascular disease, unspecified: Secondary | ICD-10-CM

## 2014-05-20 NOTE — Assessment & Plan Note (Signed)
The patient has evidence of peripheral arterial disease with significant SFA disease bilaterally. The current ulceration appears to be venous nonetheless and has improved without intervention. He has no claudication or rest pain. Functional capacity is very limited. Continue with leg elevation and treat edema. Continue treatment of risk factors and followup with me in 6 months.

## 2014-05-20 NOTE — Progress Notes (Signed)
Primary cardiologist: Dr. Aundra Dubin  HPI  This is an 80 yo male who is here today for a followup visit regarding peripheral arterial disease. He has chronic medical conditions that include interstitial lung disease, metapneumovirus PNA in 5/15 with intubation, CKD, ?dementia, and chronic systolic/diastolic heart failure   Patient lives in a nursing home currently.  He is very inactive, using a wheelchair to get around.    He was hospitalized on July 23 with massive volume overload. Diuresed with Milrinone and Lasix drip. He underwent Lexiscan nuclear stress test to evaluate for ischemic component of cardiomyopathy which showed EF 43% with no ischemia, inferior hypokinesis with "inferior attenuation."  ABI 7/28: Right: 0.45, Left 0.56 with dampened monophasic waveforms.  He was seen for superficial ulceration on dorsal side of both feet R>L which started more than 1 month ago and has been improving. He denies any pain at rest or with walking. These were suspected to be venous ulcers. Duplex showed significant SFA disease bilaterally with three-vessel runoff. The ulceration has improved significantly and is almost healed although he does have one more ulceration on the right shin.   No Known Allergies   Current Outpatient Prescriptions on File Prior to Visit  Medication Sig Dispense Refill  . acetaminophen (TYLENOL) 500 MG tablet Take 500 mg by mouth every 6 (six) hours as needed for moderate pain.       Marland Kitchen albuterol (PROVENTIL HFA;VENTOLIN HFA) 108 (90 BASE) MCG/ACT inhaler Inhale 2 puffs into the lungs every 4 (four) hours as needed for wheezing or shortness of breath.       Marland Kitchen albuterol (PROVENTIL) (2.5 MG/3ML) 0.083% nebulizer solution Take 2.5 mg by nebulization See admin instructions. Use 1 neb three times a day AND use 1 neb every 12 hours as needed for shortness of breath      . Amino Acids-Protein Hydrolys (FEEDING SUPPLEMENT, PRO-STAT SUGAR FREE 64,) LIQD Take 30 mLs by mouth 3 (three) times  daily with meals.      Marland Kitchen aspirin EC 81 MG tablet Take 81 mg by mouth every evening.       Marland Kitchen atorvastatin (LIPITOR) 20 MG tablet Take 1 tablet (20 mg total) by mouth every evening.      . carvedilol (COREG) 6.25 MG tablet Take 1 tablet (6.25 mg total) by mouth 2 (two) times daily with a meal.      . cholecalciferol (VITAMIN D) 1000 UNITS tablet Take 2,000 Units by mouth daily.       . fenofibrate 160 MG tablet Take 160 mg by mouth every evening.       . gabapentin (NEURONTIN) 100 MG capsule Take 200 mg by mouth 2 (two) times daily.      Marland Kitchen glipiZIDE (GLUCOTROL) 5 MG tablet Take 1 tablet (5 mg total) by mouth daily before breakfast.  30 tablet  0  . isosorbide-hydrALAZINE (BIDIL) 20-37.5 MG per tablet Take 1 tablet by mouth 3 (three) times daily.      Marland Kitchen torsemide (DEMADEX) 20 MG tablet Take 20-40 mg by mouth as directed. 40 mg twice daily (scheduled) & 20 mg For weight gain of 3 lbs in 24 hrs. (PRN)      . TRAVATAN Z 0.004 % SOLN ophthalmic solution Place 1 drop into the left eye at bedtime.       . traZODone (DESYREL) 50 MG tablet Take 50 mg by mouth at bedtime.       No current facility-administered medications on file prior to visit.  Past Medical History  Diagnosis Date  . Hypertension   . Diabetes mellitus     a. Diet-controlled.  . Arthritis   . Chronic diastolic CHF (congestive heart failure)     a. Primarily diastolic CHF & RV dysfunction but systolic dysfunction. 2D Echo 03/07/14 showed EF 40-45%, diffuse HK, grade 1 diastolic dysfunction, mild MR/AI, mod dilated LA/RA, mildly dilated RV, PA pressure 49mHg. Lexiscan nuc 02/2014: EF 43% with no ischemia, inferior hypokinesis with "inferior attenuation." Dr. Aundra Dubin suspected probable prior inferior MI.  Marland Kitchen COPD (chronic obstructive pulmonary disease)   . Hyperlipidemia   . CKD (chronic kidney disease), stage III     renal insuficiency  . Inferior MI     a. Based on nuc results 02/2014, Dr. Aundra Dubin suspected probable prior inferior MI  (EF 43% with no ischemia, inferior hypokinesis with "inferior attenuation").  . Dementia   . PAD (peripheral artery disease)     a. ABI 02/2014: R 0.45, L 0.56. R foot nonhealing ulcers.  . Mild mitral regurgitation   . Mild AI (aortic insufficiency)   . Lung disease     a. Possible interstitial lung disease: Followed by [name]. PFTs (1/15) with FVC 53%, FEV1 46%, ratio 85%, TLC 62%, DLCO 45% (restrictive defect).  . Human metapneumovirus pneumonia     a. Metapneumovirus PNA in 5/15 with acute respiratory failure and intubation. Bilateral transudative pleural effusions.     Past Surgical History  Procedure Laterality Date  . Leg surgery      both legs - unsure of exact operations - from trauma  . Tonsillectomy    . Appendectomy    . Cataract extraction w/phaco Left 09/04/2013    Procedure: CATARACT EXTRACTION PHACO AND INTRAOCULAR LENS PLACEMENT (IOC);  Surgeon: Adonis Brook, MD;  Location: [facility];  Service: Ophthalmology;  Laterality: Left;     Family History  Problem Relation Age of Onset  . Diabetes Father   . Diabetes Mother      History   Social History  . Marital Status: Single    Spouse Name: N/A    Number of Children: N/A  . Years of Education: N/A   Occupational History  . Not on file.   Social History Main Topics  . Smoking status: Former Smoker -- 0.50 packs/day for 70 years    Types: Cigarettes    Quit date: 05/14/2009  . Smokeless tobacco: Never Used  . Alcohol Use: No  . Drug Use: No     Comment: quit marijuana 3-4 years ago  . Sexual Activity: No   Other Topics Concern  . Not on file   Social History Narrative  . No narrative on file     ROS   PHYSICAL EXAM   BP 136/56  Pulse 67  Ht 5\' 9"  (1.753 m)  Wt 217 lb 1.9 oz (98.485 kg)  BMI 32.05 kg/m2  SpO2 98%  Constitutional: He is oriented to person, place, and time. He appears well-developed and well-nourished. No distress.  HENT: No nasal discharge.  Head: Normocephalic and  atraumatic.  Eyes: Pupils are equal and round.  No discharge. Neck: Normal range of motion. Neck supple. No JVD present. No thyromegaly present.  Cardiovascular: Normal rate, regular rhythm, normal heart sounds. Exam reveals no gallop and no friction rub. No murmur heard.  Pulmonary/Chest: Effort normal and breath sounds normal. No stridor. No respiratory distress. He has no wheezes. He has no rales. He exhibits no tenderness.  Abdominal: Soft. Bowel sounds are normal.  He exhibits no distension. There is no tenderness. There is no rebound and no guarding.  Musculoskeletal: Normal range of motion. He exhibits mild edema and no tenderness.  Neurological: He is alert and oriented to person, place, and time. Coordination normal.  Skin: Skin is warm and dry. Stasis dermatitis. He is not diaphoretic. No erythema. No pallor.  Psychiatric: He has a normal mood and affect. His behavior is normal. Judgment and thought content normal.  Vascular: femoral pulses : normal. Distal pulses: not palpable.  Superfacial ulceration on dorsal aspect of right food with skin depigmentation. This seems to have healed . Small healed ulceration on medical side of ankle. Small ulceration on the right shin which appears to be venous.     ASSESSMENT AND PLAN

## 2014-05-20 NOTE — Patient Instructions (Signed)
Your physician wants you to follow-up in: 6 MONTHS with Dr Fletcher Anon. You will receive a reminder letter in the mail two months in advance. If you don't receive a letter, please call our office to schedule the follow-up appointment.

## 2014-06-21 ENCOUNTER — Emergency Department (HOSPITAL_COMMUNITY): Payer: PRIVATE HEALTH INSURANCE

## 2014-06-21 ENCOUNTER — Encounter (HOSPITAL_COMMUNITY): Payer: Self-pay | Admitting: Emergency Medicine

## 2014-06-21 ENCOUNTER — Inpatient Hospital Stay (HOSPITAL_COMMUNITY)
Admission: EM | Admit: 2014-06-21 | Discharge: 2014-06-24 | DRG: 292 | Disposition: A | Discharge status: Home / Self Care | Payer: PRIVATE HEALTH INSURANCE | Attending: Internal Medicine | Admitting: Internal Medicine

## 2014-06-21 DIAGNOSIS — R911 Solitary pulmonary nodule: Secondary | ICD-10-CM

## 2014-06-21 DIAGNOSIS — J849 Interstitial pulmonary disease, unspecified: Secondary | ICD-10-CM | POA: Diagnosis present

## 2014-06-21 DIAGNOSIS — I5043 Acute on chronic combined systolic (congestive) and diastolic (congestive) heart failure: Secondary | ICD-10-CM | POA: Diagnosis present

## 2014-06-21 DIAGNOSIS — N289 Disorder of kidney and ureter, unspecified: Secondary | ICD-10-CM

## 2014-06-21 DIAGNOSIS — J449 Chronic obstructive pulmonary disease, unspecified: Secondary | ICD-10-CM | POA: Diagnosis not present

## 2014-06-21 DIAGNOSIS — J984 Other disorders of lung: Secondary | ICD-10-CM

## 2014-06-21 DIAGNOSIS — J123 Human metapneumovirus pneumonia: Secondary | ICD-10-CM

## 2014-06-21 DIAGNOSIS — Z7982 Long term (current) use of aspirin: Secondary | ICD-10-CM

## 2014-06-21 DIAGNOSIS — E119 Type 2 diabetes mellitus without complications: Secondary | ICD-10-CM

## 2014-06-21 DIAGNOSIS — E162 Hypoglycemia, unspecified: Secondary | ICD-10-CM

## 2014-06-21 DIAGNOSIS — N183 Chronic kidney disease, stage 3 unspecified: Secondary | ICD-10-CM

## 2014-06-21 DIAGNOSIS — I5021 Acute systolic (congestive) heart failure: Secondary | ICD-10-CM

## 2014-06-21 DIAGNOSIS — F0390 Unspecified dementia without behavioral disturbance: Secondary | ICD-10-CM | POA: Diagnosis present

## 2014-06-21 DIAGNOSIS — M1990 Unspecified osteoarthritis, unspecified site: Secondary | ICD-10-CM | POA: Diagnosis not present

## 2014-06-21 DIAGNOSIS — Z833 Family history of diabetes mellitus: Secondary | ICD-10-CM | POA: Diagnosis not present

## 2014-06-21 DIAGNOSIS — I129 Hypertensive chronic kidney disease with stage 1 through stage 4 chronic kidney disease, or unspecified chronic kidney disease: Secondary | ICD-10-CM | POA: Diagnosis present

## 2014-06-21 DIAGNOSIS — N179 Acute kidney failure, unspecified: Secondary | ICD-10-CM | POA: Diagnosis present

## 2014-06-21 DIAGNOSIS — I252 Old myocardial infarction: Secondary | ICD-10-CM

## 2014-06-21 DIAGNOSIS — I5032 Chronic diastolic (congestive) heart failure: Secondary | ICD-10-CM

## 2014-06-21 DIAGNOSIS — R945 Abnormal results of liver function studies: Secondary | ICD-10-CM | POA: Diagnosis not present

## 2014-06-21 DIAGNOSIS — E1143 Type 2 diabetes mellitus with diabetic autonomic (poly)neuropathy: Secondary | ICD-10-CM

## 2014-06-21 DIAGNOSIS — I739 Peripheral vascular disease, unspecified: Secondary | ICD-10-CM

## 2014-06-21 DIAGNOSIS — I10 Essential (primary) hypertension: Secondary | ICD-10-CM

## 2014-06-21 DIAGNOSIS — J90 Pleural effusion, not elsewhere classified: Secondary | ICD-10-CM

## 2014-06-21 DIAGNOSIS — J8410 Pulmonary fibrosis, unspecified: Secondary | ICD-10-CM

## 2014-06-21 DIAGNOSIS — L97909 Non-pressure chronic ulcer of unspecified part of unspecified lower leg with unspecified severity: Secondary | ICD-10-CM | POA: Diagnosis present

## 2014-06-21 DIAGNOSIS — I340 Nonrheumatic mitral (valve) insufficiency: Secondary | ICD-10-CM

## 2014-06-21 DIAGNOSIS — I080 Rheumatic disorders of both mitral and aortic valves: Secondary | ICD-10-CM | POA: Diagnosis present

## 2014-06-21 DIAGNOSIS — L97519 Non-pressure chronic ulcer of other part of right foot with unspecified severity: Secondary | ICD-10-CM

## 2014-06-21 DIAGNOSIS — R0600 Dyspnea, unspecified: Secondary | ICD-10-CM | POA: Diagnosis present

## 2014-06-21 DIAGNOSIS — E860 Dehydration: Secondary | ICD-10-CM

## 2014-06-21 DIAGNOSIS — D649 Anemia, unspecified: Secondary | ICD-10-CM | POA: Diagnosis present

## 2014-06-21 DIAGNOSIS — E785 Hyperlipidemia, unspecified: Secondary | ICD-10-CM | POA: Diagnosis not present

## 2014-06-21 DIAGNOSIS — I509 Heart failure, unspecified: Secondary | ICD-10-CM

## 2014-06-21 DIAGNOSIS — I5033 Acute on chronic diastolic (congestive) heart failure: Secondary | ICD-10-CM

## 2014-06-21 DIAGNOSIS — E118 Type 2 diabetes mellitus with unspecified complications: Secondary | ICD-10-CM | POA: Diagnosis not present

## 2014-06-21 DIAGNOSIS — E871 Hypo-osmolality and hyponatremia: Secondary | ICD-10-CM

## 2014-06-21 DIAGNOSIS — I351 Nonrheumatic aortic (valve) insufficiency: Secondary | ICD-10-CM

## 2014-06-21 LAB — GLUCOSE, CAPILLARY
GLUCOSE-CAPILLARY: 157 mg/dL — AB (ref 70–99)
Glucose-Capillary: 103 mg/dL — ABNORMAL HIGH (ref 70–99)
Glucose-Capillary: 120 mg/dL — ABNORMAL HIGH (ref 70–99)

## 2014-06-21 LAB — CBC WITH DIFFERENTIAL/PLATELET
Basophils Absolute: 0 10*3/uL (ref 0.0–0.1)
Basophils Relative: 1 % (ref 0–1)
EOS ABS: 0.3 10*3/uL (ref 0.0–0.7)
Eosinophils Relative: 7 % — ABNORMAL HIGH (ref 0–5)
HCT: 32.8 % — ABNORMAL LOW (ref 39.0–52.0)
HEMOGLOBIN: 11.1 g/dL — AB (ref 13.0–17.0)
LYMPHS ABS: 1.1 10*3/uL (ref 0.7–4.0)
Lymphocytes Relative: 26 % (ref 12–46)
MCH: 33 pg (ref 26.0–34.0)
MCHC: 33.8 g/dL (ref 30.0–36.0)
MCV: 97.6 fL (ref 78.0–100.0)
Monocytes Absolute: 0.6 10*3/uL (ref 0.1–1.0)
Monocytes Relative: 14 % — ABNORMAL HIGH (ref 3–12)
NEUTROS ABS: 2.2 10*3/uL (ref 1.7–7.7)
Neutrophils Relative %: 52 % (ref 43–77)
PLATELETS: 154 10*3/uL (ref 150–400)
RBC: 3.36 MIL/uL — ABNORMAL LOW (ref 4.22–5.81)
RDW: 14.8 % (ref 11.5–15.5)
WBC: 4.1 10*3/uL (ref 4.0–10.5)

## 2014-06-21 LAB — I-STAT ARTERIAL BLOOD GAS, ED
ACID-BASE EXCESS: 4 mmol/L — AB (ref 0.0–2.0)
BICARBONATE: 29 meq/L — AB (ref 20.0–24.0)
O2 Saturation: 97 %
PO2 ART: 90 mmHg (ref 80.0–100.0)
Patient temperature: 98.6
TCO2: 30 mmol/L (ref 0–100)
pCO2 arterial: 45.6 mmHg — ABNORMAL HIGH (ref 35.0–45.0)
pH, Arterial: 7.411 (ref 7.350–7.450)

## 2014-06-21 LAB — COMPREHENSIVE METABOLIC PANEL
ALK PHOS: 50 U/L (ref 39–117)
ALT: 67 U/L — AB (ref 0–53)
AST: 103 U/L — ABNORMAL HIGH (ref 0–37)
Albumin: 3.3 g/dL — ABNORMAL LOW (ref 3.5–5.2)
Anion gap: 14 (ref 5–15)
BUN: 50 mg/dL — AB (ref 6–23)
CHLORIDE: 97 meq/L (ref 96–112)
CO2: 25 meq/L (ref 19–32)
Calcium: 8.8 mg/dL (ref 8.4–10.5)
Creatinine, Ser: 1.85 mg/dL — ABNORMAL HIGH (ref 0.50–1.35)
GFR calc Af Amer: 38 mL/min — ABNORMAL LOW (ref >90)
GFR, EST NON AFRICAN AMERICAN: 33 mL/min — AB (ref >90)
Glucose, Bld: 63 mg/dL — ABNORMAL LOW (ref 70–99)
POTASSIUM: 3.7 meq/L (ref 3.7–5.3)
SODIUM: 136 meq/L — AB (ref 137–147)
Total Bilirubin: 1.5 mg/dL — ABNORMAL HIGH (ref 0.3–1.2)
Total Protein: 7.3 g/dL (ref 6.0–8.3)

## 2014-06-21 LAB — I-STAT TROPONIN, ED: Troponin i, poc: 0 ng/mL (ref 0.00–0.08)

## 2014-06-21 LAB — CBG MONITORING, ED
GLUCOSE-CAPILLARY: 54 mg/dL — AB (ref 70–99)
GLUCOSE-CAPILLARY: 94 mg/dL (ref 70–99)
Glucose-Capillary: 98 mg/dL (ref 70–99)

## 2014-06-21 LAB — PRO B NATRIURETIC PEPTIDE: PRO B NATRI PEPTIDE: 7262 pg/mL — AB (ref 0–450)

## 2014-06-21 LAB — MRSA PCR SCREENING: MRSA by PCR: NEGATIVE

## 2014-06-21 MED ORDER — ISOSORB DINITRATE-HYDRALAZINE 20-37.5 MG PO TABS
1.0000 | ORAL_TABLET | Freq: Three times a day (TID) | ORAL | Status: DC
  Administered 2014-06-21 – 2014-06-24 (×10): 1 via ORAL
  Filled 2014-06-21 (×12): qty 1

## 2014-06-21 MED ORDER — NITROGLYCERIN 2 % TD OINT
1.0000 [in_us] | TOPICAL_OINTMENT | Freq: Once | TRANSDERMAL | Status: AC
  Administered 2014-06-21: 1 [in_us] via TOPICAL
  Filled 2014-06-21: qty 1

## 2014-06-21 MED ORDER — FUROSEMIDE 10 MG/ML IJ SOLN
80.0000 mg | Freq: Once | INTRAMUSCULAR | Status: AC
  Administered 2014-06-21: 80 mg via INTRAVENOUS
  Filled 2014-06-21: qty 8

## 2014-06-21 MED ORDER — ALBUTEROL SULFATE (2.5 MG/3ML) 0.083% IN NEBU
5.0000 mg | INHALATION_SOLUTION | Freq: Once | RESPIRATORY_TRACT | Status: AC
  Administered 2014-06-21: 5 mg via RESPIRATORY_TRACT
  Filled 2014-06-21: qty 6

## 2014-06-21 MED ORDER — INSULIN ASPART 100 UNIT/ML ~~LOC~~ SOLN
0.0000 [IU] | Freq: Three times a day (TID) | SUBCUTANEOUS | Status: DC
  Administered 2014-06-21: 2 [IU] via SUBCUTANEOUS
  Administered 2014-06-22 – 2014-06-23 (×3): 1 [IU] via SUBCUTANEOUS

## 2014-06-21 MED ORDER — ALBUTEROL SULFATE (2.5 MG/3ML) 0.083% IN NEBU
3.0000 mL | INHALATION_SOLUTION | RESPIRATORY_TRACT | Status: DC | PRN
Start: 2014-06-21 — End: 2014-06-24

## 2014-06-21 MED ORDER — ATORVASTATIN CALCIUM 20 MG PO TABS
20.0000 mg | ORAL_TABLET | Freq: Every evening | ORAL | Status: DC
  Filled 2014-06-21: qty 1

## 2014-06-21 MED ORDER — INFLUENZA VAC SPLIT QUAD 0.5 ML IM SUSY
0.5000 mL | PREFILLED_SYRINGE | INTRAMUSCULAR | Status: AC
  Administered 2014-06-22: 0.5 mL via INTRAMUSCULAR
  Filled 2014-06-21: qty 0.5

## 2014-06-21 MED ORDER — FUROSEMIDE 10 MG/ML IJ SOLN
40.0000 mg | Freq: Two times a day (BID) | INTRAMUSCULAR | Status: DC
  Administered 2014-06-21 – 2014-06-24 (×6): 40 mg via INTRAVENOUS
  Filled 2014-06-21 (×8): qty 4

## 2014-06-21 MED ORDER — ACETAMINOPHEN 500 MG PO TABS: 500.0000 mg | ORAL_TABLET | Freq: Four times a day (QID) | ORAL | Status: DC | PRN

## 2014-06-21 MED ORDER — ENOXAPARIN SODIUM 30 MG/0.3ML ~~LOC~~ SOLN
30.0000 mg | SUBCUTANEOUS | Status: DC
  Filled 2014-06-21: qty 0.3

## 2014-06-21 MED ORDER — ONDANSETRON HCL 4 MG PO TABS: 4.0000 mg | ORAL_TABLET | Freq: Four times a day (QID) | ORAL | Status: DC | PRN

## 2014-06-21 MED ORDER — RIVASTIGMINE 4.6 MG/24HR TD PT24
4.6000 mg | MEDICATED_PATCH | Freq: Every day | TRANSDERMAL | Status: DC
  Administered 2014-06-21 – 2014-06-24 (×4): 4.6 mg via TRANSDERMAL
  Filled 2014-06-21 (×4): qty 1

## 2014-06-21 MED ORDER — ASPIRIN EC 81 MG PO TBEC
81.0000 mg | DELAYED_RELEASE_TABLET | Freq: Every evening | ORAL | Status: DC
  Administered 2014-06-21 – 2014-06-23 (×3): 81 mg via ORAL
  Filled 2014-06-21 (×4): qty 1

## 2014-06-21 MED ORDER — CARVEDILOL 6.25 MG PO TABS
6.2500 mg | ORAL_TABLET | Freq: Two times a day (BID) | ORAL | Status: DC
  Administered 2014-06-21 – 2014-06-24 (×6): 6.25 mg via ORAL
  Filled 2014-06-21 (×8): qty 1

## 2014-06-21 MED ORDER — ENOXAPARIN SODIUM 40 MG/0.4ML ~~LOC~~ SOLN
40.0000 mg | SUBCUTANEOUS | Status: DC
  Administered 2014-06-21: 40 mg via SUBCUTANEOUS
  Filled 2014-06-21: qty 0.4

## 2014-06-21 MED ORDER — GABAPENTIN 100 MG PO CAPS
200.0000 mg | ORAL_CAPSULE | Freq: Two times a day (BID) | ORAL | Status: DC
  Administered 2014-06-21 – 2014-06-24 (×7): 200 mg via ORAL
  Filled 2014-06-21 (×8): qty 2

## 2014-06-21 MED ORDER — ONDANSETRON HCL 4 MG/2ML IJ SOLN: 4.0000 mg | Freq: Four times a day (QID) | INTRAMUSCULAR | Status: DC | PRN

## 2014-06-21 MED ORDER — TRAZODONE HCL 50 MG PO TABS
50.0000 mg | ORAL_TABLET | Freq: Every day | ORAL | Status: DC
  Administered 2014-06-21 – 2014-06-23 (×3): 50 mg via ORAL
  Filled 2014-06-21 (×5): qty 1

## 2014-06-21 MED ORDER — FENOFIBRATE 160 MG PO TABS
160.0000 mg | ORAL_TABLET | Freq: Every evening | ORAL | Status: DC
  Administered 2014-06-21 – 2014-06-23 (×3): 160 mg via ORAL
  Filled 2014-06-21 (×4): qty 1

## 2014-06-21 NOTE — Consult Note (Signed)
WOC wound consult note Reason for Consult: Right LE with edema, intact blisters and open wound. Patient is many years status post traumatic injuries to LEs Wound type: venous insufficiency Pressure Ulcer POA: No Measurement: Numerous intact blisters on the anterior and lateral surfaces, the largest of which measures 2cm x 1.5cm.  The distal pretibial area presents with an open wound measuring 57mc x 1cm with depth not known due to the presence of firmly adherent yellow slough.  One pinhole opening in the 9 o'clock position to this wound is leaking light yellow fluid. Wound bed:As described above. Drainage (amount, consistency, odor) As described above. Periwound:Intact, dry, hairless with evidence of previous wounding and healing (scarring). Intact blisters as noted above. Dressing procedure/placement/frequency: I will provide Nursing orders for the care and protection of the intact fluid-filled blisters (petrolatum gauze) and conservative orders for the open wound (saline continually moist).  Additionally, I will provide for gentle compression to the right LE with ACE over Kerlix roll gauze wrap from toe to knee. Elevation is recommended both while in bed and in chair. If desired, either referral to [facility] or to outpatient wound care center upon discharge could assist with follow-up.  If you agree, please order. [place] nursing team will not follow, but will remain available to this patient, the nursing and medical team.  Please re-consult if needed. Thanks, Maudie Flakes, MSN, RN, [place], [place], [place] [phone])

## 2014-06-21 NOTE — ED Notes (Signed)
Patient given sandwich for CBG

## 2014-06-21 NOTE — H&P (Signed)
[facility] History and Physical  [name] [name] DPO:[id number] DOB: May 04, 1931 DOA: 06/21/2014  Referring physician: EDP PCP: [name], NP   Chief Complaint: leg swelling and dyspnea  HPI: [name] [name] is a 80 y.o. male with PMH of chronic systolic and diastolic CHF, DM, HTN, CKD 3, Dementia, COPD, resident of SNF was sent to the ER with the above complaints, he is a very poor historian due to dementia. He reports progressive dyspnea for few days to weeks and increasing lower ext swelling, in addition he is also having blisters and small ulcers on his legs. In ER, noted to have 1-2plus edema, blisters, elevated BNP and CXR with pulm vascular congestion and edema   Review of Systems: positives bolded  No weight loss, night sweats, Fevers, chills, fatigue.  HEENT:  No headaches, Difficulty swallowing,Tooth/dental problems,Sore throat,  No sneezing, itching, ear ache, nasal congestion, post nasal drip,  Cardio-vascular:  No chest pain, Orthopnea, PND, swelling in lower extremities, anasarca, dizziness, palpitations  GI:  No heartburn, indigestion, abdominal pain, nausea, vomiting, diarrhea, change in bowel habits, loss of appetite  Resp:  shortness of breath with exertion or at rest. No excess mucus, no productive cough, No non-productive cough, No coughing up of blood.No change in color of mucus.No wheezing.No chest wall deformity  Skin:  no rash or lesions.  GU:  no dysuria, change in color of urine, no urgency or frequency. No flank pain.  Musculoskeletal:  No joint pain or swelling. No decreased range of motion. No back pain.  Psych:  No change in mood or affect. No depression or anxiety. No memory loss.   Past Medical History  Diagnosis Date  . Hypertension   . Diabetes mellitus     a. Diet-controlled.  . Arthritis   . Chronic diastolic CHF (congestive heart failure)     a. Primarily diastolic CHF & RV dysfunction but systolic dysfunction. 2D Echo 03/07/14  showed EF 40-45%, diffuse HK, grade 1 diastolic dysfunction, mild MR/AI, mod dilated LA/RA, mildly dilated RV, PA pressure 73mHg. Lexiscan nuc 02/2014: EF 43% with no ischemia, inferior hypokinesis with "inferior attenuation." Dr. Aundra Dubin suspected probable prior inferior MI.  Marland Kitchen COPD (chronic obstructive pulmonary disease)   . Hyperlipidemia   . CKD (chronic kidney disease), stage III     renal insuficiency  . Inferior MI     a. Based on nuc results 02/2014, Dr. Aundra Dubin suspected probable prior inferior MI (EF 43% with no ischemia, inferior hypokinesis with "inferior attenuation").  . Dementia   . PAD (peripheral artery disease)     a. ABI 02/2014: R 0.45, L 0.56. R foot nonhealing ulcers.  . Mild mitral regurgitation   . Mild AI (aortic insufficiency)   . Lung disease     a. Possible interstitial lung disease: Followed by [name]. PFTs (1/15) with FVC 53%, FEV1 46%, ratio 85%, TLC 62%, DLCO 45% (restrictive defect).  . Human metapneumovirus pneumonia     a. Metapneumovirus PNA in 5/15 with acute respiratory failure and intubation. Bilateral transudative pleural effusions.   Past Surgical History  Procedure Laterality Date  . Leg surgery      both legs - unsure of exact operations - from trauma  . Tonsillectomy    . Appendectomy    . Cataract extraction w/phaco Left 09/04/2013    Procedure: CATARACT EXTRACTION PHACO AND INTRAOCULAR LENS PLACEMENT (IOC);  Surgeon: Adonis Brook, MD;  Location: [facility];  Service: Ophthalmology;  Laterality: Left;   Social History:  reports that  he quit smoking about 5 years ago. His smoking use included Cigarettes. He has a 35 pack-year smoking history. He has never used smokeless tobacco. He reports that he does not drink alcohol or use illicit drugs.  No Known Allergies  Family History  Problem Relation Age of Onset  . Diabetes Father   . Diabetes Mother      Prior to Admission medications   Medication Sig Start Date End Date Taking? Authorizing  Provider  acetaminophen (TYLENOL) 500 MG tablet Take 500 mg by mouth every 6 (six) hours as needed for moderate pain.    Yes Historical Provider, MD  albuterol (PROVENTIL HFA;VENTOLIN HFA) 108 (90 BASE) MCG/ACT inhaler Inhale 2 puffs into the lungs every 4 (four) hours as needed for wheezing or shortness of breath.    Yes Historical Provider, MD  albuterol (PROVENTIL) (2.5 MG/3ML) 0.083% nebulizer solution Take 2.5 mg by nebulization 3 (three) times daily.    Yes Historical Provider, MD  aspirin EC 81 MG tablet Take 81 mg by mouth every evening.    Yes Historical Provider, MD  atorvastatin (LIPITOR) 20 MG tablet Take 1 tablet (20 mg total) by mouth every evening. 03/12/14  Yes Dayna N Dunn, PA-C  carvedilol (COREG) 6.25 MG tablet Take 1 tablet (6.25 mg total) by mouth 2 (two) times daily with a meal. 03/12/14  Yes Dayna N Dunn, PA-C  cholecalciferol (VITAMIN D) 1000 UNITS tablet Take 2,000 Units by mouth daily.    Yes Historical Provider, MD  fenofibrate 160 MG tablet Take 160 mg by mouth every evening.    Yes Historical Provider, MD  gabapentin (NEURONTIN) 100 MG capsule Take 200 mg by mouth 2 (two) times daily.   Yes Historical Provider, MD  glipiZIDE (GLUCOTROL) 5 MG tablet Take 1 tablet (5 mg total) by mouth daily before breakfast. 04/08/14  Yes Theodis Blaze, MD  mupirocin ointment (BACTROBAN) 2 % Place 1 application into the nose 2 (two) times daily.   Yes Historical Provider, MD  rivastigmine (EXELON) 4.6 mg/24hr Place 4.6 mg onto the skin daily.   Yes Historical Provider, MD  torsemide (DEMADEX) 20 MG tablet Take 40 mg by mouth 2 (two) times daily.    Yes Historical Provider, MD  TRAVATAN Z 0.004 % SOLN ophthalmic solution Place 1 drop into the left eye at bedtime.  07/04/13  Yes Historical Provider, MD  traZODone (DESYREL) 50 MG tablet Take 50 mg by mouth at bedtime.   Yes Historical Provider, MD  Amino Acids-Protein Hydrolys (FEEDING SUPPLEMENT, PRO-STAT SUGAR FREE 64,) LIQD Take 30 mLs by  mouth 3 (three) times daily with meals.    Historical Provider, MD  isosorbide-hydrALAZINE (BIDIL) 20-37.5 MG per tablet Take 1 tablet by mouth 3 (three) times daily. 03/12/14   Charlie Pitter, PA-C   Physical Exam: Filed Vitals:   06/21/14 0615 06/21/14 0645 06/21/14 0800 06/21/14 0900  BP: 136/103 160/61 155/87 151/71  Pulse: 70 70 71 70  Temp:    97.4 F (36.3 C)  TempSrc:    Oral  Resp: 17 18 16 20   Height:    5\' 7"  (1.702 m)  Weight:    108.773 kg (239 lb 12.8 oz)  SpO2: 100% 100% 100% 92%    Wt Readings from Last 3 Encounters:  06/21/14 108.773 kg (239 lb 12.8 oz)  05/20/14 98.485 kg (217 lb 1.9 oz)  04/08/14 89.6 kg (197 lb 8.5 oz)    General:  Appears calm and comfortable, alert, and awake, oriented to  self, place and partly to time Eyes: PERRL, normal lids, irises & conjunctiva ENT: grossly normal hearing, lips & tongue Neck: no LAD, masses or thyromegaly Cardiovascular: RRR, no m/r/g. No LE edema. Respiratory: CTA bilaterally, no w/r/r. Normal respiratory effort. Abdomen: soft, NT, ND, BS present Skin: no rash or induration seen on limited exam Musculoskeletal: grossly normal tone BUE/BLE, 1-2plus edema with multiple open ulcers and blisters Psychiatric: grossly normal mood and affect, speech fluent and appropriate Neurologic: grossly non-focal.          Labs on Admission:  Basic Metabolic Panel:  Recent Labs Lab 06/21/14 0245  NA 136*  K 3.7  CL 97  CO2 25  GLUCOSE 63*  BUN 50*  CREATININE 1.85*  CALCIUM 8.8   Liver Function Tests:  Recent Labs Lab 06/21/14 0245  AST 103*  ALT 67*  ALKPHOS 50  BILITOT 1.5*  PROT 7.3  ALBUMIN 3.3*   No results for input(s): LIPASE, AMYLASE in the last 168 hours. No results for input(s): AMMONIA in the last 168 hours. CBC:  Recent Labs Lab 06/21/14 0245  WBC 4.1  NEUTROABS 2.2  HGB 11.1*  HCT 32.8*  MCV 97.6  PLT 154   Cardiac Enzymes: No results for input(s): CKTOTAL, CKMB, CKMBINDEX, TROPONINI in  the last 168 hours.  BNP (last 3 results)  Recent Labs  04/07/14 0335 04/08/14 0015 06/21/14 0245  PROBNP 6851.0* 6535.0* 7262.0*   CBG:  Recent Labs Lab 06/21/14 0620 06/21/14 0715 06/21/14 0919 06/21/14 1124  GLUCAP 54* 98 94 103*    Radiological Exams on Admission: Dg Chest Portable 1 View  06/21/2014   CLINICAL DATA:  Shortness of breath.  Bilateral leg swelling.  EXAM: PORTABLE CHEST - 1 VIEW  COMPARISON:  04/05/2014  FINDINGS: Shallow inspiration. Cardiac enlargement with pulmonary vascular congestion and perihilar edema. Moderate size right pleural effusion with basilar atelectasis. Changes are similar to those seen on previous study. No pneumothorax. Calcification of aorta.  IMPRESSION: Cardiac enlargement with pulmonary vascular congestion and perihilar edema. Right pleural effusion with basilar atelectasis.   Electronically Signed   By: Lucienne Capers M.D.   On: 06/21/2014 03:06    EKG: Independently reviewed. NSR, non specific T wave changes  Assessment/Plan  1. Acute on chronic combined systolic and diastolic heart failure -Admit to Tele, IV lasix 40mg  q12 -continue coreg -last ECHO with EF of 40-45% in 7/15, had low risk myoview then -monitor I/Os, Weights and Bmet  2. AKI on CKD -? Cardio-renal syndrome, monitor creatinine closely with diuresis -baseline creatinine 1.5-1.6  3. COPD -stable, nebs PRN   4. DM -hold glipizide, SSI for now  5. Leg ulcers/blisters -due to edema and volume overloaded state -wound RN consult  6. Dementia -stable, continue exelon patch  7. HTN -stable, continue coreg and bidil  8. Elevated LFTs -mild, hold statin, if improves with diuresis could be due to passive hepatic congestion from CHF  Code Status: FUll COde DVT Prophylaxis: lovenox Family Communication: none at bedside Disposition Plan: back to SNF when stable  Time spent: 65min  [place] Coffin [facility] Pager [phone]

## 2014-06-21 NOTE — ED Notes (Signed)
Attempted report 

## 2014-06-21 NOTE — ED Provider Notes (Signed)
CSN: [id number]     Arrival date & time 06/21/14  0136 History   First MD Initiated Contact with Patient 06/21/14 0230     Chief Complaint  Patient presents with  . Leg Swelling    Level 5 Caveat - Dementia  Patient is a 80 y.o. male presenting with shortness of breath. The history is provided by the patient. The history is limited by the condition of the patient.  Shortness of Breath Severity:  Moderate Onset quality:  Gradual Timing:  Constant Progression:  Worsening Chronicity:  Recurrent Relieved by:  None tried Worsened by:  Nothing tried Pt presents from nursing facility for increasing SOB and also noted to have increasing peripheral edema.  This has been reportedly worse over past day   Pt has h/o dementia and is unable to contribute to history, as he answers "yes" to most questions but does not add any further details.   No other details are known at arrival  Past Medical History  Diagnosis Date  . Hypertension   . Diabetes mellitus     a. Diet-controlled.  . Arthritis   . Chronic diastolic CHF (congestive heart failure)     a. Primarily diastolic CHF & RV dysfunction but systolic dysfunction. 2D Echo 03/07/14 showed EF 40-45%, diffuse HK, grade 1 diastolic dysfunction, mild MR/AI, mod dilated LA/RA, mildly dilated RV, PA pressure 1mHg. Lexiscan nuc 02/2014: EF 43% with no ischemia, inferior hypokinesis with "inferior attenuation." Dr. Aundra Dubin suspected probable prior inferior MI.  Marland Kitchen COPD (chronic obstructive pulmonary disease)   . Hyperlipidemia   . CKD (chronic kidney disease), stage III     renal insuficiency  . Inferior MI     a. Based on nuc results 02/2014, Dr. Aundra Dubin suspected probable prior inferior MI (EF 43% with no ischemia, inferior hypokinesis with "inferior attenuation").  . Dementia   . PAD (peripheral artery disease)     a. ABI 02/2014: R 0.45, L 0.56. R foot nonhealing ulcers.  . Mild mitral regurgitation   . Mild AI (aortic insufficiency)   . Lung  disease     a. Possible interstitial lung disease: Followed by [name]. PFTs (1/15) with FVC 53%, FEV1 46%, ratio 85%, TLC 62%, DLCO 45% (restrictive defect).  . Human metapneumovirus pneumonia     a. Metapneumovirus PNA in 5/15 with acute respiratory failure and intubation. Bilateral transudative pleural effusions.   Past Surgical History  Procedure Laterality Date  . Leg surgery      both legs - unsure of exact operations - from trauma  . Tonsillectomy    . Appendectomy    . Cataract extraction w/phaco Left 09/04/2013    Procedure: CATARACT EXTRACTION PHACO AND INTRAOCULAR LENS PLACEMENT (IOC);  Surgeon: Adonis Brook, MD;  Location: [facility];  Service: Ophthalmology;  Laterality: Left;   Family History  Problem Relation Age of Onset  . Diabetes Father   . Diabetes Mother    History  Substance Use Topics  . Smoking status: Former Smoker -- 0.50 packs/day for 70 years    Types: Cigarettes    Quit date: 05/14/2009  . Smokeless tobacco: Never Used  . Alcohol Use: No    Review of Systems  Unable to perform ROS: Dementia  Respiratory: Positive for shortness of breath.       Allergies  Review of patient's allergies indicates no known allergies.  Home Medications   Prior to Admission medications   Medication Sig Start Date End Date Taking? Authorizing Provider  acetaminophen (TYLENOL) 500  MG tablet Take 500 mg by mouth every 6 (six) hours as needed for moderate pain.    Yes Historical Provider, MD  albuterol (PROVENTIL HFA;VENTOLIN HFA) 108 (90 BASE) MCG/ACT inhaler Inhale 2 puffs into the lungs every 4 (four) hours as needed for wheezing or shortness of breath.    Yes Historical Provider, MD  albuterol (PROVENTIL) (2.5 MG/3ML) 0.083% nebulizer solution Take 2.5 mg by nebulization 3 (three) times daily.    Yes Historical Provider, MD  aspirin EC 81 MG tablet Take 81 mg by mouth every evening.    Yes Historical Provider, MD  atorvastatin (LIPITOR) 20 MG tablet Take 1 tablet  (20 mg total) by mouth every evening. 03/12/14  Yes Dayna N Dunn, PA-C  carvedilol (COREG) 6.25 MG tablet Take 1 tablet (6.25 mg total) by mouth 2 (two) times daily with a meal. 03/12/14  Yes Dayna N Dunn, PA-C  cholecalciferol (VITAMIN D) 1000 UNITS tablet Take 2,000 Units by mouth daily.    Yes Historical Provider, MD  fenofibrate 160 MG tablet Take 160 mg by mouth every evening.    Yes Historical Provider, MD  gabapentin (NEURONTIN) 100 MG capsule Take 200 mg by mouth 2 (two) times daily.   Yes Historical Provider, MD  glipiZIDE (GLUCOTROL) 5 MG tablet Take 1 tablet (5 mg total) by mouth daily before breakfast. 04/08/14  Yes Theodis Blaze, MD  mupirocin ointment (BACTROBAN) 2 % Place 1 application into the nose 2 (two) times daily.   Yes Historical Provider, MD  rivastigmine (EXELON) 4.6 mg/24hr Place 4.6 mg onto the skin daily.   Yes Historical Provider, MD  torsemide (DEMADEX) 20 MG tablet Take 40 mg by mouth 2 (two) times daily.    Yes Historical Provider, MD  TRAVATAN Z 0.004 % SOLN ophthalmic solution Place 1 drop into the left eye at bedtime.  07/04/13  Yes Historical Provider, MD  traZODone (DESYREL) 50 MG tablet Take 50 mg by mouth at bedtime.   Yes Historical Provider, MD  Amino Acids-Protein Hydrolys (FEEDING SUPPLEMENT, PRO-STAT SUGAR FREE 64,) LIQD Take 30 mLs by mouth 3 (three) times daily with meals.    Historical Provider, MD  isosorbide-hydrALAZINE (BIDIL) 20-37.5 MG per tablet Take 1 tablet by mouth 3 (three) times daily. 03/12/14   Dayna N Dunn, PA-C   BP 160/57 mmHg  Pulse 67  Temp(Src) 97.5 F (36.4 C) (Oral)  Resp 16  SpO2 99% Physical Exam CONSTITUTIONAL: elderly, frail HEAD: Normocephalic/atraumatic EYES: EOMI/PERRL ENMT: poor dentiion NECK: supple no meningeal signs SPINE:entire spine nontender CV: S1/S2 noted LUNGS: coarse BS noted bilaterally ABDOMEN: soft, nontender, no rebound or guarding GU:no cva tenderness NEURO: Pt is drowsy but arousable and will follow  commands, moves all extremitiesx4 EXTREMITIES: pulses normal, full ROM.  Pt with symmetric edema to bilateral UE.  Equal distal pulses noted.  He also has edema to bilateral LE with scattered wounds to his lower extremities that appear to be chronic.  No active bleeding noted SKIN: warm, color normal PSYCH: no abnormalities of mood noted  ED Course  Procedures   4:25 AM Pt with h/o CHF here with increasing edema and SOB 7:26 AM Per nursing report (our nurse called the nursing home) he has a new nurse who noted pt to be tachypneic as well as increased peripheral edema He does appear to have signs of worsening CHF Suspect acute on chronic CHF He is now more awake/alert.  He feels improved (pt had diuresis with lasix) NTG has also been ordered  BP 160/61 mmHg  Pulse 70  Temp(Src) 98 F (36.7 C) (Oral)  Resp 18  SpO2 100% D/w dr Broadus John, will admit  Labs Review Labs Reviewed  CBC WITH DIFFERENTIAL - Abnormal; Notable for the following:    RBC 3.36 (*)    Hemoglobin 11.1 (*)    HCT 32.8 (*)    Monocytes Relative 14 (*)    Eosinophils Relative 7 (*)    All other components within normal limits  COMPREHENSIVE METABOLIC PANEL - Abnormal; Notable for the following:    Sodium 136 (*)    Glucose, Bld 63 (*)    BUN 50 (*)    Creatinine, Ser 1.85 (*)    Albumin 3.3 (*)    AST 103 (*)    ALT 67 (*)    Total Bilirubin 1.5 (*)    GFR calc non Af Amer 33 (*)    GFR calc Af Amer 38 (*)    All other components within normal limits  PRO B NATRIURETIC PEPTIDE  I-STAT TROPOININ, ED    Imaging Review Dg Chest Portable 1 View  06/21/2014   CLINICAL DATA:  Shortness of breath.  Bilateral leg swelling.  EXAM: PORTABLE CHEST - 1 VIEW  COMPARISON:  04/05/2014  FINDINGS: Shallow inspiration. Cardiac enlargement with pulmonary vascular congestion and perihilar edema. Moderate size right pleural effusion with basilar atelectasis. Changes are similar to those seen on previous study. No pneumothorax.  Calcification of aorta.  IMPRESSION: Cardiac enlargement with pulmonary vascular congestion and perihilar edema. Right pleural effusion with basilar atelectasis.   Electronically Signed   By: Lucienne Capers M.D.   On: 06/21/2014 03:06    Date: 06/21/2014 02:49  Rate: 70  Rhythm: normal sinus rhythm  QRS Axis: normal  Intervals: normal  ST/T Wave abnormalities: nonspecific T wave changes  Conduction Disutrbances:none    MDM   Final diagnoses:  Dyspnea  Acute systolic congestive heart failure  Hypoglycemia  Renal insufficiency    Nursing notes including past medical history and social history reviewed and considered in documentation xrays reviewed and considered Labs/vital reviewed and considered Previous records reviewed and considered     Sharyon Cable, MD 06/21/14 [phone]

## 2014-06-21 NOTE — ED Notes (Signed)
Attempted IV unsuccessful 

## 2014-06-21 NOTE — ED Notes (Signed)
Per PTAR, pt coming from [facility] for evaluation of bilateral leg swelling and R arm and hand swelling.

## 2014-06-21 NOTE — ED Notes (Signed)
Rechecked cbg was 98 after meal.

## 2014-06-22 DIAGNOSIS — I10 Essential (primary) hypertension: Secondary | ICD-10-CM

## 2014-06-22 DIAGNOSIS — J449 Chronic obstructive pulmonary disease, unspecified: Secondary | ICD-10-CM

## 2014-06-22 DIAGNOSIS — I5043 Acute on chronic combined systolic (congestive) and diastolic (congestive) heart failure: Secondary | ICD-10-CM | POA: Diagnosis not present

## 2014-06-22 DIAGNOSIS — F0390 Unspecified dementia without behavioral disturbance: Secondary | ICD-10-CM

## 2014-06-22 DIAGNOSIS — E1143 Type 2 diabetes mellitus with diabetic autonomic (poly)neuropathy: Secondary | ICD-10-CM

## 2014-06-22 LAB — COMPREHENSIVE METABOLIC PANEL
ALK PHOS: 44 U/L (ref 39–117)
ALT: 63 U/L — ABNORMAL HIGH (ref 0–53)
ANION GAP: 16 — AB (ref 5–15)
AST: 106 U/L — ABNORMAL HIGH (ref 0–37)
Albumin: 3 g/dL — ABNORMAL LOW (ref 3.5–5.2)
BUN: 54 mg/dL — AB (ref 6–23)
CO2: 23 mEq/L (ref 19–32)
Calcium: 8.5 mg/dL (ref 8.4–10.5)
Chloride: 100 mEq/L (ref 96–112)
Creatinine, Ser: 1.76 mg/dL — ABNORMAL HIGH (ref 0.50–1.35)
GFR calc non Af Amer: 35 mL/min — ABNORMAL LOW (ref >90)
GFR, EST AFRICAN AMERICAN: 40 mL/min — AB (ref >90)
GLUCOSE: 55 mg/dL — AB (ref 70–99)
POTASSIUM: 4.2 meq/L (ref 3.7–5.3)
Sodium: 139 mEq/L (ref 137–147)
Total Bilirubin: 1.4 mg/dL — ABNORMAL HIGH (ref 0.3–1.2)
Total Protein: 6.5 g/dL (ref 6.0–8.3)

## 2014-06-22 LAB — CBC
HEMATOCRIT: 32.6 % — AB (ref 39.0–52.0)
HEMOGLOBIN: 10.9 g/dL — AB (ref 13.0–17.0)
MCH: 32.8 pg (ref 26.0–34.0)
MCHC: 33.4 g/dL (ref 30.0–36.0)
MCV: 98.2 fL (ref 78.0–100.0)
Platelets: 136 10*3/uL — ABNORMAL LOW (ref 150–400)
RBC: 3.32 MIL/uL — ABNORMAL LOW (ref 4.22–5.81)
RDW: 15 % (ref 11.5–15.5)
WBC: 4.6 10*3/uL (ref 4.0–10.5)

## 2014-06-22 LAB — GLUCOSE, CAPILLARY
GLUCOSE-CAPILLARY: 102 mg/dL — AB (ref 70–99)
GLUCOSE-CAPILLARY: 130 mg/dL — AB (ref 70–99)
Glucose-Capillary: 62 mg/dL — ABNORMAL LOW (ref 70–99)
Glucose-Capillary: 60 mg/dL — ABNORMAL LOW (ref 70–99)
Glucose-Capillary: 95 mg/dL (ref 70–99)
Glucose-Capillary: 138 mg/dL — ABNORMAL HIGH (ref 70–99)

## 2014-06-22 MED ORDER — DEXTROSE 50 % IV SOLN
INTRAVENOUS | Status: AC
Start: 2014-06-22 — End: 2014-06-22
  Administered 2014-06-22: 50 mL
  Filled 2014-06-22: qty 50

## 2014-06-22 MED ORDER — ENOXAPARIN SODIUM 40 MG/0.4ML ~~LOC~~ SOLN
40.0000 mg | SUBCUTANEOUS | Status: DC
  Administered 2014-06-22 – 2014-06-23 (×2): 40 mg via SUBCUTANEOUS
  Filled 2014-06-22 (×3): qty 0.4

## 2014-06-22 NOTE — Progress Notes (Signed)
Patient is sleeping peacefully. Bed alarm on secondary to patient being disoriented to place and time and attempting to climb OOB. Patient maintained on telemetry and is in sinus rhythm. Will continue to monitor.  Esperanza Heir, RN

## 2014-06-22 NOTE — Progress Notes (Signed)
[facility]                                                                              Patient Demographics  [name] [name], is a 80 y.o. male, DOB - April 24, 1931, NAT:[id number]  Admit date - 06/21/2014   Admitting Physician Domenic Polite, MD  Outpatient Primary MD for the patient is [name], NP  LOS - 1   Chief Complaint  Patient presents with  . Leg Swelling      HPI on 06/21/2014 [name] [name] is a 80 y.o. male with PMH of chronic systolic and diastolic CHF, DM, HTN, CKD 3, Dementia, COPD, resident of SNF was sent to the ER with the above complaints, he is a very poor historian due to dementia. He reports progressive dyspnea for few days to weeks and increasing lower ext swelling, in addition he is also having blisters and small ulcers on his legs.  In ER, noted to have 1-2plus edema, blisters, elevated BNP and CXR with pulm vascular congestion and edema  Assessment & Plan   Acute on Chronic Systolic and Diastolic Heart failure -improving -upon admission, BNP was 7262 -Echocardiogram in July 2015: EF of 42-70%, grade 1 diastolic dysfunction -Continue diuresing with Lasix 40 mg IV twice a day -Continue to monitor daily weights, intake and output (urine output 2100 over the last 24 hours) -continue aspirin, Coreg  Chronic kidney disease, stage III -Patient's creatinine and GFR appear at baseline -Will continue to monitor CMP closely  COPD -Currently stable  Diabetes mellitus, type II -Continue sensitive insulin sliding scale with CBG monitoring -Glipizide held  Lower extremity ulcers with blistering -Likely secondary to edema from heart failure -Wound care has been consulted  Dementia -Continue Exelon patch  Hypertension -Continue Coreg and Bidil  Hyperlipidemia -statin held due to elevated LFTs -Continue fenofibrate  Elevated LFTs -Statin currently held -Will continue to monitor CMP  Normocytic anemia -Baseline hemoglobin  10-11 -Currently stable, continue to monitor CBC  Code Status: Full  Family Communication: None at bedside  Disposition Plan: Admitted  Time Spent in minutes   30 minutes  Procedures  None  Consults   None  DVT Prophylaxis  Lovenox  Lab Results  Component Value Date   PLT 136* 06/22/2014    Medications  Scheduled Meds: . aspirin EC  81 mg Oral QPM  . carvedilol  6.25 mg Oral BID WC  . dextrose      . enoxaparin (LOVENOX) injection  30 mg Subcutaneous Q24H  . fenofibrate  160 mg Oral QPM  . furosemide  40 mg Intravenous BID  . gabapentin  200 mg Oral BID  . Influenza vac split quadrivalent PF  0.5 mL Intramuscular Tomorrow-1000  . insulin aspart  0-9 Units Subcutaneous TID WC  . isosorbide-hydrALAZINE  1 tablet Oral TID  . rivastigmine  4.6 mg Transdermal Daily  . traZODone  50 mg Oral QHS   Continuous Infusions:  PRN Meds:.acetaminophen, albuterol, ondansetron **OR** ondansetron (ZOFRAN) IV  Antibiotics    Anti-infectives    None      Subjective:   [name] [name] seen and examined today.  Patient states he is sleepy this morning.  He denies any  pain at this time.   Objective:   Filed Vitals:   06/21/14 1431 06/21/14 2112 06/22/14 0208 06/22/14 0544  BP: 145/74 118/67 162/52 141/74  Pulse: 70 70 63 57  Temp: 97.3 F (36.3 C) 97.3 F (36.3 C) 97.8 F (36.6 C) 97.8 F (36.6 C)  TempSrc: Oral Oral Oral Oral  Resp: 20 20 20 20   Height:      Weight:    109.8 kg (242 lb 1 oz)  SpO2: 99% 100% 100% 100%    Wt Readings from Last 3 Encounters:  06/22/14 109.8 kg (242 lb 1 oz)  05/20/14 98.485 kg (217 lb 1.9 oz)  04/08/14 89.6 kg (197 lb 8.5 oz)     Intake/Output Summary (Last 24 hours) at 06/22/14 7341 Last data filed at 06/22/14 0215  Gross per 24 hour  Intake    480 ml  Output   1700 ml  Net  -1220 ml    Exam  General: Well developed, well nourished, NAD, appears stated age  HEENT: NCAT, PERRLA, EOMI, Anicteic Sclera, mucous membranes  moist.   Cardiovascular: S1 S2 auscultated, no rubs, murmurs or gallops. Regular rate and rhythm.  Respiratory: Clear to auscultation bilaterally with equal chest rise  Abdomen: Soft, obese, nontender, nondistended, + bowel sounds  Extremities: warm dry without cyanosis clubbing. +1 LE edema B/L (scrotal edema)   Neuro: awake and alert, no focal deficits  Skin:Multiple open ulcers/wounds with blisters on LE  Psych: Appropriate mood and affect, dementia  Data Review   Micro Results Recent Results (from the past 240 hour(s))  MRSA PCR Screening     Status: None   Collection Time: 06/21/14 10:22 AM  Result Value Ref Range Status   MRSA by PCR NEGATIVE NEGATIVE Final    Comment:        The GeneXpert MRSA Assay (FDA approved for NASAL specimens only), is one component of a comprehensive MRSA colonization surveillance program. It is not intended to diagnose MRSA infection nor to guide or monitor treatment for MRSA infections.     Radiology Reports Dg Chest Portable 1 View  06/21/2014   CLINICAL DATA:  Shortness of breath.  Bilateral leg swelling.  EXAM: PORTABLE CHEST - 1 VIEW  COMPARISON:  04/05/2014  FINDINGS: Shallow inspiration. Cardiac enlargement with pulmonary vascular congestion and perihilar edema. Moderate size right pleural effusion with basilar atelectasis. Changes are similar to those seen on previous study. No pneumothorax. Calcification of aorta.  IMPRESSION: Cardiac enlargement with pulmonary vascular congestion and perihilar edema. Right pleural effusion with basilar atelectasis.   Electronically Signed   By: Lucienne Capers M.D.   On: 06/21/2014 03:06    CBC  Recent Labs Lab 06/21/14 0245 06/22/14 0451  WBC 4.1 4.6  HGB 11.1* 10.9*  HCT 32.8* 32.6*  PLT 154 136*  MCV 97.6 98.2  MCH 33.0 32.8  MCHC 33.8 33.4  RDW 14.8 15.0  LYMPHSABS 1.1  --   MONOABS 0.6  --   EOSABS 0.3  --   BASOSABS 0.0  --     Chemistries   Recent Labs Lab  06/21/14 0245 06/22/14 0451  NA 136* 139  K 3.7 4.2  CL 97 100  CO2 25 23  GLUCOSE 63* 55*  BUN 50* 54*  CREATININE 1.85* 1.76*  CALCIUM 8.8 8.5  AST 103* 106*  ALT 67* 63*  ALKPHOS 50 44  BILITOT 1.5* 1.4*   ------------------------------------------------------------------------------------------------------------------ estimated creatinine clearance is 38.9 mL/min (by C-G formula based on Cr of 1.76). ------------------------------------------------------------------------------------------------------------------  No results for input(s): HGBA1C in the last 72 hours. ------------------------------------------------------------------------------------------------------------------ No results for input(s): CHOL, HDL, LDLCALC, TRIG, CHOLHDL, LDLDIRECT in the last 72 hours. ------------------------------------------------------------------------------------------------------------------ No results for input(s): TSH, T4TOTAL, T3FREE, THYROIDAB in the last 72 hours.  Invalid input(s): FREET3 ------------------------------------------------------------------------------------------------------------------ No results for input(s): VITAMINB12, FOLATE, FERRITIN, TIBC, IRON, RETICCTPCT in the last 72 hours.  Coagulation profile No results for input(s): INR, PROTIME in the last 168 hours.  No results for input(s): DDIMER in the last 72 hours.  Cardiac Enzymes No results for input(s): CKMB, TROPONINI, MYOGLOBIN in the last 168 hours.  Invalid input(s): CK ------------------------------------------------------------------------------------------------------------------ Invalid input(s): POCBNP    [name] [name] D.O. on 06/22/2014 at 9:37 AM  Between 7am to 7pm - Pager - [phone]  After 7pm go to [email or web address] - password [email or web address]  And look for the night coverage person covering for me after hours  [facility] Office  [phone]

## 2014-06-22 NOTE — Progress Notes (Signed)
Utilization Review Completed.   Amal Renbarger, RN, BSN Nurse Case Manager  

## 2014-06-22 NOTE — Progress Notes (Signed)
Pharmacist Heart Failure Core Measure Documentation  Assessment: [name] [name] has an EF documented as 40-45% on 03/07/14 by ECHO.  Rationale: Heart failure patients with left ventricular systolic dysfunction (LVSD) and an EF < 40% should be prescribed an angiotensin converting enzyme inhibitor (ACEI) or angiotensin receptor blocker (ARB) at discharge unless a contraindication is documented in the medical record.  This patient is not currently on an ACEI or ARB for HF.  This note is being placed in the record in order to provide documentation that a contraindication to the use of these agents is present for this encounter.  ACE Inhibitor or Angiotensin Receptor Blocker is contraindicated (specify all that apply)  []   ACEI allergy AND ARB allergy []   Angioedema []   Moderate or severe aortic stenosis []   Hyperkalemia []   Hypotension []   Renal artery stenosis [x]   Worsening renal function, preexisting renal disease or dysfunction   Jamelia Varano, Tsz-Yin 06/22/2014 12:15 PM

## 2014-06-23 LAB — COMPREHENSIVE METABOLIC PANEL
ALT: 53 U/L (ref 0–53)
AST: 79 U/L — AB (ref 0–37)
Albumin: 2.9 g/dL — ABNORMAL LOW (ref 3.5–5.2)
Alkaline Phosphatase: 42 U/L (ref 39–117)
Anion gap: 12 (ref 5–15)
BUN: 55 mg/dL — AB (ref 6–23)
CALCIUM: 8.5 mg/dL (ref 8.4–10.5)
CO2: 28 meq/L (ref 19–32)
CREATININE: 1.81 mg/dL — AB (ref 0.50–1.35)
Chloride: 100 mEq/L (ref 96–112)
GFR, EST AFRICAN AMERICAN: 39 mL/min — AB (ref >90)
GFR, EST NON AFRICAN AMERICAN: 33 mL/min — AB (ref >90)
GLUCOSE: 90 mg/dL (ref 70–99)
Potassium: 3.7 mEq/L (ref 3.7–5.3)
Sodium: 140 mEq/L (ref 137–147)
Total Bilirubin: 1.4 mg/dL — ABNORMAL HIGH (ref 0.3–1.2)
Total Protein: 6.2 g/dL (ref 6.0–8.3)

## 2014-06-23 LAB — GLUCOSE, CAPILLARY
GLUCOSE-CAPILLARY: 138 mg/dL — AB (ref 70–99)
Glucose-Capillary: 82 mg/dL (ref 70–99)
Glucose-Capillary: 133 mg/dL — ABNORMAL HIGH (ref 70–99)
Glucose-Capillary: 131 mg/dL — ABNORMAL HIGH (ref 70–99)

## 2014-06-23 NOTE — Plan of Care (Signed)
Problem: Phase III Progression Outcomes Goal: Tolerating diet Outcome: Completed/Met Date Met:  06/23/14     

## 2014-06-23 NOTE — Evaluation (Signed)
Physical Therapy Evaluation Patient Details Name: [name] [name] MRN: [id number] DOB: 01-28-1931 Today's Date: 06/23/2014   History of Present Illness  Pt adm from SNF with acute on chronic heart failure. Pt with history of DM, HTN, CKD 3, Dementia, COPD  Clinical Impression  Pt admitted with above. Pt currently with functional limitations due to the deficits listed below (see PT Problem List).  Pt will benefit from skilled PT to increase their independence and safety with mobility to allow discharge to the venue listed below.       Follow Up Recommendations SNF    Equipment Recommendations  None recommended by PT    Recommendations for Other Services       Precautions / Restrictions Precautions Precautions: Fall      Mobility  Bed Mobility Overal bed mobility: Needs Assistance Bed Mobility: Supine to Sit     Supine to sit: Mod assist     General bed mobility comments: Assist to elevate trunk.  Transfers Overall transfer level: Needs assistance Equipment used: Rolling walker (2 wheeled) Transfers: Sit to/from Stand Sit to Stand: Mod assist;Min assist         General transfer comment: Verbal cues for hand placement. Initial sit to stand from bed required mod A to bring hips up but on next attempt required only min A.  Ambulation/Gait Ambulation/Gait assistance: Min assist Ambulation Distance (Feet): 8 Feet Assistive device: Rolling walker (2 wheeled) Gait Pattern/deviations: Step-through pattern;Decreased step length - right;Decreased step length - left;Shuffle;Trunk flexed Gait velocity: decr Gait velocity interpretation: Below normal speed for age/gender General Gait Details: Verbal cues to stand more erect and assist to bring hips up.  Stairs            Wheelchair Mobility    Modified Rankin (Stroke Patients Only)       Balance Overall balance assessment: Needs assistance Sitting-balance support: No upper extremity supported;Feet  supported Sitting balance-Leahy Scale: Good     Standing balance support: Bilateral upper extremity supported Standing balance-Leahy Scale: Poor Standing balance comment: Walker and min A for static standing.                             Pertinent Vitals/Pain Pain Assessment: No/denies pain    Home Living Family/patient expects to be discharged to:: Skilled nursing facility                      Prior Function Level of Independence: Needs assistance   Gait / Transfers Assistance Needed: Unsure of how rar or how much assistance pt needed for amb.           Hand Dominance   Dominant Hand: Right    Extremity/Trunk Assessment   Upper Extremity Assessment: Generalized weakness           Lower Extremity Assessment: Generalized weakness         Communication   Communication: No difficulties  Cognition Arousal/Alertness: Awake/alert Behavior During Therapy: WFL for tasks assessed/performed Overall Cognitive Status: No family/caregiver present to determine baseline cognitive functioning                      General Comments      Exercises        Assessment/Plan    PT Assessment Patient needs continued PT services  PT Diagnosis Difficulty walking;Generalized weakness   PT Problem List Decreased strength;Decreased activity tolerance;Decreased balance;Decreased mobility;Decreased knowledge of use of DME;Decreased knowledge  of precautions  PT Treatment Interventions DME instruction;Balance training;Gait training;Patient/family education;Functional mobility training;Therapeutic activities;Therapeutic exercise   PT Goals (Current goals can be found in the Care Plan section) Acute Rehab PT Goals Patient Stated Goal: Not stated but agreeable to work on incr mobility PT Goal Formulation: With patient Time For Goal Achievement: 06/30/14 Potential to Achieve Goals: Good    Frequency Min 2X/week   Barriers to discharge         Co-evaluation               End of Session Equipment Utilized During Treatment: Gait belt Activity Tolerance: Patient tolerated treatment well Patient left: in chair;with call bell/phone within reach;with chair alarm set Nurse Communication: Mobility status         Time: 1134-1201 PT Time Calculation (min): 27 min   Charges:   PT Evaluation $Initial PT Evaluation Tier I: 1 Procedure PT Treatments $Gait Training: 23-37 mins   PT G Codes:          Max Nuno 07-17-14, 2:11 PM  [facility] PT [phone]

## 2014-06-23 NOTE — Progress Notes (Signed)
[facility]                                                                              Patient Demographics  [name] [name], is a 80 y.o. male, DOB - [birth year], GGE:[id number]  Admit date - 06/21/2014   Admitting Physician Domenic Polite, MD  Outpatient Primary MD for the patient is [name], NP  LOS - 2   Chief Complaint  Patient presents with  . Leg Swelling      HPI on 06/21/2014 [name] [name] is a 80 y.o. male with PMH of chronic systolic and diastolic CHF, DM, HTN, CKD 3, Dementia, COPD, resident of SNF was sent to the ER with the above complaints, he is a very poor historian due to dementia. He reports progressive dyspnea for few days to weeks and increasing lower ext swelling, in addition he is also having blisters and small ulcers on his legs.  In ER, noted to have 1-2plus edema, blisters, elevated BNP and CXR with pulm vascular congestion and edema  Assessment & Plan   Acute on Chronic Systolic and Diastolic Heart failure -improving -upon admission, BNP was 7262 -Echocardiogram in July 2015: EF of 46-50%, grade 1 diastolic dysfunction -Continue diuresing with Lasix 40 mg IV twice a day -Continue to monitor daily weights, intake and output (urine output 1100 over the last 24 hours) -continue aspirin, Coreg  Chronic kidney disease, stage III -Patient's creatinine and GFR appear at baseline -Will continue to monitor CMP closely  COPD -Currently stable  Diabetes mellitus, type II -Continue sensitive insulin sliding scale with CBG monitoring -Glipizide held  Lower extremity ulcers with blistering -Likely secondary to edema from heart failure -Wound care has been consulted  Dementia -Continue Exelon patch  Hypertension -Continue Coreg and Bidil  Hyperlipidemia -statin held due to elevated LFTs -Continue fenofibrate  Elevated LFTs -Improving -Statin currently held -Will continue to monitor CMP  Normocytic anemia -Baseline hemoglobin  10-11 -Currently stable, continue to monitor CBC  Code Status: Full  Family Communication: None at bedside  Disposition Plan: Admitted  Time Spent in minutes   30 minutes  Procedures  None  Consults   None  DVT Prophylaxis  Lovenox  Lab Results  Component Value Date   PLT 136* 06/22/2014    Medications  Scheduled Meds: . aspirin EC  81 mg Oral QPM  . carvedilol  6.25 mg Oral BID WC  . enoxaparin (LOVENOX) injection  40 mg Subcutaneous Q24H  . fenofibrate  160 mg Oral QPM  . furosemide  40 mg Intravenous BID  . gabapentin  200 mg Oral BID  . insulin aspart  0-9 Units Subcutaneous TID WC  . isosorbide-hydrALAZINE  1 tablet Oral TID  . rivastigmine  4.6 mg Transdermal Daily  . traZODone  50 mg Oral QHS   Continuous Infusions:  PRN Meds:.acetaminophen, albuterol, ondansetron **OR** ondansetron (ZOFRAN) IV  Antibiotics    Anti-infectives    None      Subjective:   Nation Fleer seen and examined today.  Patient states he is feeling better, but not back to his baseline.  He has some shortness of breath, but feels it has improved.  He denies any pain,  CP, abdominal pain.   Patient does have dementia at baseline.   Objective:   Filed Vitals:   06/22/14 0544 06/22/14 1434 06/22/14 2207 06/23/14 0624  BP: 141/74 124/69 118/71 149/50  Pulse: 57 67 66 63  Temp: 97.8 F (36.6 C) 98.1 F (36.7 C) 97.9 F (36.6 C) 97.4 F (36.3 C)  TempSrc: Oral Oral Oral Oral  Resp: 20 20 18 17   Height:      Weight: 109.8 kg (242 lb 1 oz)   110.1 kg (242 lb 11.6 oz)  SpO2: 100% 94% 98% 100%    Wt Readings from Last 3 Encounters:  06/23/14 110.1 kg (242 lb 11.6 oz)  05/20/14 98.485 kg (217 lb 1.9 oz)  04/08/14 89.6 kg (197 lb 8.5 oz)     Intake/Output Summary (Last 24 hours) at 06/23/14 1053 Last data filed at 06/23/14 0906  Gross per 24 hour  Intake   1520 ml  Output   1526 ml  Net     -6 ml    Exam  General: Well developed, well nourished, NAD, appears stated  age  HEENT: NCAT, mucous membranes moist.   Cardiovascular: S1 S2 auscultated, Regular rate and rhythm.  Respiratory: Diminished, however Clear to auscultation bilaterally with equal chest rise  Abdomen: Soft, obese, nontender, nondistended, + bowel sounds  Extremities: warm dry without cyanosis clubbing. +1 LE edema B/L (scrotal edema), improving   Neuro: awake and alert, no focal deficits  Skin:Multiple open ulcers/wounds with blisters on LE  Psych: Appropriate mood and affect, dementia  Data Review   Micro Results Recent Results (from the past 240 hour(s))  MRSA PCR Screening     Status: None   Collection Time: 06/21/14 10:22 AM  Result Value Ref Range Status   MRSA by PCR NEGATIVE NEGATIVE Final    Comment:        The GeneXpert MRSA Assay (FDA approved for NASAL specimens only), is one component of a comprehensive MRSA colonization surveillance program. It is not intended to diagnose MRSA infection nor to guide or monitor treatment for MRSA infections.     Radiology Reports Dg Chest Portable 1 View  06/21/2014   CLINICAL DATA:  Shortness of breath.  Bilateral leg swelling.  EXAM: PORTABLE CHEST - 1 VIEW  COMPARISON:  04/05/2014  FINDINGS: Shallow inspiration. Cardiac enlargement with pulmonary vascular congestion and perihilar edema. Moderate size right pleural effusion with basilar atelectasis. Changes are similar to those seen on previous study. No pneumothorax. Calcification of aorta.  IMPRESSION: Cardiac enlargement with pulmonary vascular congestion and perihilar edema. Right pleural effusion with basilar atelectasis.   Electronically Signed   By: Lucienne Capers M.D.   On: 06/21/2014 03:06    CBC  Recent Labs Lab 06/21/14 0245 06/22/14 0451  WBC 4.1 4.6  HGB 11.1* 10.9*  HCT 32.8* 32.6*  PLT 154 136*  MCV 97.6 98.2  MCH 33.0 32.8  MCHC 33.8 33.4  RDW 14.8 15.0  LYMPHSABS 1.1  --   MONOABS 0.6  --   EOSABS 0.3  --   BASOSABS 0.0  --      Chemistries   Recent Labs Lab 06/21/14 0245 06/22/14 0451 06/23/14 0357  NA 136* 139 140  K 3.7 4.2 3.7  CL 97 100 100  CO2 25 23 28   GLUCOSE 63* 55* 90  BUN 50* 54* 55*  CREATININE 1.85* 1.76* 1.81*  CALCIUM 8.8 8.5 8.5  AST 103* 106* 79*  ALT 67* 63* 53  ALKPHOS 50 44 42  BILITOT 1.5* 1.4* 1.4*   ------------------------------------------------------------------------------------------------------------------ estimated creatinine clearance is 37.9 mL/min (by C-G formula based on Cr of 1.81). ------------------------------------------------------------------------------------------------------------------ No results for input(s): HGBA1C in the last 72 hours. ------------------------------------------------------------------------------------------------------------------ No results for input(s): CHOL, HDL, LDLCALC, TRIG, CHOLHDL, LDLDIRECT in the last 72 hours. ------------------------------------------------------------------------------------------------------------------ No results for input(s): TSH, T4TOTAL, T3FREE, THYROIDAB in the last 72 hours.  Invalid input(s): FREET3 ------------------------------------------------------------------------------------------------------------------ No results for input(s): VITAMINB12, FOLATE, FERRITIN, TIBC, IRON, RETICCTPCT in the last 72 hours.  Coagulation profile No results for input(s): INR, PROTIME in the last 168 hours.  No results for input(s): DDIMER in the last 72 hours.  Cardiac Enzymes No results for input(s): CKMB, TROPONINI, MYOGLOBIN in the last 168 hours.  Invalid input(s): CK ------------------------------------------------------------------------------------------------------------------ Invalid input(s): POCBNP    Robyne Matar D.O. on 06/23/2014 at 10:53 AM  Between 7am to 7pm - Pager - [phone]  After 7pm go to [email or web address] - password [email or web address]  And look for the night coverage person covering  for me after hours  [facility] Office  [phone]

## 2014-06-23 NOTE — Clinical Social Work Psychosocial (Signed)
Clinical Social Work Department BRIEF PSYCHOSOCIAL ASSESSMENT 06/23/2014  Patient:  [name] [name], [name] [name]     Account Number:  [id number]     Admit date:  06/21/2014  Clinical Social Worker:  Elam Dutch  Date/Time:  06/23/2014 03:54 PM  Referred by:  Physician  Date Referred:  06/23/2014 Referred for  Psychosocial assessment   Other Referral:   Interview type:  Patient Other interview type:   SW and SW Intern talked with PT.    PSYCHOSOCIAL DATA Living Status:  FACILITY Admitted from facility:  [facility] Level of care:  [place] Primary support name:  [name] [name]- friend care taker Primary support relationship to patient:  CHILD, ADULT Degree of support available:   The family and care taker is supportive and aware the the pt will return to [facility] when he is ready for discarge. The son is the power of attorney.    CURRENT CONCERNS Current Concerns  Other - See comment   Other Concerns:   return to SNF    SOCIAL WORK ASSESSMENT / PLAN SW and SW intern talked with pt about returning to [facility] when he is ready for discharge. Pt is agreeable to return to [facility]. SW called [name] [name] who is a care taker for [name] and she is aware and agreeable for discharge back to [facility] care. SW has contacted [name] [name] (RN, SNF liaison) for [facility] and is agreeable for him to return to the facility when medically stable. FL2 is on chart for MD signature.   Assessment/plan status:  Psychosocial Support/Ongoing Assessment of Needs Other assessment/ plan:   Information/referral to community resources:   None at this time    PATIENT'S/FAMILY'S RESPONSE TO PLAN OF CARE: Pt and family are aware and agreeable for discharge back to [name] care. Patient is alert and nice. He was cooperative with SW during visit.  [place], [place]

## 2014-06-23 NOTE — Clinical Social Work Psychosocial (Signed)
     Clinical Social Work Department BRIEF PSYCHOSOCIAL ASSESSMENT 06/23/2014  Patient:  [name] [name], [name] [name]     Account Number:  [id number]     Admit date:  06/21/2014  Clinical Social Worker:  [place], CLINICAL SOCIAL WORKER  Date/Time:  06/23/2014 03:54 PM  Referred by:  Physician  Date Referred:  06/23/2014 Referred for  Psychosocial assessment   Other Referral:   Interview type:  Patient Other interview type:   SW and SW Intern talked with PT.    PSYCHOSOCIAL DATA Living Status:  FACILITY Admitted from facility:  [facility] Level of care:  [place] Primary support name:  [name] [name]- friend care taker Primary support relationship to patient:  CHILD, ADULT Degree of support available:   The family and care taker is supportive and aware the the pt will return to [name] when he is ready for discarge. The son is the power of attorney.    CURRENT CONCERNS Current Concerns  Other - See comment   Other Concerns:   return to SNF    SOCIAL WORK ASSESSMENT / PLAN SW and SW intern talked with pt about returning to [facility] when he is ready for discharge. Pt is agreeable to return to [facility]. SW called [name] [name] who is a care taker for [name] and she is aware and agreeable for discharge back to [place] care. SW has contacted [name] [name] (RN, SNF liaison) for [name] and is agreeable for him to return to the facility when medically stable. FL2 is on chart for MD signature.   Assessment/plan status:  Psychosocial Support/Ongoing Assessment of Needs Other assessment/ plan:   Information/referral to community resources:   None at this time    PATIENTS/FAMILYS RESPONSE TO PLAN OF CARE: Pt and family are aware and agreeable for discharge back to [name] care. Patient is alert and nice. He was cooperative with SW during visit.  [place],  [place]

## 2014-06-24 DIAGNOSIS — E1143 Type 2 diabetes mellitus with diabetic autonomic (poly)neuropathy: Secondary | ICD-10-CM | POA: Insufficient documentation

## 2014-06-24 DIAGNOSIS — J449 Chronic obstructive pulmonary disease, unspecified: Secondary | ICD-10-CM | POA: Insufficient documentation

## 2014-06-24 DIAGNOSIS — I10 Essential (primary) hypertension: Secondary | ICD-10-CM | POA: Insufficient documentation

## 2014-06-24 DIAGNOSIS — E785 Hyperlipidemia, unspecified: Secondary | ICD-10-CM

## 2014-06-24 LAB — COMPREHENSIVE METABOLIC PANEL
ALT: 48 U/L (ref 0–53)
AST: 70 U/L — ABNORMAL HIGH (ref 0–37)
Albumin: 3.1 g/dL — ABNORMAL LOW (ref 3.5–5.2)
Alkaline Phosphatase: 40 U/L (ref 39–117)
Anion gap: 12 (ref 5–15)
BILIRUBIN TOTAL: 1.7 mg/dL — AB (ref 0.3–1.2)
BUN: 52 mg/dL — ABNORMAL HIGH (ref 6–23)
CHLORIDE: 99 meq/L (ref 96–112)
CO2: 27 meq/L (ref 19–32)
CREATININE: 1.8 mg/dL — AB (ref 0.50–1.35)
Calcium: 8.6 mg/dL (ref 8.4–10.5)
GFR calc Af Amer: 39 mL/min — ABNORMAL LOW (ref >90)
GFR, EST NON AFRICAN AMERICAN: 34 mL/min — AB (ref >90)
Glucose, Bld: 90 mg/dL (ref 70–99)
Potassium: 3.7 mEq/L (ref 3.7–5.3)
Sodium: 138 mEq/L (ref 137–147)
Total Protein: 6.4 g/dL (ref 6.0–8.3)

## 2014-06-24 LAB — GLUCOSE, CAPILLARY
GLUCOSE-CAPILLARY: 82 mg/dL (ref 70–99)
GLUCOSE-CAPILLARY: 103 mg/dL — AB (ref 70–99)
Glucose-Capillary: 69 mg/dL — ABNORMAL LOW (ref 70–99)

## 2014-06-24 LAB — CBC
HCT: 32.6 % — ABNORMAL LOW (ref 39.0–52.0)
HEMOGLOBIN: 11 g/dL — AB (ref 13.0–17.0)
MCH: 32.7 pg (ref 26.0–34.0)
MCHC: 33.7 g/dL (ref 30.0–36.0)
MCV: 97 fL (ref 78.0–100.0)
Platelets: 143 10*3/uL — ABNORMAL LOW (ref 150–400)
RBC: 3.36 MIL/uL — AB (ref 4.22–5.81)
RDW: 14.8 % (ref 11.5–15.5)
WBC: 4.2 10*3/uL (ref 4.0–10.5)

## 2014-06-24 MED ORDER — ATORVASTATIN CALCIUM 20 MG PO TABS: 20.0000 mg | ORAL_TABLET | Freq: Every evening | ORAL | Status: AC

## 2014-06-24 NOTE — Plan of Care (Signed)
Problem: Discharge Progression Outcomes Goal: Hemodynamically stable Outcome: Completed/Met Date Met:  06/24/14     

## 2014-06-24 NOTE — Plan of Care (Signed)
Problem: Discharge Progression Outcomes Goal: Barriers To Progression Addressed/Resolved Outcome: Completed/Met Date Met:  06/24/14

## 2014-06-24 NOTE — Progress Notes (Signed)
Pt d/c at 1340 to [facility] via ambulance.  Attempted to call report unable to get response.  Karie Kirks, Therapist, sports.

## 2014-06-24 NOTE — Plan of Care (Signed)
Problem: Discharge Progression Outcomes Goal: Tolerating diet Outcome: Completed/Met Date Met:  06/24/14     

## 2014-06-24 NOTE — Progress Notes (Signed)
Patient had ~20 seconds of Atrial flutter tonight around 0045.  It went back to NSR before EKG could be done.

## 2014-06-24 NOTE — Plan of Care (Signed)
Problem: Discharge Progression Outcomes Goal: Pain controlled with appropriate interventions Outcome: Completed/Met Date Met:  06/24/14     

## 2014-06-24 NOTE — Plan of Care (Signed)
Problem: Discharge Progression Outcomes Goal: Discharge plan in place and appropriate Outcome: Completed/Met Date Met:  06/24/14

## 2014-06-24 NOTE — Progress Notes (Signed)
Clinical Social Worker facilitated patient discharge including contacting patient family (messages left for [employer] and Daeshaun Specht- Son)  and facility to confirm patient discharge plans.  Clinical information faxed to [facility] and he is agreeable with plan. CSW arranged ambulance transport via [facility] to [facility].  RN to call report prior to discharge.  Clinical Social Worker will sign off for now as social work intervention is no longer needed. Please consult [place] again if new need arises.  Lorie Phenix. Pauline Good, [place]

## 2014-06-24 NOTE — Discharge Summary (Signed)
Physician Discharge Summary  [name] [name] ATF:[id number] DOB: 19-Dec-1930 DOA: 06/21/2014  PCP: [name] [name]  Admit date: 06/21/2014 Discharge date: 06/24/2014  Time spent: 45 minutes  Recommendations for Outpatient Follow-up:  Patient will be discharged to [facility] He is continue his medications as prescribed. Patient should follow-up with his primary care physician within one week of discharge and have repeat CMP. Patient should follow a heart healthy/carb modified diet with 1200 mL fluid restriction per day.  Discharge Diagnoses:  Acute on chronic systolic and diastolic heart failure Chronic kidney disease, stage III COPD Diabetes mellitus, type II Lower extremity ulcers with blistering Hypertension Dementia Hyperlipidemia  Elevated LFTs Normocytic anemia  Discharge Condition: stable  Diet recommendation:  Heart healthy/carb modified with 1200 mL fluid restriction per day  Filed Weights   06/22/14 0544 06/23/14 0624 06/24/14 2706  Weight: 109.8 kg (242 lb 1 oz) 110.1 kg (242 lb 11.6 oz) 110.9 kg (244 lb 7.8 oz)    History of present illness:  on 06/21/2014 [name] [name] is a 80 y.o. male with PMH of chronic systolic and diastolic CHF, DM, HTN, CKD 3, Dementia, COPD, resident of SNF was sent to the ER with the above complaints, he is a very poor historian due to dementia. He reports progressive dyspnea for few days to weeks and increasing lower ext swelling, in addition he is also having blisters and small ulcers on his legs. In ER, noted to have 1-2plus edema, blisters, elevated BNP and CXR with pulm vascular congestion and edema  Hospital Course:  Acute on Chronic Systolic and Diastolic Heart failure -improving -upon admission, BNP was 7262 -Echocardiogram in July 2015: EF of 23-76%, grade 1 diastolic dysfunction -Was placed Lasix 40 mg IV twice a day with good UOP.  Patient is to continue toresmide upon discharge -Continue to monitor daily  weights, intake and output -continue aspirin, Coreg  Chronic kidney disease, stage III -Patient's creatinine and GFR appear at baseline  COPD -Currently stable  Diabetes mellitus, type II -Continue sensitive insulin sliding scale with CBG monitoring -Glipizide held during hospital course, but may be continued at discharge -Last HbA1c 6.1 (04/06/2014)  Lower extremity ulcers with blistering -Likely secondary to edema from heart failure -Wound care was consulted  Dementia -Continue Exelon patch  Hypertension -Continue Coreg and Bidil  Hyperlipidemia -statin held due to elevated LFTs -Continue fenofibrate  Elevated LFTs -Improving -Statin currently held during hospital course -Would recommend repeat CMP in one week before restarting statin  Normocytic anemia -Baseline hemoglobin 10-11 -Hb remains stable  Procedures: none  Consultations: none  Discharge Exam: Filed Vitals:   06/24/14 0638  BP: 148/51  Pulse: 63  Temp: 97.3 F (36.3 C)  Resp: 17     General: Well developed, well nourished, NAD, appears stated age  HEENT: NCAT, mucous membranes moist.  Cardiovascular: S1 S2 auscultated, Regular rate and rhythm.  Respiratory: Clear to auscultation bilaterally with equal chest rise  Abdomen: Soft, obese, nontender, nondistended, + bowel sounds  Extremities: warm dry without cyanosis clubbing or edema  Neuro: awake and alert however only oriented to self and place, no focal deficits  Psych: appropriate  Discharge Instructions      Discharge Instructions    (HEART FAILURE PATIENTS) Call MD:  Anytime you have any of the following symptoms: 1) 3 pound weight gain in 24 hours or 5 pounds in 1 week 2) shortness of breath, with or without a dry hacking cough 3) swelling in the hands, feet or stomach  4) if you have to sleep on extra pillows at night in order to breathe.    Complete by:  As directed      Call MD for:  difficulty breathing, headache or visual  disturbances    Complete by:  As directed      Contraindication to ACEI at discharge    Complete by:  As directed   EF 40-45     Diet - low sodium heart healthy    Complete by:  As directed      Diet Carb Modified    Complete by:  As directed      Discharge instructions    Complete by:  As directed   Patient will be discharged to [facility]. He is continue his medications as prescribed. Patient should follow-up with his primary care physician within one week of discharge. Patient should follow a heart healthy/carb modified diet with 1200 mL fluid restriction per day.     Discharge wound care:    Complete by:  As directed   petrolatum gauze and conservative orders for the open wound (saline continually moist). Additionally, I will provide for gentle compression to the right LE with ACE over Kerlix roll gauze wrap from toe to knee.     Heart Failure patients record your daily weight using the same scale at the same time of day    Complete by:  As directed      Increase activity slowly    Complete by:  As directed             Medication List    TAKE these medications        acetaminophen 500 MG tablet  Commonly known as:  TYLENOL  Take 500 mg by mouth every 6 (six) hours as needed for moderate pain.     albuterol 108 (90 BASE) MCG/ACT inhaler  Commonly known as:  PROVENTIL HFA;VENTOLIN HFA  Inhale 2 puffs into the lungs every 4 (four) hours as needed for wheezing or shortness of breath.     albuterol (2.5 MG/3ML) 0.083% nebulizer solution  Commonly known as:  PROVENTIL  Take 2.5 mg by nebulization 3 (three) times daily.     aspirin EC 81 MG tablet  Take 81 mg by mouth every evening.     atorvastatin 20 MG tablet  Commonly known as:  LIPITOR  Take 1 tablet (20 mg total) by mouth every evening.     carvedilol 6.25 MG tablet  Commonly known as:  COREG  Take 1 tablet (6.25 mg total) by mouth 2 (two) times daily with a meal.     cholecalciferol 1000 UNITS  tablet  Commonly known as:  VITAMIN D  Take 2,000 Units by mouth daily.     feeding supplement (PRO-STAT SUGAR FREE 64) Liqd  Take 30 mLs by mouth 3 (three) times daily with meals.     fenofibrate 160 MG tablet  Take 160 mg by mouth every evening.     gabapentin 100 MG capsule  Commonly known as:  NEURONTIN  Take 200 mg by mouth 2 (two) times daily.     glipiZIDE 5 MG tablet  Commonly known as:  GLUCOTROL  Take 1 tablet (5 mg total) by mouth daily before breakfast.     isosorbide-hydrALAZINE 20-37.5 MG per tablet  Commonly known as:  BIDIL  Take 1 tablet by mouth 3 (three) times daily.     mupirocin ointment 2 %  Commonly known as:  [place] 1  application into the nose 2 (two) times daily.     rivastigmine 4.6 mg/24hr  Commonly known as:  EXELON  Place 4.6 mg onto the skin daily.     torsemide 20 MG tablet  Commonly known as:  DEMADEX  Take 40 mg by mouth 2 (two) times daily.     TRAVATAN Z 0.004 % Soln ophthalmic solution  Generic drug:  Travoprost (BAK Free)  Place 1 drop into the left eye at bedtime.     traZODone 50 MG tablet  Commonly known as:  DESYREL  Take 50 mg by mouth at bedtime.       No Known Allergies Follow-up Information    Follow up with [name] [name]. Schedule an appointment as soon as possible for a visit in 1 week.   Specialty:  Nurse Practitioner   Why:  Hospital followup, repeat CMP   Contact information:   [place] [address] [phone]        The results of significant diagnostics from this hospitalization (including imaging, microbiology, ancillary and laboratory) are listed below for reference.    Significant Diagnostic Studies: Dg Chest Portable 1 View  06/21/2014   CLINICAL DATA:  Shortness of breath.  Bilateral leg swelling.  EXAM: PORTABLE CHEST - 1 VIEW  COMPARISON:  04/05/2014  FINDINGS: Shallow inspiration. Cardiac enlargement with pulmonary vascular congestion and perihilar edema.  Moderate size right pleural effusion with basilar atelectasis. Changes are similar to those seen on previous study. No pneumothorax. Calcification of aorta.  IMPRESSION: Cardiac enlargement with pulmonary vascular congestion and perihilar edema. Right pleural effusion with basilar atelectasis.   Electronically Signed   By: Lucienne Capers M.D.   On: 06/21/2014 03:06    Microbiology: Recent Results (from the past 240 hour(s))  MRSA PCR Screening     Status: None   Collection Time: 06/21/14 10:22 AM  Result Value Ref Range Status   MRSA by PCR NEGATIVE NEGATIVE Final    Comment:        The GeneXpert MRSA Assay (FDA approved for NASAL specimens only), is one component of a comprehensive MRSA colonization surveillance program. It is not intended to diagnose MRSA infection nor to guide or monitor treatment for MRSA infections.      Labs: Basic Metabolic Panel:  Recent Labs Lab 06/21/14 0245 06/22/14 0451 06/23/14 0357 06/24/14 0705  NA 136* 139 140 138  K 3.7 4.2 3.7 3.7  CL 97 100 100 99  CO2 25 23 28 27   GLUCOSE 63* 55* 90 90  BUN 50* 54* 55* 52*  CREATININE 1.85* 1.76* 1.81* 1.80*  CALCIUM 8.8 8.5 8.5 8.6   Liver Function Tests:  Recent Labs Lab 06/21/14 0245 06/22/14 0451 06/23/14 0357 06/24/14 0705  AST 103* 106* 79* 70*  ALT 67* 63* 53 48  ALKPHOS 50 44 42 40  BILITOT 1.5* 1.4* 1.4* 1.7*  PROT 7.3 6.5 6.2 6.4  ALBUMIN 3.3* 3.0* 2.9* 3.1*   No results for input(s): LIPASE, AMYLASE in the last 168 hours. No results for input(s): AMMONIA in the last 168 hours. CBC:  Recent Labs Lab 06/21/14 0245 06/22/14 0451 06/24/14 0705  WBC 4.1 4.6 4.2  NEUTROABS 2.2  --   --   HGB 11.1* 10.9* 11.0*  HCT 32.8* 32.6* 32.6*  MCV 97.6 98.2 97.0  PLT 154 136* 143*   Cardiac Enzymes: No results for input(s): CKTOTAL, CKMB, CKMBINDEX, TROPONINI in the last 168 hours. BNP: BNP (last 3 results)  Recent Labs  04/07/14 0335 04/08/14 0015 06/21/14 0245  PROBNP  6851.0* 6535.0* 7262.0*   CBG:  Recent Labs Lab 06/23/14 1105 06/23/14 1646 06/23/14 2114 06/24/14 0638 06/24/14 0653  GLUCAP 133* 131* 138* 69* 82       Signed:  Simmie Garin  [facility] 06/24/2014, 9:29 AM

## 2014-06-24 NOTE — Plan of Care (Signed)
Problem: Discharge Progression Outcomes Goal: Activity appropriate for discharge plan Outcome: Completed/Met Date Met:  06/24/14     

## 2014-06-24 NOTE — Plan of Care (Signed)
Problem: Discharge Progression Outcomes Goal: Able to perform self care activities Outcome: Completed/Met Date Met:  06/24/14

## 2014-06-24 NOTE — Progress Notes (Signed)
Report called to [place], nurse at [facility].  Verbalized understanding.  Karie Kirks, Therapist, sports.

## 2014-06-24 NOTE — Progress Notes (Signed)
Hypoglycemic Event  CBG: 69  Treatment: 15 GM carbohydrate snack  Symptoms: Pale and None  Follow-up CBG: [id number] CBG Result:82  Possible Reasons for Event: Unknown  Comments/MD notified:n/a    [place]  Remember to initiate Hypoglycemia Order Set & complete

## 2014-06-24 NOTE — Plan of Care (Signed)
Problem: Discharge Progression Outcomes Goal: Complications resolved/controlled Outcome: Completed/Met Date Met:  06/24/14     

## 2014-07-01 ENCOUNTER — Ambulatory Visit: Payer: Medicaid Other

## 2014-07-10 ENCOUNTER — Emergency Department (HOSPITAL_COMMUNITY): Payer: PRIVATE HEALTH INSURANCE

## 2014-07-10 ENCOUNTER — Encounter (HOSPITAL_COMMUNITY): Payer: Self-pay | Admitting: Emergency Medicine

## 2014-07-10 ENCOUNTER — Inpatient Hospital Stay (HOSPITAL_COMMUNITY)
Admission: EM | Admit: 2014-07-10 | Discharge: 2014-07-23 | DRG: 291 | Disposition: A | Discharge status: Skilled Nursing Facility | Payer: PRIVATE HEALTH INSURANCE | Attending: Internal Medicine | Admitting: Internal Medicine

## 2014-07-10 DIAGNOSIS — N183 Chronic kidney disease, stage 3 unspecified: Secondary | ICD-10-CM | POA: Diagnosis present

## 2014-07-10 DIAGNOSIS — I129 Hypertensive chronic kidney disease with stage 1 through stage 4 chronic kidney disease, or unspecified chronic kidney disease: Secondary | ICD-10-CM | POA: Diagnosis present

## 2014-07-10 DIAGNOSIS — Z515 Encounter for palliative care: Secondary | ICD-10-CM

## 2014-07-10 DIAGNOSIS — Z9841 Cataract extraction status, right eye: Secondary | ICD-10-CM

## 2014-07-10 DIAGNOSIS — Z66 Do not resuscitate: Secondary | ICD-10-CM | POA: Diagnosis present

## 2014-07-10 DIAGNOSIS — I429 Cardiomyopathy, unspecified: Secondary | ICD-10-CM | POA: Diagnosis present

## 2014-07-10 DIAGNOSIS — E1143 Type 2 diabetes mellitus with diabetic autonomic (poly)neuropathy: Secondary | ICD-10-CM

## 2014-07-10 DIAGNOSIS — Z7982 Long term (current) use of aspirin: Secondary | ICD-10-CM

## 2014-07-10 DIAGNOSIS — E785 Hyperlipidemia, unspecified: Secondary | ICD-10-CM | POA: Diagnosis present

## 2014-07-10 DIAGNOSIS — R339 Retention of urine, unspecified: Secondary | ICD-10-CM | POA: Diagnosis present

## 2014-07-10 DIAGNOSIS — E876 Hypokalemia: Secondary | ICD-10-CM | POA: Diagnosis present

## 2014-07-10 DIAGNOSIS — Z961 Presence of intraocular lens: Secondary | ICD-10-CM | POA: Diagnosis present

## 2014-07-10 DIAGNOSIS — I252 Old myocardial infarction: Secondary | ICD-10-CM | POA: Diagnosis not present

## 2014-07-10 DIAGNOSIS — R627 Adult failure to thrive: Secondary | ICD-10-CM | POA: Diagnosis not present

## 2014-07-10 DIAGNOSIS — T501X5A Adverse effect of loop [high-ceiling] diuretics, initial encounter: Secondary | ICD-10-CM | POA: Diagnosis present

## 2014-07-10 DIAGNOSIS — I5033 Acute on chronic diastolic (congestive) heart failure: Secondary | ICD-10-CM | POA: Diagnosis not present

## 2014-07-10 DIAGNOSIS — I739 Peripheral vascular disease, unspecified: Secondary | ICD-10-CM | POA: Diagnosis present

## 2014-07-10 DIAGNOSIS — J441 Chronic obstructive pulmonary disease with (acute) exacerbation: Secondary | ICD-10-CM

## 2014-07-10 DIAGNOSIS — F0390 Unspecified dementia without behavioral disturbance: Secondary | ICD-10-CM | POA: Diagnosis present

## 2014-07-10 DIAGNOSIS — R0600 Dyspnea, unspecified: Secondary | ICD-10-CM

## 2014-07-10 DIAGNOSIS — R319 Hematuria, unspecified: Secondary | ICD-10-CM | POA: Diagnosis present

## 2014-07-10 DIAGNOSIS — J9621 Acute and chronic respiratory failure with hypoxia: Secondary | ICD-10-CM

## 2014-07-10 DIAGNOSIS — I2510 Atherosclerotic heart disease of native coronary artery without angina pectoris: Secondary | ICD-10-CM | POA: Diagnosis present

## 2014-07-10 DIAGNOSIS — I5043 Acute on chronic combined systolic (congestive) and diastolic (congestive) heart failure: Secondary | ICD-10-CM | POA: Diagnosis present

## 2014-07-10 DIAGNOSIS — Z9842 Cataract extraction status, left eye: Secondary | ICD-10-CM

## 2014-07-10 DIAGNOSIS — I351 Nonrheumatic aortic (valve) insufficiency: Secondary | ICD-10-CM | POA: Diagnosis present

## 2014-07-10 DIAGNOSIS — J90 Pleural effusion, not elsewhere classified: Secondary | ICD-10-CM | POA: Diagnosis present

## 2014-07-10 DIAGNOSIS — G9340 Encephalopathy, unspecified: Secondary | ICD-10-CM | POA: Diagnosis present

## 2014-07-10 DIAGNOSIS — L97509 Non-pressure chronic ulcer of other part of unspecified foot with unspecified severity: Secondary | ICD-10-CM | POA: Diagnosis present

## 2014-07-10 DIAGNOSIS — R0602 Shortness of breath: Secondary | ICD-10-CM | POA: Diagnosis present

## 2014-07-10 DIAGNOSIS — J9620 Acute and chronic respiratory failure, unspecified whether with hypoxia or hypercapnia: Secondary | ICD-10-CM | POA: Diagnosis present

## 2014-07-10 DIAGNOSIS — R0902 Hypoxemia: Secondary | ICD-10-CM

## 2014-07-10 DIAGNOSIS — J449 Chronic obstructive pulmonary disease, unspecified: Secondary | ICD-10-CM | POA: Diagnosis present

## 2014-07-10 DIAGNOSIS — I10 Essential (primary) hypertension: Secondary | ICD-10-CM | POA: Diagnosis present

## 2014-07-10 DIAGNOSIS — I340 Nonrheumatic mitral (valve) insufficiency: Secondary | ICD-10-CM | POA: Diagnosis present

## 2014-07-10 DIAGNOSIS — Z87891 Personal history of nicotine dependence: Secondary | ICD-10-CM | POA: Diagnosis not present

## 2014-07-10 DIAGNOSIS — I509 Heart failure, unspecified: Secondary | ICD-10-CM | POA: Insufficient documentation

## 2014-07-10 DIAGNOSIS — E119 Type 2 diabetes mellitus without complications: Secondary | ICD-10-CM

## 2014-07-10 LAB — BASIC METABOLIC PANEL
Anion gap: 13 (ref 5–15)
BUN: 51 mg/dL — ABNORMAL HIGH (ref 6–23)
CO2: 28 mEq/L (ref 19–32)
Calcium: 8.7 mg/dL (ref 8.4–10.5)
Chloride: 100 mEq/L (ref 96–112)
Creatinine, Ser: 1.72 mg/dL — ABNORMAL HIGH (ref 0.50–1.35)
GFR calc Af Amer: 41 mL/min — ABNORMAL LOW (ref >90)
GFR calc non Af Amer: 36 mL/min — ABNORMAL LOW (ref >90)
GLUCOSE: 175 mg/dL — AB (ref 70–99)
Potassium: 4.1 mEq/L (ref 3.7–5.3)
Sodium: 141 mEq/L (ref 137–147)

## 2014-07-10 LAB — PRO B NATRIURETIC PEPTIDE: Pro B Natriuretic peptide (BNP): 8042 pg/mL — ABNORMAL HIGH (ref 0–450)

## 2014-07-10 LAB — CBC WITH DIFFERENTIAL/PLATELET
Basophils Absolute: 0 10*3/uL (ref 0.0–0.1)
Basophils Relative: 0 % (ref 0–1)
Eosinophils Absolute: 0.2 10*3/uL (ref 0.0–0.7)
Eosinophils Relative: 3 % (ref 0–5)
HCT: 36.5 % — ABNORMAL LOW (ref 39.0–52.0)
Hemoglobin: 12 g/dL — ABNORMAL LOW (ref 13.0–17.0)
LYMPHS ABS: 1 10*3/uL (ref 0.7–4.0)
LYMPHS PCT: 22 % (ref 12–46)
MCH: 32.5 pg (ref 26.0–34.0)
MCHC: 32.9 g/dL (ref 30.0–36.0)
MCV: 98.9 fL (ref 78.0–100.0)
Monocytes Absolute: 0.7 10*3/uL (ref 0.1–1.0)
Monocytes Relative: 14 % — ABNORMAL HIGH (ref 3–12)
NEUTROS PCT: 61 % (ref 43–77)
Neutro Abs: 2.8 10*3/uL (ref 1.7–7.7)
Platelets: 168 10*3/uL (ref 150–400)
RBC: 3.69 MIL/uL — AB (ref 4.22–5.81)
RDW: 15.7 % — ABNORMAL HIGH (ref 11.5–15.5)
WBC: 4.7 10*3/uL (ref 4.0–10.5)

## 2014-07-10 LAB — I-STAT ARTERIAL BLOOD GAS, ED
ACID-BASE EXCESS: 9 mmol/L — AB (ref 0.0–2.0)
Bicarbonate: 36.5 mEq/L — ABNORMAL HIGH (ref 20.0–24.0)
O2 Saturation: 100 %
PCO2 ART: 60.6 mmHg — AB (ref 35.0–45.0)
Patient temperature: 97.7
TCO2: 38 mmol/L (ref 0–100)
pH, Arterial: 7.386 (ref 7.350–7.450)
pO2, Arterial: 326 mmHg — ABNORMAL HIGH (ref 80.0–100.0)

## 2014-07-10 LAB — I-STAT TROPONIN, ED: Troponin i, poc: 0.01 ng/mL (ref 0.00–0.08)

## 2014-07-10 LAB — CBG MONITORING, ED: Glucose-Capillary: 189 mg/dL — ABNORMAL HIGH (ref 70–99)

## 2014-07-10 MED ORDER — FUROSEMIDE 10 MG/ML IJ SOLN
40.0000 mg | Freq: Once | INTRAMUSCULAR | Status: AC
  Administered 2014-07-10: 40 mg via INTRAVENOUS
  Filled 2014-07-10: qty 4

## 2014-07-10 MED ORDER — ALBUTEROL SULFATE HFA 108 (90 BASE) MCG/ACT IN AERS: 2.0000 | INHALATION_SPRAY | RESPIRATORY_TRACT | Status: DC | PRN

## 2014-07-10 MED ORDER — ALBUTEROL SULFATE (2.5 MG/3ML) 0.083% IN NEBU: 2.5000 mg | INHALATION_SOLUTION | RESPIRATORY_TRACT | Status: DC | PRN

## 2014-07-10 MED ORDER — CARVEDILOL 6.25 MG PO TABS
6.2500 mg | ORAL_TABLET | Freq: Two times a day (BID) | ORAL | Status: DC
  Administered 2014-07-11 – 2014-07-12 (×3): 6.25 mg via ORAL
  Administered 2014-07-12: 11:00:00 via ORAL
  Administered 2014-07-13 – 2014-07-19 (×13): 6.25 mg via ORAL
  Filled 2014-07-10 (×21): qty 1

## 2014-07-10 MED ORDER — METHYLPREDNISOLONE SODIUM SUCC 125 MG IJ SOLR
60.0000 mg | Freq: Two times a day (BID) | INTRAMUSCULAR | Status: DC
  Filled 2014-07-10 (×3): qty 0.96

## 2014-07-10 MED ORDER — GABAPENTIN 100 MG PO CAPS
200.0000 mg | ORAL_CAPSULE | Freq: Two times a day (BID) | ORAL | Status: DC
  Administered 2014-07-10 – 2014-07-18 (×16): 200 mg via ORAL
  Filled 2014-07-10 (×18): qty 2

## 2014-07-10 MED ORDER — METHYLPREDNISOLONE SODIUM SUCC 125 MG IJ SOLR
60.0000 mg | Freq: Two times a day (BID) | INTRAMUSCULAR | Status: DC
  Filled 2014-07-10: qty 0.96

## 2014-07-10 MED ORDER — INSULIN ASPART 100 UNIT/ML ~~LOC~~ SOLN
0.0000 [IU] | Freq: Three times a day (TID) | SUBCUTANEOUS | Status: DC
  Administered 2014-07-11 (×2): 3 [IU] via SUBCUTANEOUS
  Administered 2014-07-12: 5 [IU] via SUBCUTANEOUS
  Administered 2014-07-12: 3 [IU] via SUBCUTANEOUS
  Administered 2014-07-13: 8 [IU] via SUBCUTANEOUS
  Administered 2014-07-13: 5 [IU] via SUBCUTANEOUS
  Administered 2014-07-13 – 2014-07-14 (×2): 3 [IU] via SUBCUTANEOUS
  Administered 2014-07-14 (×2): 8 [IU] via SUBCUTANEOUS
  Administered 2014-07-15: 3 [IU] via SUBCUTANEOUS
  Administered 2014-07-15: 2 [IU] via SUBCUTANEOUS
  Administered 2014-07-15: 3 [IU] via SUBCUTANEOUS
  Administered 2014-07-16 (×2): 5 [IU] via SUBCUTANEOUS
  Administered 2014-07-16: 6 [IU] via SUBCUTANEOUS
  Administered 2014-07-17 – 2014-07-18 (×4): 3 [IU] via SUBCUTANEOUS
  Administered 2014-07-18: 5 [IU] via SUBCUTANEOUS
  Administered 2014-07-19: 3 [IU] via SUBCUTANEOUS
  Administered 2014-07-20: 2 [IU] via SUBCUTANEOUS
  Administered 2014-07-20: 5 [IU] via SUBCUTANEOUS
  Administered 2014-07-20: 3 [IU] via SUBCUTANEOUS
  Administered 2014-07-21: 5 [IU] via SUBCUTANEOUS

## 2014-07-10 MED ORDER — ASPIRIN EC 81 MG PO TBEC
81.0000 mg | DELAYED_RELEASE_TABLET | Freq: Every evening | ORAL | Status: DC
  Administered 2014-07-10 – 2014-07-21 (×12): 81 mg via ORAL
  Filled 2014-07-10 (×13): qty 1

## 2014-07-10 MED ORDER — HEPARIN SODIUM (PORCINE) 5000 UNIT/ML IJ SOLN
5000.0000 [IU] | Freq: Three times a day (TID) | INTRAMUSCULAR | Status: DC
  Administered 2014-07-10 – 2014-07-16 (×16): 5000 [IU] via SUBCUTANEOUS
  Filled 2014-07-10 (×19): qty 1

## 2014-07-10 MED ORDER — PRO-STAT SUGAR FREE PO LIQD
30.0000 mL | Freq: Three times a day (TID) | ORAL | Status: DC
  Administered 2014-07-11 – 2014-07-20 (×25): 30 mL via ORAL
  Administered 2014-07-20: 16:00:00 via ORAL
  Administered 2014-07-21 (×2): 30 mL via ORAL
  Filled 2014-07-10 (×42): qty 30

## 2014-07-10 MED ORDER — TRAZODONE HCL 50 MG PO TABS
50.0000 mg | ORAL_TABLET | Freq: Every day | ORAL | Status: DC
  Administered 2014-07-12 – 2014-07-17 (×7): 50 mg via ORAL
  Filled 2014-07-10 (×9): qty 1

## 2014-07-10 MED ORDER — ATORVASTATIN CALCIUM 20 MG PO TABS
20.0000 mg | ORAL_TABLET | Freq: Every evening | ORAL | Status: DC
  Administered 2014-07-10 – 2014-07-21 (×12): 20 mg via ORAL
  Filled 2014-07-10 (×13): qty 1

## 2014-07-10 MED ORDER — ASPIRIN 81 MG PO CHEW
324.0000 mg | CHEWABLE_TABLET | Freq: Once | ORAL | Status: DC
  Filled 2014-07-10: qty 4

## 2014-07-10 MED ORDER — METHYLPREDNISOLONE SODIUM SUCC 125 MG IJ SOLR
125.0000 mg | Freq: Once | INTRAMUSCULAR | Status: AC
  Administered 2014-07-10: 125 mg via INTRAVENOUS
  Filled 2014-07-10: qty 2

## 2014-07-10 MED ORDER — RIVASTIGMINE 4.6 MG/24HR TD PT24
4.6000 mg | MEDICATED_PATCH | Freq: Every day | TRANSDERMAL | Status: DC
  Administered 2014-07-11 – 2014-07-23 (×13): 4.6 mg via TRANSDERMAL
  Filled 2014-07-10 (×13): qty 1

## 2014-07-10 MED ORDER — ACETAMINOPHEN 500 MG PO TABS
500.0000 mg | ORAL_TABLET | Freq: Four times a day (QID) | ORAL | Status: DC | PRN
  Administered 2014-07-11: 500 mg via ORAL
  Filled 2014-07-10: qty 1

## 2014-07-10 MED ORDER — FUROSEMIDE 10 MG/ML IJ SOLN
80.0000 mg | Freq: Two times a day (BID) | INTRAMUSCULAR | Status: DC
  Administered 2014-07-11 – 2014-07-12 (×3): 80 mg via INTRAVENOUS
  Filled 2014-07-10 (×7): qty 8

## 2014-07-10 MED ORDER — IPRATROPIUM-ALBUTEROL 0.5-2.5 (3) MG/3ML IN SOLN
3.0000 mL | RESPIRATORY_TRACT | Status: DC
  Administered 2014-07-10: 3 mL via RESPIRATORY_TRACT
  Filled 2014-07-10 (×2): qty 3

## 2014-07-10 MED ORDER — MUPIROCIN 2 % EX OINT
1.0000 "application " | TOPICAL_OINTMENT | Freq: Two times a day (BID) | CUTANEOUS | Status: DC
  Administered 2014-07-10 – 2014-07-23 (×18): 1 via NASAL
  Filled 2014-07-10: qty 22

## 2014-07-10 MED ORDER — ALBUTEROL SULFATE (2.5 MG/3ML) 0.083% IN NEBU
2.5000 mg | INHALATION_SOLUTION | Freq: Three times a day (TID) | RESPIRATORY_TRACT | Status: DC
  Administered 2014-07-11 – 2014-07-22 (×31): 2.5 mg via RESPIRATORY_TRACT
  Filled 2014-07-10 (×34): qty 3

## 2014-07-10 MED ORDER — LATANOPROST 0.005 % OP SOLN
1.0000 [drp] | Freq: Every day | OPHTHALMIC | Status: DC
  Administered 2014-07-10 – 2014-07-22 (×13): 1 [drp] via OPHTHALMIC
  Filled 2014-07-10: qty 2.5

## 2014-07-10 MED ORDER — FENOFIBRATE 160 MG PO TABS
160.0000 mg | ORAL_TABLET | Freq: Every evening | ORAL | Status: DC
  Administered 2014-07-10 – 2014-07-23 (×14): 160 mg via ORAL
  Filled 2014-07-10 (×14): qty 1

## 2014-07-10 MED ORDER — IPRATROPIUM-ALBUTEROL 0.5-2.5 (3) MG/3ML IN SOLN
3.0000 mL | Freq: Once | RESPIRATORY_TRACT | Status: AC
  Administered 2014-07-10: 3 mL via RESPIRATORY_TRACT
  Filled 2014-07-10: qty 3

## 2014-07-10 MED ORDER — ISOSORB DINITRATE-HYDRALAZINE 20-37.5 MG PO TABS
1.0000 | ORAL_TABLET | Freq: Three times a day (TID) | ORAL | Status: DC
  Administered 2014-07-10 – 2014-07-23 (×39): 1 via ORAL
  Filled 2014-07-10 (×40): qty 1

## 2014-07-10 NOTE — H&P (Signed)
[facility] History and Physical  [name] [name] RCV:[id number] DOB: [birth year] DOA: 07/10/2014  Referring physician: EDP PCP: [name] [name]   Chief Complaint: SOB   HPI: [name] [name] is a 80 y.o. male who presents to the ED with c/o SOB.  NH was concerned for possible CHF.  NH wanted to send patient to ED earlier today for SOB, but family took him out of facility today for thanksgiving lunch, after returning to NH patient was even more SOB.  He was finally sent to ED via EMS.  Patient denies pain but is otherwise unable to appropriately answer questions.  His SOB and O2 sat improved int he ED with breathing treatment.  Review of Systems: Systems reviewed.  As above, otherwise negative  Past Medical History  Diagnosis Date  . Hypertension   . Diabetes mellitus     a. Diet-controlled.  . Arthritis   . Chronic diastolic CHF (congestive heart failure)     a. Primarily diastolic CHF & RV dysfunction but systolic dysfunction. 2D Echo 03/07/14 showed EF 40-45%, diffuse HK, grade 1 diastolic dysfunction, mild MR/AI, mod dilated LA/RA, mildly dilated RV, PA pressure 51mHg. Lexiscan nuc 02/2014: EF 43% with no ischemia, inferior hypokinesis with "inferior attenuation." Dr. Aundra Dubin suspected probable prior inferior MI.  Marland Kitchen COPD (chronic obstructive pulmonary disease)   . Hyperlipidemia   . CKD (chronic kidney disease), stage III     renal insuficiency  . Inferior MI     a. Based on nuc results 02/2014, Dr. Aundra Dubin suspected probable prior inferior MI (EF 43% with no ischemia, inferior hypokinesis with "inferior attenuation").  . Dementia   . PAD (peripheral artery disease)     a. ABI 02/2014: R 0.45, L 0.56. R foot nonhealing ulcers.  . Mild mitral regurgitation   . Mild AI (aortic insufficiency)   . Lung disease     a. Possible interstitial lung disease: Followed by [name]. PFTs (1/15) with FVC 53%, FEV1 46%, ratio 85%, TLC 62%, DLCO 45% (restrictive defect).  . Human  metapneumovirus pneumonia     a. Metapneumovirus PNA in 5/15 with acute respiratory failure and intubation. Bilateral transudative pleural effusions.   Past Surgical History  Procedure Laterality Date  . Leg surgery      both legs - unsure of exact operations - from trauma  . Tonsillectomy    . Appendectomy    . Cataract extraction w/phaco Left 09/04/2013    Procedure: CATARACT EXTRACTION PHACO AND INTRAOCULAR LENS PLACEMENT (IOC);  Surgeon: Adonis Brook, MD;  Location: [place];  Service: Ophthalmology;  Laterality: Left;   Social History:  reports that he quit smoking about 5 years ago. His smoking use included Cigarettes. He has a 35 pack-year smoking history. He has never used smokeless tobacco. He reports that he does not drink alcohol or use illicit drugs.  No Known Allergies  Family History  Problem Relation Age of Onset  . Diabetes Father   . Diabetes Mother      Prior to Admission medications   Medication Sig Start Date End Date Taking? Authorizing Provider  acetaminophen (TYLENOL) 500 MG tablet Take 500 mg by mouth every 6 (six) hours as needed for moderate pain.     Historical Provider, MD  albuterol (PROVENTIL HFA;VENTOLIN HFA) 108 (90 BASE) MCG/ACT inhaler Inhale 2 puffs into the lungs every 4 (four) hours as needed for wheezing or shortness of breath.     Historical Provider, MD  albuterol (PROVENTIL) (2.5 MG/3ML) 0.083% nebulizer solution Take  2.5 mg by nebulization 3 (three) times daily.     Historical Provider, MD  Amino Acids-Protein Hydrolys (FEEDING SUPPLEMENT, PRO-STAT SUGAR FREE 64,) LIQD Take 30 mLs by mouth 3 (three) times daily with meals.    Historical Provider, MD  aspirin EC 81 MG tablet Take 81 mg by mouth every evening.     Historical Provider, MD  atorvastatin (LIPITOR) 20 MG tablet Take 1 tablet (20 mg total) by mouth every evening. 06/24/14   Maryann Mikhail, DO  carvedilol (COREG) 6.25 MG tablet Take 1 tablet (6.25 mg total) by mouth 2 (two) times daily  with a meal. 03/12/14   Dayna N Dunn, PA-C  cholecalciferol (VITAMIN D) 1000 UNITS tablet Take 2,000 Units by mouth daily.     Historical Provider, MD  fenofibrate 160 MG tablet Take 160 mg by mouth every evening.     Historical Provider, MD  gabapentin (NEURONTIN) 100 MG capsule Take 200 mg by mouth 2 (two) times daily.    Historical Provider, MD  glipiZIDE (GLUCOTROL) 5 MG tablet Take 1 tablet (5 mg total) by mouth daily before breakfast. 04/08/14   Theodis Blaze, MD  isosorbide-hydrALAZINE (BIDIL) 20-37.5 MG per tablet Take 1 tablet by mouth 3 (three) times daily. 03/12/14   Dayna N Dunn, PA-C  mupirocin ointment (BACTROBAN) 2 % Place 1 application into the nose 2 (two) times daily.    Historical Provider, MD  rivastigmine (EXELON) 4.6 mg/24hr Place 4.6 mg onto the skin daily.    Historical Provider, MD  torsemide (DEMADEX) 20 MG tablet Take 40 mg by mouth 2 (two) times daily.     Historical Provider, MD  TRAVATAN Z 0.004 % SOLN ophthalmic solution Place 1 drop into the left eye at bedtime.  07/04/13   Historical Provider, MD  traZODone (DESYREL) 50 MG tablet Take 50 mg by mouth at bedtime.    Historical Provider, MD   Physical Exam: Filed Vitals:   07/10/14 2200  BP: 152/98  Pulse: 64  Temp:   Resp:     BP 152/98 mmHg  Pulse 64  Temp(Src) 97.7 F (36.5 C) (Oral)  Resp 16  SpO2 99%  General Appearance:    Alert, oriented to person only, sleepy but will wake up to voice, no distress, appears stated age  Head:    Normocephalic, atraumatic  Eyes:    PERRL, EOMI, sclera non-icteric        Nose:   Nares without drainage or epistaxis. Mucosa, turbinates normal  Throat:   Moist mucous membranes. Oropharynx without erythema or exudate.  Neck:   Supple. No carotid bruits.  No thyromegaly.  No lymphadenopathy.   Back:     No CVA tenderness, no spinal tenderness  Lungs:     Diffuse expiratory wheezing with bibasilar crackles.  Chest wall:    No tenderness to palpitation  Heart:    Regular  rate and rhythm without murmurs, gallops, rubs  Abdomen:     Soft, non-tender, nondistended, normal bowel sounds, no organomegaly  Genitalia:    deferred  Rectal:    deferred  Extremities:   No clubbing, cyanosis or edema.  Pulses:   2+ and symmetric all extremities  Skin:   Skin color, texture, turgor normal, no rashes or lesions  Lymph nodes:   Cervical, supraclavicular, and axillary nodes normal  Neurologic:   CNII-XII intact. Normal strength, sensation and reflexes      throughout    Labs on Admission:  Basic Metabolic Panel:  Recent Labs  Lab 07/10/14 2005  NA 141  K 4.1  CL 100  CO2 28  GLUCOSE 175*  BUN 51*  CREATININE 1.72*  CALCIUM 8.7   Liver Function Tests: No results for input(s): AST, ALT, ALKPHOS, BILITOT, PROT, ALBUMIN in the last 168 hours. No results for input(s): LIPASE, AMYLASE in the last 168 hours. No results for input(s): AMMONIA in the last 168 hours. CBC:  Recent Labs Lab 07/10/14 2005  WBC 4.7  NEUTROABS 2.8  HGB 12.0*  HCT 36.5*  MCV 98.9  PLT 168   Cardiac Enzymes: No results for input(s): CKTOTAL, CKMB, CKMBINDEX, TROPONINI in the last 168 hours.  BNP (last 3 results)  Recent Labs  04/08/14 0015 06/21/14 0245 07/10/14 2005  PROBNP 6535.0* 7262.0* 8042.0*   CBG:  Recent Labs Lab 07/10/14 2026  GLUCAP 189*    Radiological Exams on Admission: Dg Chest Port 1 View  07/10/2014   CLINICAL DATA:  Dyspnea  EXAM: PORTABLE CHEST - 1 VIEW  COMPARISON:  June 21, 2014  FINDINGS: There is a sizable pleural effusion on the right with consolidation involving the right middle and lower lobes. Left lung is clear. Heart is enlarged with pulmonary vascularity within normal limits. No adenopathy appreciable.  IMPRESSION: Persistent right effusion with consolidation involving essentially all of the right middle and lower lobes. Left lung clear. No change in cardiac silhouette.   Electronically Signed   By: Lowella Grip M.D.   On:  07/10/2014 21:25    EKG: Independently reviewed.  Assessment/Plan Principal Problem:   Acute on chronic respiratory failure Active Problems:   CKD (chronic kidney disease), stage III   COPD (chronic obstructive pulmonary disease)   Diabetes mellitus   CHF (congestive heart failure), NYHA class II   Acute on chronic combined systolic and diastolic heart failure   Essential hypertension   CHF exacerbation   1. Acute on chronic respiratory failure due to CHF exacerbation and COPD exacerbation both. 2. Acute on chronic combined systolic and diastolic CHF - 1. EF is 40% at baseline 2. Holding home torsemide and using IV lasix 3. On CHF pathway 4. Strict intake and output to monitor his CKD stage 3 5. Tele monitor 3. COPD exacerbation - 1. IV steroids 2. Breathing treatments 3. Continuous pulse ox 4. CKD stage 3 - creatinine at baseline currently, monitor with diuresis   Code Status: DNR per the MOST form on file with him Family Communication: No family in room Disposition Plan: Admit to inpatient   Time spent: 70 min  GARDNER, JARED M. [facility] Pager [phone]  If 7AM-7PM, please contact the day team taking care of the patient [email or web address] Password [facility] 07/10/2014, 10:29 PM

## 2014-07-10 NOTE — ED Provider Notes (Signed)
TIME SEEN: 8:07 PM  CHIEF COMPLAINT: Shortness of breath  HPI: Pt is a 80 y.o. M history of hypertension, diabetes, CHF, COPD, hyperlipidemia, chronic kidney disease, dementia who presents from [place] with increased shortness of breath. Nursing home was concerned for heart failure. Per EMS, nursing facility wanted to send patient to the emergency department earlier today family took him out of the nursing facility for Thanksgiving lunch. When they returned to the nursing facility staff that he was more short of breath normal. Per EMS, oxygen saturation was in the uppers 80s on room air. He does not wear oxygen chronically. Patient denies any pain but is otherwise unable to answer questions appropriately.  PCP Garwin Brothers  ROS: Level V caveat for dementia  PAST MEDICAL HISTORY/PAST SURGICAL HISTORY:  Past Medical History  Diagnosis Date  . Hypertension   . Diabetes mellitus     a. Diet-controlled.  . Arthritis   . Chronic diastolic CHF (congestive heart failure)     a. Primarily diastolic CHF & RV dysfunction but systolic dysfunction. 2D Echo 03/07/14 showed EF 40-45%, diffuse HK, grade 1 diastolic dysfunction, mild MR/AI, mod dilated LA/RA, mildly dilated RV, PA pressure 37mHg. Lexiscan nuc 02/2014: EF 43% with no ischemia, inferior hypokinesis with "inferior attenuation." Dr. Aundra Dubin suspected probable prior inferior MI.  Marland Kitchen COPD (chronic obstructive pulmonary disease)   . Hyperlipidemia   . CKD (chronic kidney disease), stage III     renal insuficiency  . Inferior MI     a. Based on nuc results 02/2014, Dr. Aundra Dubin suspected probable prior inferior MI (EF 43% with no ischemia, inferior hypokinesis with "inferior attenuation").  . Dementia   . PAD (peripheral artery disease)     a. ABI 02/2014: R 0.45, L 0.56. R foot nonhealing ulcers.  . Mild mitral regurgitation   . Mild AI (aortic insufficiency)   . Lung disease     a. Possible interstitial lung disease: Followed by Dr.  Chase Caller. PFTs (1/15) with FVC 53%, FEV1 46%, ratio 85%, TLC 62%, DLCO 45% (restrictive defect).  . Human metapneumovirus pneumonia     a. Metapneumovirus PNA in 5/15 with acute respiratory failure and intubation. Bilateral transudative pleural effusions.    MEDICATIONS:  Prior to Admission medications   Medication Sig Start Date End Date Taking? Authorizing Provider  acetaminophen (TYLENOL) 500 MG tablet Take 500 mg by mouth every 6 (six) hours as needed for moderate pain.     Historical Provider, MD  albuterol (PROVENTIL HFA;VENTOLIN HFA) 108 (90 BASE) MCG/ACT inhaler Inhale 2 puffs into the lungs every 4 (four) hours as needed for wheezing or shortness of breath.     Historical Provider, MD  albuterol (PROVENTIL) (2.5 MG/3ML) 0.083% nebulizer solution Take 2.5 mg by nebulization 3 (three) times daily.     Historical Provider, MD  Amino Acids-Protein Hydrolys (FEEDING SUPPLEMENT, PRO-STAT SUGAR FREE 64,) LIQD Take 30 mLs by mouth 3 (three) times daily with meals.    Historical Provider, MD  aspirin EC 81 MG tablet Take 81 mg by mouth every evening.     Historical Provider, MD  atorvastatin (LIPITOR) 20 MG tablet Take 1 tablet (20 mg total) by mouth every evening. 06/24/14   Maryann Mikhail, DO  carvedilol (COREG) 6.25 MG tablet Take 1 tablet (6.25 mg total) by mouth 2 (two) times daily with a meal. 03/12/14   Dayna N Dunn, PA-C  cholecalciferol (VITAMIN D) 1000 UNITS tablet Take 2,000 Units by mouth daily.     Historical Provider, MD  fenofibrate 160 MG tablet Take 160 mg by mouth every evening.     Historical Provider, MD  gabapentin (NEURONTIN) 100 MG capsule Take 200 mg by mouth 2 (two) times daily.    Historical Provider, MD  glipiZIDE (GLUCOTROL) 5 MG tablet Take 1 tablet (5 mg total) by mouth daily before breakfast. 04/08/14   Theodis Blaze, MD  isosorbide-hydrALAZINE (BIDIL) 20-37.5 MG per tablet Take 1 tablet by mouth 3 (three) times daily. 03/12/14   Dayna N Dunn, PA-C  mupirocin  ointment (BACTROBAN) 2 % Place 1 application into the nose 2 (two) times daily.    Historical Provider, MD  rivastigmine (EXELON) 4.6 mg/24hr Place 4.6 mg onto the skin daily.    Historical Provider, MD  torsemide (DEMADEX) 20 MG tablet Take 40 mg by mouth 2 (two) times daily.     Historical Provider, MD  TRAVATAN Z 0.004 % SOLN ophthalmic solution Place 1 drop into the left eye at bedtime.  07/04/13   Historical Provider, MD  traZODone (DESYREL) 50 MG tablet Take 50 mg by mouth at bedtime.    Historical Provider, MD    ALLERGIES:  No Known Allergies  SOCIAL HISTORY:  History  Substance Use Topics  . Smoking status: Former Smoker -- 0.50 packs/day for 70 years    Types: Cigarettes    Quit date: 05/14/2009  . Smokeless tobacco: Never Used  . Alcohol Use: No    FAMILY HISTORY: Family History  Problem Relation Age of Onset  . Diabetes Father   . Diabetes Mother     EXAM: BP 150/92 mmHg  Pulse 65  Temp(Src) 97.7 F (36.5 C) (Oral)  Resp 14  SpO2 100% CONSTITUTIONAL: Alert and oriented to person only, no apparent distress, nontoxic, elderly HEAD: Normocephalic EYES: Conjunctivae clear, PERRL ENT: normal nose; no rhinorrhea; moist mucous membranes; pharynx without lesions noted NECK: Supple, no meningismus, no LAD  CARD: RRR; S1 and S2 appreciated; no murmurs, no clicks, no rubs, no gallops RESP: Normal chest excursion without splinting; patient is mildly tachypneic, diffuse expiratory wheezing, bibasilar crackles, diminished at his bases ABD/GI: Normal bowel sounds; non-distended; soft, non-tender, no rebound, no guarding; no fluid wave or tympany BACK:  The back appears normal and is non-tender to palpation, there is no CVA tenderness EXT: Normal ROM in all joints; non-tender to palpation; pitting edema throughout the lower extremities and abdomen; normal capillary refill; no cyanosis    SKIN: Normal color for age and race; warm NEURO: Moves all extremities equally; no  slurred speech or facial droop PSYCH: The patient's mood and manner are appropriate. Grooming and personal hygiene are appropriate.  MEDICAL DECISION MAKING: Pt here with SOB - concern for COPD versus CHF exacerbation. He does appear volume overloaded. He is on torsemide 20 mg twice daily. We'll give Lasix 40 mg IV, aspirin, DuoNeb and Solu-Medrol. He is mildly tachypneic with diminished aeration at his bases, bibasilar crackles and diffuse expiratory wheezes. We'll obtain cardiac labs, chest x-ray and ABG.  EKG shows no new ischemic changes.  ED PROGRESS: Patient's ABG appears that he has compensating. Troponin negative. His BNP is elevated at greater than 8000 which is higher than his most recent admission for CHF exacerbation.  Chest x-ray shows persistent right-sided effusion with consolidation. He has no leukocytosis or cough. No fever. Concern for COPD exacerbation, CHF exacerbation. We'll discuss with hospitalist for admission. His tachypnea has improved after Lasix and DuoNeb.   9:53 PM  Spoke with Dr. Alcario Drought for admission to telemetry,  inpatient admission.     EKG Interpretation  Date/Time:  Thursday July 10 2014 20:07:55 EST Ventricular Rate:  67 PR Interval:  163 QRS Duration: 83 QT Interval:  442 QTC Calculation: 467 R Axis:   17 Text Interpretation:  Sinus rhythm Nonspecific T abnormalities, lateral leads No significant change since last tracing Confirmed by Prince Olivier,  DO, Codylee Patil [phone]) on 07/10/2014 8:22:09 PM         Perry, DO 07/10/14 2153

## 2014-07-10 NOTE — Progress Notes (Signed)
Patient arrived to [facility] by stretcher from the emergency department.  Patient has expiratory wheezes and coarse crackles throughout the lungs.  Patient very fatigued and asleep since arrival.  Will attempt to administer medications.  Patient does not look to be in any pain or distress other than labored breathing.

## 2014-07-10 NOTE — ED Notes (Signed)
Respiratory at bedside.

## 2014-07-10 NOTE — ED Notes (Addendum)
Per EMS, pt comes from [facility] with facility c/o SOB. Pt in acute distress. Pt on 15L non-rebreather. Perfused crackles in lungs, BUE & BLE +3 edematous. 5mg  Albuterol txt given en route. BP 163/77, P64, 98% O2 on non-rebreather, CBG 198. Pt alert to self. Pt has h/o CHF.

## 2014-07-11 DIAGNOSIS — J441 Chronic obstructive pulmonary disease with (acute) exacerbation: Secondary | ICD-10-CM

## 2014-07-11 LAB — BASIC METABOLIC PANEL
ANION GAP: 13 (ref 5–15)
BUN: 53 mg/dL — AB (ref 6–23)
CO2: 29 mEq/L (ref 19–32)
Calcium: 8.7 mg/dL (ref 8.4–10.5)
Chloride: 102 mEq/L (ref 96–112)
Creatinine, Ser: 1.81 mg/dL — ABNORMAL HIGH (ref 0.50–1.35)
GFR calc Af Amer: 39 mL/min — ABNORMAL LOW (ref >90)
GFR, EST NON AFRICAN AMERICAN: 33 mL/min — AB (ref >90)
Glucose, Bld: 208 mg/dL — ABNORMAL HIGH (ref 70–99)
Potassium: 4 mEq/L (ref 3.7–5.3)
Sodium: 144 mEq/L (ref 137–147)

## 2014-07-11 LAB — GLUCOSE, CAPILLARY
GLUCOSE-CAPILLARY: 180 mg/dL — AB (ref 70–99)
GLUCOSE-CAPILLARY: 115 mg/dL — AB (ref 70–99)
Glucose-Capillary: 167 mg/dL — ABNORMAL HIGH (ref 70–99)
Glucose-Capillary: 159 mg/dL — ABNORMAL HIGH (ref 70–99)

## 2014-07-11 MED ORDER — METHYLPREDNISOLONE SODIUM SUCC 125 MG IJ SOLR
60.0000 mg | Freq: Three times a day (TID) | INTRAMUSCULAR | Status: DC
  Administered 2014-07-11 – 2014-07-14 (×10): 60 mg via INTRAVENOUS
  Filled 2014-07-11 (×13): qty 0.96

## 2014-07-11 NOTE — Consult Note (Addendum)
WOC wound consult note Reason for Consult: Consult requested for right leg wounds Wound type: Several patchy areas of partial and full thickness stasis ulcers. Pt has multiple scars on bilat legs from previous wounds which have healed. Measurement: Right leg with 3 partial thickness wounds to upper leg, .2X.2X.1cm, .1X.1X.1cm, .3X.3X.1cm; all are pink and moist with small amt yellow drainage. Right lower leg with 3 full thickness wounds; .3X.2X.2cm, 1.2X1X.2cm, 2X.3X.2cm.  All are moist and red with mod amt yellow drainage, white macerated wound edges. Dressing procedure/placement/frequency: Foam dressing to absorb drainage and promote healing. Please re-consult if further assistance is needed.  Thank-you,  Julien Girt MSN, [place], [place], [place], [place]

## 2014-07-11 NOTE — Care Management Note (Addendum)
  Page 1 of 1   07/15/2014     5:00:37 PM CARE MANAGEMENT NOTE 07/15/2014  Patient:  BENYAMIN, JEFF   Account Number:  [id number]  Date Initiated:  07/11/2014  Documentation initiated by:  AMERSON,JULIE  Subjective/Objective Assessment:   Pt adm on 07/10/14 with respiratory failure.  PTA, pt resided at [facility].     Action/Plan:   Will consult CSW to facilitate return to SNF when medically stable for dc.   Anticipated DC Date:  07/14/2014   Anticipated DC Plan:  SKILLED NURSING FACILITY  In-house referral  Clinical Social Worker      DC Planning Services  CM consult      Choice offered to / List presented to:             Status of service:  Completed, signed off Medicare Important Message given?  YES (If response is "NO", the following Medicare IM given date fields will be blank) Date Medicare IM given:  07/14/2014 Medicare IM given by:   Date Additional Medicare IM given:   Additional Medicare IM given by:    Discharge Disposition:  [place]  Per UR Regulation:  Reviewed for med. necessity/level of care/duration of stay  If discussed at [place] of Stay Meetings, dates discussed:   07/15/2014    Comments:  Mariann Laster RN, BSN, MSHL, CCM  Nurse - Case Manager,  ([facility])  [phone]  07/15/2014 Social:  from SNF/[facility]. PT RECS:  SNF Dispo Plan:  Return to SNF

## 2014-07-11 NOTE — Progress Notes (Signed)
Report given to Chtistopher,RNBSN

## 2014-07-11 NOTE — Progress Notes (Signed)
PROGRESS NOTE  [name] [name] ZCH:[id number] DOB: 1930-10-18 DOA: 07/10/2014 PCP: [name], NP  Brief history 80 year old male with a history of systolic and diastolic CHF, COPD, CKD stage III, diabetes mellitus type 2, dementia presents from [place] care with shortness of breath 1 day. Apparently, the nursing facility wanted to send the patient to the emergency department on the morning of 07/10/2014 because of shortness of breath. The patient's family took the patient home for Thanksgiving lunch. After he returned, the patient was even more short of breath. He presented to the emergency department where he was noted to have oxygen saturation of 90% on room air. Chest x-ray showed right pleural effusion. ProBNP was 8042. The patient was started on intravenous furosemide. The patient was recently discharged from the hospital on 06/24/2014 with CHF exacerbation. Assessment/Plan: Acute on Chronic systolic and diastolic CHF -Continue furosemide 80 mg IV twice a day -Daily weights -Accurate I's and O's -03/07/2014 echocardiogram EF 40-45 percent, grade 1 diastolic dysfunction -28/78/6767 Lexiscan--negative for any reversible ischemia, EF 42% -May need to consider thoracocentesis if no improvement in respiratory status -12/20/2013 thoracocentesis of pleural effusion revealed transudate  -Continue carvedilol, BiDil COPD exacerbation -The patient continues to have diffuse wheezing -Continue intravenous IV Solumedrol -Supplemental oxygen -Pulmonary hygiene Chronic right lower extremity wounds -Appears to be venous stasis ulcerations -Not infected on physical exam -Wound care consultation CKD stage III -Baseline creatinine 1.4-1.8 -Moderate renal function on diuresis Diabetes mellitus type 2  -Keep the patient on NovoLog sliding scale while he is on intravenous steroids  -CBGs normally well controlled without medications  Dementia  -Continue rivastigmine patch    HTN -controlled -continue home regimen Hyperlipidemia -cotninue statin and fenofibrate  Family Communication:   Pt at beside Disposition Plan:   Home when medically stable       Procedures/Studies: Dg Chest Port 1 View  07/10/2014   CLINICAL DATA:  Dyspnea  EXAM: PORTABLE CHEST - 1 VIEW  COMPARISON:  June 21, 2014  FINDINGS: There is a sizable pleural effusion on the right with consolidation involving the right middle and lower lobes. Left lung is clear. Heart is enlarged with pulmonary vascularity within normal limits. No adenopathy appreciable.  IMPRESSION: Persistent right effusion with consolidation involving essentially all of the right middle and lower lobes. Left lung clear. No change in cardiac silhouette.   Electronically Signed   By: Lowella Grip M.D.   On: 07/10/2014 21:25   Dg Chest Portable 1 View  06/21/2014   CLINICAL DATA:  Shortness of breath.  Bilateral leg swelling.  EXAM: PORTABLE CHEST - 1 VIEW  COMPARISON:  04/05/2014  FINDINGS: Shallow inspiration. Cardiac enlargement with pulmonary vascular congestion and perihilar edema. Moderate size right pleural effusion with basilar atelectasis. Changes are similar to those seen on previous study. No pneumothorax. Calcification of aorta.  IMPRESSION: Cardiac enlargement with pulmonary vascular congestion and perihilar edema. Right pleural effusion with basilar atelectasis.   Electronically Signed   By: Lucienne Capers M.D.   On: 06/21/2014 03:06         Subjective: Patient is presently confused, but is able to tell me that his breathing is a lot better. No reports of vomiting, respiratory distress, uncontrolled pain, diarrhea.   Objective: Filed Vitals:   07/10/14 2230 07/10/14 2310 07/11/14 0211 07/11/14 0620  BP: 131/99 124/82 128/74 128/68  Pulse: 69 62 73 76  Temp:  98 F (36.7 C) 98.4 F (36.9 C)  98.6 F (37 C)  TempSrc:  Oral Oral Oral  Resp:  20 20 18   Height:  5\' 7"  (1.702 m)    Weight:  108.9  kg (240 lb 1.3 oz)  108 kg (238 lb 1.6 oz)  SpO2: 98% 99% 100% 100%    Intake/Output Summary (Last 24 hours) at 07/11/14 0734 Last data filed at 07/11/14 8938  Gross per 24 hour  Intake      0 ml  Output    350 ml  Net   -350 ml   Weight change:  Exam:   General:  Pt is alert, follows commands appropriately, not in acute distress  HEENT: No icterus, No thrush,  [place]/AT  Cardiovascular: RRR, S1/S2, no rubs, no gallops, +JVD  Respiratory: Bibasilar crackles. Diminished breath sounds right lung. Bilateral wheezing   Abdomen: Soft/+BS, non tender, non distended, no guarding  Extremities: 2+ LE edema, No lymphangitis, No petechiae, No rashes, no synovitis; 3 separate ulcerations on the right pretibial area with serous fluid. No lymphangitis or necrosis  Data Reviewed: Basic Metabolic Panel:  Recent Labs Lab 07/10/14 2005 07/11/14 0333  NA 141 144  K 4.1 4.0  CL 100 102  CO2 28 29  GLUCOSE 175* 208*  BUN 51* 53*  CREATININE 1.72* 1.81*  CALCIUM 8.7 8.7   Liver Function Tests: No results for input(s): AST, ALT, ALKPHOS, BILITOT, PROT, ALBUMIN in the last 168 hours. No results for input(s): LIPASE, AMYLASE in the last 168 hours. No results for input(s): AMMONIA in the last 168 hours. CBC:  Recent Labs Lab 07/10/14 2005  WBC 4.7  NEUTROABS 2.8  HGB 12.0*  HCT 36.5*  MCV 98.9  PLT 168   Cardiac Enzymes: No results for input(s): CKTOTAL, CKMB, CKMBINDEX, TROPONINI in the last 168 hours. BNP: Invalid input(s): POCBNP CBG:  Recent Labs Lab 07/10/14 2026 07/11/14 0620  GLUCAP 189* 167*    No results found for this or any previous visit (from the past 240 hour(s)).   Scheduled Meds: . albuterol  2.5 mg Nebulization TID  . aspirin  324 mg Oral Once  . aspirin EC  81 mg Oral QPM  . atorvastatin  20 mg Oral QPM  . carvedilol  6.25 mg Oral BID WC  . feeding supplement (PRO-STAT SUGAR FREE 64)  30 mL Oral TID WC  . fenofibrate  160 mg Oral QPM  . furosemide   80 mg Intravenous BID  . gabapentin  200 mg Oral BID  . heparin  5,000 Units Subcutaneous 3 times per day  . insulin aspart  0-15 Units Subcutaneous TID WC  . isosorbide-hydrALAZINE  1 tablet Oral TID  . latanoprost  1 drop Left Eye QHS  . methylPREDNISolone (SOLU-MEDROL) injection  60 mg Intravenous Q12H  . mupirocin ointment  1 application Nasal BID  . rivastigmine  4.6 mg Transdermal Daily  . traZODone  50 mg Oral QHS   Continuous Infusions:    Mayetta Castleman, DO  [facility] Pager [phone]  If 7PM-7AM, please contact night-coverage [email or web address] Password [email or web address] 07/11/2014, 7:34 AM   LOS: 1 day

## 2014-07-12 LAB — BASIC METABOLIC PANEL
ANION GAP: 12 (ref 5–15)
BUN: 56 mg/dL — AB (ref 6–23)
CALCIUM: 8.5 mg/dL (ref 8.4–10.5)
CO2: 29 meq/L (ref 19–32)
CREATININE: 1.66 mg/dL — AB (ref 0.50–1.35)
Chloride: 98 mEq/L (ref 96–112)
GFR calc Af Amer: 43 mL/min — ABNORMAL LOW (ref >90)
GFR calc non Af Amer: 37 mL/min — ABNORMAL LOW (ref >90)
Glucose, Bld: 174 mg/dL — ABNORMAL HIGH (ref 70–99)
Potassium: 4.1 mEq/L (ref 3.7–5.3)
Sodium: 139 mEq/L (ref 137–147)

## 2014-07-12 LAB — GLUCOSE, CAPILLARY
GLUCOSE-CAPILLARY: 191 mg/dL — AB (ref 70–99)
GLUCOSE-CAPILLARY: 260 mg/dL — AB (ref 70–99)
Glucose-Capillary: 162 mg/dL — ABNORMAL HIGH (ref 70–99)
Glucose-Capillary: 217 mg/dL — ABNORMAL HIGH (ref 70–99)

## 2014-07-12 LAB — MAGNESIUM: Magnesium: 2.3 mg/dL (ref 1.5–2.5)

## 2014-07-12 MED ORDER — FUROSEMIDE 10 MG/ML IJ SOLN
80.0000 mg | Freq: Three times a day (TID) | INTRAMUSCULAR | Status: DC
  Administered 2014-07-12 – 2014-07-15 (×10): 80 mg via INTRAVENOUS
  Filled 2014-07-12 (×6): qty 8

## 2014-07-12 NOTE — Progress Notes (Signed)
Patient has removed gown and sheets multiple times throughout shift.  RN and NT both attempted to place clothing and covers on patient, but patient refusing at this time.  Curtain closed for privacy, bed alarm on.  Will continue to monitor.

## 2014-07-12 NOTE — Progress Notes (Signed)
PROGRESS NOTE  [name] [name] TTS:[id number] DOB: January 24, 1931 DOA: 07/10/2014 PCP: [name], NP  Brief history 80 year old male with a history of systolic and diastolic CHF, COPD, CKD stage III, diabetes mellitus type 2, dementia presents from [place] care with shortness of breath 1 day. Apparently, the nursing facility wanted to send the patient to the emergency department on the morning of 07/10/2014 because of shortness of breath. The patient's family took the patient home for Thanksgiving lunch. After he returned, the patient was even more short of breath. He presented to the emergency department where he was noted to have oxygen saturation of 90% on room air. Chest x-ray showed right pleural effusion. ProBNP was 8042. The patient was started on intravenous furosemide. The patient was recently discharged from the hospital on 06/24/2014 with CHF exacerbation. Assessment/Plan: Acute on Chronic systolic and diastolic CHF -Continue furosemide 80 mg IV twice a day -Daily weights--only -0.2 kg for the admission -Increase furosemide to 80 mg IV every 8 hours -He remains clinically volume overloaded -Accurate I's and O's -03/07/2014 echocardiogram EF 40-45 percent, grade 1 diastolic dysfunction -05/07/3006 Lexiscan--negative for any reversible ischemia, EF 42% -May need to consider thoracocentesis if no improvement in respiratory status -12/20/2013 thoracocentesis of pleural effusion revealed transudate  -Continue carvedilol, BiDil  -PT evaluation COPD exacerbation -The patient continues to have diffuse wheezing -Continue intravenous IV Solumedrol -Supplemental oxygen -Pulmonary hygiene Chronic right lower extremity wounds -Appears to be venous stasis ulcerations -Not infected on physical exam -Wound care consultation CKD stage III -Baseline creatinine 1.4-1.8 -Moderate renal function on diuresis Diabetes mellitus type 2  -Keep the patient on NovoLog sliding  scale while he is on intravenous steroids  -CBGs normally well controlled without medications  Dementia  -Continue rivastigmine patch  HTN -controlled -continue home regimen Hyperlipidemia -cotninue statin and fenofibrate Lower extremity stasis ulcers -Do not appear infected clinically -Continue current dressings without Bactroban Family Communication: Pt at beside Disposition Plan: SNF       Procedures/Studies: Dg Chest Port 1 View  07/10/2014   CLINICAL DATA:  Dyspnea  EXAM: PORTABLE CHEST - 1 VIEW  COMPARISON:  June 21, 2014  FINDINGS: There is a sizable pleural effusion on the right with consolidation involving the right middle and lower lobes. Left lung is clear. Heart is enlarged with pulmonary vascularity within normal limits. No adenopathy appreciable.  IMPRESSION: Persistent right effusion with consolidation involving essentially all of the right middle and lower lobes. Left lung clear. No change in cardiac silhouette.   Electronically Signed   By: Lowella Grip M.D.   On: 07/10/2014 21:25   Dg Chest Portable 1 View  06/21/2014   CLINICAL DATA:  Shortness of breath.  Bilateral leg swelling.  EXAM: PORTABLE CHEST - 1 VIEW  COMPARISON:  04/05/2014  FINDINGS: Shallow inspiration. Cardiac enlargement with pulmonary vascular congestion and perihilar edema. Moderate size right pleural effusion with basilar atelectasis. Changes are similar to those seen on previous study. No pneumothorax. Calcification of aorta.  IMPRESSION: Cardiac enlargement with pulmonary vascular congestion and perihilar edema. Right pleural effusion with basilar atelectasis.   Electronically Signed   By: Lucienne Capers M.D.   On: 06/21/2014 03:06        Subjective:  patient remains pleasantly confused but more alert than yesterday. No reports of vomiting, respiratory distress, uncontrolled pain, diarrhea. The patient is eating well  Objective: Filed Vitals:   07/11/14 2031 07/12/14 0100  07/12/14 0605 07/12/14  0849  BP: 118/62 134/53 132/76   Pulse: 64 68 70   Temp: 98.2 F (36.8 C) 98.3 F (36.8 C) 98 F (36.7 C)   TempSrc: Oral Axillary Oral   Resp: 20 18 20    Height:      Weight:   107.88 kg (237 lb 13.3 oz)   SpO2: 96% 100% 93% 96%    Intake/Output Summary (Last 24 hours) at 07/12/14 1500 Last data filed at 07/12/14 0955  Gross per 24 hour  Intake    960 ml  Output    875 ml  Net     85 ml   Weight change: -1.02 kg (-2 lb 4 oz) Exam:   General:  Pt is alert, follows commands appropriately, not in acute distress  HEENT: No icterus, No thrush, No neck mass, [place]/AT  Cardiovascular: RRR, S1/S2, no rubs, no gallops  Respiratory: bibasilar crackles. Bilateral expiratory wheeze.   Abdomen: Soft/+BS, non tender, non distended, no guarding  Extremities: 2+ edema, No lymphangitis, No petechiae, No rashes, no synovitis  Data Reviewed: Basic Metabolic Panel:  Recent Labs Lab 07/10/14 2005 07/11/14 0333 07/12/14 0254  NA 141 144 139  K 4.1 4.0 4.1  CL 100 102 98  CO2 28 29 29   GLUCOSE 175* 208* 174*  BUN 51* 53* 56*  CREATININE 1.72* 1.81* 1.66*  CALCIUM 8.7 8.7 8.5  MG  --   --  2.3   Liver Function Tests: No results for input(s): AST, ALT, ALKPHOS, BILITOT, PROT, ALBUMIN in the last 168 hours. No results for input(s): LIPASE, AMYLASE in the last 168 hours. No results for input(s): AMMONIA in the last 168 hours. CBC:  Recent Labs Lab 07/10/14 2005  WBC 4.7  NEUTROABS 2.8  HGB 12.0*  HCT 36.5*  MCV 98.9  PLT 168   Cardiac Enzymes: No results for input(s): CKTOTAL, CKMB, CKMBINDEX, TROPONINI in the last 168 hours. BNP: Invalid input(s): POCBNP CBG:  Recent Labs Lab 07/11/14 1122 07/11/14 1656 07/11/14 2138 07/12/14 0653 07/12/14 1126  GLUCAP 180* 115* 159* 162* 191*    No results found for this or any previous visit (from the past 240 hour(s)).   Scheduled Meds: . albuterol  2.5 mg Nebulization TID  . aspirin  324 mg  Oral Once  . aspirin EC  81 mg Oral QPM  . atorvastatin  20 mg Oral QPM  . carvedilol  6.25 mg Oral BID WC  . feeding supplement (PRO-STAT SUGAR FREE 64)  30 mL Oral TID WC  . fenofibrate  160 mg Oral QPM  . furosemide  80 mg Intravenous Q8H  . gabapentin  200 mg Oral BID  . heparin  5,000 Units Subcutaneous 3 times per day  . insulin aspart  0-15 Units Subcutaneous TID WC  . isosorbide-hydrALAZINE  1 tablet Oral TID  . latanoprost  1 drop Left Eye QHS  . methylPREDNISolone (SOLU-MEDROL) injection  60 mg Intravenous Q8H  . mupirocin ointment  1 application Nasal BID  . rivastigmine  4.6 mg Transdermal Daily  . traZODone  50 mg Oral QHS   Continuous Infusions:    Liron Eissler, DO  [facility] Pager [phone]  If 7PM-7AM, please contact night-coverage [email or web address] Password [email or web address] 07/12/2014, 3:00 PM   LOS: 2 days

## 2014-07-12 NOTE — Progress Notes (Signed)
Pharmacist Heart Failure Core Measure Documentation  Assessment: [name] [name] has an EF documented as 40-45% on 03/07/14 by ECHO.  Rationale: Heart failure patients with left ventricular systolic dysfunction (LVSD) and an EF < 40% should be prescribed an angiotensin converting enzyme inhibitor (ACEI) or angiotensin receptor blocker (ARB) at discharge unless a contraindication is documented in the medical record.  This patient is not currently on an ACEI or ARB for HF.  This note is being placed in the record in order to provide documentation that a contraindication to the use of these agents is present for this encounter.  ACE Inhibitor or Angiotensin Receptor Blocker is contraindicated (specify all that apply)  []   ACEI allergy AND ARB allergy []   Angioedema []   Moderate or severe aortic stenosis []   Hyperkalemia []   Hypotension []   Renal artery stenosis [x]   Worsening renal function, preexisting renal disease or dysfunction   [name] 07/12/2014 3:25 PM

## 2014-07-13 DIAGNOSIS — N189 Chronic kidney disease, unspecified: Secondary | ICD-10-CM

## 2014-07-13 DIAGNOSIS — E1122 Type 2 diabetes mellitus with diabetic chronic kidney disease: Secondary | ICD-10-CM

## 2014-07-13 LAB — GLUCOSE, CAPILLARY
GLUCOSE-CAPILLARY: 178 mg/dL — AB (ref 70–99)
Glucose-Capillary: 203 mg/dL — ABNORMAL HIGH (ref 70–99)
Glucose-Capillary: 277 mg/dL — ABNORMAL HIGH (ref 70–99)
Glucose-Capillary: 329 mg/dL — ABNORMAL HIGH (ref 70–99)
Glucose-Capillary: 235 mg/dL — ABNORMAL HIGH (ref 70–99)

## 2014-07-13 LAB — BASIC METABOLIC PANEL
Anion gap: 11 (ref 5–15)
BUN: 62 mg/dL — ABNORMAL HIGH (ref 6–23)
CHLORIDE: 100 meq/L (ref 96–112)
CO2: 30 meq/L (ref 19–32)
CREATININE: 1.69 mg/dL — AB (ref 0.50–1.35)
Calcium: 8.5 mg/dL (ref 8.4–10.5)
GFR calc Af Amer: 42 mL/min — ABNORMAL LOW (ref >90)
GFR calc non Af Amer: 36 mL/min — ABNORMAL LOW (ref >90)
Glucose, Bld: 190 mg/dL — ABNORMAL HIGH (ref 70–99)
Potassium: 4.1 mEq/L (ref 3.7–5.3)
Sodium: 141 mEq/L (ref 137–147)

## 2014-07-13 MED ORDER — INSULIN ASPART 100 UNIT/ML ~~LOC~~ SOLN
5.0000 [IU] | Freq: Once | SUBCUTANEOUS | Status: AC
  Administered 2014-07-13: 5 [IU] via SUBCUTANEOUS

## 2014-07-13 NOTE — Progress Notes (Signed)
PROGRESS NOTE  [name] [name] WRU:[id number] DOB: 01-01-1931 DOA: 07/10/2014 PCP: [name], NP  Brief history 80 year old male with a history of systolic and diastolic CHF, COPD, CKD stage III, diabetes mellitus type 2, dementia presents from [place] care with shortness of breath 1 day. Apparently, the nursing facility wanted to send the patient to the emergency department on the morning of 07/10/2014 because of shortness of breath. The patient's family took the patient home for Thanksgiving lunch. After he returned, the patient was even more short of breath. He presented to the emergency department where he was noted to have oxygen saturation of 90% on room air. Chest x-ray showed right pleural effusion. ProBNP was 8042. The patient was started on intravenous furosemide. The patient was recently discharged from the hospital on 06/24/2014 with CHF exacerbation. Assessment/Plan: Acute on Chronic systolic and diastolic CHF -Increased furosemide 80 mg IV three times a day -Daily weights--??gained 0.6 kg since yesterday -He remains clinically volume overloaded -Accurate I's and O's -03/07/2014 echocardiogram EF 40-45 percent, grade 1 diastolic dysfunction -91/47/8295 Lexiscan--negative for any reversible ischemia, EF 42% -May need to consider thoracocentesis if no improvement in respiratory status -12/20/2013 thoracocentesis of pleural effusion revealed transudate  -Continue carvedilol, BiDil  -PT evaluation COPD exacerbation -Wheezing is improving -Continue intravenous IV Solumedrol -Supplemental oxygen -Pulmonary hygiene Chronic right lower extremity wounds -Appears to be venous stasis ulcerations -Not infected on physical exam -Wound care consultation CKD stage III -Baseline creatinine 1.4-1.8 -Moderate renal function on diuresis Diabetes mellitus type 2  -Keep the patient on NovoLog sliding scale while he is on intravenous steroids  -CBGs normally well  controlled without medications  Dementia  -Continue rivastigmine patch  HTN -controlled -continue home regimen Hyperlipidemia -cotninue statin and fenofibrate Lower extremity stasis ulcers -Do not appear infected clinically -Continue current dressings without Bactroban Family Communication: Pt at beside Disposition Plan: SNF  Procedures/Studies: Dg Chest Port 1 View  07/10/2014   CLINICAL DATA:  Dyspnea  EXAM: PORTABLE CHEST - 1 VIEW  COMPARISON:  June 21, 2014  FINDINGS: There is a sizable pleural effusion on the right with consolidation involving the right middle and lower lobes. Left lung is clear. Heart is enlarged with pulmonary vascularity within normal limits. No adenopathy appreciable.  IMPRESSION: Persistent right effusion with consolidation involving essentially all of the right middle and lower lobes. Left lung clear. No change in cardiac silhouette.   Electronically Signed   By: Lowella Grip M.D.   On: 07/10/2014 21:25   Dg Chest Portable 1 View  06/21/2014   CLINICAL DATA:  Shortness of breath.  Bilateral leg swelling.  EXAM: PORTABLE CHEST - 1 VIEW  COMPARISON:  04/05/2014  FINDINGS: Shallow inspiration. Cardiac enlargement with pulmonary vascular congestion and perihilar edema. Moderate size right pleural effusion with basilar atelectasis. Changes are similar to those seen on previous study. No pneumothorax. Calcification of aorta.  IMPRESSION: Cardiac enlargement with pulmonary vascular congestion and perihilar edema. Right pleural effusion with basilar atelectasis.   Electronically Signed   By: Lucienne Capers M.D.   On: 06/21/2014 03:06         Subjective: Patient is more alert today. He states that his breathing is better than the time of admission. Denies any chest pain, nausea, vomiting, diarrhea, abdominal pain. He is eating well.  Objective: Filed Vitals:   07/12/14 2036 07/13/14 0145 07/13/14 0439 07/13/14 0758  BP:  139/51 118/69   Pulse:   61  68   Temp:  97.5 F (36.4 C) 97.9 F (36.6 C)   TempSrc:  Axillary Oral   Resp:  20 20   Height:      Weight:   108.4 kg (238 lb 15.7 oz)   SpO2: 95% 100% 100% 97%    Intake/Output Summary (Last 24 hours) at 07/13/14 1202 Last data filed at 07/13/14 5916  Gross per 24 hour  Intake    240 ml  Output   1375 ml  Net  -1135 ml   Weight change: 0.52 kg (1 lb 2.3 oz) Exam:   General:  Pt is alert, follows commands appropriately, not in acute distress  HEENT: No icterus, No thrush, No neck mass, [place]/AT  Cardiovascular: RRR, S1/S2, no rubs, no gallops  Respiratory: Bibasilar crackles. Minimal basilar wheezing. Good air movement.  Abdomen: Soft/+BS, non tender, non distended, no guarding  Extremities: 2+LE edema, No lymphangitis, No petechiae, No rashes, no synovitis; 3 separate ulcerations on the right pretibial area with serous fluid. No lymphangitis or necrosis  Data Reviewed: Basic Metabolic Panel:  Recent Labs Lab 07/10/14 2005 07/11/14 0333 07/12/14 0254 07/13/14 0319  NA 141 144 139 141  K 4.1 4.0 4.1 4.1  CL 100 102 98 100  CO2 28 29 29 30   GLUCOSE 175* 208* 174* 190*  BUN 51* 53* 56* 62*  CREATININE 1.72* 1.81* 1.66* 1.69*  CALCIUM 8.7 8.7 8.5 8.5  MG  --   --  2.3  --    Liver Function Tests: No results for input(s): AST, ALT, ALKPHOS, BILITOT, PROT, ALBUMIN in the last 168 hours. No results for input(s): LIPASE, AMYLASE in the last 168 hours. No results for input(s): AMMONIA in the last 168 hours. CBC:  Recent Labs Lab 07/10/14 2005  WBC 4.7  NEUTROABS 2.8  HGB 12.0*  HCT 36.5*  MCV 98.9  PLT 168   Cardiac Enzymes: No results for input(s): CKTOTAL, CKMB, CKMBINDEX, TROPONINI in the last 168 hours. BNP: Invalid input(s): POCBNP CBG:  Recent Labs Lab 07/12/14 0653 07/12/14 1126 07/12/14 1648 07/12/14 2047 07/13/14 0650  GLUCAP 162* 191* 260* 217* 178*    No results found for this or any previous visit (from the past 240 hour(s)).     Scheduled Meds: . albuterol  2.5 mg Nebulization TID  . aspirin  324 mg Oral Once  . aspirin EC  81 mg Oral QPM  . atorvastatin  20 mg Oral QPM  . carvedilol  6.25 mg Oral BID WC  . feeding supplement (PRO-STAT SUGAR FREE 64)  30 mL Oral TID WC  . fenofibrate  160 mg Oral QPM  . furosemide  80 mg Intravenous Q8H  . gabapentin  200 mg Oral BID  . heparin  5,000 Units Subcutaneous 3 times per day  . insulin aspart  0-15 Units Subcutaneous TID WC  . isosorbide-hydrALAZINE  1 tablet Oral TID  . latanoprost  1 drop Left Eye QHS  . methylPREDNISolone (SOLU-MEDROL) injection  60 mg Intravenous Q8H  . mupirocin ointment  1 application Nasal BID  . rivastigmine  4.6 mg Transdermal Daily  . traZODone  50 mg Oral QHS   Continuous Infusions:    Mieke Brinley, DO  [facility] Pager [phone]  If 7PM-7AM, please contact night-coverage [email or web address] Password [email or web address] 07/13/2014, 12:02 PM   LOS: 3 days

## 2014-07-13 NOTE — Progress Notes (Signed)
Patient is resting comfortably. Bed alarm is in place secondary to patient's disorientation to place and time. Patient maintained on telemetry and is in NSR. Patient does have a condom catheter on r/t being incontinent of urine, and being a high fall risk. Will continue to monitor.  Esperanza Heir, RN

## 2014-07-13 NOTE — Evaluation (Signed)
Physical Therapy Evaluation Patient Details Name: [name] [name] MRN: [id number] DOB: [birth year] Today's Date: 07/13/2014   History of Present Illness  80 year old male with a history of systolic and diastolic CHF, COPD, CKD stage III, diabetes mellitus type 2, dementia presents from [place] care with shortness of breath 1 day. Apparently, the nursing facility wanted to send the patient to the emergency department on the morning of 07/10/2014 because of shortness of breath. The patient's family took the patient home for Thanksgiving lunch. After he returned, the patient was even more short of breath. He presented to the emergency department where he was noted to have oxygen saturation of 90% on room air. Chest x-ray showed right pleural effusion. ProBNP was 8042. The patient was started on intravenous furosemide. The patient was recently discharged from the hospital on 06/24/2014 with CHF exacerbation.  Clinical Impression  Pt admitted with/for SOB due to progression of CHF and COPD.  Pt currently limited functionally due to the problems listed. ( See problems list.)   Pt will benefit from PT to maximize function and safety in order to get ready for next venue listed below.     Follow Up Recommendations SNF    Equipment Recommendations  None recommended by PT    Recommendations for Other Services       Precautions / Restrictions Precautions Precautions: Fall      Mobility  Bed Mobility               General bed mobility comments: not assessed today  Transfers Overall transfer level: Needs assistance Equipment used: Rolling walker (2 wheeled) Transfers: Sit to/from Stand Sit to Stand: Min assist         General transfer comment: cues for hand placement and assist to come forward.  Ambulation/Gait Ambulation/Gait assistance: Min assist Ambulation Distance (Feet): 35 Feet Assistive device: Rolling walker (2 wheeled) Gait Pattern/deviations: Step-through  pattern;Wide base of support Gait velocity: decr   General Gait Details: cues for better use of the RW, and postural cues.    Stairs            Wheelchair Mobility    Modified Rankin (Stroke Patients Only)       Balance Overall balance assessment: Needs assistance Sitting-balance support: No upper extremity supported Sitting balance-Leahy Scale: Fair     Standing balance support: No upper extremity supported;Bilateral upper extremity supported Standing balance-Leahy Scale: Poor Standing balance comment: Needs RW for safety                             Pertinent Vitals/Pain Pain Assessment: No/denies pain    Home Living Family/patient expects to be discharged to:: Skilled nursing facility                      Prior Function Level of Independence: Needs assistance   Gait / Transfers Assistance Needed: Pt is a poor historian-- it appears he uses w/c most of the time and is supervised walking short distances.  ADL's / Homemaking Assistance Needed: pt reports set up for bathing, dressing        Hand Dominance        Extremity/Trunk Assessment   Upper Extremity Assessment: Defer to OT evaluation           Lower Extremity Assessment: Generalized weakness;RLE deficits/detail RLE Deficits / Details: more stiff and weaker than L LE       Communication   Communication: No  difficulties  Cognition Arousal/Alertness: Awake/alert Behavior During Therapy: WFL for tasks assessed/performed Overall Cognitive Status: No family/caregiver present to determine baseline cognitive functioning                      General Comments      Exercises        Assessment/Plan    PT Assessment Patient needs continued PT services  PT Diagnosis Difficulty walking;Generalized weakness   PT Problem List Decreased strength;Decreased activity tolerance;Decreased balance;Decreased mobility;Decreased knowledge of use of DME;Cardiopulmonary status  limiting activity  PT Treatment Interventions DME instruction;Gait training;Functional mobility training;Therapeutic activities;Balance training;Patient/family education   PT Goals (Current goals can be found in the Care Plan section) Acute Rehab PT Goals Patient Stated Goal: Not stated but agreeable to work on incr mobility PT Goal Formulation: With patient Time For Goal Achievement: 07/20/14 Potential to Achieve Goals: Good    Frequency Min 2X/week   Barriers to discharge        Co-evaluation               End of Session Equipment Utilized During Treatment: Oxygen Activity Tolerance: Patient tolerated treatment well Patient left: in chair;with call bell/phone within reach;with nursing/sitter in room Nurse Communication: Mobility status         Time: 5974-1638 PT Time Calculation (min) (ACUTE ONLY): 24 min   Charges:   PT Evaluation $Initial PT Evaluation Tier I: 1 Procedure PT Treatments $Gait Training: 8-22 mins   PT G Codes:          Robecca Fulgham, Tessie Fass 07/13/2014, 1:43 PM 07/13/2014  Donnella Sham, PT [phone]  (pager)

## 2014-07-13 NOTE — Progress Notes (Signed)
Pt sitting up in chair, Md at the bedside. No concerns or complaints at this time. Will cont to monitor.

## 2014-07-14 LAB — GLUCOSE, CAPILLARY
GLUCOSE-CAPILLARY: 168 mg/dL — AB (ref 70–99)
Glucose-Capillary: 254 mg/dL — ABNORMAL HIGH (ref 70–99)
Glucose-Capillary: 298 mg/dL — ABNORMAL HIGH (ref 70–99)

## 2014-07-14 LAB — BASIC METABOLIC PANEL
Anion gap: 9 (ref 5–15)
BUN: 71 mg/dL — ABNORMAL HIGH (ref 6–23)
CO2: 33 mEq/L — ABNORMAL HIGH (ref 19–32)
Calcium: 8.3 mg/dL — ABNORMAL LOW (ref 8.4–10.5)
Chloride: 99 mEq/L (ref 96–112)
Creatinine, Ser: 1.77 mg/dL — ABNORMAL HIGH (ref 0.50–1.35)
GFR calc Af Amer: 40 mL/min — ABNORMAL LOW (ref >90)
GFR, EST NON AFRICAN AMERICAN: 34 mL/min — AB (ref >90)
GLUCOSE: 162 mg/dL — AB (ref 70–99)
Potassium: 4.2 mEq/L (ref 3.7–5.3)
SODIUM: 141 meq/L (ref 137–147)

## 2014-07-14 LAB — HEMOGLOBIN A1C
Hgb A1c MFr Bld: 5.7 % — ABNORMAL HIGH (ref <5.7)
Mean Plasma Glucose: 117 mg/dL — ABNORMAL HIGH (ref <117)

## 2014-07-14 MED ORDER — METHYLPREDNISOLONE SODIUM SUCC 125 MG IJ SOLR
60.0000 mg | Freq: Two times a day (BID) | INTRAMUSCULAR | Status: AC
  Administered 2014-07-14 – 2014-07-15 (×3): 60 mg via INTRAVENOUS
  Filled 2014-07-14 (×3): qty 0.96
  Filled 2014-07-14: qty 2
  Filled 2014-07-14: qty 0.96

## 2014-07-14 MED ORDER — INSULIN GLARGINE 100 UNIT/ML ~~LOC~~ SOLN
5.0000 [IU] | Freq: Every day | SUBCUTANEOUS | Status: DC
  Administered 2014-07-14: 5 [IU] via SUBCUTANEOUS
  Filled 2014-07-14 (×2): qty 0.05

## 2014-07-14 NOTE — Progress Notes (Signed)
Coude placement attempted x2 by RNs from 6N.  Dr. Carles Collet made aware.  Per Dr. Carles Collet, urology notified.  Will await placement of indwelling catheter and continue to monitor.

## 2014-07-14 NOTE — Progress Notes (Signed)
Inpatient Diabetes Program Recommendations  AACE/ADA: New Consensus Statement on Inpatient Glycemic Control (2013)  Target Ranges:  Prepandial:   less than 140 mg/dL      Peak postprandial:   less than 180 mg/dL (1-2 hours)      Critically ill patients:  140 - 180 mg/dL   Inpatient Diabetes Program Recommendations Insulin - Basal: May want  to add low dose levemir while on Solumedrol, as Levemir 10 units to start. (May help prevent such elevated blood sugars which will require higher doses of correction)  Thank you, Rosita Kea, RN, CNS, Diabetes Coordinator [phone])

## 2014-07-14 NOTE — Progress Notes (Signed)
PROGRESS NOTE  [name] [name] LKG:[id number] DOB: Jun 30, 1931 DOA: 07/10/2014 PCP: [name], NP    Brief history 80 year old male with a history of systolic and diastolic CHF, COPD, CKD stage III, diabetes mellitus type 2, dementia presents from [place] care with shortness of breath 1 day. Apparently, the nursing facility wanted to send the patient to the emergency department on the morning of 07/10/2014 because of shortness of breath. The patient's family took the patient home for Thanksgiving lunch. After he returned, the patient was even more short of breath. He presented to the emergency department where he was noted to have oxygen saturation of 90% on room air. Chest x-ray showed right pleural effusion. ProBNP was 8042. The patient was started on intravenous furosemide. The patient was recently discharged from the hospital on 06/24/2014 with CHF exacerbation. Assessment/Plan: Acute on Chronic systolic and diastolic CHF -Increased furosemide 80 mg IV three times a day -Daily weights--??accuracy -He remains clinically volume overloaded -Accurate I's and O's -03/07/2014 echocardiogram EF 40-45 percent, grade 1 diastolic dysfunction -66/44/0347 Lexiscan--negative for any reversible ischemia, EF 42% -May need to consider thoracocentesis if no improvement in respiratory status -12/20/2013 thoracocentesis of pleural effusion revealed transudate  -Continue carvedilol, BiDil  -PT evaluation -CXR and proBNP in am COPD exacerbation -Wheezing is improving -Continue intravenous IV Solumedrol-->start to wean -Supplemental oxygen -Pulmonary hygiene Chronic right lower extremity wounds -Appears to be venous stasis ulcerations -Not infected on physical exam -Wound care consultation CKD stage III -Baseline creatinine 1.4-1.8 -Moderate renal function on diuresis Diabetes mellitus type 2  -Keep the patient on NovoLog sliding scale while he is on intravenous steroids   -CBGs normally well controlled without medications  -As the patient is on intravenous steroids, start Levemir 5 units daily, but do not anticipate that the patient will need this long-term as he was well controlled without any agents prior to hospitalization. Dementia  -Continue rivastigmine patch  HTN -controlled -continue home regimen Hyperlipidemia -cotninue statin and fenofibrate Lower extremity stasis ulcers -Do not appear infected clinically -Continue current dressings without Bactroban Urinary retention -Bladder scan revealed greater than 700 mL -RN unable to place traditional foley -try coude cath Family Communication: Pt at beside Disposition Plan: SNF      Procedures/Studies: Dg Chest Port 1 View  07/10/2014   CLINICAL DATA:  Dyspnea  EXAM: PORTABLE CHEST - 1 VIEW  COMPARISON:  June 21, 2014  FINDINGS: There is a sizable pleural effusion on the right with consolidation involving the right middle and lower lobes. Left lung is clear. Heart is enlarged with pulmonary vascularity within normal limits. No adenopathy appreciable.  IMPRESSION: Persistent right effusion with consolidation involving essentially all of the right middle and lower lobes. Left lung clear. No change in cardiac silhouette.   Electronically Signed   By: Lowella Grip M.D.   On: 07/10/2014 21:25   Dg Chest Portable 1 View  06/21/2014   CLINICAL DATA:  Shortness of breath.  Bilateral leg swelling.  EXAM: PORTABLE CHEST - 1 VIEW  COMPARISON:  04/05/2014  FINDINGS: Shallow inspiration. Cardiac enlargement with pulmonary vascular congestion and perihilar edema. Moderate size right pleural effusion with basilar atelectasis. Changes are similar to those seen on previous study. No pneumothorax. Calcification of aorta.  IMPRESSION: Cardiac enlargement with pulmonary vascular congestion and perihilar edema. Right pleural effusion with basilar atelectasis.   Electronically Signed   By: Lucienne Capers  M.D.   On: 06/21/2014 03:06  Subjective: Patient is breathing better. Denies any fevers, chills, chest pain, short of breath, nausea, vomiting, diarrhea, abdominal pain.  Objective: Filed Vitals:   07/13/14 2002 07/14/14 0454 07/14/14 1041 07/14/14 1139  BP: 144/70 135/55  143/57  Pulse: 66 56  67  Temp: 97.3 F (36.3 C) 97.5 F (36.4 C)    TempSrc: Oral Axillary    Resp: 20 20  18   Height:      Weight:  108.4 kg (238 lb 15.7 oz)    SpO2: 93% 100% 99% 99%    Intake/Output Summary (Last 24 hours) at 07/14/14 1246 Last data filed at 07/14/14 1212  Gross per 24 hour  Intake   1530 ml  Output    976 ml  Net    554 ml   Weight change: 0 kg (0 lb) Exam:   General:  Pt is alert, follows commands appropriately, not in acute distress  HEENT: No icterus, No thrush,[place]/AT  Cardiovascular: RRR, S1/S2, no rubs, no gallops  Respiratory: Bibasilar crackles but no wheezing  Abdomen: Soft/+BS, non tender, non distended, no guarding  Extremities: 2+LE edema, No lymphangitis, No petechiae, No rashes, no synovitis  Data Reviewed: Basic Metabolic Panel:  Recent Labs Lab 07/10/14 2005 07/11/14 0333 07/12/14 0254 07/13/14 0319 07/14/14 0444  NA 141 144 139 141 141  K 4.1 4.0 4.1 4.1 4.2  CL 100 102 98 100 99  CO2 28 29 29 30  33*  GLUCOSE 175* 208* 174* 190* 162*  BUN 51* 53* 56* 62* 71*  CREATININE 1.72* 1.81* 1.66* 1.69* 1.77*  CALCIUM 8.7 8.7 8.5 8.5 8.3*  MG  --   --  2.3  --   --    Liver Function Tests: No results for input(s): AST, ALT, ALKPHOS, BILITOT, PROT, ALBUMIN in the last 168 hours. No results for input(s): LIPASE, AMYLASE in the last 168 hours. No results for input(s): AMMONIA in the last 168 hours. CBC:  Recent Labs Lab 07/10/14 2005  WBC 4.7  NEUTROABS 2.8  HGB 12.0*  HCT 36.5*  MCV 98.9  PLT 168   Cardiac Enzymes: No results for input(s): CKTOTAL, CKMB, CKMBINDEX, TROPONINI in the last 168 hours. BNP: Invalid input(s):  POCBNP CBG:  Recent Labs Lab 07/13/14 1658 07/13/14 2102 07/13/14 2318 07/14/14 0640 07/14/14 1121  GLUCAP 277* 329* 235* 168* 254*    No results found for this or any previous visit (from the past 240 hour(s)).   Scheduled Meds: . albuterol  2.5 mg Nebulization TID  . aspirin  324 mg Oral Once  . aspirin EC  81 mg Oral QPM  . atorvastatin  20 mg Oral QPM  . carvedilol  6.25 mg Oral BID WC  . feeding supplement (PRO-STAT SUGAR FREE 64)  30 mL Oral TID WC  . fenofibrate  160 mg Oral QPM  . furosemide  80 mg Intravenous Q8H  . gabapentin  200 mg Oral BID  . heparin  5,000 Units Subcutaneous 3 times per day  . insulin aspart  0-15 Units Subcutaneous TID WC  . insulin glargine  5 Units Subcutaneous QHS  . isosorbide-hydrALAZINE  1 tablet Oral TID  . latanoprost  1 drop Left Eye QHS  . methylPREDNISolone (SOLU-MEDROL) injection  60 mg Intravenous Q8H  . mupirocin ointment  1 application Nasal BID  . rivastigmine  4.6 mg Transdermal Daily  . traZODone  50 mg Oral QHS   Continuous Infusions:    Jamita Mckelvin, DO  [facility] Pager [phone]  If 7PM-7AM,  please contact night-coverage [email or web address] Password [email or web address] 07/14/2014, 12:46 PM   LOS: 4 days

## 2014-07-14 NOTE — Progress Notes (Signed)
Attempted placement of 16Fr coude catheter. Persistence resistance met. Unsuccessful attempt. Assigned RN notified.

## 2014-07-14 NOTE — Progress Notes (Signed)
Attempted to place indwelling catheter, attempt unsuccessful.  Dr. Carles Collet made aware; coude catheter ordered.  Coude cath team member notified and supplies ordered.  Will continue to monitor.

## 2014-07-14 NOTE — Progress Notes (Signed)
Coude cath team member at bedside to attempt placement of coude.  Unsuccessful attempt.  Will await additional RN team member to attempt.

## 2014-07-14 NOTE — Progress Notes (Signed)
Notified by RN earlier pt had urine retention -bladder scan revealed 711cc -pt continues on lasix IV every 8 hours -RN reports pt only able to urinate small amounts -called [facility] for assistance in placement of urethral catheter  DTat

## 2014-07-14 NOTE — Consult Note (Signed)
Subjective: I was asked to see [name] in consultation by Dr. Carles Collet for foley placement.   He is in retention with 836ml on bladder scan and attempts at straight and coude foley placement failed.   The patient provides a poor history but doesn't report prior GU surgery.   ROS:  Review of Systems  Unable to perform ROS: dementia   No Known Allergies  Past Medical History  Diagnosis Date  . Hypertension   . Diabetes mellitus     a. Diet-controlled.  . Arthritis   . Chronic diastolic CHF (congestive heart failure)     a. Primarily diastolic CHF & RV dysfunction but systolic dysfunction. 2D Echo 03/07/14 showed EF 40-45%, diffuse HK, grade 1 diastolic dysfunction, mild MR/AI, mod dilated LA/RA, mildly dilated RV, PA pressure 37mHg. Lexiscan nuc 02/2014: EF 43% with no ischemia, inferior hypokinesis with "inferior attenuation." Dr. Aundra Dubin suspected probable prior inferior MI.  Marland Kitchen COPD (chronic obstructive pulmonary disease)   . Hyperlipidemia   . CKD (chronic kidney disease), stage III     renal insuficiency  . Inferior MI     a. Based on nuc results 02/2014, Dr. Aundra Dubin suspected probable prior inferior MI (EF 43% with no ischemia, inferior hypokinesis with "inferior attenuation").  . Dementia   . PAD (peripheral artery disease)     a. ABI 02/2014: R 0.45, L 0.56. R foot nonhealing ulcers.  . Mild mitral regurgitation   . Mild AI (aortic insufficiency)   . Lung disease     a. Possible interstitial lung disease: Followed by [name]. PFTs (1/15) with FVC 53%, FEV1 46%, ratio 85%, TLC 62%, DLCO 45% (restrictive defect).  . Human metapneumovirus pneumonia     a. Metapneumovirus PNA in 5/15 with acute respiratory failure and intubation. Bilateral transudative pleural effusions.    Past Surgical History  Procedure Laterality Date  . Leg surgery      both legs - unsure of exact operations - from trauma  . Tonsillectomy    . Appendectomy    . Cataract extraction w/phaco Left  09/04/2013    Procedure: CATARACT EXTRACTION PHACO AND INTRAOCULAR LENS PLACEMENT (IOC);  Surgeon: Adonis Brook, MD;  Location: [facility];  Service: Ophthalmology;  Laterality: Left;    History   Social History  . Marital Status: Single    Spouse Name: N/A    Number of Children: N/A  . Years of Education: N/A   Occupational History  . Not on file.   Social History Main Topics  . Smoking status: Former Smoker -- 0.50 packs/day for 70 years    Types: Cigarettes    Quit date: 05/14/2009  . Smokeless tobacco: Never Used  . Alcohol Use: No  . Drug Use: No     Comment: quit marijuana 3-4 years ago  . Sexual Activity: No   Other Topics Concern  . Not on file   Social History Narrative    Family History  Problem Relation Age of Onset  . Diabetes Father   . Diabetes Mother     Anti-infectives: Anti-infectives    None      Current Facility-Administered Medications  Medication Dose Route Frequency Provider Last Rate Last Dose  . acetaminophen (TYLENOL) tablet 500 mg  500 mg Oral Q6H PRN Etta Quill, DO   500 mg at 07/11/14 0205  . albuterol (PROVENTIL) (2.5 MG/3ML) 0.083% nebulizer solution 2.5 mg  2.5 mg Nebulization TID Etta Quill, DO   2.5 mg at 07/14/14  1620  . albuterol (PROVENTIL) (2.5 MG/3ML) 0.083% nebulizer solution 2.5 mg  2.5 mg Nebulization Q4H PRN Etta Quill, DO      . aspirin chewable tablet 324 mg  324 mg Oral Once Kristen N Ward, DO   324 mg at 07/10/14 2046  . aspirin EC tablet 81 mg  81 mg Oral QPM Etta Quill, DO   81 mg at 07/14/14 1716  . atorvastatin (LIPITOR) tablet 20 mg  20 mg Oral QPM Etta Quill, DO   20 mg at 07/14/14 1716  . carvedilol (COREG) tablet 6.25 mg  6.25 mg Oral BID WC Etta Quill, DO   6.25 mg at 07/14/14 1719  . feeding supplement (PRO-STAT SUGAR FREE 64) liquid 30 mL  30 mL Oral TID WC Etta Quill, DO   30 mL at 07/14/14 1200  . fenofibrate tablet 160 mg  160 mg Oral QPM Etta Quill, DO   160 mg at  07/14/14 1716  . furosemide (LASIX) injection 80 mg  80 mg Intravenous Q8H Orson Eva, MD   80 mg at 07/14/14 1203  . gabapentin (NEURONTIN) capsule 200 mg  200 mg Oral BID Etta Quill, DO   200 mg at 07/14/14 1157  . heparin injection 5,000 Units  5,000 Units Subcutaneous 3 times per day Etta Quill, DO   5,000 Units at 07/14/14 1357  . insulin aspart (novoLOG) injection 0-15 Units  0-15 Units Subcutaneous TID WC Etta Quill, DO   8 Units at 07/14/14 1716  . insulin glargine (LANTUS) injection 5 Units  5 Units Subcutaneous QHS Orson Eva, MD      . isosorbide-hydrALAZINE (BIDIL) 20-37.5 MG per tablet 1 tablet  1 tablet Oral TID Etta Quill, DO   1 tablet at 07/14/14 1719  . latanoprost (XALATAN) 0.005 % ophthalmic solution 1 drop  1 drop Left Eye QHS Etta Quill, DO   1 drop at 07/13/14 2221  . methylPREDNISolone sodium succinate (SOLU-MEDROL) 125 mg/2 mL injection 60 mg  60 mg Intravenous Q12H David Tat, MD      . mupirocin ointment (BACTROBAN) 2 % 1 application  1 application Nasal BID Etta Quill, DO   1 application at 43/32/95 2222  . rivastigmine (EXELON) 4.6 mg/24hr 4.6 mg  4.6 mg Transdermal Daily Etta Quill, DO   4.6 mg at 07/14/14 1000  . traZODone (DESYREL) tablet 50 mg  50 mg Oral QHS Etta Quill, DO   50 mg at 07/13/14 2221     Objective: Vital signs in last 24 hours: Temp:  [97.3 F (36.3 C)-97.5 F (36.4 C)] 97.3 F (36.3 C) (11/30 1555) Pulse Rate:  [56-67] 65 (11/30 1718) Resp:  [18-20] 18 (11/30 1718) BP: (135-164)/(55-70) 164/60 mmHg (11/30 1718) SpO2:  [93 %-100 %] 99 % (11/30 1718) Weight:  [108.4 kg (238 lb 15.7 oz)] 108.4 kg (238 lb 15.7 oz) (11/30 0454)  Intake/Output from previous day: 11/29 0701 - 11/30 0700 In: 1500 [P.O.:1500] Out: 1176 [Urine:1175; Stool:1] Intake/Output this shift: Total I/O In: 630 [P.O.:630] Out: 50 [Urine:50]   Physical Exam  Constitutional: He is well-developed, well-nourished, and in no distress.   Abdominal: Soft. He exhibits no mass. There is no tenderness.  Genitourinary: Penis normal.  Uncircumcised without phimosis.   Scrotum edematous  Musculoskeletal: He exhibits edema.  Neurological:  Very solumnent    Lab Results:  No results for input(s): WBC, HGB, HCT, PLT in the last 72 hours. BMET  Recent Labs  07/13/14 0319 07/14/14 0444  NA 141 141  K 4.1 4.2  CL 100 99  CO2 30 33*  GLUCOSE 190* 162*  BUN 62* 71*  CREATININE 1.69* 1.77*  CALCIUM 8.5 8.3*   PT/INR No results for input(s): LABPROT, INR in the last 72 hours. ABG No results for input(s): PHART, HCO3 in the last 72 hours.  Invalid input(s): PCO2, PO2  Studies/Results: No results found.   I have reviewed his labs and medical history.  A 55fr foley was placed after a betadine prep and urethral lubrication.  There was some resistance at the level of the sphincter but the catheter passed with only moderate effort.  The balloon was filled with 70ml and he drained several hundred ml of urine.   Assessment: Urinary retention. Plan: I would leave the foley until not needed for medical management.  Once his CHF is controlled, addition of tamsulosin might be worth considering. Reconsult prn.   CC: Dr. Shanon Brow Tat    LOS: 4 days    Malka So 07/14/2014

## 2014-07-14 NOTE — Progress Notes (Signed)
Patient is sleeping peacefully. Maintained on telemetry and is in NSR on the monitor. Bed alarm is in place secondary to patient's disorientation. Will continue to monitor.  Esperanza Heir, RN

## 2014-07-15 ENCOUNTER — Inpatient Hospital Stay (HOSPITAL_COMMUNITY): Payer: PRIVATE HEALTH INSURANCE

## 2014-07-15 LAB — GLUCOSE, CAPILLARY
GLUCOSE-CAPILLARY: 137 mg/dL — AB (ref 70–99)
GLUCOSE-CAPILLARY: 190 mg/dL — AB (ref 70–99)
Glucose-Capillary: 235 mg/dL — ABNORMAL HIGH (ref 70–99)
Glucose-Capillary: 167 mg/dL — ABNORMAL HIGH (ref 70–99)
Glucose-Capillary: 273 mg/dL — ABNORMAL HIGH (ref 70–99)

## 2014-07-15 LAB — BASIC METABOLIC PANEL
Anion gap: 11 (ref 5–15)
BUN: 78 mg/dL — AB (ref 6–23)
CHLORIDE: 98 meq/L (ref 96–112)
CO2: 35 mEq/L — ABNORMAL HIGH (ref 19–32)
Calcium: 8.4 mg/dL (ref 8.4–10.5)
Creatinine, Ser: 1.8 mg/dL — ABNORMAL HIGH (ref 0.50–1.35)
GFR calc non Af Amer: 34 mL/min — ABNORMAL LOW (ref >90)
GFR, EST AFRICAN AMERICAN: 39 mL/min — AB (ref >90)
GLUCOSE: 141 mg/dL — AB (ref 70–99)
Potassium: 4.1 mEq/L (ref 3.7–5.3)
SODIUM: 144 meq/L (ref 137–147)

## 2014-07-15 LAB — URINALYSIS, ROUTINE W REFLEX MICROSCOPIC
Bilirubin Urine: NEGATIVE
GLUCOSE, UA: NEGATIVE mg/dL
Ketones, ur: NEGATIVE mg/dL
Nitrite: NEGATIVE
PH: 5.5 (ref 5.0–8.0)
PROTEIN: NEGATIVE mg/dL
Specific Gravity, Urine: 1.01 (ref 1.005–1.030)
Urobilinogen, UA: 1 mg/dL (ref 0.0–1.0)

## 2014-07-15 LAB — URINE MICROSCOPIC-ADD ON

## 2014-07-15 LAB — PRO B NATRIURETIC PEPTIDE: Pro B Natriuretic peptide (BNP): 10438 pg/mL — ABNORMAL HIGH (ref 0–450)

## 2014-07-15 MED ORDER — FUROSEMIDE 10 MG/ML IJ SOLN
120.0000 mg | Freq: Three times a day (TID) | INTRAVENOUS | Status: DC
  Administered 2014-07-16 – 2014-07-18 (×6): 120 mg via INTRAVENOUS
  Filled 2014-07-15 (×10): qty 12

## 2014-07-15 MED ORDER — PREDNISONE 50 MG PO TABS
60.0000 mg | ORAL_TABLET | Freq: Every day | ORAL | Status: DC
  Administered 2014-07-16 – 2014-07-17 (×2): 60 mg via ORAL
  Filled 2014-07-15 (×3): qty 1

## 2014-07-15 MED ORDER — METOLAZONE 2.5 MG PO TABS
2.5000 mg | ORAL_TABLET | Freq: Two times a day (BID) | ORAL | Status: DC
  Administered 2014-07-15 – 2014-07-17 (×4): 2.5 mg via ORAL
  Filled 2014-07-15 (×5): qty 1

## 2014-07-15 MED ORDER — FUROSEMIDE 10 MG/ML IJ SOLN
40.0000 mg | Freq: Once | INTRAMUSCULAR | Status: AC
  Administered 2014-07-15: 40 mg via INTRAVENOUS
  Filled 2014-07-15: qty 4

## 2014-07-15 NOTE — Progress Notes (Signed)
Physical Therapy Treatment Patient Details Name: [name] [name] MRN: [id number] DOB: 10/12/1930 Today's Date: 07/15/2014    History of Present Illness 80 year old male with a history of systolic and diastolic CHF, COPD, CKD stage III, diabetes mellitus type 2, dementia presents from [facility] with shortness of breath 1 day.    PT Comments    Patient tolerated some ambulation today as well as self care tasks, required increased assist for mobility and hygiene. Difficulty sequencing through tasks such as getting out of bed. Physical assist required. Will continue to see and progress as tolerated.  OF NOTE: ambulated on  2 liters Sp02>90%  Follow Up Recommendations  SNF     Equipment Recommendations  None recommended by PT    Recommendations for Other Services       Precautions / Restrictions Precautions Precautions: Fall Restrictions Weight Bearing Restrictions: No    Mobility  Bed Mobility Overal bed mobility: Needs Assistance Bed Mobility: Rolling;Sidelying to Sit;Sit to Supine Rolling: Min assist Sidelying to sit: Mod assist   Sit to supine: Mod assist   General bed mobility comments: Assist for initiation of movement and positioning, assist to elevate trunk to upright, and LEs to bed upon return  Transfers Overall transfer level: Needs assistance Equipment used: Rolling walker (2 wheeled) Transfers: Sit to/from Stand Sit to Stand: Min assist;Mod assist (mod assist for transfer off toilet)         General transfer comment: cues for hand placement and assist to come forward.  Ambulation/Gait Ambulation/Gait assistance: Min assist Ambulation Distance (Feet): 70 Feet Assistive device: Rolling walker (2 wheeled) Gait Pattern/deviations: Step-through pattern;Shuffle;Wide base of support Gait velocity: decr Gait velocity interpretation: Below normal speed for age/gender General Gait Details: Assist for stability Max cues for positioning and safety with use  of RW, patient with LE buckling at times, more so in static positioing   Stairs            Wheelchair Mobility    Modified Rankin (Stroke Patients Only)       Balance     Sitting balance-Leahy Scale: Fair       Standing balance-Leahy Scale: Poor                      Cognition Arousal/Alertness: Awake/alert Behavior During Therapy: WFL for tasks assessed/performed Overall Cognitive Status: Impaired/Different from baseline Area of Impairment: Attention;Following commands;Safety/judgement;Problem solving   Current Attention Level: Focused   Following Commands: Follows one step commands consistently;Follows multi-step commands inconsistently Safety/Judgement: Decreased awareness of safety   Problem Solving: Slow processing;Requires verbal cues;Requires tactile cues      Exercises      General Comments        Pertinent Vitals/Pain Pain Assessment: No/denies pain    Home Living                      Prior Function            PT Goals (current goals can now be found in the care plan section) Acute Rehab PT Goals Patient Stated Goal: Not stated but agreeable to work on incr mobility PT Goal Formulation: With patient Time For Goal Achievement: 07/20/14 Potential to Achieve Goals: Good Progress towards PT goals: Progressing toward goals    Frequency  Min 2X/week    PT Plan Current plan remains appropriate    Co-evaluation             End of Session Equipment Utilized During Treatment:  Oxygen Activity Tolerance: Patient tolerated treatment well Patient left: in chair;with call bell/phone within reach;with nursing/sitter in room     Time: 4599-7741 PT Time Calculation (min) (ACUTE ONLY): 26 min  Charges:  $Gait Training: 8-22 mins $Therapeutic Activity: 8-22 mins                    G CodesAlassane, [name] 07-22-2014, 4:29 PM [name] [name], [place] DPT  [phone]

## 2014-07-15 NOTE — Progress Notes (Signed)
UR completed Ramie Erman K. Usiel Astarita, RN, BSN, [place], CCM  07/15/2014 4:57 PM

## 2014-07-15 NOTE — Progress Notes (Addendum)
Noted amber urine in foley bag.  Dr. Carles Collet made aware and instructed to send a UA.  Will continue to monitor.  Nellie Buck,RN.Marland Kitchen   UA shows many HGB and RBCs in urine

## 2014-07-15 NOTE — Progress Notes (Addendum)
PROGRESS NOTE  [name] [name] JME:[id number] DOB: Sep 23, 1930 DOA: 07/10/2014 PCP: [name], NP  80 year old male with a history of systolic and diastolic CHF, COPD, CKD stage III, diabetes mellitus type 2, dementia presents from [place] care with shortness of breath 1 day. Apparently, the nursing facility wanted to send the patient to the emergency department on the morning of 07/10/2014 because of shortness of breath. The patient's family took the patient home for Thanksgiving lunch. After he returned, the patient was even more short of breath. He presented to the emergency department where he was noted to have oxygen saturation of 90% on room air. Chest x-ray showed right pleural effusion. ProBNP was 8042. The patient was started on intravenous furosemide. The patient was recently discharged from the hospital on 06/24/2014 with CHF exacerbation.  Assessment/Plan: Acute on Chronic systolic and diastolic CHF -Daily weights--??accuracy, but bicarb going up indicating he is diuresing -He remains clinically volume overloaded -Accurate I's and O's--now more accurate since foley cath placed 07/14/14--neg 3L last 24hrs -03/07/2014 echocardiogram EF 40-45 percent, grade 1 diastolic dysfunction -22/97/9892 Lexiscan--negative for any reversible ischemia, EF 42% -12/20/2013 thoracocentesis of pleural effusion revealed transudate  -Continue carvedilol, BiDil  -PT evaluation-->SNF -07/15/14 CXR--worsen pulm edema and R-pleural effusion--no resp distress--oxygen sat remains 96-100% on 2L -07/15/14-increase lasix to 120mg  IV every 8 hours (from 80mg  tid) -07/15/14--add metolazone 2.5mg  bid -may need cardiology eval if no improvement COPD exacerbation -Wheezing is improved -Continue intravenous IV Solumedrol-->start to wean -d/c IV solumedrol -wean to po prednisone 07/16/14 -Supplemental oxygen -Pulmonary hygiene Urinary Retention -RN unsuccessful with multiple attempts to  place foley -Foley catheter placed by urology, Dr. Jeffie Pollock on 07/14/14 Chronic right lower extremity wounds -Appears to be venous stasis ulcerations -Not infected on physical exam -Wound care consultation CKD stage III -Baseline creatinine 1.4-1.8 -Moderate renal function on diuresis Diabetes mellitus type 2  -Keep the patient on NovoLog sliding scale while he is on intravenous steroids  -CBGs normally well controlled without medications  -As the patient is on intravenous steroids, start Levemir 5 units daily, but do not anticipate that the patient will need this long-term as he was well controlled without any agents prior to hospitalization. -07/15/14--d/c lantus as CBGs trending down and today is last day of IV steroids-->continue ISS -A1c= 5.7 Dementia  -Continue rivastigmine patch  HTN -controlled -continue home regimen Hyperlipidemia -cotninue statin and fenofibrate Lower extremity stasis ulcers -Do not appear infected clinically -Continue current dressings without Bactroban  Family Communication: Pt at beside Disposition Plan: SNF         Procedures/Studies: Dg Chest Port 1 View  07/15/2014   CLINICAL DATA:  Shortness of breath.  CHF.  EXAM: PORTABLE CHEST - 1 VIEW  COMPARISON:  07/10/2014, 07/01/2014, 12/31/2013.  FINDINGS: Cardiomegaly. Pulmonary vascular prominence noted on today's exam. Progressive right-sided pleural effusion noted. Increased density of the upper medial right chest may represent acute remain pleural fluid medially, this is progressed from prior exam. Atelectasis right lower lobe. Pulmonary interstitial prominence. Congestive heart failure is most likely present. No pneumothorax.  IMPRESSION: 1. Progressive right pleural effusion. 2. Cardiomegaly. Pulmonary vascular prominence noted on today's exam. Progressive interstitial prominence noted. These findings are consistent with congestive heart failure. 3. Right lower lobe atelectasis.    Electronically Signed   By: [place]   On: 07/15/2014 07:13   Dg Chest Port 1 View  07/10/2014   CLINICAL DATA:  Dyspnea  EXAM: PORTABLE CHEST -  1 VIEW  COMPARISON:  June 21, 2014  FINDINGS: There is a sizable pleural effusion on the right with consolidation involving the right middle and lower lobes. Left lung is clear. Heart is enlarged with pulmonary vascularity within normal limits. No adenopathy appreciable.  IMPRESSION: Persistent right effusion with consolidation involving essentially all of the right middle and lower lobes. Left lung clear. No change in cardiac silhouette.   Electronically Signed   By: Lowella Grip M.D.   On: 07/10/2014 21:25   Dg Chest Portable 1 View  06/21/2014   CLINICAL DATA:  Shortness of breath.  Bilateral leg swelling.  EXAM: PORTABLE CHEST - 1 VIEW  COMPARISON:  04/05/2014  FINDINGS: Shallow inspiration. Cardiac enlargement with pulmonary vascular congestion and perihilar edema. Moderate size right pleural effusion with basilar atelectasis. Changes are similar to those seen on previous study. No pneumothorax. Calcification of aorta.  IMPRESSION: Cardiac enlargement with pulmonary vascular congestion and perihilar edema. Right pleural effusion with basilar atelectasis.   Electronically Signed   By: Lucienne Capers M.D.   On: 06/21/2014 03:06         Subjective: Patient feels that he is breathing better. Denies any fevers, chills, chest pain, as above, nausea, vomiting, diarrhea, abdominal pain. No dysuria or hematuria. He is eating well.  Objective: Filed Vitals:   07/15/14 0954 07/15/14 1428 07/15/14 1436 07/15/14 1707  BP: 115/63 129/68  144/81  Pulse: 61 60  82  Temp:  97.5 F (36.4 C)    TempSrc:  Oral    Resp:  18    Height:      Weight:      SpO2:  100% 96%     Intake/Output Summary (Last 24 hours) at 07/15/14 1755 Last data filed at 07/15/14 1315  Gross per 24 hour  Intake   2140 ml  Output   4300 ml  Net  -2160 ml    Weight change:  Exam:   General:  Pt is alert, follows commands appropriately, not in acute distress  HEENT: No icterus, No thrush, [place]/AT  Cardiovascular: RRR, S1/S2, no rubs, no gallops  Respiratory: Diminished breath sounds right base. Left basilar crackles  Abdomen: Soft/+BS, non tender, non distended, no guarding  Extremities: 2+ edema, No lymphangitis, No petechiae, No rashes, no synovitis  Data Reviewed: Basic Metabolic Panel:  Recent Labs Lab 07/11/14 0333 07/12/14 0254 07/13/14 0319 07/14/14 0444 07/15/14 0333  NA 144 139 141 141 144  K 4.0 4.1 4.1 4.2 4.1  CL 102 98 100 99 98  CO2 29 29 30  33* 35*  GLUCOSE 208* 174* 190* 162* 141*  BUN 53* 56* 62* 71* 78*  CREATININE 1.81* 1.66* 1.69* 1.77* 1.80*  CALCIUM 8.7 8.5 8.5 8.3* 8.4  MG  --  2.3  --   --   --    Liver Function Tests: No results for input(s): AST, ALT, ALKPHOS, BILITOT, PROT, ALBUMIN in the last 168 hours. No results for input(s): LIPASE, AMYLASE in the last 168 hours. No results for input(s): AMMONIA in the last 168 hours. CBC:  Recent Labs Lab 07/10/14 2005  WBC 4.7  NEUTROABS 2.8  HGB 12.0*  HCT 36.5*  MCV 98.9  PLT 168   Cardiac Enzymes: No results for input(s): CKTOTAL, CKMB, CKMBINDEX, TROPONINI in the last 168 hours. BNP: Invalid input(s): POCBNP CBG:  Recent Labs Lab 07/14/14 1551 07/14/14 2111 07/15/14 0601 07/15/14 1054 07/15/14 1606  GLUCAP 298* 235* 137* 167* 190*    No results found for  this or any previous visit (from the past 240 hour(s)).   Scheduled Meds: . albuterol  2.5 mg Nebulization TID  . aspirin  324 mg Oral Once  . aspirin EC  81 mg Oral QPM  . atorvastatin  20 mg Oral QPM  . carvedilol  6.25 mg Oral BID WC  . feeding supplement (PRO-STAT SUGAR FREE 64)  30 mL Oral TID WC  . fenofibrate  160 mg Oral QPM  . [START ON 07/16/2014] furosemide  120 mg Intravenous 3 times per day  . gabapentin  200 mg Oral BID  . heparin  5,000 Units Subcutaneous 3  times per day  . insulin aspart  0-15 Units Subcutaneous TID WC  . insulin glargine  5 Units Subcutaneous QHS  . isosorbide-hydrALAZINE  1 tablet Oral TID  . latanoprost  1 drop Left Eye QHS  . methylPREDNISolone (SOLU-MEDROL) injection  60 mg Intravenous Q12H  . metolazone  2.5 mg Oral BID  . mupirocin ointment  1 application Nasal BID  . [START ON 07/16/2014] predniSONE  60 mg Oral Q breakfast  . rivastigmine  4.6 mg Transdermal Daily  . traZODone  50 mg Oral QHS   Continuous Infusions:    Kahlea Cobert, DO  [facility] Pager [phone]  If 7PM-7AM, please contact night-coverage [email or web address] Password [email or web address] 07/15/2014, 5:55 PM   LOS: 5 days

## 2014-07-15 NOTE — Progress Notes (Signed)
Noted new order for lasix 40mg  iv.  Also zaroxolyn 2.5mg  which  not currently on floor.  Informed Dr. Carles Collet and he instructed ok to give at 1900.  Kingslee Dowse, Therapist, sports.

## 2014-07-16 ENCOUNTER — Inpatient Hospital Stay (HOSPITAL_COMMUNITY): Payer: PRIVATE HEALTH INSURANCE

## 2014-07-16 DIAGNOSIS — I10 Essential (primary) hypertension: Secondary | ICD-10-CM

## 2014-07-16 DIAGNOSIS — I5033 Acute on chronic diastolic (congestive) heart failure: Secondary | ICD-10-CM

## 2014-07-16 LAB — BASIC METABOLIC PANEL
Anion gap: 10 (ref 5–15)
BUN: 85 mg/dL — ABNORMAL HIGH (ref 6–23)
CO2: 35 mEq/L — ABNORMAL HIGH (ref 19–32)
CREATININE: 1.78 mg/dL — AB (ref 0.50–1.35)
Calcium: 8.3 mg/dL — ABNORMAL LOW (ref 8.4–10.5)
Chloride: 95 mEq/L — ABNORMAL LOW (ref 96–112)
GFR calc non Af Amer: 34 mL/min — ABNORMAL LOW (ref >90)
GFR, EST AFRICAN AMERICAN: 39 mL/min — AB (ref >90)
GLUCOSE: 239 mg/dL — AB (ref 70–99)
Potassium: 4.2 mEq/L (ref 3.7–5.3)
Sodium: 140 mEq/L (ref 137–147)

## 2014-07-16 LAB — GLUCOSE, CAPILLARY
GLUCOSE-CAPILLARY: 201 mg/dL — AB (ref 70–99)
GLUCOSE-CAPILLARY: 192 mg/dL — AB (ref 70–99)
Glucose-Capillary: 248 mg/dL — ABNORMAL HIGH (ref 70–99)
Glucose-Capillary: 184 mg/dL — ABNORMAL HIGH (ref 70–99)

## 2014-07-16 MED ORDER — BUDESONIDE 0.25 MG/2ML IN SUSP
0.2500 mg | Freq: Two times a day (BID) | RESPIRATORY_TRACT | Status: DC
  Administered 2014-07-16 – 2014-07-22 (×9): 0.25 mg via RESPIRATORY_TRACT
  Filled 2014-07-16 (×17): qty 2

## 2014-07-16 MED ORDER — PREDNISONE 20 MG PO TABS: 40.0000 mg | ORAL_TABLET | Freq: Every day | ORAL | Status: DC

## 2014-07-16 MED ORDER — TOBRAMYCIN 0.3 % OP SOLN
2.0000 [drp] | OPHTHALMIC | Status: DC
  Administered 2014-07-16 – 2014-07-23 (×34): 2 [drp] via OPHTHALMIC
  Filled 2014-07-16: qty 5

## 2014-07-16 MED ORDER — LIDOCAINE HCL (PF) 1 % IJ SOLN
INTRAMUSCULAR | Status: AC
  Filled 2014-07-16: qty 10

## 2014-07-16 MED ORDER — INSULIN ASPART 100 UNIT/ML ~~LOC~~ SOLN
3.0000 [IU] | Freq: Three times a day (TID) | SUBCUTANEOUS | Status: DC
  Administered 2014-07-16 – 2014-07-18 (×4): 3 [IU] via SUBCUTANEOUS

## 2014-07-16 MED ORDER — TOBRAMYCIN 0.3 % OP SOLN: 2.0000 [drp] | OPHTHALMIC | Status: DC

## 2014-07-16 NOTE — Progress Notes (Signed)
Pt. A/Ox2. He had no c/o pain during the shift and no signs of distress. Urinary catheter in place with large amounts of urinary output. Pt.appeared to have some moderate yellow-green drainage from his left eye with some sclera and conjunctiva redness. Attending MD called and notified. New order written. Pt.had orders for thoracentesis ultrasound attempted to contact family for consent but phone numbers not in service.

## 2014-07-16 NOTE — Progress Notes (Signed)
Inpatient Diabetes Program Recommendations  AACE/ADA: New Consensus Statement on Inpatient Glycemic Control (2013)  Target Ranges:  Prepandial:   less than 140 mg/dL      Peak postprandial:   less than 180 mg/dL (1-2 hours)      Critically ill patients:  140 - 180 mg/dL   Results for AVELINO, HERREN (MRN [id number]) as of 07/16/2014 09:57  Ref. Range 07/15/2014 06:01 07/15/2014 10:54 07/15/2014 16:06 07/15/2014 21:15 07/16/2014 06:05  Glucose-Capillary Latest Range: 70-99 mg/dL 137 (H) 167 (H) 190 (H) 273 (H) 201 (H)   Diabetes history: DM2 Outpatient Diabetes medications: None Current orders for Inpatient glycemic control: Novolog 0-15 units TID with meals  Inpatient Diabetes Program Recommendations Insulin - Meal Coverage: While inpatient and if steroids will be continued, please consider ordering Novolog 3 units TID with meals for meal coverage.  Thanks, Barnie Alderman, RN, MSN, CCRN, CDE Diabetes Coordinator Inpatient Diabetes Program [phone] (Team Pager) [phone] ([facility] office) [phone] [facility] office)

## 2014-07-16 NOTE — Progress Notes (Addendum)
Patient ID: [name] [name]  male  WRU:[id number]    DOB: 1931-02-13    DOA: 07/10/2014  PCP: [name], NP  Brief history of present illness Patient is a 80 year old male with systolic and diastolic CHF, COPD, CAD stage III, diabetes type 2, dementia presented from skilled nursing facility with shortness of breath for at least one day prior to admission. Apparently, patient's family had taken the patient home for Thanksgiving lunch, after he returned he was more short of breath. Patient was found to have O2 sats of 90% on room air term, chest x-ray showed right pleural effusion, BNP 8042, patient was started on IV diuresis. He was recently discharged from the hospital on 06/24/14 with CHF exacerbation.  Assessment/Plan: Principal Problem:   Acute on chronic respiratory failure likely due to acute on chronic systolic and diastolic CHF exacerbation, possibly worsened due to dietary indiscretion and COPD exacerbation - Recently discharged on 06/24/14 with CHF exacerbation - 2-D echo on 03/07/14 had shown EF 91-47%, grade 1 diastolic dysfunction. Nuclear medicine stress test on 7/26 showed no reversible ischemia, EF 42% - Patient had chest x-ray repeated on 12/1 showed worsening pulmonary edema - Lasix was increased on 12/1 with metolazone, will consult cardiology for further recommendations  Active Problems: COPD exacerbation - IV Solu-Medrol tapered, transitioned to oral prednisone from today, continue bronchodilators, O2  Right-sided pleural effusion - Pulmonary consulted for thoracentesis, will need diagnostic and therapeutic tap    CKD (chronic kidney disease), stage III - Cr 1.7, stable  Urinary retention: With hematuria, ? Traumatic - foley cath placed by urology, Dr Jeffie Pollock 11/30.  - DC'd heparin subcutaneous, placed on SCDs - Placed on bladder irrigation   Diabetes mellitus - Add meal coverage, continue sliding scale insulin - Hemoglobin A1c 5.7  Dementia Continue  reverse segment patch  Hyperlipidemia -  continue statin and fenofibrate  Hypertension Currently stable  Bilateral lower extremities stasis ulcers - Continue dressing changes   DVT Prophylaxis:  Code Status:  Family Communication:  Disposition:  Consultants:  Cardiology called today  Urology  Procedures:  Foley catheter placement  Antibiotics:  none    Subjective: Patient seen and examined, feels he is breathing better, no chest pain, nausea vomiting or diarrhea, abdominal pain  Objective: Weight change:   Intake/Output Summary (Last 24 hours) at 07/16/14 1059 Last data filed at 07/16/14 0905  Gross per 24 hour  Intake   1350 ml  Output   4950 ml  Net  -3600 ml   Blood pressure 154/63, pulse 65, temperature 98 F (36.7 C), temperature source Tympanic, resp. rate 18, height 5\' 7"  (1.702 m), weight 102.967 kg (227 lb), SpO2 97 %.  Physical Exam: General: Alert and awake, oriented x3, not in any acute distress. CVS: S1-S2 clear, no murmur rubs or gallops Chest: Decreased breath sounds at the bases Abdomen: soft nontender, nondistended, normal bowel sounds  Extremities: no cyanosis, clubbing, 2+ edema noted bilaterally Neuro: Cranial nerves II-XII intact, no focal neurological deficits  Lab Results: Basic Metabolic Panel:  Recent Labs Lab 07/12/14 0254  07/15/14 0333 07/16/14 0610  NA 139  < > 144 140  K 4.1  < > 4.1 4.2  CL 98  < > 98 95*  CO2 29  < > 35* 35*  GLUCOSE 174*  < > 141* 239*  BUN 56*  < > 78* 85*  CREATININE 1.66*  < > 1.80* 1.78*  CALCIUM 8.5  < > 8.4 8.3*  MG 2.3  --   --   --   < > =  values in this interval not displayed. Liver Function Tests: No results for input(s): AST, ALT, ALKPHOS, BILITOT, PROT, ALBUMIN in the last 168 hours. No results for input(s): LIPASE, AMYLASE in the last 168 hours. No results for input(s): AMMONIA in the last 168 hours. CBC:  Recent Labs Lab 07/10/14 2005  WBC 4.7  NEUTROABS 2.8  HGB  12.0*  HCT 36.5*  MCV 98.9  PLT 168   Cardiac Enzymes: No results for input(s): CKTOTAL, CKMB, CKMBINDEX, TROPONINI in the last 168 hours. BNP: Invalid input(s): POCBNP CBG:  Recent Labs Lab 07/15/14 0601 07/15/14 1054 07/15/14 1606 07/15/14 2115 07/16/14 0605  GLUCAP 137* 167* 190* 273* 201*     Micro Results: No results found for this or any previous visit (from the past 240 hour(s)).  Studies/Results: Dg Chest Port 1 View  07/15/2014   CLINICAL DATA:  Shortness of breath.  CHF.  EXAM: PORTABLE CHEST - 1 VIEW  COMPARISON:  07/10/2014, 07/01/2014, 12/31/2013.  FINDINGS: Cardiomegaly. Pulmonary vascular prominence noted on today's exam. Progressive right-sided pleural effusion noted. Increased density of the upper medial right chest may represent acute remain pleural fluid medially, this is progressed from prior exam. Atelectasis right lower lobe. Pulmonary interstitial prominence. Congestive heart failure is most likely present. No pneumothorax.  IMPRESSION: 1. Progressive right pleural effusion. 2. Cardiomegaly. Pulmonary vascular prominence noted on today's exam. Progressive interstitial prominence noted. These findings are consistent with congestive heart failure. 3. Right lower lobe atelectasis.   Electronically Signed   By: Marcello Moores  Register   On: 07/15/2014 07:13   Dg Chest Port 1 View  07/10/2014   CLINICAL DATA:  Dyspnea  EXAM: PORTABLE CHEST - 1 VIEW  COMPARISON:  June 21, 2014  FINDINGS: There is a sizable pleural effusion on the right with consolidation involving the right middle and lower lobes. Left lung is clear. Heart is enlarged with pulmonary vascularity within normal limits. No adenopathy appreciable.  IMPRESSION: Persistent right effusion with consolidation involving essentially all of the right middle and lower lobes. Left lung clear. No change in cardiac silhouette.   Electronically Signed   By: Lowella Grip M.D.   On: 07/10/2014 21:25   Dg Chest Portable  1 View  06/21/2014   CLINICAL DATA:  Shortness of breath.  Bilateral leg swelling.  EXAM: PORTABLE CHEST - 1 VIEW  COMPARISON:  04/05/2014  FINDINGS: Shallow inspiration. Cardiac enlargement with pulmonary vascular congestion and perihilar edema. Moderate size right pleural effusion with basilar atelectasis. Changes are similar to those seen on previous study. No pneumothorax. Calcification of aorta.  IMPRESSION: Cardiac enlargement with pulmonary vascular congestion and perihilar edema. Right pleural effusion with basilar atelectasis.   Electronically Signed   By: Lucienne Capers M.D.   On: 06/21/2014 03:06    Medications: Scheduled Meds: . albuterol  2.5 mg Nebulization TID  . aspirin  324 mg Oral Once  . aspirin EC  81 mg Oral QPM  . atorvastatin  20 mg Oral QPM  . carvedilol  6.25 mg Oral BID WC  . feeding supplement (PRO-STAT SUGAR FREE 64)  30 mL Oral TID WC  . fenofibrate  160 mg Oral QPM  . furosemide  120 mg Intravenous 3 times per day  . gabapentin  200 mg Oral BID  . insulin aspart  0-15 Units Subcutaneous TID WC  . isosorbide-hydrALAZINE  1 tablet Oral TID  . latanoprost  1 drop Left Eye QHS  . metolazone  2.5 mg Oral BID  . mupirocin ointment  1 application Nasal BID  . predniSONE  60 mg Oral Q breakfast  . rivastigmine  4.6 mg Transdermal Daily  . traZODone  50 mg Oral QHS      LOS: 6 days   RAI,RIPUDEEP M.D. [facility] 07/16/2014, 10:59 AM Pager: [phone]  If 7PM-7AM, please contact night-coverage [email or web address] Password [email or web address]

## 2014-07-16 NOTE — Consult Note (Signed)
Name: [name] [name] MRN: [id number] DOB: Dec 24, 1930    ADMISSION DATE:  07/10/2014 CONSULTATION DATE:  08/13/2014  REFERRING MD :  Dr. Tana Coast  CHIEF COMPLAINT:  SOB  BRIEF PATIENT DESCRIPTION: 80 year old male who presented to ED 11/26 c/o SOB. He was admitted for COPD/CHF exacerbation. He was treated with the usual modalities, however his effusion continued to increase in size. 12/2 PCCM was consulted for potential thoracentesis.   SIGNIFICANT EVENTS  12/2 Thora >   STUDIES:  03/07/2014 echocardiogram EF 40-45 percent, grade 1 diastolic dysfunction  HISTORY OF PRESENT ILLNESS:  80 year old male with PMH of COPD, CHF, CKD stage 3, CAD, and dementia. He presented to [facility] ED complaining of SOB 11/26. He was admitted to the hospitalist team for AECOPD and CHF exacerbation. BNP was elevated. COPD had been improving and he was diuresing well, however his effusion continued to enlarge. Cardiology was consulted and recommened aggressive diureses. PCCM was consulted for potential thoracentesis for therapeutic and diagnostic purposes.   PAST MEDICAL HISTORY :   has a past medical history of Hypertension; Diabetes mellitus; Arthritis; Chronic diastolic CHF (congestive heart failure); COPD (chronic obstructive pulmonary disease); Hyperlipidemia; CKD (chronic kidney disease), stage III; Inferior MI; Dementia; PAD (peripheral artery disease); Mild mitral regurgitation; Mild AI (aortic insufficiency); Lung disease; and Human metapneumovirus pneumonia.  has past surgical history that includes Leg Surgery; Tonsillectomy; Appendectomy; and Cataract extraction w/PHACO (Left, 09/04/2013). Prior to Admission medications   Medication Sig Start Date End Date Taking? Authorizing Provider  acetaminophen (TYLENOL) 500 MG tablet Take 500 mg by mouth every 6 (six) hours as needed for moderate pain.    Yes Historical Provider, MD  albuterol (PROVENTIL HFA;VENTOLIN HFA) 108 (90 BASE) MCG/ACT inhaler Inhale 2 puffs into  the lungs every 4 (four) hours as needed for wheezing or shortness of breath.    Yes Historical Provider, MD  albuterol (PROVENTIL) (2.5 MG/3ML) 0.083% nebulizer solution Take 2.5 mg by nebulization 3 (three) times daily.    Yes Historical Provider, MD  Amino Acids-Protein Hydrolys (FEEDING SUPPLEMENT, PRO-STAT SUGAR FREE 64,) LIQD Take 30 mLs by mouth 3 (three) times daily with meals.   Yes Historical Provider, MD  aspirin EC 81 MG tablet Take 81 mg by mouth every evening.    Yes Historical Provider, MD  atorvastatin (LIPITOR) 20 MG tablet Take 1 tablet (20 mg total) by mouth every evening. 06/24/14  Yes Maryann Mikhail, DO  carvedilol (COREG) 6.25 MG tablet Take 1 tablet (6.25 mg total) by mouth 2 (two) times daily with a meal. 03/12/14  Yes Dayna N Dunn, PA-C  cholecalciferol (VITAMIN D) 1000 UNITS tablet Take 2,000 Units by mouth daily.    Yes Historical Provider, MD  fenofibrate 160 MG tablet Take 160 mg by mouth every evening.    Yes Historical Provider, MD  gabapentin (NEURONTIN) 100 MG capsule Take 200 mg by mouth 2 (two) times daily.   Yes Historical Provider, MD  guaiFENesin (MUCINEX) 600 MG 12 hr tablet Take 600 mg by mouth every 12 (twelve) hours.   Yes Historical Provider, MD  isosorbide-hydrALAZINE (BIDIL) 20-37.5 MG per tablet Take 1 tablet by mouth 3 (three) times daily. 03/12/14  Yes Dayna N Dunn, PA-C  rivastigmine (EXELON) 4.6 mg/24hr Place 4.6 mg onto the skin daily.   Yes Historical Provider, MD  torsemide (DEMADEX) 20 MG tablet Take 40 mg by mouth 2 (two) times daily.    Yes Historical Provider, MD  TRAVATAN Z 0.004 % SOLN ophthalmic solution  Place 1 drop into the left eye at bedtime.  07/04/13  Yes Historical Provider, MD  traZODone (DESYREL) 50 MG tablet Take 50 mg by mouth at bedtime.   Yes Historical Provider, MD   No Known Allergies  FAMILY HISTORY:  family history includes Diabetes in his father and mother. SOCIAL HISTORY:  reports that he quit smoking about 5 years  ago. His smoking use included Cigarettes. He has a 35 pack-year smoking history. He has never used smokeless tobacco. He reports that he does not drink alcohol or use illicit drugs.  REVIEW OF SYSTEMS:    Bolds are positive  Constitutional: weight loss, gain, night sweats, Fevers, chills, fatigue .  HEENT: headaches, Sore throat, sneezing, nasal congestion, post nasal drip, Difficulty swallowing, Tooth/dental problems, visual complaints visual changes, ear ache CV:  chest pain, radiates: ,Orthopnea, PND, swelling in lower extremities, dizziness, palpitations, syncope.  GI  heartburn, indigestion, abdominal pain, nausea, vomiting, diarrhea, change in bowel habits, loss of appetite, bloody stools.  Resp: cough, productive: , hemoptysis, dyspnea, chest pain, pleuritic.  Skin: rash or itching or icterus GU: dysuria, change in color of urine, urgency or frequency. flank pain, hematuria  MS: joint pain or swelling. decreased range of motion. Psych: change in mood or affect. depression or anxiety.  Neuro: difficulty with speech, weakness, numbness, ataxia    SUBJECTIVE:   VITAL SIGNS: Temp:  [97.7 F (36.5 C)-98.3 F (36.8 C)] 97.7 F (36.5 C) (12/02 1355) Pulse Rate:  [65-82] 65 (12/02 1355) Resp:  [17-18] 18 (12/02 0601) BP: (144-155)/(51-81) 155/51 mmHg (12/02 1355) SpO2:  [90 %-100 %] 98 % (12/02 1342) Weight:  [102.967 kg (227 lb)-106 kg (233 lb 11 oz)] 102.967 kg (227 lb) (12/02 0601)  PHYSICAL EXAMINATION: General:  Overweight male in NAD Neuro:  Spontaneously awake, alert, disoriented.  HEENT:  [place]/AT, Poor dentition, PERRL, No JVD noted.  Cardiovascular:  RRR, no MRG Lungs:  Respitations even, unlabored. No wheeze. Diminished R breath sounds. Abdomen:  Soft, non-tender, non-distended Musculoskeletal:  No acute deformity Skin:  Intact   Recent Labs Lab 07/14/14 0444 07/15/14 0333 07/16/14 0610  NA 141 144 140  K 4.2 4.1 4.2  CL 99 98 95*  CO2 33* 35* 35*  BUN 71* 78*  85*  CREATININE 1.77* 1.80* 1.78*  GLUCOSE 162* 141* 239*    Recent Labs Lab 07/10/14 2005  HGB 12.0*  HCT 36.5*  WBC 4.7  PLT 168   Dg Chest Port 1 View  07/15/2014   CLINICAL DATA:  Shortness of breath.  CHF.  EXAM: PORTABLE CHEST - 1 VIEW  COMPARISON:  07/10/2014, 07/01/2014, 12/31/2013.  FINDINGS: Cardiomegaly. Pulmonary vascular prominence noted on today's exam. Progressive right-sided pleural effusion noted. Increased density of the upper medial right chest may represent acute remain pleural fluid medially, this is progressed from prior exam. Atelectasis right lower lobe. Pulmonary interstitial prominence. Congestive heart failure is most likely present. No pneumothorax.  IMPRESSION: 1. Progressive right pleural effusion. 2. Cardiomegaly. Pulmonary vascular prominence noted on today's exam. Progressive interstitial prominence noted. These findings are consistent with congestive heart failure. 3. Right lower lobe atelectasis.   Electronically Signed   By: Marcello Moores  Register   On: 07/15/2014 07:13    ASSESSMENT / PLAN:  Large R Pleural effusion transudate in past, last tap 12/2013 by PCCM Acute on chronic CHF AECOPD - Supplemental O2 to maintain SpO2 greater than 92% - Diagnostic/Therapeutic thoracentesis - Await pleural fluid analysis - Diurese per cardiology - Goal of negative fluid  balance - Continue scheduled and PRN albuterol - Add budesonide nebs BID - Continue prednisone taper as able -pcxr now   Georgann Housekeeper, ACNP [facility] Pager [phone] or [phone]  STAFF NOTE: Linwood Dibbles, MD FACP have personally reviewed patient's available data, including medical history, events of note, physical examination and test results as part of my evaluation. I have discussed with resident/NP and other care providers such as pharmacist, RN and RRT. In addition, I personally evaluated patient and elicited key findings of: will follow post thora  pcxr, continued neg balance lasix, was successful last 24 hrs, will re evaluate new fluid studies ( i hope sent by IR), in future pccm would gladly do thora in order to assess fluid and volume off required, cards evaluation noted, ensure serum prot, ldh  Lavon Paganini. Titus Mould, MD, [place] Pgr: [facility] 07/16/2014 9:00 PM

## 2014-07-16 NOTE — Consult Note (Signed)
Name: [name] [name] is a 80 y.o. male Admit date: 07/10/2014 Referring Physician:  Dr. Grafton Folk Primary Physician:  Cletis Athens, NP Primary Cardiologist:  Loralie Champagne, M.D.  Reason for Consultation:  Heart failure management  ASSESSMENT: 1. Massive right pleural effusion presumed secondary to heart failure. Rule out other causes. 2. Acute on chronic diastolic heart failure with most recent LVEF 40-45% 3. Chronic right heart dysfunction secondary to left heart failure and underlying pulmonary disease which produced moderate pulmonary hypertension 4. Dementia 5. COPD/interstitial lung disease 6. Chronic kidney disease, stage III, with worsening azotemia as diuresis is attempted.   PLAN:  1. Therapeutic and diagnostic right thoracentesis. Rule out conversion to an exudative process. This effusion is contributing to the patient's respiratory failure. Kidneys to be resolved this and is possible. 2. Aggressive diuresis 3. Depending upon findings from pleural fluid, may have to reconsult pulmonary 4. May not need metolazone. He is developing progressive azotemia with attempts at diuresis.   HPI: 80 year old gentleman with dementia who has no complaints. He lives in a nursing home. He has a very large right pleural effusion. This effusion is larger now than it was several weeks ago. Left lung is relatively clear. He denies chest pain.  PMH:   Past Medical History  Diagnosis Date  . Hypertension   . Diabetes mellitus     a. Diet-controlled.  . Arthritis   . Chronic diastolic CHF (congestive heart failure)     a. Primarily diastolic CHF & RV dysfunction but systolic dysfunction. 2D Echo 03/07/14 showed EF 40-45%, diffuse HK, grade 1 diastolic dysfunction, mild MR/AI, mod dilated LA/RA, mildly dilated RV, PA pressure 26mHg. Lexiscan nuc 02/2014: EF 43% with no ischemia, inferior hypokinesis with "inferior attenuation." Dr. Aundra Dubin suspected probable prior inferior MI.  Marland Kitchen COPD (chronic  obstructive pulmonary disease)   . Hyperlipidemia   . CKD (chronic kidney disease), stage III     renal insuficiency  . Inferior MI     a. Based on nuc results 02/2014, Dr. Aundra Dubin suspected probable prior inferior MI (EF 43% with no ischemia, inferior hypokinesis with "inferior attenuation").  . Dementia   . PAD (peripheral artery disease)     a. ABI 02/2014: R 0.45, L 0.56. R foot nonhealing ulcers.  . Mild mitral regurgitation   . Mild AI (aortic insufficiency)   . Lung disease     a. Possible interstitial lung disease: Followed by [name]. PFTs (1/15) with FVC 53%, FEV1 46%, ratio 85%, TLC 62%, DLCO 45% (restrictive defect).  . Human metapneumovirus pneumonia     a. Metapneumovirus PNA in 5/15 with acute respiratory failure and intubation. Bilateral transudative pleural effusions.    PSH:   Past Surgical History  Procedure Laterality Date  . Leg surgery      both legs - unsure of exact operations - from trauma  . Tonsillectomy    . Appendectomy    . Cataract extraction w/phaco Left 09/04/2013    Procedure: CATARACT EXTRACTION PHACO AND INTRAOCULAR LENS PLACEMENT (IOC);  Surgeon: Adonis Brook, MD;  Location: [facility];  Service: Ophthalmology;  Laterality: Left;   Allergies:  Review of patient's allergies indicates no known allergies. Prior to Admit Meds:   Prescriptions prior to admission  Medication Sig Dispense Refill Last Dose  . acetaminophen (TYLENOL) 500 MG tablet Take 500 mg by mouth every 6 (six) hours as needed for moderate pain.    unknown  . albuterol (PROVENTIL HFA;VENTOLIN HFA) 108 (90 BASE) MCG/ACT inhaler Inhale  2 puffs into the lungs every 4 (four) hours as needed for wheezing or shortness of breath.    07/09/2014 at Unknown time  . albuterol (PROVENTIL) (2.5 MG/3ML) 0.083% nebulizer solution Take 2.5 mg by nebulization 3 (three) times daily.    07/10/2014 at Unknown time  . Amino Acids-Protein Hydrolys (FEEDING SUPPLEMENT, PRO-STAT SUGAR FREE 64,) LIQD Take 30 mLs  by mouth 3 (three) times daily with meals.   07/10/2014 at Unknown time  . aspirin EC 81 MG tablet Take 81 mg by mouth every evening.    07/10/2014 at Unknown time  . atorvastatin (LIPITOR) 20 MG tablet Take 1 tablet (20 mg total) by mouth every evening.   07/10/2014 at Unknown time  . carvedilol (COREG) 6.25 MG tablet Take 1 tablet (6.25 mg total) by mouth 2 (two) times daily with a meal.   07/10/2014 at 1800  . cholecalciferol (VITAMIN D) 1000 UNITS tablet Take 2,000 Units by mouth daily.    07/10/2014 at Unknown time  . fenofibrate 160 MG tablet Take 160 mg by mouth every evening.    07/10/2014 at Unknown time  . gabapentin (NEURONTIN) 100 MG capsule Take 200 mg by mouth 2 (two) times daily.   07/10/2014 at Unknown time  . guaiFENesin (MUCINEX) 600 MG 12 hr tablet Take 600 mg by mouth every 12 (twelve) hours.   07/10/2014 at Unknown time  . isosorbide-hydrALAZINE (BIDIL) 20-37.5 MG per tablet Take 1 tablet by mouth 3 (three) times daily.   07/10/2014 at Unknown time  . rivastigmine (EXELON) 4.6 mg/24hr Place 4.6 mg onto the skin daily.   07/10/2014 at Unknown time  . torsemide (DEMADEX) 20 MG tablet Take 40 mg by mouth 2 (two) times daily.    07/10/2014 at Unknown time  . TRAVATAN Z 0.004 % SOLN ophthalmic solution Place 1 drop into the left eye at bedtime.    07/09/2014 at Unknown time  . traZODone (DESYREL) 50 MG tablet Take 50 mg by mouth at bedtime.   07/09/2014 at Unknown time   Fam HX:    Family History  Problem Relation Age of Onset  . Diabetes Father   . Diabetes Mother    Social HX:    History   Social History  . Marital Status: Single    Spouse Name: N/A    Number of Children: N/A  . Years of Education: N/A   Occupational History  . Not on file.   Social History Main Topics  . Smoking status: Former Smoker -- 0.50 packs/day for 70 years    Types: Cigarettes    Quit date: 05/14/2009  . Smokeless tobacco: Never Used  . Alcohol Use: No  . Drug Use: No     Comment:  quit marijuana 3-4 years ago  . Sexual Activity: No   Other Topics Concern  . Not on file   Social History Narrative     Review of Systems: The patient is awake and asked this question. He has no complaints. He denied dyspnea. He denies chest pain. He denies lower extremity swelling. He is not sure why he is in the hospital. Denies orthopnea. He has not had nausea or vomiting. He denies headache and difficulty with ambulation.  Physical Exam: Blood pressure 154/63, pulse 65, temperature 98 F (36.7 C), temperature source Tympanic, resp. rate 18, height 5\' 7"  (1.702 m), weight 227 lb (102.967 kg), SpO2 97 %. Weight change:    Pleasant African-American male who appears somewhat disoriented. He engages in conversation but without  substance. HEENT exam reveals no jaundice or pallor. He appears to stare without actually focusing. Neck exam reveals moderate JVD with the patient sitting at 90 at the bedside. No obvious carotid bruit is heard. Chest revealed absent breath sounds from the right base to near the apex of the lung. Left lung sounds are present with faint basilar rales noted. Cardiac exam reveals a 2/6 systolic murmur at the apex. Rate is regular. No diastolic murmurs heard. Abdomen is soft without tenderness. Liver is not palpable. Extremities are nontender but contain edema and is somewhat doughy. The neurological exam is difficult to perform. He follows some commands. She had difficulty hearing. He volunteers no spontaneous conversation. There is no gross motor or sensory deficit.  Labs: Lab Results  Component Value Date   WBC 4.7 07/10/2014   HGB 12.0* 07/10/2014   HCT 36.5* 07/10/2014   MCV 98.9 07/10/2014   PLT 168 07/10/2014    Recent Labs Lab 07/16/14 0610  NA 140  K 4.2  CL 95*  CO2 35*  BUN 85*  CREATININE 1.78*  CALCIUM 8.3*  GLUCOSE 239*   No results found for: PTT Lab Results  Component Value Date   INR 1.32 03/06/2014   Lab Results  Component  Value Date   CKTOTAL 261* 11/01/2013   CKMB 4.3 11/01/2013   TROPONINI <0.30 12/30/2013    No results found for: CHOL No results found for: HDL No results found for: [facility] Lab Results  Component Value Date   TRIG 78 12/29/2013   No results found for: CHOLHDL No results found for: LDLDIRECT    Radiology:  Dg Chest Port 1 View  07/15/2014   CLINICAL DATA:  Shortness of breath.  CHF.  EXAM: PORTABLE CHEST - 1 VIEW  COMPARISON:  07/10/2014, 07/01/2014, 12/31/2013.  FINDINGS: Cardiomegaly. Pulmonary vascular prominence noted on today's exam. Progressive right-sided pleural effusion noted. Increased density of the upper medial right chest may represent acute remain pleural fluid medially, this is progressed from prior exam. Atelectasis right lower lobe. Pulmonary interstitial prominence. Congestive heart failure is most likely present. No pneumothorax.  IMPRESSION: 1. Progressive right pleural effusion. 2. Cardiomegaly. Pulmonary vascular prominence noted on today's exam. Progressive interstitial prominence noted. These findings are consistent with congestive heart failure. 3. Right lower lobe atelectasis.   Electronically Signed   By: Marcello Moores  Register   On: 07/15/2014 07:13    EKG:  ECG from 07/12/2014 reveals normal sinus rhythm with nonspecific ST abnormality.   Sinclair Grooms 07/16/2014 11:15 AM

## 2014-07-17 DIAGNOSIS — I429 Cardiomyopathy, unspecified: Secondary | ICD-10-CM

## 2014-07-17 DIAGNOSIS — I509 Heart failure, unspecified: Secondary | ICD-10-CM

## 2014-07-17 LAB — GLUCOSE, CAPILLARY
GLUCOSE-CAPILLARY: 183 mg/dL — AB (ref 70–99)
GLUCOSE-CAPILLARY: 195 mg/dL — AB (ref 70–99)
Glucose-Capillary: 113 mg/dL — ABNORMAL HIGH (ref 70–99)
Glucose-Capillary: 177 mg/dL — ABNORMAL HIGH (ref 70–99)

## 2014-07-17 LAB — HEPATIC FUNCTION PANEL
ALT: 32 U/L (ref 0–53)
AST: 34 U/L (ref 0–37)
Albumin: 2.6 g/dL — ABNORMAL LOW (ref 3.5–5.2)
Alkaline Phosphatase: 42 U/L (ref 39–117)
BILIRUBIN DIRECT: 0.8 mg/dL — AB (ref 0.0–0.3)
BILIRUBIN INDIRECT: 0.8 mg/dL (ref 0.3–0.9)
Total Bilirubin: 1.6 mg/dL — ABNORMAL HIGH (ref 0.3–1.2)
Total Protein: 5.8 g/dL — ABNORMAL LOW (ref 6.0–8.3)

## 2014-07-17 LAB — LACTATE DEHYDROGENASE: LDH: 259 U/L — AB (ref 94–250)

## 2014-07-17 MED ORDER — PREDNISONE 20 MG PO TABS
40.0000 mg | ORAL_TABLET | Freq: Every day | ORAL | Status: DC
  Filled 2014-07-17: qty 2

## 2014-07-17 NOTE — Plan of Care (Signed)
Problem: Phase II Progression Outcomes Goal: Pain controlled Outcome: Completed/Met Date Met:  07/17/14     

## 2014-07-17 NOTE — Plan of Care (Signed)
Problem: Phase I Progression Outcomes Goal: Pain controlled with appropriate interventions Outcome: Completed/Met Date Met:  07/17/14     

## 2014-07-17 NOTE — Progress Notes (Signed)
Attempted to obtain consent for pt  To have thoracentesis.  Or der clarification for site needed.  Informed Gay Filler, nurse working the Dr. Linard Millers, who is unable to come to phone as he is in procedure.  Instructed by Gay Filler, nurse.  That Dr. Tamala Julian will call me back.  Pt's son Valere Dross informed and also let him know will call him back.  Karie Kirks, Therapist, sports.

## 2014-07-17 NOTE — Plan of Care (Signed)
Problem: Consults Goal: Heart Failure Patient Education (See Patient Education module for education specifics.)  Outcome: Not Applicable Date Met:  09/98/33 Patient has dementia

## 2014-07-17 NOTE — Progress Notes (Signed)
Physical Therapy Treatment Patient Details Name: [name] [name] MRN: [id number] DOB: 05/03/1931 Today's Date: 07/17/2014    History of Present Illness 80 year old male with a history of systolic and diastolic CHF, COPD, CKD stage III, diabetes mellitus type 2, dementia presents from [facility] with shortness of breath 1 day. Apparently, the nursing facility wanted to send the patient to the emergency department on the morning of 07/10/2014 because of shortness of breath. The patient's family took the patient home for Thanksgiving lunch. After he returned, the patient was even more short of breath. He presented to the emergency department where he was noted to have oxygen saturation of 90% on room air. Chest x-ray showed right pleural effusion. ProBNP was 8042. The patient was started on intravenous furosemide. The patient was recently discharged from the hospital on 06/24/2014 with CHF exacerbation.    PT Comments    Patient assisted OOB to chair and tolerated minimal ROM BLEs this session.  Patient declining further ambulation at this time, wanted to eat his breakfast. Will continue current POC.  Follow Up Recommendations  SNF     Equipment Recommendations  None recommended by PT    Recommendations for Other Services       Precautions / Restrictions Precautions Precautions: Fall Restrictions Weight Bearing Restrictions: No    Mobility  Bed Mobility Overal bed mobility: Needs Assistance Bed Mobility: Rolling;Sidelying to Sit;Sit to Supine Rolling: Min assist Sidelying to sit: Mod assist   Sit to supine: Mod assist   General bed mobility comments: Assist for initiation of movement and positioning, assist to elevate trunk to upright, and LEs to bed upon return  Transfers Overall transfer level: Needs assistance Equipment used: 1 person hand held assist Transfers: Sit to/from [employer] Transfers Sit to Stand: Mod assist         General transfer comment:  VCs for hand placement, need for elevation of bed to faciliate standing  Ambulation/Gait             General Gait Details: patient declining ambulation this session, but agreeable OOB to chair and minimal ROM acitivty   Stairs            Wheelchair Mobility    Modified Rankin (Stroke Patients Only)       Balance     Sitting balance-Leahy Scale: Fair       Standing balance-Leahy Scale: Poor                      Cognition Arousal/Alertness: Awake/alert Behavior During Therapy: WFL for tasks assessed/performed Overall Cognitive Status: Impaired/Different from baseline Area of Impairment: Attention;Following commands;Safety/judgement;Problem solving   Current Attention Level: Focused   Following Commands: Follows one step commands consistently;Follows multi-step commands inconsistently Safety/Judgement: Decreased awareness of safety   Problem Solving: Slow processing;Requires verbal cues;Requires tactile cues      Exercises      General Comments        Pertinent Vitals/Pain Pain Assessment: No/denies pain    Home Living Family/patient expects to be discharged to:: Skilled nursing facility               Additional Comments: Pt was living with son.    Prior Function Level of Independence: Needs assistance  Gait / Transfers Assistance Needed: Pt is a poor historian-- it appears he uses w/c most of the time and is supervised walking short distances. ADL's / Homemaking Assistance Needed: pt reports set up for bathing, dressing Comments: uncertain of ambulatory status  PT Goals (current goals can now be found in the care plan section) Acute Rehab PT Goals Patient Stated Goal: Not stated but agreeable to work on incr mobility PT Goal Formulation: With patient Time For Goal Achievement: 07/20/14 Potential to Achieve Goals: Good Progress towards PT goals: Progressing toward goals    Frequency  Min 2X/week    PT Plan Current plan  remains appropriate    Co-evaluation             End of Session Equipment Utilized During Treatment: Oxygen Activity Tolerance: Patient tolerated treatment well Patient left: in chair;with call bell/phone within reach; Nsg aware that call bell not working      Time: 0812-0830 PT Time Calculation (min) (ACUTE ONLY): 18 min  Charges:  $Therapeutic Activity: 8-22 mins                    G CodesTymothy, Cass Aug 05, 2014, 11:17 AM Alben Deeds, [place] DPT  [phone]

## 2014-07-17 NOTE — Plan of Care (Signed)
Problem: Consults Goal: Skin Care Protocol Initiated - if Braden Score 18 or less If consults are not indicated, leave blank or document N/A  Outcome: Completed/Met Date Met:  07/17/14

## 2014-07-17 NOTE — Progress Notes (Signed)
Patient ID: [name] [name]  male  FOY:[id number]    DOB: [birth year]    DOA: 07/10/2014  PCP: [name], NP  Brief history of present illness Patient is a 80 year old male with systolic and diastolic CHF, COPD, CAD stage III, diabetes type 2, dementia presented from skilled nursing facility with shortness of breath for at least one day prior to admission. Apparently, patient's family had taken the patient home for Thanksgiving lunch, after he returned he was more short of breath. Patient was found to have O2 sats of 90% on room air term, chest x-ray showed right pleural effusion, BNP 8042, patient was started on IV diuresis. He was recently discharged from the hospital on 06/24/14 with CHF exacerbation.  Assessment/Plan: Principal Problem:   Acute on chronic respiratory failure likely due to acute on chronic systolic and diastolic CHF exacerbation, possibly worsened due to dietary indiscretion and COPD exacerbation, right-sided pleural effusion - Recently discharged on 06/24/14 with CHF exacerbation - 2-D echo on 03/07/14 had shown EF 76-72%, grade 1 diastolic dysfunction. Nuclear medicine stress test on 7/26 showed no reversible ischemia, EF 42% - Patient had chest x-ray repeated on 12/1 showed worsening pulmonary edema - Lasix was increased on 12/1 with metolazone, cardiology consulted. Zaroxolyn discontinued, poor prognosis  Active Problems: COPD exacerbation - IV Solu-Medrol tapered, transitioned to oral prednisone,continue bronchodilators, O2  Right-sided pleural effusion - Pulmonary consulted for thoracentesis, will need diagnostic and therapeutic tap    CKD (chronic kidney disease), stage III - Cr 1.7, stable  Urinary retention: With hematuria, ? Traumatic - foley cath placed by urology, Dr Jeffie Pollock 11/30.  - DC'd heparin subcutaneous, placed on SCDs   Diabetes mellitus - Add meal coverage, continue sliding scale insulin - Hemoglobin A1c 5.7  Dementia Continue  rivastigmine patch  Hyperlipidemia -  continue statin and fenofibrate  Hypertension Currently stable  Bilateral lower extremities stasis ulcers - Continue dressing changes   DVT Prophylaxis:  Code Status: DO NOT RESUSCITATE  Family Communication: Discussed with patient's first contact listed, Ms Berdine Addison, I was unable to reach patient's son   Disposition: Possible LTAC candidate?  Consultants:  Cardiology   Urology   pulmonology    Procedures:  Foley catheter placement  Antibiotics:  none    Subjective:   denies any specific complaints today, shortness of breath is improving, no chest pain, abdominal pain, nausea or vomiting, tolerating diet, no acute issues overnight   Objective: Weight change: -8.5 kg (-18 lb 11.8 oz)  Intake/Output Summary (Last 24 hours) at 07/17/14 1146 Last data filed at 07/17/14 0857  Gross per 24 hour  Intake   1646 ml  Output   8050 ml  Net  -6404 ml   Blood pressure 131/63, pulse 92, temperature 98 F (36.7 C), temperature source Oral, resp. rate 18, height 5\' 7"  (1.702 m), weight 97.5 kg (214 lb 15.2 oz), SpO2 92 %.  Physical Exam: General: Alert and awake,  confused, oriented to self CVS: S1-S2 clear, no murmur rubs or gallops Chest: Decreased breath sounds at the bases, R>L Abdomen: soft nontender, nondistended, normal bowel sounds  Extremities: no cyanosis, clubbing, 2+ edema noted bilaterally   Lab Results: Basic Metabolic Panel:  Recent Labs Lab 07/12/14 0254  07/15/14 0333 07/16/14 0610  NA 139  < > 144 140  K 4.1  < > 4.1 4.2  CL 98  < > 98 95*  CO2 29  < > 35* 35*  GLUCOSE 174*  < > 141* 239*  BUN  56*  < > 78* 85*  CREATININE 1.66*  < > 1.80* 1.78*  CALCIUM 8.5  < > 8.4 8.3*  MG 2.3  --   --   --   < > = values in this interval not displayed. Liver Function Tests:  Recent Labs Lab 07/17/14 0406  AST 34  ALT 32  ALKPHOS 42  BILITOT 1.6*  PROT 5.8*  ALBUMIN 2.6*   No results for input(s):  LIPASE, AMYLASE in the last 168 hours. No results for input(s): AMMONIA in the last 168 hours. CBC:  Recent Labs Lab 07/10/14 2005  WBC 4.7  NEUTROABS 2.8  HGB 12.0*  HCT 36.5*  MCV 98.9  PLT 168   Cardiac Enzymes: No results for input(s): CKTOTAL, CKMB, CKMBINDEX, TROPONINI in the last 168 hours. BNP: Invalid input(s): POCBNP CBG:  Recent Labs Lab 07/16/14 1101 07/16/14 1719 07/16/14 2126 07/17/14 0642 07/17/14 1112  GLUCAP 248* 184* 192* 183* 113*     Micro Results: No results found for this or any previous visit (from the past 240 hour(s)).  Studies/Results: Dg Chest Port 1 View  07/17/2014   CLINICAL DATA:  Follow-up pleural effusion.  Subsequent encounter.  EXAM: PORTABLE CHEST - 1 VIEW  COMPARISON:  Chest radiograph performed 07/15/2014  FINDINGS: A relatively large right-sided pleural effusion is again noted, stable from the prior study. Underlying airspace opacification is again noted. Vascular congestion is noted. Findings raise concern for asymmetric pulmonary edema, though pneumonia cannot be excluded. No pneumothorax is seen.  The cardiomediastinal silhouette is enlarged. No acute osseous abnormalities are identified.  IMPRESSION: 1. Relatively large right-sided pleural effusion is stable from the prior study. Underlying airspace opacification and vascular congestion noted. Findings raise concern for asymmetric pulmonary edema, though pneumonia cannot be excluded. 2. Cardiomegaly.   Electronically Signed   By: Garald Balding M.D.   On: 07/17/2014 01:33   Dg Chest Port 1 View  07/15/2014   CLINICAL DATA:  Shortness of breath.  CHF.  EXAM: PORTABLE CHEST - 1 VIEW  COMPARISON:  07/10/2014, 07/01/2014, 12/31/2013.  FINDINGS: Cardiomegaly. Pulmonary vascular prominence noted on today's exam. Progressive right-sided pleural effusion noted. Increased density of the upper medial right chest may represent acute remain pleural fluid medially, this is progressed from prior  exam. Atelectasis right lower lobe. Pulmonary interstitial prominence. Congestive heart failure is most likely present. No pneumothorax.  IMPRESSION: 1. Progressive right pleural effusion. 2. Cardiomegaly. Pulmonary vascular prominence noted on today's exam. Progressive interstitial prominence noted. These findings are consistent with congestive heart failure. 3. Right lower lobe atelectasis.   Electronically Signed   By: Marcello Moores  Register   On: 07/15/2014 07:13   Dg Chest Port 1 View  07/10/2014   CLINICAL DATA:  Dyspnea  EXAM: PORTABLE CHEST - 1 VIEW  COMPARISON:  June 21, 2014  FINDINGS: There is a sizable pleural effusion on the right with consolidation involving the right middle and lower lobes. Left lung is clear. Heart is enlarged with pulmonary vascularity within normal limits. No adenopathy appreciable.  IMPRESSION: Persistent right effusion with consolidation involving essentially all of the right middle and lower lobes. Left lung clear. No change in cardiac silhouette.   Electronically Signed   By: Lowella Grip M.D.   On: 07/10/2014 21:25   Dg Chest Portable 1 View  06/21/2014   CLINICAL DATA:  Shortness of breath.  Bilateral leg swelling.  EXAM: PORTABLE CHEST - 1 VIEW  COMPARISON:  04/05/2014  FINDINGS: Shallow inspiration. Cardiac enlargement with pulmonary  vascular congestion and perihilar edema. Moderate size right pleural effusion with basilar atelectasis. Changes are similar to those seen on previous study. No pneumothorax. Calcification of aorta.  IMPRESSION: Cardiac enlargement with pulmonary vascular congestion and perihilar edema. Right pleural effusion with basilar atelectasis.   Electronically Signed   By: Lucienne Capers M.D.   On: 06/21/2014 03:06    Medications: Scheduled Meds: . albuterol  2.5 mg Nebulization TID  . aspirin  324 mg Oral Once  . aspirin EC  81 mg Oral QPM  . atorvastatin  20 mg Oral QPM  . budesonide  0.25 mg Nebulization BID  . carvedilol  6.25 mg  Oral BID WC  . feeding supplement (PRO-STAT SUGAR FREE 64)  30 mL Oral TID WC  . fenofibrate  160 mg Oral QPM  . furosemide  120 mg Intravenous 3 times per day  . gabapentin  200 mg Oral BID  . insulin aspart  0-15 Units Subcutaneous TID WC  . insulin aspart  3 Units Subcutaneous TID WC  . isosorbide-hydrALAZINE  1 tablet Oral TID  . latanoprost  1 drop Left Eye QHS  . mupirocin ointment  1 application Nasal BID  . [START ON 07/18/2014] predniSONE  40 mg Oral Q breakfast  . rivastigmine  4.6 mg Transdermal Daily  . tobramycin  2 drop Left Eye 6 times per day  . traZODone  50 mg Oral QHS      LOS: 7 days   RAI,RIPUDEEP M.D. [facility] 07/17/2014, 11:46 AM Pager: [phone]  If 7PM-7AM, please contact night-coverage [email or web address] Password [email or web address]

## 2014-07-17 NOTE — Progress Notes (Addendum)
Call received from [facility] nurse in cath lab informing that clarification on site for thoracentesis is Right side as ordered by Dr. Linard Millers. Jr.Order place on nursing care note.  Karie Kirks, Therapist, sports.

## 2014-07-17 NOTE — Plan of Care (Signed)
Problem: Phase I Progression Outcomes Goal: Dyspnea controlled at rest (HF) Outcome: Completed/Met Date Met:  07/17/14     

## 2014-07-17 NOTE — Progress Notes (Signed)
    Subjective:  Up in chair. Still SOB- on O2.  Objective:  Vital Signs in the last 24 hours: Temp:  [97.7 F (36.5 C)-98 F (36.7 C)] 98 F (36.7 C) (12/03 0500) Pulse Rate:  [65-92] 92 (12/03 0500) Resp:  [18] 18 (12/03 0500) BP: (126-155)/(51-79) 135/75 mmHg (12/03 0500) SpO2:  [92 %-98 %] 92 % (12/03 0500) Weight:  [214 lb 15.2 oz (97.5 kg)] 214 lb 15.2 oz (97.5 kg) (12/03 0500)  Intake/Output from previous day:  Intake/Output Summary (Last 24 hours) at 07/17/14 0855 Last data filed at 07/17/14 0646  Gross per 24 hour  Intake   1762 ml  Output   8050 ml  Net  -6288 ml    Physical Exam: General appearance: alert, cooperative, no distress and moderately obese Lungs: clear on Lt, decreased breath sounds 2/3 up on Rt Heart: regular rate and rhythm Extremities: no edema   Rate: 92  Rhythm: normal sinus rhythm  Lab Results: No results for input(s): WBC, HGB, PLT in the last 72 hours.  Recent Labs  07/15/14 0333 07/16/14 0610  NA 144 140  K 4.1 4.2  CL 98 95*  CO2 35* 35*  GLUCOSE 141* 239*  BUN 78* 85*  CREATININE 1.80* 1.78*   No results for input(s): TROPONINI in the last 72 hours.  Invalid input(s): CK, MB No results for input(s): INR in the last 72 hours.  Imaging: Imaging results have been reviewed  Cardiac Studies:  Assessment/Plan:  80 yo with history of interstitial lung disease, Rt and Lt heart failure, and recurrent Rt pleural effusion. The pt is demented and lives in a SNF. He was admitted 07/10/14 from NH with acute on chronic respiratory failure. He had just been in the hospital and discharged 06/24/14 after a CHF exacerbation. CXR on 07/15/14 showed worsening Rt effusion. Pulmonary and cardiology were consulted.    Principal Problem:   Acute on chronic respiratory failure Active Problems:    Recurrent Rt pleural effussion   Acute on chronic combined systolic and diastolic congestive heart failure   CKD (chronic kidney disease), stage  III   COPD (chronic obstructive pulmonary disease)   Dementia-SNF pt   Type 2 diabetes mellitus with autonomic neuropathy   Cardiomyopathy-EF 40-45%   Hyperlipidemia   Essential hypertension   PLAN: The plan was for thoracentesis yesterday but the staff was unable to contact pt's POA and he could not be consented. He has diuresed (10L since admission) but still has a significant effusion on exam. BUN trending up- ? Back off on diuresis, thoracentesis when consent obtained.   Kerin Ransom PA-C Beeper [phone] 07/17/2014, 8:55 AM As above, patient seen and examined. Thoracentesis was not performed as apparently power of attorney could not be contacted. He has worsening prerenal azotemia. I will continue Lasix but discontinue Zaroxolyn. Follow renal function. Patient is chronically ill and a no CODE BLUE. His long term prognosis is extremely poor. Would recommend palliative care consult. Kirk Ruths

## 2014-07-17 NOTE — Progress Notes (Signed)
Consent received from pt's son, Valere Dross giving T.O. consent to have thoracentesis , right side done as ordered.   Consent verified by CN. Margarita Grizzle, RN.  Will continue to monitor.  Karie Kirks, Therapist, sports.

## 2014-07-17 NOTE — Progress Notes (Signed)
Name: [name] [name] MRN: [id number] DOB: [birth year]    ADMISSION DATE:  07/10/2014 CONSULTATION DATE:  08/13/2014  REFERRING MD :  Dr. Tana Coast  CHIEF COMPLAINT:  SOB  BRIEF PATIENT DESCRIPTION: 80 year old male who presented to ED 11/26 c/o SOB. He was admitted for COPD/CHF exacerbation. He was treated with the usual modalities, however his effusion continued to increase in size. 12/2 PCCM was consulted for potential thoracentesis.   SIGNIFICANT EVENTS  12/2 Thora >   STUDIES:  03/07/2014 echocardiogram EF 40-45 percent, grade 1 diastolic dysfunction   SUBJECTIVE:  Denies pain, SOB.  Unable to do thoracentesis in IR yesterday r/t unable to find POA for consent.   VITAL SIGNS: Temp:  [97.7 F (36.5 C)-98 F (36.7 C)] 98 F (36.7 C) (12/03 0500) Pulse Rate:  [65-92] 92 (12/03 0500) Resp:  [18] 18 (12/03 0500) BP: (126-155)/(51-79) 131/63 mmHg (12/03 1020) SpO2:  [92 %-98 %] 92 % (12/03 0500) Weight:  [214 lb 15.2 oz (97.5 kg)] 214 lb 15.2 oz (97.5 kg) (12/03 0500)  PHYSICAL EXAMINATION: General:  Overweight male in NAD OOB in chair Neuro:  Spontaneously awake, alert, disoriented, does not interact much, does not follow commands.  HEENT:  [name]/AT, Poor dentition, PERRL, No JVD noted.  Cardiovascular:  RRR, no MRG Lungs:  Respitations even, unlabored. No wheeze. Diminished R breath sounds. Abdomen:  Soft, non-tender, non-distended Musculoskeletal:  No acute deformity Skin:  Intact   Recent Labs Lab 07/14/14 0444 07/15/14 0333 07/16/14 0610  NA 141 144 140  K 4.2 4.1 4.2  CL 99 98 95*  CO2 33* 35* 35*  BUN 71* 78* 85*  CREATININE 1.77* 1.80* 1.78*  GLUCOSE 162* 141* 239*    Recent Labs Lab 07/10/14 2005  HGB 12.0*  HCT 36.5*  WBC 4.7  PLT 168   Dg Chest Port 1 View  07/17/2014   CLINICAL DATA:  Follow-up pleural effusion.  Subsequent encounter.  EXAM: PORTABLE CHEST - 1 VIEW  COMPARISON:  Chest radiograph performed 07/15/2014  FINDINGS: A relatively large  right-sided pleural effusion is again noted, stable from the prior study. Underlying airspace opacification is again noted. Vascular congestion is noted. Findings raise concern for asymmetric pulmonary edema, though pneumonia cannot be excluded. No pneumothorax is seen.  The cardiomediastinal silhouette is enlarged. No acute osseous abnormalities are identified.  IMPRESSION: 1. Relatively large right-sided pleural effusion is stable from the prior study. Underlying airspace opacification and vascular congestion noted. Findings raise concern for asymmetric pulmonary edema, though pneumonia cannot be excluded. 2. Cardiomegaly.   Electronically Signed   By: Garald Balding M.D.   On: 07/17/2014 01:33    ASSESSMENT / PLAN:  Large R Pleural effusion transudate in past, last tap 12/2013 by PCCM Acute on chronic CHF AECOPD REC -  - Supplemental O2 to maintain SpO2 greater than 92% - cont diureses per cardiology - lasix, zaroxolyn - Goal negative fluid balance - Continue scheduled and PRN albuterol - Add budesonide nebs BID - Continue prednisone taper as able -- down to 40mg  12/4 - f/u CXR  - unable to do thora 12/2 r/t unable to get in touch with POA for consent - No respiratory distress, pt appears comfortable, significant dementia, DNR - would hold off on thoracentesis for now.  Continue diuresis, consider therapeutic thora only if worsens clinically  - consider palliative care input for symptom mgmt, goals of care    [name], NP 07/17/2014  10:25 AM Pager: [phone] or [phone]  PCCM ATTENDING: Pt seen on work rounds with care provider noted above. We reviewed pt's initial presentation, consultants notes and hospital database in detail.  The above assessment and plan was formulated under my direction.  In summary: This is a recurrent pleural effusion and has appearance by CXR very similar to that which was tapped in May of this year Previously this was a transudate and  attributed (rightly, I think) to heart failure He is minimally symptomatic with this effusion and therefore, there is little to be gained by thoracentesis There is no wheezing, no evidence of acute bronchospasm  REC: Optimize med Rx for heart failure Forgo thoracentesis for now given prior findings of transudate DC systemic steroids in absence of wheezing Consider changing carvedilol to a cardioselective beta blocker such as metoprolol, atenolol or bisoprolol In general, would be cautious with beta blockers given COPD and difficult-to-control CHF  While the data support a long term benefit from beta blockers in CHF, they can acutely exacerbate it   Since our goals here are less focussed on the "long term", could consider DC altogether  PCCM will sign off. Please call if we can be of further assistance  Merton Border, MD;  PCCM service; Mobile [phone]

## 2014-07-18 ENCOUNTER — Ambulatory Visit (HOSPITAL_COMMUNITY): Payer: PRIVATE HEALTH INSURANCE

## 2014-07-18 ENCOUNTER — Inpatient Hospital Stay (HOSPITAL_COMMUNITY): Payer: PRIVATE HEALTH INSURANCE

## 2014-07-18 DIAGNOSIS — F0390 Unspecified dementia without behavioral disturbance: Secondary | ICD-10-CM

## 2014-07-18 DIAGNOSIS — R279 Unspecified lack of coordination: Secondary | ICD-10-CM

## 2014-07-18 LAB — GLUCOSE, CAPILLARY
GLUCOSE-CAPILLARY: 212 mg/dL — AB (ref 70–99)
Glucose-Capillary: 157 mg/dL — ABNORMAL HIGH (ref 70–99)
Glucose-Capillary: 156 mg/dL — ABNORMAL HIGH (ref 70–99)
Glucose-Capillary: 216 mg/dL — ABNORMAL HIGH (ref 70–99)
Glucose-Capillary: 160 mg/dL — ABNORMAL HIGH (ref 70–99)

## 2014-07-18 LAB — BLOOD GAS, ARTERIAL
ACID-BASE EXCESS: 22.1 mmol/L — AB (ref 0.0–2.0)
BICARBONATE: 47.7 meq/L — AB (ref 20.0–24.0)
Drawn by: 275531
O2 Content: 2 L/min
O2 SAT: 96.5 %
PATIENT TEMPERATURE: 98.6
TCO2: 49.7 mmol/L (ref 0–100)
pCO2 arterial: 65.5 mmHg (ref 35.0–45.0)
pH, Arterial: 7.476 — ABNORMAL HIGH (ref 7.350–7.450)
pO2, Arterial: 83.5 mmHg (ref 80.0–100.0)

## 2014-07-18 LAB — BASIC METABOLIC PANEL
ANION GAP: 14 (ref 5–15)
Anion gap: 8 (ref 5–15)
BUN: 105 mg/dL — AB (ref 6–23)
BUN: 100 mg/dL — ABNORMAL HIGH (ref 6–23)
CHLORIDE: 86 meq/L — AB (ref 96–112)
CO2: 39 meq/L — AB (ref 19–32)
CO2: 44 meq/L — AB (ref 19–32)
Calcium: 9 mg/dL (ref 8.4–10.5)
Calcium: 9.3 mg/dL (ref 8.4–10.5)
Chloride: 89 mEq/L — ABNORMAL LOW (ref 96–112)
Creatinine, Ser: 1.98 mg/dL — ABNORMAL HIGH (ref 0.50–1.35)
Creatinine, Ser: 1.96 mg/dL — ABNORMAL HIGH (ref 0.50–1.35)
GFR calc Af Amer: 35 mL/min — ABNORMAL LOW (ref >90)
GFR calc non Af Amer: 30 mL/min — ABNORMAL LOW (ref >90)
GFR calc non Af Amer: 30 mL/min — ABNORMAL LOW (ref >90)
GFR, EST AFRICAN AMERICAN: 35 mL/min — AB (ref >90)
GLUCOSE: 89 mg/dL (ref 70–99)
Glucose, Bld: 181 mg/dL — ABNORMAL HIGH (ref 70–99)
POTASSIUM: 4.6 meq/L (ref 3.7–5.3)
POTASSIUM: 4 meq/L (ref 3.7–5.3)
Sodium: 139 mEq/L (ref 137–147)
Sodium: 141 mEq/L (ref 137–147)

## 2014-07-18 LAB — TSH: TSH: 1.91 u[IU]/mL (ref 0.350–4.500)

## 2014-07-18 LAB — CBC
HEMATOCRIT: 40.9 % (ref 39.0–52.0)
HEMOGLOBIN: 13.7 g/dL (ref 13.0–17.0)
MCH: 32.5 pg (ref 26.0–34.0)
MCHC: 33.5 g/dL (ref 30.0–36.0)
MCV: 97.1 fL (ref 78.0–100.0)
Platelets: 164 10*3/uL (ref 150–400)
RBC: 4.21 MIL/uL — AB (ref 4.22–5.81)
RDW: 15.1 % (ref 11.5–15.5)
WBC: 8 10*3/uL (ref 4.0–10.5)

## 2014-07-18 LAB — AMMONIA: Ammonia: 63 umol/L — ABNORMAL HIGH (ref 11–60)

## 2014-07-18 LAB — MAGNESIUM: Magnesium: 2.4 mg/dL (ref 1.5–2.5)

## 2014-07-18 MED ORDER — INSULIN ASPART 100 UNIT/ML ~~LOC~~ SOLN
5.0000 [IU] | Freq: Three times a day (TID) | SUBCUTANEOUS | Status: DC
  Administered 2014-07-18 – 2014-07-21 (×5): 5 [IU] via SUBCUTANEOUS

## 2014-07-18 MED ORDER — FUROSEMIDE 10 MG/ML IJ SOLN
80.0000 mg | Freq: Two times a day (BID) | INTRAMUSCULAR | Status: DC
  Administered 2014-07-18 – 2014-07-21 (×6): 80 mg via INTRAVENOUS
  Filled 2014-07-18 (×9): qty 8

## 2014-07-18 MED ORDER — FUROSEMIDE 10 MG/ML IJ SOLN
INTRAMUSCULAR | Status: AC
  Filled 2014-07-18: qty 4

## 2014-07-18 NOTE — Progress Notes (Signed)
I was informed by patient's RN that patient's family including son Valere Dross are present in the room. While updating the family, I noticed that patient was lethargic and not responding to verbal commands. He was also having some jerking movements of his face and right arm. At the time of my examination earlier in the morning, patient was somewhat alert and refusing exam.  Vital signs stable, CBGs was 156. I requested for rapid response to evaluate patient  Stat ABG ordered, CT head and EEG Discussed with neurology, Dr. Janann Colonel, will evaluate patient.  Discussed in detail with patient's son regarding overall poor prognosis and updated in detail. Mr. Wynetta Emery was visibly upset and frustrated over why thoracentesis was not done. I encouraged him to discuss with cardiology and pulmonology if he has any questions over Mr. Czaja's status. CODE STATUS was discussed and he wants to continue DNR status. He is agreeable to discuss with palliative medicine for goals of care.   Kalanie Fewell M.D. [facility] 07/18/2014, 12:50 PM  Pager: [phone]

## 2014-07-18 NOTE — Progress Notes (Signed)
Panic level of ABG results and high Co2 communicated to MD. No new orders at this time.

## 2014-07-18 NOTE — Progress Notes (Signed)
Per request of MD- was able to locate patient's son [name] as attempts have been made to obtain consent for thorencentesis. Son stated that his name was listed incorrectly in the chart and his last name is Wynetta Emery. CSW notified Admitting office and this was changed.  Son stated he would be on standby to talk to MD or RN to give verbal consent for procedure.  CSW notified patient's nurse- Nellie and Dr. Tana Coast.  Lorie Phenix. Pauline Good, [place]

## 2014-07-18 NOTE — Progress Notes (Signed)
Thank you for consulting the Palliative Medicine Team at [facility] to meet your patient's and family's needs.   The reason that you asked [place] to see your patient is  For Clarification of GOC and options  Presumed surrogate decision make is his son Royal Piedra (name wrong in contacts) # [phone]  --attempted to contact multiple times without success  Also spoke with daughter Jaquita Folds  # [phone] and contact Claretta Fraise # [phone] and both defer all conversation and decisions  to [place].  Await call back or contact  Wadie Lessen NP  Palliative Medicine Team Team Phone # [phone] Pager [phone]  Discussed with  Kendell Bane LCSW

## 2014-07-18 NOTE — Progress Notes (Signed)
Rapid Response Nurse called to see patient because he looked more lethargic room his baseline and family were concerned about patient's condition. Vitals taken stable. See in Epic. MD ordered Stat Labs, EEG and Head CT. Awaiting on the investigations to be done.

## 2014-07-18 NOTE — Progress Notes (Signed)
Patient ID: [name] [name]  male  XBM:[id number]    DOB: [birth year]    DOA: 07/10/2014  PCP: [name], NP  Brief history of present illness Patient is a 80 year old male with systolic and diastolic CHF, COPD, CAD stage III, diabetes type 2, dementia presented from skilled nursing facility with shortness of breath for at least one day prior to admission. Apparently, patient's family had taken the patient home for Thanksgiving lunch, after he returned he was more short of breath. Patient was found to have O2 sats of 90% on room air term, chest x-ray showed right pleural effusion, BNP 8042, patient was started on IV diuresis. He was recently discharged from the hospital on 06/24/14 with CHF exacerbation.  Assessment/Plan: Principal Problem:   Acute on chronic respiratory failure likely due to acute on chronic systolic and diastolic CHF exacerbation, possibly worsened due to dietary indiscretion and COPD exacerbation, right-sided pleural effusion. Recently discharged on 06/24/14 with CHF exacerbation - 2-D echo on 03/07/14 had shown EF 02-72%, grade 1 diastolic dysfunction. Nuclear medicine stress test on 7/26 showed no reversible ischemia, EF 42% - Patient had chest x-ray repeated on 12/1 showed worsening pulmonary edema -Cardiology following, recommending to continue Lasix but discontinue Zaroxolyn. Long-term prognosis poor, recommended palliative care consult  - Palliative consult placed  Active Problems: COPD exacerbation - IV Solu-Medrol tapered, transitioned to oral prednisone,continue bronchodilators, O2  Right-sided pleural effusion- recurrent - Pulmonary was consulted, Dr Alva Garnet did not recommend thoracentesis due to prior findings of transudate and recommended optimize adequate therapy for CHF discontinue steroids, changed to metoprolol or no beta blockers. PCCM Signed off     CKD (chronic kidney disease), stage III - Cr 1.9 , trending up  Urinary retention: With hematuria, ?  Traumatic - foley cath placed by urology, Dr Jeffie Pollock 11/30.  - DC'd heparin subcutaneous, placed on SCDs   Diabetes mellitus - Add meal coverage, continue sliding scale insulin - Hemoglobin A1c 5.7  Dementia Continue rivastigmine patch  Hyperlipidemia -  continue statin and fenofibrate  Hypertension Currently stable  Bilateral lower extremities stasis ulcers - Continue dressing changes   DVT Prophylaxis:  Code Status: DO NOT RESUSCITATE  Family Communication:   Disposition: Possible LTAC candidate?  Consultants:  Cardiology   Urology   pulmonology    Procedures:  Foley catheter placement  Antibiotics:  none    Subjective:  Confused, denies any specific complaint, no chest pain or shortness of breath on exam   Objective: Weight change: -5.6 kg (-12 lb 5.5 oz)  Intake/Output Summary (Last 24 hours) at 07/18/14 0839 Last data filed at 07/18/14 0730  Gross per 24 hour  Intake   1180 ml  Output   9475 ml  Net  -8295 ml   Blood pressure 146/81, pulse 67, temperature 98.4 F (36.9 C), temperature source Oral, resp. rate 20, height 5\' 7"  (1.702 m), weight 91.9 kg (202 lb 9.6 oz), SpO2 98 %.  Physical Exam: General: awake  confused, oriented to self CVS: S1-S2 clear Chest: Decreased breath sounds at the bases, R>L Abdomen: soft NT, ND, NBS  Extremities: no cyanosis, clubbing, 2+ edema noted b/l   Lab Results: Basic Metabolic Panel:  Recent Labs Lab 07/12/14 0254  07/16/14 0610 07/18/14 0410  NA 139  < > 140 139  K 4.1  < > 4.2 4.6  CL 98  < > 95* 86*  CO2 29  < > 35* 39*  GLUCOSE 174*  < > 239* 181*  BUN 56*  < >  85* 105*  CREATININE 1.66*  < > 1.78* 1.98*  CALCIUM 8.5  < > 8.3* 9.0  MG 2.3  --   --   --   < > = values in this interval not displayed. Liver Function Tests:  Recent Labs Lab 07/17/14 0406  AST 34  ALT 32  ALKPHOS 42  BILITOT 1.6*  PROT 5.8*  ALBUMIN 2.6*   No results for input(s): LIPASE, AMYLASE in the last  168 hours. No results for input(s): AMMONIA in the last 168 hours. CBC:  Recent Labs Lab 07/18/14 0410  WBC 8.0  HGB 13.7  HCT 40.9  MCV 97.1  PLT 164   Cardiac Enzymes: No results for input(s): CKTOTAL, CKMB, CKMBINDEX, TROPONINI in the last 168 hours. BNP: Invalid input(s): POCBNP CBG:  Recent Labs Lab 07/17/14 0642 07/17/14 1112 07/17/14 1613 07/17/14 2056 07/18/14 0638  GLUCAP 183* 113* 195* 177* 212*     Micro Results: No results found for this or any previous visit (from the past 240 hour(s)).  Studies/Results: Dg Chest Port 1 View  07/17/2014   CLINICAL DATA:  Follow-up pleural effusion.  Subsequent encounter.  EXAM: PORTABLE CHEST - 1 VIEW  COMPARISON:  Chest radiograph performed 07/15/2014  FINDINGS: A relatively large right-sided pleural effusion is again noted, stable from the prior study. Underlying airspace opacification is again noted. Vascular congestion is noted. Findings raise concern for asymmetric pulmonary edema, though pneumonia cannot be excluded. No pneumothorax is seen.  The cardiomediastinal silhouette is enlarged. No acute osseous abnormalities are identified.  IMPRESSION: 1. Relatively large right-sided pleural effusion is stable from the prior study. Underlying airspace opacification and vascular congestion noted. Findings raise concern for asymmetric pulmonary edema, though pneumonia cannot be excluded. 2. Cardiomegaly.   Electronically Signed   By: Garald Balding M.D.   On: 07/17/2014 01:33   Dg Chest Port 1 View  07/15/2014   CLINICAL DATA:  Shortness of breath.  CHF.  EXAM: PORTABLE CHEST - 1 VIEW  COMPARISON:  07/10/2014, 07/01/2014, 12/31/2013.  FINDINGS: Cardiomegaly. Pulmonary vascular prominence noted on today's exam. Progressive right-sided pleural effusion noted. Increased density of the upper medial right chest may represent acute remain pleural fluid medially, this is progressed from prior exam. Atelectasis right lower lobe. Pulmonary  interstitial prominence. Congestive heart failure is most likely present. No pneumothorax.  IMPRESSION: 1. Progressive right pleural effusion. 2. Cardiomegaly. Pulmonary vascular prominence noted on today's exam. Progressive interstitial prominence noted. These findings are consistent with congestive heart failure. 3. Right lower lobe atelectasis.   Electronically Signed   By: Marcello Moores  Register   On: 07/15/2014 07:13   Dg Chest Port 1 View  07/10/2014   CLINICAL DATA:  Dyspnea  EXAM: PORTABLE CHEST - 1 VIEW  COMPARISON:  June 21, 2014  FINDINGS: There is a sizable pleural effusion on the right with consolidation involving the right middle and lower lobes. Left lung is clear. Heart is enlarged with pulmonary vascularity within normal limits. No adenopathy appreciable.  IMPRESSION: Persistent right effusion with consolidation involving essentially all of the right middle and lower lobes. Left lung clear. No change in cardiac silhouette.   Electronically Signed   By: Lowella Grip M.D.   On: 07/10/2014 21:25   Dg Chest Portable 1 View  06/21/2014   CLINICAL DATA:  Shortness of breath.  Bilateral leg swelling.  EXAM: PORTABLE CHEST - 1 VIEW  COMPARISON:  04/05/2014  FINDINGS: Shallow inspiration. Cardiac enlargement with pulmonary vascular congestion and perihilar edema. Moderate size  right pleural effusion with basilar atelectasis. Changes are similar to those seen on previous study. No pneumothorax. Calcification of aorta.  IMPRESSION: Cardiac enlargement with pulmonary vascular congestion and perihilar edema. Right pleural effusion with basilar atelectasis.   Electronically Signed   By: Lucienne Capers M.D.   On: 06/21/2014 03:06    Medications: Scheduled Meds: . albuterol  2.5 mg Nebulization TID  . aspirin  324 mg Oral Once  . aspirin EC  81 mg Oral QPM  . atorvastatin  20 mg Oral QPM  . budesonide  0.25 mg Nebulization BID  . carvedilol  6.25 mg Oral BID WC  . feeding supplement (PRO-STAT  SUGAR FREE 64)  30 mL Oral TID WC  . fenofibrate  160 mg Oral QPM  . furosemide  120 mg Intravenous 3 times per day  . gabapentin  200 mg Oral BID  . insulin aspart  0-15 Units Subcutaneous TID WC  . insulin aspart  5 Units Subcutaneous TID WC  . isosorbide-hydrALAZINE  1 tablet Oral TID  . latanoprost  1 drop Left Eye QHS  . mupirocin ointment  1 application Nasal BID  . rivastigmine  4.6 mg Transdermal Daily  . tobramycin  2 drop Left Eye 6 times per day  . traZODone  50 mg Oral QHS      LOS: 8 days   Demareon Coldwell M.D. [facility] 07/18/2014, 8:39 AM Pager: [phone]  If 7PM-7AM, please contact night-coverage [email or web address] Password [email or web address]

## 2014-07-18 NOTE — Progress Notes (Signed)
CRITICAL VALUE ALERT  Critical value received: Co2  Date of notification:  07/18/14  Time of notification:  3567  Critical value read back:Yes.    Nurse who received alert:  Marylen Ponto  MD notified (1st page):  Dr. Tana Coast  Time of first page: 1539  MD notified (2nd page): N/A  Time of second page:N/A  Responding MD:  Dr. Tana Coast  Time MD responded: 1539

## 2014-07-18 NOTE — Progress Notes (Signed)
No change in d/c plan. Patient will return to [facility] when medically stable unless RNCM is able to arrange for transfer LTAC due to current high dose of IV lasix . SNF cannot accept patient if receiving IV lasix.  Per MD not- Rapid Response had to be called on patient a short while ago.  Son was present in room and met with Dr. Tana Coast.  CSW discussed above with RNCM- Crystal.  She is still awaiting call back from [facility] rep to discuss referral.  [facility] has reviewed and unable to offer a bed.  Lorie Phenix. Pauline Good, [place]

## 2014-07-18 NOTE — Significant Event (Signed)
Rapid Response Event Note  Overview: Time Called: 1150 Arrival Time: 1153    Initial Focused Assessment:  Called by primary rn at request of MD.  Upon my arrival to patients room, RN and family at bedside.  Patient is arouseable, but lethargic.  PAtient is noted to have body tremors.    Interventions:  VSS, CBG 156.  Dr. Tana Coast at bedside now.  Orders received, patient to have CT scan of head.     Event Summary:  Rn to call if assistance needed   at      at          [facility], Harlin Rain

## 2014-07-18 NOTE — Progress Notes (Signed)
    Subjective:  Poorly responsive  Objective:  Vital Signs in the last 24 hours: Temp:  [97.9 F (36.6 C)-98.4 F (36.9 C)] 98.4 F (36.9 C) (12/04 0654) Pulse Rate:  [63-67] 67 (12/04 0654) Resp:  [16-20] 20 (12/04 0654) BP: (125-146)/(43-81) 146/81 mmHg (12/04 0654) SpO2:  [92 %-100 %] 98 % (12/04 0654) Weight:  [202 lb 9.6 oz (91.9 kg)] 202 lb 9.6 oz (91.9 kg) (12/04 0654)  Intake/Output from previous day:  Intake/Output Summary (Last 24 hours) at 07/18/14 1050 Last data filed at 07/18/14 1009  Gross per 24 hour  Intake    720 ml  Output  10700 ml  Net  -9980 ml    Physical Exam: General appearance: poorly responsive; chronically ill appearing HEENT poor dentition Neck supple Lungs: clear on Lt, decreased breath sounds 2/3 up on Rt Heart: regular rate and rhythm Abd: soft Extremities: 1+ edema   Lab Results:  Recent Labs  07/18/14 0410  WBC 8.0  HGB 13.7  PLT 164    Recent Labs  07/16/14 0610 07/18/14 0410  NA 140 139  K 4.2 4.6  CL 95* 86*  CO2 35* 39*  GLUCOSE 239* 181*  BUN 85* 105*  CREATININE 1.78* 1.98*    Cardiac Studies:  Assessment/Plan:  80 yo with history of interstitial lung disease, Rt and Lt heart failure, and recurrent Rt pleural effusion. The pt is demented and lives in a SNF. He was admitted 07/10/14 from NH with acute on chronic respiratory failure. He had just been in the hospital and discharged 06/24/14 after a CHF exacerbation. CXR on 07/15/14 showed worsening Rt effusion.    Principal Problem:   Acute on chronic respiratory failure Active Problems:   CKD (chronic kidney disease), stage III    Recurrent Rt pleural effussion   COPD (chronic obstructive pulmonary disease)   Acute on chronic combined systolic and diastolic congestive heart failure   Dementia-SNF pt   Hyperlipidemia   Essential hypertension   Type 2 diabetes mellitus with autonomic neuropathy   Cardiomyopathy-EF 40-45%   CHF exacerbation   PLAN:  1  acute on chronic combined systolic/diastolic congestive heart failure-Patient's renal function continues to deteriorate. Thoracentesis is not planned for right effusion per pulmonary. I will decrease Lasix to 80 mg IV twice a day. His BUN is now greater than 100. I think his prognosis is extremely poor. Awaiting palliative care consult. 2 Stage III chronic renal failure-as above we are decreasing diuretics. 3 COPD-management per pulmonary. 4 dementia 5 right pleural effusion-felt secondary to congestive heart failure. Pulmonary does not plan thoracentesis. Kirk Ruths

## 2014-07-18 NOTE — Progress Notes (Signed)
Nutrition Brief Note  Malnutrition Screening Tool result is inaccurate. Up until today patient was consuming 50-100% of meals. Per MD note, pt is now poorly responsive, awaiting palliative consult.   Please consult dietitian if nutrition needs are identified.  Pryor Ochoa RD, LDN Inpatient Clinical Dietitian Pager: [phone] After Hours Pager: [phone]

## 2014-07-18 NOTE — Consult Note (Addendum)
NEURO HOSPITALIST CONSULT NOTE    Reason for Consult: possible seizure activity  HPI:                                                                                                                                          [name] [name] is an 80 y.o. male with DM dementia, CAD, CHF  initially brought to hospital from SNF with SOB.  patient was brought to ED and found to have O2 saturation of 90% and CXR showing right pleural effusion, BNP 8042 and diagnosed with Acute on chronic respiratory failure likely due to acute on chronic systolic and diastolic CHF exacerbation, possibly worsened due to dietary indiscretion and COPD exacerbation.  Today nurses noted a decrease in mental status along with facial and bilateral arm movements. Neurology was consulted for further evaluation of possible seizure. On arrival patient is very lethargic, non vocal but will quickly become alert to painful stimuli and able to follow simple commands.  At rest he shows mild multifocal twitching in arms and hands.  When asked to raise his arms he did so without difficulty and showed asterixis of bilateral arms.    Past Medical History  Diagnosis Date  . Hypertension   . Diabetes mellitus     a. Diet-controlled.  . Arthritis   . Chronic diastolic CHF (congestive heart failure)     a. Primarily diastolic CHF & RV dysfunction but systolic dysfunction. 2D Echo 03/07/14 showed EF 40-45%, diffuse HK, grade 1 diastolic dysfunction, mild MR/AI, mod dilated LA/RA, mildly dilated RV, PA pressure 75mHg. Lexiscan nuc 02/2014: EF 43% with no ischemia, inferior hypokinesis with "inferior attenuation." Dr. Aundra Dubin suspected probable prior inferior MI.  Marland Kitchen COPD (chronic obstructive pulmonary disease)   . Hyperlipidemia   . CKD (chronic kidney disease), stage III     renal insuficiency  . Inferior MI     a. Based on nuc results 02/2014, Dr. Aundra Dubin suspected probable prior inferior MI (EF 43% with no ischemia, inferior  hypokinesis with "inferior attenuation").  . Dementia   . PAD (peripheral artery disease)     a. ABI 02/2014: R 0.45, L 0.56. R foot nonhealing ulcers.  . Mild mitral regurgitation   . Mild AI (aortic insufficiency)   . Lung disease     a. Possible interstitial lung disease: Followed by [name]. PFTs (1/15) with FVC 53%, FEV1 46%, ratio 85%, TLC 62%, DLCO 45% (restrictive defect).  . Human metapneumovirus pneumonia     a. Metapneumovirus PNA in 5/15 with acute respiratory failure and intubation. Bilateral transudative pleural effusions.    Past Surgical History  Procedure Laterality Date  . Leg surgery      both legs - unsure of exact operations - from trauma  . Tonsillectomy    .  Appendectomy    . Cataract extraction w/phaco Left 09/04/2013    Procedure: CATARACT EXTRACTION PHACO AND INTRAOCULAR LENS PLACEMENT (IOC);  Surgeon: Adonis Brook, MD;  Location: [facility];  Service: Ophthalmology;  Laterality: Left;    Family History  Problem Relation Age of Onset  . Diabetes Father   . Diabetes Mother      Social History:  reports that he quit smoking about 5 years ago. His smoking use included Cigarettes. He has a 35 pack-year smoking history. He has never used smokeless tobacco. He reports that he does not drink alcohol or use illicit drugs.  No Known Allergies  MEDICATIONS:                                                                                                                     Prior to Admission:  Prescriptions prior to admission  Medication Sig Dispense Refill Last Dose  . acetaminophen (TYLENOL) 500 MG tablet Take 500 mg by mouth every 6 (six) hours as needed for moderate pain.    unknown  . albuterol (PROVENTIL HFA;VENTOLIN HFA) 108 (90 BASE) MCG/ACT inhaler Inhale 2 puffs into the lungs every 4 (four) hours as needed for wheezing or shortness of breath.    07/09/2014 at Unknown time  . albuterol (PROVENTIL) (2.5 MG/3ML) 0.083% nebulizer solution Take 2.5 mg by  nebulization 3 (three) times daily.    07/10/2014 at Unknown time  . Amino Acids-Protein Hydrolys (FEEDING SUPPLEMENT, PRO-STAT SUGAR FREE 64,) LIQD Take 30 mLs by mouth 3 (three) times daily with meals.   07/10/2014 at Unknown time  . aspirin EC 81 MG tablet Take 81 mg by mouth every evening.    07/10/2014 at Unknown time  . atorvastatin (LIPITOR) 20 MG tablet Take 1 tablet (20 mg total) by mouth every evening.   07/10/2014 at Unknown time  . carvedilol (COREG) 6.25 MG tablet Take 1 tablet (6.25 mg total) by mouth 2 (two) times daily with a meal.   07/10/2014 at 1800  . cholecalciferol (VITAMIN D) 1000 UNITS tablet Take 2,000 Units by mouth daily.    07/10/2014 at Unknown time  . fenofibrate 160 MG tablet Take 160 mg by mouth every evening.    07/10/2014 at Unknown time  . gabapentin (NEURONTIN) 100 MG capsule Take 200 mg by mouth 2 (two) times daily.   07/10/2014 at Unknown time  . guaiFENesin (MUCINEX) 600 MG 12 hr tablet Take 600 mg by mouth every 12 (twelve) hours.   07/10/2014 at Unknown time  . isosorbide-hydrALAZINE (BIDIL) 20-37.5 MG per tablet Take 1 tablet by mouth 3 (three) times daily.   07/10/2014 at Unknown time  . rivastigmine (EXELON) 4.6 mg/24hr Place 4.6 mg onto the skin daily.   07/10/2014 at Unknown time  . torsemide (DEMADEX) 20 MG tablet Take 40 mg by mouth 2 (two) times daily.    07/10/2014 at Unknown time  . TRAVATAN Z 0.004 % SOLN ophthalmic solution Place 1 drop into the left eye at bedtime.  07/09/2014 at Unknown time  . traZODone (DESYREL) 50 MG tablet Take 50 mg by mouth at bedtime.   07/09/2014 at Unknown time   Scheduled: . albuterol  2.5 mg Nebulization TID  . aspirin  324 mg Oral Once  . aspirin EC  81 mg Oral QPM  . atorvastatin  20 mg Oral QPM  . budesonide  0.25 mg Nebulization BID  . carvedilol  6.25 mg Oral BID WC  . feeding supplement (PRO-STAT SUGAR FREE 64)  30 mL Oral TID WC  . fenofibrate  160 mg Oral QPM  . furosemide  80 mg Intravenous Q12H  .  gabapentin  200 mg Oral BID  . insulin aspart  0-15 Units Subcutaneous TID WC  . insulin aspart  5 Units Subcutaneous TID WC  . isosorbide-hydrALAZINE  1 tablet Oral TID  . latanoprost  1 drop Left Eye QHS  . mupirocin ointment  1 application Nasal BID  . rivastigmine  4.6 mg Transdermal Daily  . tobramycin  2 drop Left Eye 6 times per day  . traZODone  50 mg Oral QHS     ROS:                                                                                                                                       History obtained from unobtainable from patient due to mental status    Blood pressure 107/62, pulse 62, temperature 98.3 F (36.8 C), temperature source Oral, resp. rate 18, height 5\' 7"  (1.702 m), weight 91.9 kg (202 lb 9.6 oz), SpO2 99 %.   Neurologic Examination:                                                                                                      Physical Exam  Constitutional: He appears well-developed and well-nourished.  Eyes: No scleral injection HENT: No OP obstrucion Head: Normocephalic.  Cardiovascular: Normal rate and regular rhythm.  Respiratory: Effort normal and breath sounds normal.  GI: Soft. Bowel sounds are normal. No distension. There is no tenderness.  Skin: WDI  Neuro Exam: Mental Status: Drowsy and awakens to noxious stimuli briskly and then able to follow simple commands.  No verbal output (has had no verbal output since admission per family).  Able to follow  Simple commands.  Cranial Nerves: II: Discs flat bilaterally; blinks to threat bilaterally, pupils equal, round, reactive to light and accommodation III,IV, VI: ptosis not present, extra-ocular motions intact bilaterally V,VII: face symmetric, facial light  touch sensation normal bilaterally VIII: hearing normal bilaterally IX,X: gag reflex present XI: bilateral shoulder shrug XII: midline tongue extension without atrophy or fasciculations  Motor: Moves all extremities  antigravity both spontaneously and purposefully. At rest shows very mild bilateral intrinsic hand and forearm multifocal twitching.  When ars and wrist held out stretched shows negative myoclonus.  Sensory: withdraws to pain bilaterally in UE and LE Deep Tendon Reflexes:  Right: Upper Extremity   Left: Upper extremity   biceps (C-5 to C-6) 2/4   biceps (C-5 to C-6) 2/4 tricep (C7) 2/4    triceps (C7) 2/4 Brachioradialis (C6) 2/4  Brachioradialis (C6) 2/4  Lower Extremity Lower Extremity  quadriceps (L-2 to L-4) 0/4   quadriceps (L-2 to L-4) 0/4 Achilles (S1) 0/4   Achilles (S1) 0/4  Plantars: Mute bilaterally Cerebellar: difficulty following commands for finger to nose but showed no ataxia when reaching out to touch my hand.  Gait: not tested.  CV: pulses palpable throughout    Lab Results: Basic Metabolic Panel:  Recent Labs Lab 07/12/14 0254 07/13/14 0319 07/14/14 0444 07/15/14 0333 07/16/14 0610 07/18/14 0410  NA 139 141 141 144 140 139  K 4.1 4.1 4.2 4.1 4.2 4.6  CL 98 100 99 98 95* 86*  CO2 29 30 33* 35* 35* 39*  GLUCOSE 174* 190* 162* 141* 239* 181*  BUN 56* 62* 71* 78* 85* 105*  CREATININE 1.66* 1.69* 1.77* 1.80* 1.78* 1.98*  CALCIUM 8.5 8.5 8.3* 8.4 8.3* 9.0  MG 2.3  --   --   --   --   --     Liver Function Tests:  Recent Labs Lab 07/17/14 0406  AST 34  ALT 32  ALKPHOS 42  BILITOT 1.6*  PROT 5.8*  ALBUMIN 2.6*   No results for input(s): LIPASE, AMYLASE in the last 168 hours. No results for input(s): AMMONIA in the last 168 hours.  CBC:  Recent Labs Lab 07/18/14 0410  WBC 8.0  HGB 13.7  HCT 40.9  MCV 97.1  PLT 164    Cardiac Enzymes: No results for input(s): CKTOTAL, CKMB, CKMBINDEX, TROPONINI in the last 168 hours.  Lipid Panel: No results for input(s): CHOL, TRIG, HDL, CHOLHDL, VLDL, LDLCALC in the last 168 hours.  CBG:  Recent Labs Lab 07/17/14 1613 07/17/14 2056 07/18/14 0638 07/18/14 1100 07/18/14 1159  GLUCAP 195* 177*  212* 157* 156*    Microbiology: Results for orders placed or performed during the hospital encounter of 06/21/14  MRSA PCR Screening     Status: None   Collection Time: 06/21/14 10:22 AM  Result Value Ref Range Status   MRSA by PCR NEGATIVE NEGATIVE Final    Comment:        The GeneXpert MRSA Assay (FDA approved for NASAL specimens only), is one component of a comprehensive MRSA colonization surveillance program. It is not intended to diagnose MRSA infection nor to guide or monitor treatment for MRSA infections.     Coagulation Studies: No results for input(s): LABPROT, INR in the last 72 hours.  Imaging: Dg Chest Port 1 View  07/17/2014   CLINICAL DATA:  Follow-up pleural effusion.  Subsequent encounter.  EXAM: PORTABLE CHEST - 1 VIEW  COMPARISON:  Chest radiograph performed 07/15/2014  FINDINGS: A relatively large right-sided pleural effusion is again noted, stable from the prior study. Underlying airspace opacification is again noted. Vascular congestion is noted. Findings raise concern for asymmetric pulmonary edema, though pneumonia cannot be excluded. No pneumothorax is seen.  The  cardiomediastinal silhouette is enlarged. No acute osseous abnormalities are identified.  IMPRESSION: 1. Relatively large right-sided pleural effusion is stable from the prior study. Underlying airspace opacification and vascular congestion noted. Findings raise concern for asymmetric pulmonary edema, though pneumonia cannot be excluded. 2. Cardiomegaly.   Electronically Signed   By: Garald Balding M.D.   On: 07/17/2014 01:33       Assessment and plan per attending neurologist  Etta Quill PA-C [facility] [phone]  07/18/2014, 1:46 PM   Assessment/Plan:  80 YO male with acute on chronic respiratory failure and systolic/diastolic HF, HTN, DM. While hospitalized patient developed UE and facial movements concerning for seizure by primary team.  On further examination movements  are likely asterixis.  CT head obtained showed negative for acute intracranial findings. palliative care meeting to be had about patients over all poor prognosis.   Recommend: 1) Treat underlying metabolic etiology for asterixis.  2) will D/C Neurontin as this may contributing with worsening renal function.  3) EEG has been D/C'd  No further recommendations.   Patient seen and evaluated. Agree with above assessment and plan. In brief, 80y/o with multiple medical problems, noted to have abnormal movements concerning for possible seizure activity. On exam movements appear to be consistent with asterixis. Will cancel EEG and d/c neurontin which can lead to asterixis. No further neurological workup indicated at this time.  Jim Like, DO [facility] [phone]  If 7pm- 7am, please page neurology on call as listed in [place].

## 2014-07-19 DIAGNOSIS — G9340 Encephalopathy, unspecified: Secondary | ICD-10-CM | POA: Insufficient documentation

## 2014-07-19 LAB — CBC
HCT: 39.1 % (ref 39.0–52.0)
HEMOGLOBIN: 13.2 g/dL (ref 13.0–17.0)
MCH: 33.9 pg (ref 26.0–34.0)
MCHC: 33.8 g/dL (ref 30.0–36.0)
MCV: 100.5 fL — ABNORMAL HIGH (ref 78.0–100.0)
Platelets: 177 10*3/uL (ref 150–400)
RBC: 3.89 MIL/uL — ABNORMAL LOW (ref 4.22–5.81)
RDW: 15 % (ref 11.5–15.5)
WBC: 8.4 10*3/uL (ref 4.0–10.5)

## 2014-07-19 LAB — GLUCOSE, CAPILLARY
Glucose-Capillary: 120 mg/dL — ABNORMAL HIGH (ref 70–99)
Glucose-Capillary: 108 mg/dL — ABNORMAL HIGH (ref 70–99)
Glucose-Capillary: 178 mg/dL — ABNORMAL HIGH (ref 70–99)
Glucose-Capillary: 171 mg/dL — ABNORMAL HIGH (ref 70–99)

## 2014-07-19 LAB — BASIC METABOLIC PANEL
Anion gap: 11 (ref 5–15)
BUN: 98 mg/dL — ABNORMAL HIGH (ref 6–23)
CO2: 43 mEq/L (ref 19–32)
CREATININE: 1.91 mg/dL — AB (ref 0.50–1.35)
Calcium: 9.4 mg/dL (ref 8.4–10.5)
Chloride: 89 mEq/L — ABNORMAL LOW (ref 96–112)
GFR calc Af Amer: 36 mL/min — ABNORMAL LOW (ref >90)
GFR calc non Af Amer: 31 mL/min — ABNORMAL LOW (ref >90)
Glucose, Bld: 90 mg/dL (ref 70–99)
Potassium: 3.9 mEq/L (ref 3.7–5.3)
Sodium: 143 mEq/L (ref 137–147)

## 2014-07-19 MED ORDER — METOPROLOL SUCCINATE ER 25 MG PO TB24
25.0000 mg | ORAL_TABLET | Freq: Every day | ORAL | Status: DC
  Administered 2014-07-19 – 2014-07-23 (×5): 25 mg via ORAL
  Filled 2014-07-19 (×5): qty 1

## 2014-07-19 NOTE — Plan of Care (Signed)
Problem: Phase II Progression Outcomes Goal: Walk in hall or up in chair TID Outcome: Not Met (add Reason) Client is unable to ambulate, even with assistance at this time.

## 2014-07-19 NOTE — Progress Notes (Addendum)
    Subjective:  More responsive today; denies CP; mild dyspnea  Objective:  Vital Signs in the last 24 hours: Temp:  [98.2 F (36.8 C)-98.8 F (37.1 C)] 98.7 F (37.1 C) (12/05 0400) Pulse Rate:  [62-72] 72 (12/05 0923) Resp:  [18] 18 (12/05 0400) BP: (101-146)/(46-62) 140/62 mmHg (12/05 0923) SpO2:  [96 %-99 %] 96 % (12/05 0400) Weight:  [189 lb 6 oz (85.9 kg)] 189 lb 6 oz (85.9 kg) (12/05 0400)  Intake/Output from previous day:  Intake/Output Summary (Last 24 hours) at 07/19/14 0953 Last data filed at 07/19/14 0865  Gross per 24 hour  Intake    760 ml  Output   8795 ml  Net  -8035 ml    Physical Exam: General appearance: chronically ill appearing HEENT poor dentition Neck supple Lungs: clear on Lt, decreased breath sounds 2/3 up on Rt Heart: regular rate and rhythm Abd: soft Extremities: no edema   Lab Results:  Recent Labs  07/18/14 0410 07/19/14 0609  WBC 8.0 8.4  HGB 13.7 13.2  PLT 164 177    Recent Labs  07/18/14 1449 07/19/14 0609  NA 141 143  K 4.0 3.9  CL 89* 89*  CO2 44* 43*  GLUCOSE 89 90  BUN 100* 98*  CREATININE 1.96* 1.91*    Cardiac Studies:  Assessment/Plan:  80 yo with history of interstitial lung disease, Rt and Lt heart failure, and recurrent Rt pleural effusion. The pt is demented and lives in a SNF. He was admitted 07/10/14 from NH with acute on chronic respiratory failure. He had just been in the hospital and discharged 06/24/14 after a CHF exacerbation. CXR on 07/15/14 showed worsening Rt effusion. Pulmonary evaluated and felt thoracentesis not indicated.   Principal Problem:   Acute on chronic respiratory failure Active Problems:   CKD (chronic kidney disease), stage III    Recurrent Rt pleural effussion   COPD (chronic obstructive pulmonary disease)   Acute on chronic combined systolic and diastolic congestive heart failure   Dementia-SNF pt   Hyperlipidemia   Essential hypertension   Type 2 diabetes mellitus with  autonomic neuropathy   Cardiomyopathy-EF 40-45%   CHF exacerbation   PLAN:  1 acute on chronic combined systolic/diastolic congestive heart failure-Patient's renal function is unchanged. Thoracentesis is not planned for right effusion per pulmonary. Continue Lasix at present dose. Change coreg to toprol 25 mg daily. I continue to think his prognosis is extremely poor. Palliative care consult to be performed. 2 Stage III chronic renal failure-follow renal function closely on lower dose lasix. 3 COPD-management per pulmonary. 4 dementia 5 right pleural effusion-felt secondary to congestive heart failure. Pulmonary does not plan thoracentesis. Kirk Ruths

## 2014-07-19 NOTE — Progress Notes (Signed)
Patient ID: [name] [name]  male  PTW:[id number]    DOB: Sep 25, 1930    DOA: 07/10/2014  PCP: [name], NP  Brief history of present illness Patient is a 80 year old male with systolic and diastolic CHF, COPD, CAD stage III, diabetes type 2, dementia presented from skilled nursing facility with shortness of breath for at least one day prior to admission. Apparently, patient's family had taken the patient home for Thanksgiving lunch, after he returned he was more short of breath. Patient was found to have O2 sats of 90% on room air term, chest x-ray showed right pleural effusion, BNP 8042, patient was started on IV diuresis. He was recently discharged from the hospital on 06/24/14 with CHF exacerbation.  Assessment/Plan: Principal Problem:   Acute on chronic respiratory failure likely due to acute on chronic systolic and diastolic CHF exacerbation, possibly worsened due to dietary indiscretion and COPD exacerbation, right-sided pleural effusion.  - 2-D echo on 03/07/14 had shown EF 70-01%, grade 1 diastolic dysfunction. Nuclear medicine stress test on 7/26 showed no reversible ischemia, EF 42% -Cardiology following, decreased to Lasix yesterday to 80 mg BID, CO2 is trending down from yesterday. Long-term prognosis poor, recommended palliative care consult  - Palliative consult placed, reading goals of care  Active Problems: Acute encephalopathy, asterixis - CT head negative for any acute intracranial pathology, ABG showed alkalosis likely metabolic alkalosis from diuresis, Lasix decreased - Patient was evaluated by neurology, did not feel he had any seizures, asterixis probably due to underlying metabolic abnormalities. EEG canceled.  COPD exacerbation - IV Solu-Medrol tapered, transitioned to oral prednisone,continue bronchodilators, O2  Right-sided pleural effusion- recurrent - Pulmonary was consulted, Dr Alva Garnet did not recommend thoracentesis due to prior findings of transudate and  recommended optimize adequate therapy for CHF discontinue steroids, changed to metoprolol or no beta blockers. PCCM Signed off     CKD (chronic kidney disease), stage III - Cr 1.9 , stable  Urinary retention: With hematuria, ? Traumatic - foley cath placed by urology, Dr Jeffie Pollock 11/30.  - DC'd heparin subcutaneous, placed on SCDs   Diabetes mellitus - continue sliding scale insulin - Hemoglobin A1c 5.7  Dementia Continue rivastigmine patch  Hyperlipidemia -  continue statin and fenofibrate  Hypertension Currently stable  Bilateral lower extremities stasis ulcers - Continue dressing changes   DVT Prophylaxis:  Code Status: DO NOT RESUSCITATE  Family Communication: I have discussed in detail with patient's son and multiple family members at the bedside yesterday  Disposition: Possible LTAC candidate? Palliative medicine goals of care pending  Consultants:  Cardiology   Urology   pulmonology  Palliative medicine  Procedures:  Foley catheter placement  Antibiotics:  none    Subjective:  Still lethargic however responsive to painful stimuli, no twitching noticed, no acute issues overnight   Objective: Weight change: -6 kg (-13 lb 3.6 oz)  Intake/Output Summary (Last 24 hours) at 07/19/14 1010 Last data filed at 07/19/14 0929  Gross per 24 hour  Intake    760 ml  Output   7570 ml  Net  -6810 ml   Blood pressure 140/62, pulse 72, temperature 98.7 F (37.1 C), temperature source Oral, resp. rate 18, height 5\' 7"  (1.702 m), weight 85.9 kg (189 lb 6 oz), SpO2 96 %.  Physical Exam: General: Somewhat lethargic, confused but responsive to painful stimuli CVS: S1-S2 clear Chest: Dec BS at bases Abdomen: soft NT, ND, NBS  Extremities: no c/c, + edema    Lab Results: Basic Metabolic Panel:  Recent Labs Lab 07/18/14 1449 07/19/14 0609  NA 141 143  K 4.0 3.9  CL 89* 89*  CO2 44* 43*  GLUCOSE 89 90  BUN 100* 98*  CREATININE 1.96* 1.91*  CALCIUM  9.3 9.4  MG 2.4  --    Liver Function Tests:  Recent Labs Lab 07/17/14 0406  AST 34  ALT 32  ALKPHOS 42  BILITOT 1.6*  PROT 5.8*  ALBUMIN 2.6*   No results for input(s): LIPASE, AMYLASE in the last 168 hours.  Recent Labs Lab 07/18/14 1449  AMMONIA 63*   CBC:  Recent Labs Lab 07/18/14 0410 07/19/14 0609  WBC 8.0 8.4  HGB 13.7 13.2  HCT 40.9 39.1  MCV 97.1 100.5*  PLT 164 177   Cardiac Enzymes: No results for input(s): CKTOTAL, CKMB, CKMBINDEX, TROPONINI in the last 168 hours. BNP: Invalid input(s): POCBNP CBG:  Recent Labs Lab 07/18/14 1100 07/18/14 1159 07/18/14 1709 07/18/14 2101 07/19/14 0631  GLUCAP 157* 156* 216* 160* 120*     Micro Results: No results found for this or any previous visit (from the past 240 hour(s)).  Studies/Results: Ct Head Wo Contrast  07/18/2014   CLINICAL DATA:  Lethargy, not responding to verbal commands, some jerking movements of face and RIGHT arm, acute encephalopathy, earlier in day was alert  EXAM: CT HEAD WITHOUT CONTRAST  TECHNIQUE: Contiguous axial images were obtained from the base of the skull through the vertex without intravenous contrast.  COMPARISON:  01/09/2014  FINDINGS: Generalized atrophy.  Normal ventricular morphology.  No midline shift or mass effect.  Small vessel chronic ischemic changes of deep cerebral white matter.  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  No intracranial hemorrhage, mass lesion, or evidence acute infarction.  No extra-axial fluid collections.  Atherosclerotic calcification at the carotid siphons bilaterally.  No definite focal bone or sinus abnormality.  IMPRESSION: Atrophy with small vessel chronic ischemic changes of deep cerebral white matter.  Small remote LEFT hemispheric cortical infarct at vertex.  No acute intracranial abnormalities.   Electronically Signed   By: Lavonia Dana M.D.   On: 07/18/2014 13:45   Dg Chest Port 1 View  07/17/2014   CLINICAL DATA:  Follow-up pleural  effusion.  Subsequent encounter.  EXAM: PORTABLE CHEST - 1 VIEW  COMPARISON:  Chest radiograph performed 07/15/2014  FINDINGS: A relatively large right-sided pleural effusion is again noted, stable from the prior study. Underlying airspace opacification is again noted. Vascular congestion is noted. Findings raise concern for asymmetric pulmonary edema, though pneumonia cannot be excluded. No pneumothorax is seen.  The cardiomediastinal silhouette is enlarged. No acute osseous abnormalities are identified.  IMPRESSION: 1. Relatively large right-sided pleural effusion is stable from the prior study. Underlying airspace opacification and vascular congestion noted. Findings raise concern for asymmetric pulmonary edema, though pneumonia cannot be excluded. 2. Cardiomegaly.   Electronically Signed   By: Garald Balding M.D.   On: 07/17/2014 01:33   Dg Chest Port 1 View  07/15/2014   CLINICAL DATA:  Shortness of breath.  CHF.  EXAM: PORTABLE CHEST - 1 VIEW  COMPARISON:  07/10/2014, 07/01/2014, 12/31/2013.  FINDINGS: Cardiomegaly. Pulmonary vascular prominence noted on today's exam. Progressive right-sided pleural effusion noted. Increased density of the upper medial right chest may represent acute remain pleural fluid medially, this is progressed from prior exam. Atelectasis right lower lobe. Pulmonary interstitial prominence. Congestive heart failure is most likely present. No pneumothorax.  IMPRESSION: 1. Progressive right pleural effusion. 2. Cardiomegaly. Pulmonary vascular prominence noted  on today's exam. Progressive interstitial prominence noted. These findings are consistent with congestive heart failure. 3. Right lower lobe atelectasis.   Electronically Signed   By: Marcello Moores  Register   On: 07/15/2014 07:13   Dg Chest Port 1 View  07/10/2014   CLINICAL DATA:  Dyspnea  EXAM: PORTABLE CHEST - 1 VIEW  COMPARISON:  June 21, 2014  FINDINGS: There is a sizable pleural effusion on the right with consolidation  involving the right middle and lower lobes. Left lung is clear. Heart is enlarged with pulmonary vascularity within normal limits. No adenopathy appreciable.  IMPRESSION: Persistent right effusion with consolidation involving essentially all of the right middle and lower lobes. Left lung clear. No change in cardiac silhouette.   Electronically Signed   By: Lowella Grip M.D.   On: 07/10/2014 21:25   Dg Chest Portable 1 View  06/21/2014   CLINICAL DATA:  Shortness of breath.  Bilateral leg swelling.  EXAM: PORTABLE CHEST - 1 VIEW  COMPARISON:  04/05/2014  FINDINGS: Shallow inspiration. Cardiac enlargement with pulmonary vascular congestion and perihilar edema. Moderate size right pleural effusion with basilar atelectasis. Changes are similar to those seen on previous study. No pneumothorax. Calcification of aorta.  IMPRESSION: Cardiac enlargement with pulmonary vascular congestion and perihilar edema. Right pleural effusion with basilar atelectasis.   Electronically Signed   By: Lucienne Capers M.D.   On: 06/21/2014 03:06    Medications: Scheduled Meds: . albuterol  2.5 mg Nebulization TID  . aspirin  324 mg Oral Once  . aspirin EC  81 mg Oral QPM  . atorvastatin  20 mg Oral QPM  . budesonide  0.25 mg Nebulization BID  . feeding supplement (PRO-STAT SUGAR FREE 64)  30 mL Oral TID WC  . fenofibrate  160 mg Oral QPM  . furosemide  80 mg Intravenous Q12H  . insulin aspart  0-15 Units Subcutaneous TID WC  . insulin aspart  5 Units Subcutaneous TID WC  . isosorbide-hydrALAZINE  1 tablet Oral TID  . latanoprost  1 drop Left Eye QHS  . metoprolol succinate  25 mg Oral Daily  . mupirocin ointment  1 application Nasal BID  . rivastigmine  4.6 mg Transdermal Daily  . tobramycin  2 drop Left Eye 6 times per day      LOS: 9 days   Shantai Tiedeman M.D. [facility] 07/19/2014, 10:10 AM Pager: [phone]  If 7PM-7AM, please contact night-coverage [email or web address] Password [email or web address]

## 2014-07-20 DIAGNOSIS — I5043 Acute on chronic combined systolic (congestive) and diastolic (congestive) heart failure: Principal | ICD-10-CM

## 2014-07-20 DIAGNOSIS — G9340 Encephalopathy, unspecified: Secondary | ICD-10-CM

## 2014-07-20 DIAGNOSIS — N183 Chronic kidney disease, stage 3 (moderate): Secondary | ICD-10-CM

## 2014-07-20 DIAGNOSIS — J9620 Acute and chronic respiratory failure, unspecified whether with hypoxia or hypercapnia: Secondary | ICD-10-CM

## 2014-07-20 DIAGNOSIS — I429 Cardiomyopathy, unspecified: Secondary | ICD-10-CM

## 2014-07-20 DIAGNOSIS — J90 Pleural effusion, not elsewhere classified: Secondary | ICD-10-CM

## 2014-07-20 DIAGNOSIS — J441 Chronic obstructive pulmonary disease with (acute) exacerbation: Secondary | ICD-10-CM

## 2014-07-20 LAB — GLUCOSE, CAPILLARY
GLUCOSE-CAPILLARY: 201 mg/dL — AB (ref 70–99)
GLUCOSE-CAPILLARY: 127 mg/dL — AB (ref 70–99)
GLUCOSE-CAPILLARY: 44 mg/dL — AB (ref 70–99)
GLUCOSE-CAPILLARY: 47 mg/dL — AB (ref 70–99)
GLUCOSE-CAPILLARY: 90 mg/dL (ref 70–99)
Glucose-Capillary: 200 mg/dL — ABNORMAL HIGH (ref 70–99)
Glucose-Capillary: 189 mg/dL — ABNORMAL HIGH (ref 70–99)
Glucose-Capillary: 89 mg/dL (ref 70–99)

## 2014-07-20 LAB — CBC
HEMATOCRIT: 38.1 % — AB (ref 39.0–52.0)
Hemoglobin: 12.8 g/dL — ABNORMAL LOW (ref 13.0–17.0)
MCH: 32.5 pg (ref 26.0–34.0)
MCHC: 33.6 g/dL (ref 30.0–36.0)
MCV: 96.7 fL (ref 78.0–100.0)
PLATELETS: 166 10*3/uL (ref 150–400)
RBC: 3.94 MIL/uL — ABNORMAL LOW (ref 4.22–5.81)
RDW: 14.6 % (ref 11.5–15.5)
WBC: 7.6 10*3/uL (ref 4.0–10.5)

## 2014-07-20 LAB — BASIC METABOLIC PANEL
ANION GAP: 11 (ref 5–15)
BUN: 100 mg/dL — ABNORMAL HIGH (ref 6–23)
CO2: 39 meq/L — AB (ref 19–32)
Calcium: 9.1 mg/dL (ref 8.4–10.5)
Chloride: 89 mEq/L — ABNORMAL LOW (ref 96–112)
Creatinine, Ser: 2.06 mg/dL — ABNORMAL HIGH (ref 0.50–1.35)
GFR calc Af Amer: 33 mL/min — ABNORMAL LOW (ref >90)
GFR calc non Af Amer: 29 mL/min — ABNORMAL LOW (ref >90)
GLUCOSE: 200 mg/dL — AB (ref 70–99)
POTASSIUM: 3.4 meq/L — AB (ref 3.7–5.3)
SODIUM: 139 meq/L (ref 137–147)

## 2014-07-20 MED ORDER — INSULIN GLARGINE 100 UNIT/ML ~~LOC~~ SOLN
5.0000 [IU] | Freq: Every day | SUBCUTANEOUS | Status: DC
  Administered 2014-07-20 – 2014-07-21 (×2): 5 [IU] via SUBCUTANEOUS
  Filled 2014-07-20 (×2): qty 0.05

## 2014-07-20 MED ORDER — POTASSIUM CHLORIDE CRYS ER 20 MEQ PO TBCR
40.0000 meq | EXTENDED_RELEASE_TABLET | Freq: Once | ORAL | Status: AC
  Administered 2014-07-20: 40 meq via ORAL
  Filled 2014-07-20: qty 2

## 2014-07-20 NOTE — Clinical Social Work Psychosocial (Addendum)
Clinical Social Work Department BRIEF PSYCHOSOCIAL ASSESSMENT 07/11/2014  Patient:  [name] [name], [name] [name]     Account Number:  [id number]     Admit date:  07/10/2014  Clinical Social Worker:  Elam Dutch  Date/Time:  07/11/2014 04:35 PM  Referred by:  Physician  Date Referred:  07/11/2014 Referred for  Other - See comment   Other Referral:   Return to SNF   Interview type:  Patient Other interview type:   Briefly with patient who is oriented to person only. Message left for contact person- [name] [name] to return call.    PSYCHOSOCIAL DATA Living Status:  FACILITY Admitted from facility:  [facility] Level of care:  [place] Primary support name:  [name] [name]   [phone] Primary support relationship to patient:  FRIEND Degree of support available:   Unknown    CURRENT CONCERNS Current Concerns  Other - See comment   Other Concerns:   Return to [place] / PLAN 80 year old male- resident of [facility] where he requires SNF level of care. CSW spoke to Gayla Medicus, Database administrator for [facility]. She stated that patient was well maangged at the facility and that they would accept patient back when medically stable.  CSW met with patient- he is alert and oriented to person only.  Message left for contact person- [name] [name] for call back to determine if ok for return to [facility]. Fl2 initiated and placed on chart for MD's signature.   Assessment/plan status:  Psychosocial Support/Ongoing Assessment of Needs Other assessment/ plan:   Information/referral to community resources:   None at this time    PATIENT'S/FAMILY'S RESPONSE TO PLAN OF CARE: Patient is alert and oriented to person only and unable to respond well to plan of care. Stated that he did want to return to facility when stable but doubtful he fully understood CSW.  Will need to discuss further with contact person once able to speak with her.  Patient has a son  listed- [name] [name]. CSW left message for son to return call as well.

## 2014-07-20 NOTE — Progress Notes (Signed)
Will be available as needed over the weekend. Palliative care discussion and goals of care assessment are appropriate. Continue current dose lasix.  Pixie Casino, MD, [facility] Attending Cardiologist [place]

## 2014-07-20 NOTE — Plan of Care (Signed)
Problem: Phase I Progression Outcomes Goal: EF % per last Echo/documented,Core Reminder form on chart Outcome: Completed/Met Date Met:  07/20/14 EF40-45%(03-07-14)  Problem: Phase II Progression Outcomes Goal: Tolerating diet Outcome: Completed/Met Date Met:  07/20/14

## 2014-07-20 NOTE — Progress Notes (Signed)
CRITICAL VALUE ALERT  Critical value received:  CO2  Date of notification:  07/19/14  Time of notification:  0821  Critical value read back: Yes   Nurse who received alert: Kandis Cocking  MD notified (1st page):  Rai  Time of first page:  [phone]  MD notified (2nd page): Rai  Time of second [id number]  Responding MD: Tana Coast  Time MD responded: [phone]

## 2014-07-20 NOTE — Plan of Care (Signed)
Problem: Phase II Progression Outcomes Goal: Fluid volume status improved Outcome: Completed/Met Date Met:  07/20/14

## 2014-07-20 NOTE — Progress Notes (Addendum)
Patient ID: [name] [name]  male  FFM:[id number]    DOB: 04/23/1931    DOA: 07/10/2014  PCP: [name], NP  Brief history of present illness Patient is a 80 year old male with systolic and diastolic CHF, COPD, CAD stage III, diabetes type 2, dementia presented from skilled nursing facility with shortness of breath for at least one day prior to admission. Apparently, patient's family had taken the patient home for Thanksgiving lunch, after he returned he was more short of breath. Patient was found to have O2 sats of 90% on room air term, chest x-ray showed right pleural effusion, BNP 8042, patient was started on IV diuresis. He was recently discharged from the hospital on 06/24/14 with CHF exacerbation.  Assessment/Plan: Principal Problem:   Acute on chronic respiratory failure likely due to acute on chronic systolic and diastolic CHF exacerbation, possibly worsened due to dietary indiscretion and COPD exacerbation, right-sided pleural effusion.  - 2-D echo on 03/07/14 had shown EF 57-01%, grade 1 diastolic dysfunction. Nuclear medicine stress test on 7/26 showed no reversible ischemia, EF 42% -Cardiology following, decreased Lasix to 80 mg BID, CO2 39, trending down. Long-term prognosis poor - Palliative consult placed for goals of care  Active Problems: Acute encephalopathy, asterixis: Significantly resolved, patient is alert oriented 2 today, conversant - CT head negative for any acute intracranial pathology, ABG showed alkalosis likely metabolic alkalosis from diuresis, Lasix decreased - Patient was evaluated by neurology, did not feel he had any seizures, asterixis probably due to underlying metabolic abnormalities. EEG canceled.  COPD exacerbation -Resolved, continue Pulmicort, bronchodilators  Right-sided pleural effusion- recurrent - Pulmonary was consulted, Dr Alva Garnet did not recommend thoracentesis due to prior findings of transudate and recommended optimize adequate therapy  for CHF, discontinue steroids, change to metoprolol or no beta blockers. PCCM Signed off     CKD (chronic kidney disease), stage III - Cr 2.0 , slightly trended up today, monitor closely stable  Hypokalemia due to diuresis - Replaced  Urinary retention: With hematuria, ? Traumatic - foley cath placed by urology, Dr Jeffie Pollock 11/30.  - DC'd heparin subcutaneous, placed on SCDs   Diabetes mellitus: Uncontrolled - continue sliding scale insulin - Hemoglobin A1c 5.7 - Added Lantus  Dementia Continue rivastigmine patch  Hyperlipidemia -  continue statin and fenofibrate  Hypertension Currently stable  Bilateral lower extremities stasis ulcers - Continue dressing changes   DVT Prophylaxis: SCD's  Code Status: DO NOT RESUSCITATE  Family Communication: Discussed in detail with patient's son, [name] [name] on the phone, he is now agreeable to meet palliative medicine for goals of care. Explained to the patient's son that even though Mr. Pavek is looking somewhat better today, overall long-term prognosis is still poor, may benefit from hospice.  Disposition:   Consultants:  Cardiology   Urology   pulmonology  Palliative medicine  Procedures:  Foley catheter placement  Antibiotics:  none    Subjective:  Much more alert and conversant today, alert and oriented 2,that he is in [facility] for "treatment", denies any pain, nausea or vomiting   Objective: Weight change: -4.3 kg (-9 lb 7.7 oz)  Intake/Output Summary (Last 24 hours) at 07/20/14 0925 Last data filed at 07/20/14 0600  Gross per 24 hour  Intake   1600 ml  Output   5475 ml  Net  -3875 ml   Blood pressure 166/51, pulse 71, temperature 98.2 F (36.8 C), temperature source Oral, resp. rate 20, height 5\' 7"  (1.702 m), weight 81.6 kg (179 lb  14.3 oz), SpO2 90 %.  Physical Exam: General: Alert and oriented 2, conversant CVS: S1-S2 clear Chest: Dec BS at bases Abdomen: soft NT, ND, NBS   Extremities: no c/c, + edema    Lab Results: Basic Metabolic Panel:  Recent Labs Lab 07/18/14 1449 07/19/14 0609 07/20/14 0454  NA 141 143 139  K 4.0 3.9 3.4*  CL 89* 89* 89*  CO2 44* 43* 39*  GLUCOSE 89 90 200*  BUN 100* 98* 100*  CREATININE 1.96* 1.91* 2.06*  CALCIUM 9.3 9.4 9.1  MG 2.4  --   --    Liver Function Tests:  Recent Labs Lab 07/17/14 0406  AST 34  ALT 32  ALKPHOS 42  BILITOT 1.6*  PROT 5.8*  ALBUMIN 2.6*   No results for input(s): LIPASE, AMYLASE in the last 168 hours.  Recent Labs Lab 07/18/14 1449  AMMONIA 63*   CBC:  Recent Labs Lab 07/19/14 0609 07/20/14 0454  WBC 8.4 7.6  HGB 13.2 12.8*  HCT 39.1 38.1*  MCV 100.5* 96.7  PLT 177 166   Cardiac Enzymes: No results for input(s): CKTOTAL, CKMB, CKMBINDEX, TROPONINI in the last 168 hours. BNP: Invalid input(s): POCBNP CBG:  Recent Labs Lab 07/19/14 1158 07/19/14 1607 07/19/14 2138 07/20/14 0617 07/20/14 0702  GLUCAP 108* 178* 171* 200* 189*     Micro Results: No results found for this or any previous visit (from the past 240 hour(s)).  Studies/Results: Ct Head Wo Contrast  07/18/2014   CLINICAL DATA:  Lethargy, not responding to verbal commands, some jerking movements of face and RIGHT arm, acute encephalopathy, earlier in day was alert  EXAM: CT HEAD WITHOUT CONTRAST  TECHNIQUE: Contiguous axial images were obtained from the base of the skull through the vertex without intravenous contrast.  COMPARISON:  01/09/2014  FINDINGS: Generalized atrophy.  Normal ventricular morphology.  No midline shift or mass effect.  Small vessel chronic ischemic changes of deep cerebral white matter.  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  No intracranial hemorrhage, mass lesion, or evidence acute infarction.  No extra-axial fluid collections.  Atherosclerotic calcification at the carotid siphons bilaterally.  No definite focal bone or sinus abnormality.  IMPRESSION: Atrophy with small vessel  chronic ischemic changes of deep cerebral white matter.  Small remote LEFT hemispheric cortical infarct at vertex.  No acute intracranial abnormalities.   Electronically Signed   By: Lavonia Dana M.D.   On: 07/18/2014 13:45   Dg Chest Port 1 View  07/17/2014   CLINICAL DATA:  Follow-up pleural effusion.  Subsequent encounter.  EXAM: PORTABLE CHEST - 1 VIEW  COMPARISON:  Chest radiograph performed 07/15/2014  FINDINGS: A relatively large right-sided pleural effusion is again noted, stable from the prior study. Underlying airspace opacification is again noted. Vascular congestion is noted. Findings raise concern for asymmetric pulmonary edema, though pneumonia cannot be excluded. No pneumothorax is seen.  The cardiomediastinal silhouette is enlarged. No acute osseous abnormalities are identified.  IMPRESSION: 1. Relatively large right-sided pleural effusion is stable from the prior study. Underlying airspace opacification and vascular congestion noted. Findings raise concern for asymmetric pulmonary edema, though pneumonia cannot be excluded. 2. Cardiomegaly.   Electronically Signed   By: Garald Balding M.D.   On: 07/17/2014 01:33   Dg Chest Port 1 View  07/15/2014   CLINICAL DATA:  Shortness of breath.  CHF.  EXAM: PORTABLE CHEST - 1 VIEW  COMPARISON:  07/10/2014, 07/01/2014, 12/31/2013.  FINDINGS: Cardiomegaly. Pulmonary vascular prominence noted on today's exam.  Progressive right-sided pleural effusion noted. Increased density of the upper medial right chest may represent acute remain pleural fluid medially, this is progressed from prior exam. Atelectasis right lower lobe. Pulmonary interstitial prominence. Congestive heart failure is most likely present. No pneumothorax.  IMPRESSION: 1. Progressive right pleural effusion. 2. Cardiomegaly. Pulmonary vascular prominence noted on today's exam. Progressive interstitial prominence noted. These findings are consistent with congestive heart failure. 3. Right lower  lobe atelectasis.   Electronically Signed   By: Marcello Moores  Register   On: 07/15/2014 07:13   Dg Chest Port 1 View  07/10/2014   CLINICAL DATA:  Dyspnea  EXAM: PORTABLE CHEST - 1 VIEW  COMPARISON:  June 21, 2014  FINDINGS: There is a sizable pleural effusion on the right with consolidation involving the right middle and lower lobes. Left lung is clear. Heart is enlarged with pulmonary vascularity within normal limits. No adenopathy appreciable.  IMPRESSION: Persistent right effusion with consolidation involving essentially all of the right middle and lower lobes. Left lung clear. No change in cardiac silhouette.   Electronically Signed   By: Lowella Grip M.D.   On: 07/10/2014 21:25   Dg Chest Portable 1 View  06/21/2014   CLINICAL DATA:  Shortness of breath.  Bilateral leg swelling.  EXAM: PORTABLE CHEST - 1 VIEW  COMPARISON:  04/05/2014  FINDINGS: Shallow inspiration. Cardiac enlargement with pulmonary vascular congestion and perihilar edema. Moderate size right pleural effusion with basilar atelectasis. Changes are similar to those seen on previous study. No pneumothorax. Calcification of aorta.  IMPRESSION: Cardiac enlargement with pulmonary vascular congestion and perihilar edema. Right pleural effusion with basilar atelectasis.   Electronically Signed   By: Lucienne Capers M.D.   On: 06/21/2014 03:06    Medications: Scheduled Meds: . albuterol  2.5 mg Nebulization TID  . aspirin  324 mg Oral Once  . aspirin EC  81 mg Oral QPM  . atorvastatin  20 mg Oral QPM  . budesonide  0.25 mg Nebulization BID  . feeding supplement (PRO-STAT SUGAR FREE 64)  30 mL Oral TID WC  . fenofibrate  160 mg Oral QPM  . furosemide  80 mg Intravenous Q12H  . insulin aspart  0-15 Units Subcutaneous TID WC  . insulin aspart  5 Units Subcutaneous TID WC  . isosorbide-hydrALAZINE  1 tablet Oral TID  . latanoprost  1 drop Left Eye QHS  . metoprolol succinate  25 mg Oral Daily  . mupirocin ointment  1 application  Nasal BID  . rivastigmine  4.6 mg Transdermal Daily  . tobramycin  2 drop Left Eye 6 times per day      LOS: 10 days   Salam Micucci M.D. [facility] 07/20/2014, 9:25 AM Pager: [phone]  If 7PM-7AM, please contact night-coverage [email or web address] Password [email or web address]

## 2014-07-20 NOTE — Plan of Care (Signed)
Problem: Phase I Progression Outcomes Goal: Up in chair, BRP Outcome: Progressing Tolerated being up in the chair.  Client is more aware of surroundings when up in chair and can assist with feeding himself.

## 2014-07-21 DIAGNOSIS — E785 Hyperlipidemia, unspecified: Secondary | ICD-10-CM

## 2014-07-21 LAB — BASIC METABOLIC PANEL
Anion gap: 11 (ref 5–15)
BUN: 96 mg/dL — AB (ref 6–23)
CHLORIDE: 89 meq/L — AB (ref 96–112)
CO2: 39 meq/L — AB (ref 19–32)
CREATININE: 1.95 mg/dL — AB (ref 0.50–1.35)
Calcium: 8.9 mg/dL (ref 8.4–10.5)
GFR calc Af Amer: 35 mL/min — ABNORMAL LOW (ref >90)
GFR calc non Af Amer: 31 mL/min — ABNORMAL LOW (ref >90)
GLUCOSE: 159 mg/dL — AB (ref 70–99)
POTASSIUM: 3.4 meq/L — AB (ref 3.7–5.3)
Sodium: 139 mEq/L (ref 137–147)

## 2014-07-21 LAB — GLUCOSE, CAPILLARY
GLUCOSE-CAPILLARY: 241 mg/dL — AB (ref 70–99)
Glucose-Capillary: 195 mg/dL — ABNORMAL HIGH (ref 70–99)
Glucose-Capillary: 143 mg/dL — ABNORMAL HIGH (ref 70–99)
Glucose-Capillary: 62 mg/dL — ABNORMAL LOW (ref 70–99)
Glucose-Capillary: 80 mg/dL (ref 70–99)

## 2014-07-21 LAB — PRO B NATRIURETIC PEPTIDE: Pro B Natriuretic peptide (BNP): 4458 pg/mL — ABNORMAL HIGH (ref 0–450)

## 2014-07-21 MED ORDER — INSULIN GLARGINE 100 UNIT/ML ~~LOC~~ SOLN
5.0000 [IU] | Freq: Every day | SUBCUTANEOUS | Status: DC
  Filled 2014-07-21: qty 0.05

## 2014-07-21 MED ORDER — INSULIN ASPART 100 UNIT/ML ~~LOC~~ SOLN: 3.0000 [IU] | Freq: Three times a day (TID) | SUBCUTANEOUS | Status: DC

## 2014-07-21 MED ORDER — INSULIN ASPART 100 UNIT/ML ~~LOC~~ SOLN: 0.0000 [IU] | Freq: Every day | SUBCUTANEOUS | Status: DC

## 2014-07-21 MED ORDER — FUROSEMIDE 80 MG PO TABS
80.0000 mg | ORAL_TABLET | Freq: Two times a day (BID) | ORAL | Status: DC
  Administered 2014-07-22 – 2014-07-23 (×4): 80 mg via ORAL
  Filled 2014-07-21 (×5): qty 1

## 2014-07-21 MED ORDER — INSULIN ASPART 100 UNIT/ML ~~LOC~~ SOLN: 0.0000 [IU] | Freq: Three times a day (TID) | SUBCUTANEOUS | Status: DC

## 2014-07-21 MED ORDER — INSULIN GLARGINE 100 UNIT/ML ~~LOC~~ SOLN: 10.0000 [IU] | Freq: Every day | SUBCUTANEOUS | Status: DC

## 2014-07-21 NOTE — Progress Notes (Addendum)
Patient Name: [name] [name] Date of Encounter: 07/21/2014     Principal Problem:   Acute on chronic respiratory failure Active Problems:   CKD (chronic kidney disease), stage III    Recurrent Rt pleural effussion   COPD (chronic obstructive pulmonary disease)   Acute on chronic combined systolic and diastolic congestive heart failure   Dementia-SNF pt   Hyperlipidemia   Essential hypertension   Type 2 diabetes mellitus with autonomic neuropathy   Cardiomyopathy-EF 40-45%   CHF exacerbation   Acute encephalopathy    SUBJECTIVE  Sleepy this morning. Did not wake up for me. Overnight hypoglycemic episode noted.   CURRENT MEDS . albuterol  2.5 mg Nebulization TID  . aspirin  324 mg Oral Once  . aspirin EC  81 mg Oral QPM  . atorvastatin  20 mg Oral QPM  . budesonide  0.25 mg Nebulization BID  . feeding supplement (PRO-STAT SUGAR FREE 64)  30 mL Oral TID WC  . fenofibrate  160 mg Oral QPM  . furosemide  80 mg Intravenous Q12H  . insulin aspart  0-15 Units Subcutaneous TID WC  . insulin aspart  5 Units Subcutaneous TID WC  . insulin glargine  5 Units Subcutaneous Daily  . isosorbide-hydrALAZINE  1 tablet Oral TID  . latanoprost  1 drop Left Eye QHS  . metoprolol succinate  25 mg Oral Daily  . mupirocin ointment  1 application Nasal BID  . rivastigmine  4.6 mg Transdermal Daily  . tobramycin  2 drop Left Eye 6 times per day    OBJECTIVE  Filed Vitals:   07/20/14 1754 07/20/14 2129 07/20/14 2203 07/21/14 0613  BP: 126/90  116/75 124/79  Pulse: 65  69 70  Temp:   98.5 F (36.9 C) 98.2 F (36.8 C)  TempSrc:   Oral Oral  Resp: 18  18 18   Height:      Weight:    176 lb 6.4 oz (80.015 kg)  SpO2: 94% 94% 98% 97%    Intake/Output Summary (Last 24 hours) at 07/21/14 0821 Last data filed at 07/21/14 0749  Gross per 24 hour  Intake   1700 ml  Output   5510 ml  Net  -3810 ml   Filed Weights   07/19/14 0400 07/20/14 0512 07/21/14 0613  Weight: 189 lb 6 oz (85.9  kg) 179 lb 14.3 oz (81.6 kg) 176 lb 6.4 oz (80.015 kg)    PHYSICAL EXAM  General: unable to arouse this morning Neuro: sleepy, difficult to arouse, however does grunt and move all extremity HEENT:  Normal  Neck: Supple without bruits or JVD. Lungs:  Resp regular and unlabored, CTA except decreased breath sound in R lower lobe Heart: RRR no s3, s4. 1/6 systolic murmur Abdomen: Soft, non-distended, BS + x 4.  Extremities: No clubbing, cyanosis or edema. DP/PT/Radials 2+ and equal bilaterally.  Accessory Clinical Findings  CBC  Recent Labs  07/19/14 0609 07/20/14 0454  WBC 8.4 7.6  HGB 13.2 12.8*  HCT 39.1 38.1*  MCV 100.5* 96.7  PLT 177 676   Basic Metabolic Panel  Recent Labs  07/18/14 1449 07/19/14 0609 07/20/14 0454  NA 141 143 139  K 4.0 3.9 3.4*  CL 89* 89* 89*  CO2 44* 43* 39*  GLUCOSE 89 90 200*  BUN 100* 98* 100*  CREATININE 1.96* 1.91* 2.06*  CALCIUM 9.3 9.4 9.1  MG 2.4  --   --    Thyroid Function Tests  Recent Labs  07/18/14 1449  TSH 1.910    TELE NSR with HR 60-70s    ECG  No new EKG  Echocardiogram 03/07/2014  LV EF: 40% -  45%  ------------------------------------------------------------------- Indications:   CHF - 428.0.  ------------------------------------------------------------------- History:  PMH: Renal failure. Dyspnea. Risk factors: Hypertension.  ------------------------------------------------------------------- Study Conclusions  - Left ventricle: The cavity size was normal. Wall thickness was normal. Systolic function was mildly to moderately reduced. The estimated ejection fraction was in the range of 40% to 45%. Diffuse hypokinesis. There is severe hypokinesis of the inferior myocardium. Doppler parameters are consistent with abnormal left ventricular relaxation (grade 1 diastolic dysfunction). Doppler parameters are consistent with high ventricular filling pressure. - Aortic valve: There  was mild regurgitation. - Mitral valve: Calcified annulus. There was mild regurgitation. - Left atrium: The atrium was moderately dilated. - Right ventricle: The cavity size was mildly dilated. - Right atrium: The atrium was moderately dilated. - Pulmonary arteries: Systolic pressure was moderately increased. PA peak pressure: 55 mm Hg (S).  Impressions:  - Technically difficult apical images; there appears to be severe hypokinesis of the inferior wall with EF 45; biatrial enlargement; mild RVE; mild AI and MR; moderately elevated pulmonary pressures.    Radiology/Studies  Ct Head Wo Contrast  07/18/2014   CLINICAL DATA:  Lethargy, not responding to verbal commands, some jerking movements of face and RIGHT arm, acute encephalopathy, earlier in day was alert  EXAM: CT HEAD WITHOUT CONTRAST  TECHNIQUE: Contiguous axial images were obtained from the base of the skull through the vertex without intravenous contrast.  COMPARISON:  01/09/2014  FINDINGS: Generalized atrophy.  Normal ventricular morphology.  No midline shift or mass effect.  Small vessel chronic ischemic changes of deep cerebral white matter.  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  No intracranial hemorrhage, mass lesion, or evidence acute infarction.  No extra-axial fluid collections.  Atherosclerotic calcification at the carotid siphons bilaterally.  No definite focal bone or sinus abnormality.  IMPRESSION: Atrophy with small vessel chronic ischemic changes of deep cerebral white matter.  Small remote LEFT hemispheric cortical infarct at vertex.  No acute intracranial abnormalities.   Electronically Signed   By: Lavonia Dana M.D.   On: 07/18/2014 13:45   Dg Chest Port 1 View  07/17/2014   CLINICAL DATA:  Follow-up pleural effusion.  Subsequent encounter.  EXAM: PORTABLE CHEST - 1 VIEW  COMPARISON:  Chest radiograph performed 07/15/2014  FINDINGS: A relatively large right-sided pleural effusion is again noted, stable from  the prior study. Underlying airspace opacification is again noted. Vascular congestion is noted. Findings raise concern for asymmetric pulmonary edema, though pneumonia cannot be excluded. No pneumothorax is seen.  The cardiomediastinal silhouette is enlarged. No acute osseous abnormalities are identified.  IMPRESSION: 1. Relatively large right-sided pleural effusion is stable from the prior study. Underlying airspace opacification and vascular congestion noted. Findings raise concern for asymmetric pulmonary edema, though pneumonia cannot be excluded. 2. Cardiomegaly.   Electronically Signed   By: Garald Balding M.D.   On: 07/17/2014 01:33   Dg Chest Port 1 View  07/15/2014   CLINICAL DATA:  Shortness of breath.  CHF.  EXAM: PORTABLE CHEST - 1 VIEW  COMPARISON:  07/10/2014, 07/01/2014, 12/31/2013.  FINDINGS: Cardiomegaly. Pulmonary vascular prominence noted on today's exam. Progressive right-sided pleural effusion noted. Increased density of the upper medial right chest may represent acute remain pleural fluid medially, this is progressed from prior exam. Atelectasis right lower lobe. Pulmonary interstitial prominence. Congestive heart failure is most  likely present. No pneumothorax.  IMPRESSION: 1. Progressive right pleural effusion. 2. Cardiomegaly. Pulmonary vascular prominence noted on today's exam. Progressive interstitial prominence noted. These findings are consistent with congestive heart failure. 3. Right lower lobe atelectasis.   Electronically Signed   By: Marcello Moores  Register   On: 07/15/2014 07:13   Dg Chest Port 1 View  07/10/2014   CLINICAL DATA:  Dyspnea  EXAM: PORTABLE CHEST - 1 VIEW  COMPARISON:  June 21, 2014  FINDINGS: There is a sizable pleural effusion on the right with consolidation involving the right middle and lower lobes. Left lung is clear. Heart is enlarged with pulmonary vascularity within normal limits. No adenopathy appreciable.  IMPRESSION: Persistent right effusion with  consolidation involving essentially all of the right middle and lower lobes. Left lung clear. No change in cardiac silhouette.   Electronically Signed   By: Lowella Grip M.D.   On: 07/10/2014 21:25    ASSESSMENT AND PLAN  1. Acute on chronic combined systolic and diastolic HF:  - no plan for thoracentesis for R pleural effusion for pulmonary  - Echo 03/07/2014 EF 40-45%, severe hypokinesis of inferior myocardium, grade 1 diastolic HF, mild AR/MR, PA peak pressure 65mmHg  - palliative care involved to discuss goal of care, pending discussion with son.  - net -33L, weight down from 240 to 176. ?accuracy. Cr stable, however consider scale back IV lasix to PO. No sign of volume overload on exam other than decreased breath sound in R base consistent with known large R pleural effusion  2. Stage III chronic renal failure: Cr stable on yesterday's lab, check BMET and proBNP today 3. COPD 4. Dementia  - recent episode of AMS, CT of brain negative for acute process.  - did not wake up this morning, however does not appear to be in acute distress, will defer to IM. ?baseline mental status  5. Large R pleural effusion: felt 2/2 CHF, pulmonary does not plan for thoracensis  Ruben Im Pager: [phone]  Patient seen, examined. Available data reviewed. Agree with findings, assessment, and plan as outlined by Almyra Deforest, PA-C. The patient is not able to provide any meaningful history. Exam reveals an elderly man sitting up in the chair eating lunch with assistance. Lungs with markedly diminished BS on the right, CV irregular, extremities without edema.   I have reviewed labs, imaging studies, notes from his current admission. I agree that a palliative approach is appropriate. Will transition to PO lasix today. No ACE/ARB because of advanced CKD and risk of AKI.  CKD is stable at present. Nothing further to add from a cardiac perspective. Please call if we can be of any further assistance.    Sherren Mocha, M.D. 07/21/2014 12:59 PM

## 2014-07-21 NOTE — Progress Notes (Signed)
   I was able to contact Rolm Gala (son) and scheduled an appointment for tomorrow at 1000 am  to discuss diagnosis, prognosis,  GOC and options.  Wadie Lessen NP  Palliative Medicine Team Team Phone # [phone] Pager [phone]

## 2014-07-21 NOTE — Progress Notes (Signed)
Physical Therapy Treatment Patient Details Name: [name] [name] MRN: [id number] DOB: 06/20/1931 Today's Date: 07/21/2014    History of Present Illness 80 year old male with a history of systolic and diastolic CHF, COPD, CKD stage III, diabetes mellitus type 2, dementia presents from [facility] care with shortness of breath 1 day. Apparently, the nursing facility wanted to send the patient to the emergency department on the morning of 07/10/2014 because of shortness of breath. The patient's family took the patient home for Thanksgiving lunch. After he returned, the patient was even more short of breath. He presented to the emergency department where he was noted to have oxygen saturation of 90% on room air. Chest x-ray showed right pleural effusion. ProBNP was 8042. The patient was started on intravenous furosemide. The patient was recently discharged from the hospital on 06/24/2014 with CHF exacerbation.    PT Comments    Patient very lethargic this session and demonstrates significant deficits in mobility this session. Concerned regarding patient mobility status as he was able to ambulate and tolerate therapy with +1 minimal assist last week and now is not even able to perform static standing with +2 assist.  Patient currently with functional decline, will attempt further assessment next session, if no improvements will consider adjusting therapy goals.    Follow Up Recommendations  SNF     Equipment Recommendations  None recommended by PT    Recommendations for Other Services       Precautions / Restrictions Precautions Precautions: Fall Restrictions Weight Bearing Restrictions: No    Mobility  Bed Mobility Overal bed mobility: Needs Assistance Bed Mobility: Rolling;Sidelying to Sit;Sit to Supine Rolling: Mod assist Sidelying to sit: Max assist       General bed mobility comments: patient with no initiation of movement, required max assist to come to EOB    Transfers Overall transfer level: Needs assistance Equipment used: Rolling walker (2 wheeled) Transfers: Sit to/from [facility] Sit to Stand: Max assist Stand pivot transfers: Total assist       General transfer comment: VCs for hand placement and positioning, physical assist required, unable to maintain upright, significant posterior lean inability to correct LE positioning. Unsafe to remain in standing, total assist to chair.   Ambulation/Gait             General Gait Details: patient not safe for ambulation, attempted static standing with patient unsuccessfully.    Stairs            Wheelchair Mobility    Modified Rankin (Stroke Patients Only)       Balance     Sitting balance-Leahy Scale: Fair     Standing balance support: Bilateral upper extremity supported Standing balance-Leahy Scale: Zero Standing balance comment: posterior lean, unable to maintain static standing                    Cognition Arousal/Alertness: Lethargic Behavior During Therapy: WFL for tasks assessed/performed Overall Cognitive Status: Impaired/Different from baseline Area of Impairment: Attention;Following commands;Safety/judgement;Problem solving   Current Attention Level: Focused   Following Commands: Follows one step commands consistently;Follows multi-step commands inconsistently Safety/Judgement: Decreased awareness of safety   Problem Solving: Slow processing;Requires verbal cues;Requires tactile cues General Comments: Patient unable to follow commands for participation despite VC and tactile assist.     Exercises      General Comments        Pertinent Vitals/Pain Pain Assessment: No/denies pain    Home Living  Prior Function            PT Goals (current goals can now be found in the care plan section) Acute Rehab PT Goals Patient Stated Goal: Not stated but agreeable to work on incr mobility PT  Goal Formulation: With patient Time For Goal Achievement: 07/20/14 Potential to Achieve Goals: Good Progress towards PT goals: Not progressing toward goals - comment    Frequency  Min 2X/week    PT Plan Current plan remains appropriate    Co-evaluation             End of Session Equipment Utilized During Treatment: Oxygen (3 liters) Activity Tolerance: Patient tolerated treatment well Patient left: in chair;with call bell/phone within reach;with nursing/sitter in room     Time: 7616-0737 PT Time Calculation (min) (ACUTE ONLY): 18 min  Charges:  $Therapeutic Activity: 8-22 mins                    G CodesTayvin, [place]lar 07/22/14, 3:30 PM Alben Deeds, [place] DPT  [phone]

## 2014-07-21 NOTE — Progress Notes (Signed)
Inpatient Diabetes Program Recommendations  AACE/ADA: New Consensus Statement on Inpatient Glycemic Control (2013)  Target Ranges:  Prepandial:   less than 140 mg/dL      Peak postprandial:   less than 180 mg/dL (1-2 hours)      Critically ill patients:  140 - 180 mg/dL    Patient doing well on 5 units lantus per day and meal coverage as was ordered. Only recommendations at this point would be to decrease the correction to sensitive tidwc (with renal manifestations)   Inpatient Diabetes Program Recommendations Insulin - Basal: Lantus increased from 5 units to 10 units per day (?) Fasting glucose with 5 units has been controlling fastings welll.  Please decrease back to 5 units daily lantus/levemir Correction (SSI): Please consider decreasing the correction scale from Moderate to sensitive and continue the meal coverage at 3 units tidwc as was ordered Insulin - Meal Coverage: Please add back the meal coverage of 3 units tidwc. Thank you, Rosita Kea, RN, CNS, Diabetes Coordinator [phone])

## 2014-07-21 NOTE — Progress Notes (Signed)
Pt had a low CBG of 44, gave OJ, and cranberry juice and PB and crackers, went back to 89, pt is a feeder and needs someone to make sure he eats, if not his CBG will drop, will continue to monitor, thanks Buckner [place].

## 2014-07-21 NOTE — Progress Notes (Signed)
Patient ID: [name] [name]  male  ZOX:[id number]    DOB: 1930-10-02    DOA: 07/10/2014  PCP: [name], NP  Brief history of present illness Patient is a 80 year old male with systolic and diastolic CHF, COPD, CAD stage III, diabetes type 2, dementia presented from skilled nursing facility with shortness of breath for at least one day prior to admission. Apparently, patient's family had taken the patient home for Thanksgiving lunch, after he returned he was more short of breath. Patient was found to have O2 sats of 90% on room air term, chest x-ray showed right pleural effusion, BNP 8042, patient was started on IV diuresis. He was recently discharged from the hospital on 06/24/14 with CHF exacerbation.  Assessment/Plan: Principal Problem:   Acute on chronic respiratory failure likely due to acute on chronic systolic and diastolic CHF exacerbation, possibly worsened due to dietary indiscretion and COPD exacerbation, right-sided pleural effusion.  - 2-D echo on 03/07/14 had shown EF 81-19%, grade 1 diastolic dysfunction. Nuclear medicine stress test on 7/26 showed no reversible ischemia, EF 42% - Cardiology following, still on IV Lasix, may transition to oral Lasix decreased, CO2 stable now. Long-term prognosis poor - Palliative consult placed for goals of care, patient's family has been difficult to reach for goals of care. I discussed in detail yesterday with patient's son, [name] [name] who is now agreeable for palliative medicine meeting.  Active Problems: Acute encephalopathy, asterixis: Some improvement, patient has baseline dementia, has waxing and waning mental status - CT head 12/4 negative for any acute intracranial pathology, ABG showed alkalosis likely metabolic alkalosis from diuresis, Lasix decreased - Patient was evaluated by neurology, did not feel he had any seizures, asterixis probably due to underlying metabolic abnormalities. EEG was canceled.  COPD exacerbation -Resolved,  continue Pulmicort, bronchodilators  Right-sided pleural effusion- recurrent - Pulmonary was consulted, Dr Alva Garnet did not recommend thoracentesis due to prior findings of transudate and recommended optimize adequate therapy for CHF, discontinue steroids, change to metoprolol or no beta blockers. PCCM Signed off     CKD (chronic kidney disease), stage III - Cr 2.0 , slightly trended up today, monitor closely stable  Hypokalemia due to diuresis - Replaced  Urinary retention: With hematuria, ? Traumatic - foley cath placed by urology, Dr Jeffie Pollock 11/30.  - DC'd heparin subcutaneous, placed on SCDs   Diabetes mellitus: Uncontrolled - continue sliding scale insulin - Hemoglobin A1c 5.7, increased Lantus, continue meal coverage  Dementia Continue rivastigmine patch  Hyperlipidemia -  continue statin and fenofibrate  Hypertension Currently stable  Bilateral lower extremities stasis ulcers - Continue dressing changes   DVT Prophylaxis: SCD's  Code Status: DO NOT RESUSCITATE  Family Communication, disposition Discussed in detail with patient's son, [name] [name] on the phone yesterday, agreeable to meet with palliative medicine. Called palliative medicine, Wadie Lessen and she informed me that patient's family had previously hung up the phone on her. Discussed with cardiology will transition to oral Lasix today, if patient's family is unwilling to talk to palliative medicine, will DC back to SNF tomorrow.   Consultants:  Cardiology   Urology   pulmonology  Palliative medicine  Procedures:  Foley catheter placement  Antibiotics:  none    Subjective:  Somewhat sleepy at the time of my encounter however was easily arousable and somewhat conversant. No acute issues overnight.    Objective: Weight change: -1.586 kg (-3 lb 7.9 oz)  Intake/Output Summary (Last 24 hours) at 07/21/14 1140 Last data filed at 07/21/14 1041  Gross per 24 hour  Intake   1420 ml  Output    4410 ml  Net  -2990 ml   Blood pressure 124/79, pulse 70, temperature 98.2 F (36.8 C), temperature source Oral, resp. rate 18, height 5\' 7"  (1.702 m), weight 80.015 kg (176 lb 6.4 oz), SpO2 98 %.  Physical Exam: General: Easily arousable, NAD  CVS: S1-S2 clear Chest: Dec BS at bases Abdomen: soft NT, ND, NBS  Extremities: no c/c, + edema    Lab Results: Basic Metabolic Panel:  Recent Labs Lab 07/18/14 1449  07/20/14 0454 07/21/14 0954  NA 141  < > 139 139  K 4.0  < > 3.4* 3.4*  CL 89*  < > 89* 89*  CO2 44*  < > 39* 39*  GLUCOSE 89  < > 200* 159*  BUN 100*  < > 100* 96*  CREATININE 1.96*  < > 2.06* 1.95*  CALCIUM 9.3  < > 9.1 8.9  MG 2.4  --   --   --   < > = values in this interval not displayed. Liver Function Tests:  Recent Labs Lab 07/17/14 0406  AST 34  ALT 32  ALKPHOS 42  BILITOT 1.6*  PROT 5.8*  ALBUMIN 2.6*   No results for input(s): LIPASE, AMYLASE in the last 168 hours.  Recent Labs Lab 07/18/14 1449  AMMONIA 63*   CBC:  Recent Labs Lab 07/19/14 0609 07/20/14 0454  WBC 8.4 7.6  HGB 13.2 12.8*  HCT 39.1 38.1*  MCV 100.5* 96.7  PLT 177 166   Cardiac Enzymes: No results for input(s): CKTOTAL, CKMB, CKMBINDEX, TROPONINI in the last 168 hours. BNP: Invalid input(s): POCBNP CBG:  Recent Labs Lab 07/20/14 2151 07/20/14 2259 07/21/14 0127 07/21/14 0621 07/21/14 1104  GLUCAP 89 90 195* 143* 241*     Micro Results: No results found for this or any previous visit (from the past 240 hour(s)).  Studies/Results: Ct Head Wo Contrast  07/18/2014   CLINICAL DATA:  Lethargy, not responding to verbal commands, some jerking movements of face and RIGHT arm, acute encephalopathy, earlier in day was alert  EXAM: CT HEAD WITHOUT CONTRAST  TECHNIQUE: Contiguous axial images were obtained from the base of the skull through the vertex without intravenous contrast.  COMPARISON:  01/09/2014  FINDINGS: Generalized atrophy.  Normal ventricular  morphology.  No midline shift or mass effect.  Small vessel chronic ischemic changes of deep cerebral white matter.  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  No intracranial hemorrhage, mass lesion, or evidence acute infarction.  No extra-axial fluid collections.  Atherosclerotic calcification at the carotid siphons bilaterally.  No definite focal bone or sinus abnormality.  IMPRESSION: Atrophy with small vessel chronic ischemic changes of deep cerebral white matter.  Small remote LEFT hemispheric cortical infarct at vertex.  No acute intracranial abnormalities.   Electronically Signed   By: Lavonia Dana M.D.   On: 07/18/2014 13:45   Dg Chest Port 1 View  07/17/2014   CLINICAL DATA:  Follow-up pleural effusion.  Subsequent encounter.  EXAM: PORTABLE CHEST - 1 VIEW  COMPARISON:  Chest radiograph performed 07/15/2014  FINDINGS: A relatively large right-sided pleural effusion is again noted, stable from the prior study. Underlying airspace opacification is again noted. Vascular congestion is noted. Findings raise concern for asymmetric pulmonary edema, though pneumonia cannot be excluded. No pneumothorax is seen.  The cardiomediastinal silhouette is enlarged. No acute osseous abnormalities are identified.  IMPRESSION: 1. Relatively large right-sided pleural effusion is  stable from the prior study. Underlying airspace opacification and vascular congestion noted. Findings raise concern for asymmetric pulmonary edema, though pneumonia cannot be excluded. 2. Cardiomegaly.   Electronically Signed   By: Garald Balding M.D.   On: 07/17/2014 01:33   Dg Chest Port 1 View  07/15/2014   CLINICAL DATA:  Shortness of breath.  CHF.  EXAM: PORTABLE CHEST - 1 VIEW  COMPARISON:  07/10/2014, 07/01/2014, 12/31/2013.  FINDINGS: Cardiomegaly. Pulmonary vascular prominence noted on today's exam. Progressive right-sided pleural effusion noted. Increased density of the upper medial right chest may represent acute remain pleural fluid  medially, this is progressed from prior exam. Atelectasis right lower lobe. Pulmonary interstitial prominence. Congestive heart failure is most likely present. No pneumothorax.  IMPRESSION: 1. Progressive right pleural effusion. 2. Cardiomegaly. Pulmonary vascular prominence noted on today's exam. Progressive interstitial prominence noted. These findings are consistent with congestive heart failure. 3. Right lower lobe atelectasis.   Electronically Signed   By: Marcello Moores  Register   On: 07/15/2014 07:13   Dg Chest Port 1 View  07/10/2014   CLINICAL DATA:  Dyspnea  EXAM: PORTABLE CHEST - 1 VIEW  COMPARISON:  June 21, 2014  FINDINGS: There is a sizable pleural effusion on the right with consolidation involving the right middle and lower lobes. Left lung is clear. Heart is enlarged with pulmonary vascularity within normal limits. No adenopathy appreciable.  IMPRESSION: Persistent right effusion with consolidation involving essentially all of the right middle and lower lobes. Left lung clear. No change in cardiac silhouette.   Electronically Signed   By: Lowella Grip M.D.   On: 07/10/2014 21:25    Medications: Scheduled Meds: . albuterol  2.5 mg Nebulization TID  . aspirin  324 mg Oral Once  . aspirin EC  81 mg Oral QPM  . atorvastatin  20 mg Oral QPM  . budesonide  0.25 mg Nebulization BID  . feeding supplement (PRO-STAT SUGAR FREE 64)  30 mL Oral TID WC  . fenofibrate  160 mg Oral QPM  . furosemide  80 mg Intravenous Q12H  . insulin aspart  0-15 Units Subcutaneous TID WC  . insulin aspart  5 Units Subcutaneous TID WC  . insulin glargine  5 Units Subcutaneous Daily  . isosorbide-hydrALAZINE  1 tablet Oral TID  . latanoprost  1 drop Left Eye QHS  . metoprolol succinate  25 mg Oral Daily  . mupirocin ointment  1 application Nasal BID  . rivastigmine  4.6 mg Transdermal Daily  . tobramycin  2 drop Left Eye 6 times per day      LOS: 11 days   Seairra Otani M.D. [facility] 07/21/2014, 11:40 AM Pager: [phone]  If 7PM-7AM, please contact night-coverage [email or web address] Password [email or web address]

## 2014-07-22 DIAGNOSIS — R0600 Dyspnea, unspecified: Secondary | ICD-10-CM

## 2014-07-22 DIAGNOSIS — R627 Adult failure to thrive: Secondary | ICD-10-CM

## 2014-07-22 LAB — GLUCOSE, CAPILLARY
GLUCOSE-CAPILLARY: 164 mg/dL — AB (ref 70–99)
Glucose-Capillary: 242 mg/dL — ABNORMAL HIGH (ref 70–99)
Glucose-Capillary: 119 mg/dL — ABNORMAL HIGH (ref 70–99)
Glucose-Capillary: 153 mg/dL — ABNORMAL HIGH (ref 70–99)

## 2014-07-22 MED ORDER — MORPHINE SULFATE (CONCENTRATE) 10 MG/0.5ML PO SOLN: 5.0000 mg | ORAL | Status: DC | PRN

## 2014-07-22 MED ORDER — POTASSIUM CHLORIDE CRYS ER 20 MEQ PO TBCR
40.0000 meq | EXTENDED_RELEASE_TABLET | Freq: Once | ORAL | Status: AC
  Administered 2014-07-22: 40 meq via ORAL
  Filled 2014-07-22: qty 2

## 2014-07-22 MED ORDER — METOPROLOL SUCCINATE ER 25 MG PO TB24: 25.0000 mg | ORAL_TABLET | Freq: Every day | ORAL | Status: AC

## 2014-07-22 MED ORDER — MORPHINE SULFATE (CONCENTRATE) 10 MG/0.5ML PO SOLN: 5.0000 mg | ORAL | Status: AC | PRN

## 2014-07-22 MED ORDER — FUROSEMIDE 80 MG PO TABS: 80.0000 mg | ORAL_TABLET | Freq: Two times a day (BID) | ORAL | Status: DC

## 2014-07-22 MED ORDER — POTASSIUM CHLORIDE CRYS ER 20 MEQ PO TBCR: 40.0000 meq | EXTENDED_RELEASE_TABLET | Freq: Every day | ORAL | Status: AC

## 2014-07-22 MED ORDER — LORAZEPAM 1 MG PO TABS: 1.0000 mg | ORAL_TABLET | ORAL | Status: DC | PRN

## 2014-07-22 MED ORDER — TOBRAMYCIN 0.3 % OP SOLN: 2.0000 [drp] | OPHTHALMIC | Status: AC

## 2014-07-22 MED ORDER — LORAZEPAM 1 MG PO TABS: 1.0000 mg | ORAL_TABLET | Freq: Three times a day (TID) | ORAL | Status: AC | PRN

## 2014-07-22 MED ORDER — BUDESONIDE 0.25 MG/2ML IN SUSP: 0.2500 mg | Freq: Two times a day (BID) | RESPIRATORY_TRACT | Status: AC

## 2014-07-22 MED ORDER — BISACODYL 10 MG RE SUPP: 10.0000 mg | Freq: Every day | RECTAL | Status: DC | PRN

## 2014-07-22 NOTE — Consult Note (Signed)
Patient WN:IOEV Garson      DOB: 10/28/1930      OJJ:[id number]     Consult Note from the Palliative Medicine Team at [facility] Requested by: Dr Tana Coast     PCP: [name], NP Reason for Consultation:  Clarification of [place] and options    Phone [phone]  Assessment of patients Current state:   Continued physical, functional and cognitive decline 2/2 to multiple co-morbidities; dementia, CKD, AS, PAD, CHF, recurrent pleural effusion/lung nodule, pulmonary fibrosis, acute/chronic respiratory failure with frequent rehospitalization.  Rapid decline, continued weight loss, poor po intake (down to sips and bites over the last 48 hrs), expected need for aggressive symptom management  related to his disease and expected dyspnea; prognosis is likley less than 2 weeks.  Family faced with advanced directive decisions and anticipatory care needs.   Consult is for review of medical treatment options, clarification of goals of care and end of life issues, disposition and options, and symptom recommendation.  This NP Wadie Lessen reviewed medical records, received report from team, assessed the patient and then meet at the patient's bedside along with his son Emmanual Johnson/main decision maker and his Bing Neighbors  to discuss diagnosis prognosis, GOC, EOL wishes disposition and options.  A detailed discussion was had today regarding advanced directives.  Concepts specific to code status, artifical feeding and hydration, continued IV antibiotics and rehospitalization was had.  The difference between a aggressive medical intervention path  and a palliative comfort care path for this patient at this time was had.  Values and goals of care important to patient and family were attempted to be elicited.  Concept of Hospice and Palliative Care were discussed  Natural trajectory and expectations at EOL were discussed.  Questions and concerns addressed.  Hard Choices booklet left for  review. Family encouraged to call with questions or concerns.  PMT will continue to support holistically.   Goals of Care: 1.  Code Status:  DNR/DNI-comfort is main focus of care   2. Scope of Treatment: 1. Vital Signs: daily  2. Respiratory/Oxygen:for comfort only 3. Nutritional Support/Tube Feeds: no artifiacl feeding or hydration now or in the future          -comfort feeds as toerlated 4. Antibiotics: none 5. Blood Products:none 6. HBZ:JIRC 7. Review of Medications to be discontinued:minimize for comfort, oral medications for medical management only, no further IV lasix 8. Labs: none 9. Telemetry:none   3. Disposition:  Hopeful for residential hospice placement, family desires an environment of peace and privacy with this know limited prognosis   4. Symptom Management:    Anxiety/Agitation:Ativan 1 mg po/sl every 4 hrs prn  Pain/Dyspnea: Roxanol 5 mg po/sl every 1 hr prn  Bowel Regimen: Dulcolax supp prn  5. Psychosocial:  Emotional support offered to family at bedside, [name] is tearful expressing his grief and sadness as he comes to terms with his father's mortality.  He is especially saddened that his brother remain incarcerated  at this time.  Patient Documents Completed or Given: Document Given Completed  Advanced Directives Pkt    MOST    DNR X   Gone from My Sight X   Hard Choices X     Brief HPI: 80 year old male with systolic and diastolic CHF, COPD, CAD stage III, diabetes type 2, dementia presented from skilled nursing facility with shortness of breath for at least one day prior to admission. Apparently, patient's family had taken the patient home for Thanksgiving  lunch, after he returned he was more short of breath. Patient was found to have O2 sats of 90% on room air term, chest x-ray showed right pleural effusion, BNP 8042, patient was started on IV diuresis. He was recently discharged from the hospital on 06/24/14 with CHF exacerbation  ROS: unable  to illicit 2/2 to decreased  Mental status   PMH:  Past Medical History  Diagnosis Date  . Hypertension   . Diabetes mellitus     a. Diet-controlled.  . Arthritis   . Chronic diastolic CHF (congestive heart failure)     a. Primarily diastolic CHF & RV dysfunction but systolic dysfunction. 2D Echo 03/07/14 showed EF 40-45%, diffuse HK, grade 1 diastolic dysfunction, mild MR/AI, mod dilated LA/RA, mildly dilated RV, PA pressure 59mHg. Lexiscan nuc 02/2014: EF 43% with no ischemia, inferior hypokinesis with "inferior attenuation." Dr. Aundra Dubin suspected probable prior inferior MI.  Marland Kitchen COPD (chronic obstructive pulmonary disease)   . Hyperlipidemia   . CKD (chronic kidney disease), stage III     renal insuficiency  . Inferior MI     a. Based on nuc results 02/2014, Dr. Aundra Dubin suspected probable prior inferior MI (EF 43% with no ischemia, inferior hypokinesis with "inferior attenuation").  . Dementia   . PAD (peripheral artery disease)     a. ABI 02/2014: R 0.45, L 0.56. R foot nonhealing ulcers.  . Mild mitral regurgitation   . Mild AI (aortic insufficiency)   . Lung disease     a. Possible interstitial lung disease: Followed by [name]. PFTs (1/15) with FVC 53%, FEV1 46%, ratio 85%, TLC 62%, DLCO 45% (restrictive defect).  . Human metapneumovirus pneumonia     a. Metapneumovirus PNA in 5/15 with acute respiratory failure and intubation. Bilateral transudative pleural effusions.     PSH: Past Surgical History  Procedure Laterality Date  . Leg surgery      both legs - unsure of exact operations - from trauma  . Tonsillectomy    . Appendectomy    . Cataract extraction w/phaco Left 09/04/2013    Procedure: CATARACT EXTRACTION PHACO AND INTRAOCULAR LENS PLACEMENT (IOC);  Surgeon: Adonis Brook, MD;  Location: [facility];  Service: Ophthalmology;  Laterality: Left;   I have reviewed the [place] and SH and  If appropriate update it with new information. No Known Allergies Scheduled Meds: .  albuterol  2.5 mg Nebulization TID  . aspirin  324 mg Oral Once  . aspirin EC  81 mg Oral QPM  . atorvastatin  20 mg Oral QPM  . budesonide  0.25 mg Nebulization BID  . feeding supplement (PRO-STAT SUGAR FREE 64)  30 mL Oral TID WC  . fenofibrate  160 mg Oral QPM  . furosemide  80 mg Oral BID  . insulin aspart  0-5 Units Subcutaneous QHS  . insulin aspart  0-9 Units Subcutaneous TID WC  . insulin aspart  3 Units Subcutaneous TID WC  . insulin glargine  5 Units Subcutaneous Daily  . isosorbide-hydrALAZINE  1 tablet Oral TID  . latanoprost  1 drop Left Eye QHS  . metoprolol succinate  25 mg Oral Daily  . mupirocin ointment  1 application Nasal BID  . potassium chloride  40 mEq Oral Once  . rivastigmine  4.6 mg Transdermal Daily  . tobramycin  2 drop Left Eye 6 times per day   Continuous Infusions:  PRN Meds:.acetaminophen, albuterol    BP 134/58 mmHg  Pulse 77  Temp(Src) 97.9 F (36.6 C) (Oral)  Resp 18  Ht 5\' 7"  (1.702 m)  Wt 76.9 kg (169 lb 8.5 oz)  BMI 26.55 kg/m2  SpO2 100%   PPS:30 % at best   Intake/Output Summary (Last 24 hours) at 07/22/14 1043 Last data filed at 07/22/14 0900  Gross per 24 hour  Intake    540 ml  Output   2925 ml  Net  -2385 ml     Physical Exam:  General: ill appearing, minimally responsive HEENT:  Dry buccal membranes, poor dentation Chest:   Diminished throughout  CVS: RRR     Labs: CBC    Component Value Date/Time   WBC 7.6 07/20/2014 0454   RBC 3.94* 07/20/2014 0454   HGB 12.8* 07/20/2014 0454   HCT 38.1* 07/20/2014 0454   PLT 166 07/20/2014 0454   MCV 96.7 07/20/2014 0454   MCH 32.5 07/20/2014 0454   MCHC 33.6 07/20/2014 0454   RDW 14.6 07/20/2014 0454   LYMPHSABS 1.0 07/10/2014 2005   MONOABS 0.7 07/10/2014 2005   EOSABS 0.2 07/10/2014 2005   BASOSABS 0.0 07/10/2014 2005    BMET    Component Value Date/Time   NA 139 07/21/2014 0954   K 3.4* 07/21/2014 0954   CL 89* 07/21/2014 0954   CO2 39* 07/21/2014  0954   GLUCOSE 159* 07/21/2014 0954   BUN 96* 07/21/2014 0954   CREATININE 1.95* 07/21/2014 0954   CALCIUM 8.9 07/21/2014 0954   GFRNONAA 31* 07/21/2014 0954   GFRAA 35* 07/21/2014 0954    CMP     Component Value Date/Time   NA 139 07/21/2014 0954   K 3.4* 07/21/2014 0954   CL 89* 07/21/2014 0954   CO2 39* 07/21/2014 0954   GLUCOSE 159* 07/21/2014 0954   BUN 96* 07/21/2014 0954   CREATININE 1.95* 07/21/2014 0954   CALCIUM 8.9 07/21/2014 0954   PROT 5.8* 07/17/2014 0406   ALBUMIN 2.6* 07/17/2014 0406   AST 34 07/17/2014 0406   ALT 32 07/17/2014 0406   ALKPHOS 42 07/17/2014 0406   BILITOT 1.6* 07/17/2014 0406   GFRNONAA 31* 07/21/2014 0954   GFRAA 35* 07/21/2014 0954       Time In Time Out Total Time Spent with Patient Total Overall Time  1000 1115 70 min 75 min    Greater than 50%  of this time was spent counseling and coordinating care related to the above assessment and plan.   Wadie Lessen NP  Palliative Medicine Team Team Phone # [phone] Pager [phone]  Discussed with Kendell Bane LCSW

## 2014-07-22 NOTE — Discharge Summary (Addendum)
Physician Discharge Summary  Patient ID: [name] [name] MRN: [id number] DOB/AGE: 03/28/1931 80 y.o.  Admit date: 07/10/2014 Discharge date: 07/23/2014  Primary Care Physician:  Cletis Athens [facility], NP  Discharge Diagnoses:    .  Acute on chronic combined systolic and diastolic HF (heart failure) . Acute on chronic respiratory failure .  Recurrent Rt pleural effussion . COPD (chronic obstructive pulmonary disease) exacerbation  . CKD (chronic kidney disease), stage III . Essential hypertension . Acute on chronic combined systolic and diastolic congestive heart failure .  urinary retention with hematuria  . Type 2 diabetes mellitus with autonomic neuropathy . Hyperlipidemia . Dementia-SNF pt  Consults: Cardiology, Dr. Burt Knack Pulmonology, Dr. Alva Garnet Palliative medicine   Recommendations for Outpatient Follow-up:  Please follow CBC, BMET, long-term prognosis poor, cardiology recommended hospice   Allergies:  No Known Allergies   Discharge Medications:   Medication List    STOP taking these medications        carvedilol 6.25 MG tablet  Commonly known as:  COREG     mupirocin ointment 2 %  Commonly known as:  BACTROBAN      TAKE these medications        acetaminophen 500 MG tablet  Commonly known as:  TYLENOL  Take 500 mg by mouth every 6 (six) hours as needed for moderate pain.     albuterol 108 (90 BASE) MCG/ACT inhaler  Commonly known as:  PROVENTIL HFA;VENTOLIN HFA  Inhale 2 puffs into the lungs every 4 (four) hours as needed for wheezing or shortness of breath.     albuterol (2.5 MG/3ML) 0.083% nebulizer solution  Commonly known as:  PROVENTIL  Take 2.5 mg by nebulization 3 (three) times daily.     aspirin EC 81 MG tablet  Take 81 mg by mouth every evening.     atorvastatin 20 MG tablet  Commonly known as:  LIPITOR  Take 1 tablet (20 mg total) by mouth every evening.     budesonide 0.25 MG/2ML nebulizer solution  Commonly known as:  PULMICORT  Take 2  mLs (0.25 mg total) by nebulization 2 (two) times daily.     cholecalciferol 1000 UNITS tablet  Commonly known as:  VITAMIN D  Take 2,000 Units by mouth daily.     feeding supplement (PRO-STAT SUGAR FREE 64) Liqd  Take 30 mLs by mouth 3 (three) times daily with meals.     fenofibrate 160 MG tablet  Take 160 mg by mouth every evening.     gabapentin 100 MG capsule  Commonly known as:  NEURONTIN  Take 200 mg by mouth 2 (two) times daily.     guaiFENesin 600 MG 12 hr tablet  Commonly known as:  MUCINEX  Take 600 mg by mouth every 12 (twelve) hours.     isosorbide-hydrALAZINE 20-37.5 MG per tablet  Commonly known as:  BIDIL  Take 1 tablet by mouth 3 (three) times daily.     LORazepam 1 MG tablet  Commonly known as:  ATIVAN  Take 1 tablet (1 mg total) by mouth every 8 (eight) hours as needed for anxiety.     metoprolol succinate 25 MG 24 hr tablet  Commonly known as:  TOPROL-XL  Take 1 tablet (25 mg total) by mouth daily.     morphine CONCENTRATE 10 MG/0.5ML Soln concentrated solution  Take 0.25 mLs (5 mg total) by mouth every hour as needed for moderate pain, severe pain or shortness of breath.     potassium chloride SA 20 MEQ tablet  Commonly known as:  K-DUR,KLOR-CON  Take 2 tablets (40 mEq total) by mouth daily.     rivastigmine 4.6 mg/24hr  Commonly known as:  EXELON  Place 4.6 mg onto the skin daily.     tobramycin 0.3 % ophthalmic solution  Commonly known as:  TOBREX  Place 2 drops into the left eye every 4 (four) hours. X 1 week     torsemide 20 MG tablet  Commonly known as:  DEMADEX  Take 40 mg by mouth 2 (two) times daily.     TRAVATAN Z 0.004 % Soln ophthalmic solution  Generic drug:  Travoprost (BAK Free)  Place 1 drop into the left eye at bedtime.     traZODone 50 MG tablet  Commonly known as:  DESYREL  Take 50 mg by mouth at bedtime.         Brief H and P: For complete details please refer to admission H and P, but in brief patient is a  80 year old male presented to ED with shortness of breath. Patient is a skilled nursing facility resident and was concerned for possible CHF exacerbation. Skilled nursing facility wanted to send the patient to the ED earlier for shortness of breath however his family took him out of the facility on the day of admission for Thanksgiving lunch. After returning to the nursing home, patient was even more short of breath. He was finally sent to the ED via EMS. Patient was unable to appropriately answer questions, his shortness of breath and low O2 sats and did improve and ED with the breathing treatments. Patient has underlying dementia.  Hospital Course:  Acute on chronic respiratory failure likely due to acute on chronic systolic and diastolic CHF exacerbation, possibly worsened due to dietary indiscretion and COPD exacerbation, right-sided pleural effusion.  - 2-D echo on 03/07/14 had shown EF 85-02%, grade 1 diastolic dysfunction. Nuclear medicine stress test on 7/26 showed no reversible ischemia, EF 42%. Cardiology was consulted and patient was placed on IV diuresis, high-dose. Patient did have improvement with shortness of breath, currently 36 L net negative. Weight has improved  From 240 pounds to 169 lbs. Discussed in detail with Dr. Burt Knack from cardiology service at the time of discharge recommended to continue outpatient dose of Demadex. Overall long-term prognosis is still poor. Continue Demadex and low-salt diet. Patient's son and family were explained in detail about the poor prognosis, underlying dementia, chronic respiratory failure and palliative medicine consult was obtained. Patient is DO NOT RESUSCITATE status.   Acute encephalopathy, asterixis: Patient has underlying dementia at baseline, on 12/4, patient was noted to be more lethargic. CT showed no acute intracranial pathology. ABG showed alkalosis likely metabolic alkalosis from diuresis. Lasix was decreased. Due to jerking movements of  his arm, neurology was consulted. After evaluation by neurology did not feel that he had any seizures, likely he has asterixis due to underlying metabolic abnormalities. EEG was canceled.   COPD exacerbation-Resolved, continue Pulmicort, bronchodilators  Right-sided pleural effusion- recurrent - Pulmonary was consulted, Dr Alva Garnet did not recommend thoracentesis due to prior findings of transudate and recommended optimize adequate therapy for CHF, discontinue steroids, change to metoprolol or no beta blockers. No further recommendations by pulmonary critical care.    CKD (chronic kidney disease), stage III - Cr 1.9, at baseline.   Hypokalemia due to diuresis - Replaced  Urinary retention: With hematuria, ? Traumatic - foley cath placed by urology, Dr Jeffie Pollock 11/30. Patient can follow up outpatient with urology.   Diabetes mellitus:  Hemoglobin A1c 5.7  Dementia Continue rivastigmine patch  Hyperlipidemia - continue statin and fenofibrate  Hypertension Currently stable  Bilateral lower extremities stasis ulcers - Continue dressing changes    Day of Discharge BP 134/58 mmHg  Pulse 77  Temp(Src) 97.9 F (36.6 C) (Oral)  Resp 18  Ht 5\' 7"  (1.702 m)  Wt 76.9 kg (169 lb 8.5 oz)  BMI 26.55 kg/m2  SpO2 100%  Physical Exam: General: Alert and awake oriented self, NAD CVS: S1-S2 clear  Chest: clear to auscultation bilaterally Abdomen: soft nontender, nondistended, normal bowel sounds Extremities: no cyanosis, clubbing, + edema noted bilaterally    The results of significant diagnostics from this hospitalization (including imaging, microbiology, ancillary and laboratory) are listed below for reference.    LAB RESULTS: Basic Metabolic Panel:  Recent Labs Lab 07/18/14 1449  07/20/14 0454 07/21/14 0954  NA 141  < > 139 139  K 4.0  < > 3.4* 3.4*  CL 89*  < > 89* 89*  CO2 44*  < > 39* 39*  GLUCOSE 89  < > 200* 159*  BUN 100*  < > 100* 96*  CREATININE 1.96*  < >  2.06* 1.95*  CALCIUM 9.3  < > 9.1 8.9  MG 2.4  --   --   --   < > = values in this interval not displayed. Liver Function Tests:  Recent Labs Lab 07/17/14 0406  AST 34  ALT 32  ALKPHOS 42  BILITOT 1.6*  PROT 5.8*  ALBUMIN 2.6*   No results for input(s): LIPASE, AMYLASE in the last 168 hours.  Recent Labs Lab 07/18/14 1449  AMMONIA 63*   CBC:  Recent Labs Lab 07/19/14 0609 07/20/14 0454  WBC 8.4 7.6  HGB 13.2 12.8*  HCT 39.1 38.1*  MCV 100.5* 96.7  PLT 177 166   Cardiac Enzymes: No results for input(s): CKTOTAL, CKMB, CKMBINDEX, TROPONINI in the last 168 hours. BNP: Invalid input(s): POCBNP CBG:  Recent Labs Lab 07/21/14 2134 07/22/14 0647  GLUCAP 242* 119*    Significant Diagnostic Studies:  Dg Chest Port 1 View  07/10/2014   CLINICAL DATA:  Dyspnea  EXAM: PORTABLE CHEST - 1 VIEW  COMPARISON:  June 21, 2014  FINDINGS: There is a sizable pleural effusion on the right with consolidation involving the right middle and lower lobes. Left lung is clear. Heart is enlarged with pulmonary vascularity within normal limits. No adenopathy appreciable.  IMPRESSION: Persistent right effusion with consolidation involving essentially all of the right middle and lower lobes. Left lung clear. No change in cardiac silhouette.   Electronically Signed   By: Lowella Grip M.D.   On: 07/10/2014 21:25    2D ECHO:   Disposition and Follow-up: Discharge Instructions    ([place]) Call MD:  Anytime you have any of the following symptoms: 1) 3 pound weight gain in 24 hours or 5 pounds in 1 week 2) shortness of breath, with or without a dry hacking cough 3) swelling in the hands, feet or stomach 4) if you have to sleep on extra pillows at night in order to breathe.    Complete by:  As directed      Diet - low sodium heart healthy    Complete by:  As directed      Increase activity slowly    Complete by:  As directed             DISPOSITION: SNF  DIET:  Low-sodium heart healthy diet  TESTS THAT NEED FOLLOW-UP CBC, BMET  DISCHARGE FOLLOW-UP Follow-up Information    Follow up with [name], NP. Schedule an appointment as soon as possible for a visit in 10 days.   Specialty:  Nurse Practitioner   Why:  for hospital follow-up   Contact information:   [place] [address] [phone]       Follow up with [facility] .   Why:  at 9:15AM , for hospital follow-up   Contact information:   [address] [place] [address]       Time spent on Discharge: 40 minutes  Signed:   Estill Cotta M.D. [facility] 07/22/2014, 12:04 PM Pager: [phone]

## 2014-07-22 NOTE — Progress Notes (Signed)
Palliative Care NP- Wadie Lessen met with patient's son [name] this morning and it was determined that patient is residential hospice home appropriate.  CSW spoke with patient's son and provided residential hospice home choices;  He prefers [facility] as first choice and then [facility] as second choice. CSW spoke via text with Erling Conte, [place] for [facility]. Currently they do not have any vacancies nor are any anticipated for tomorrow.  CSW made referral to [facility] at [facility] for follow up and will discuss further with Beverlee Nims, RN Liaison for [facility].  Above discussed with Dr. Tana Coast who is very pleased with residential hospice home recommendation as she feels that this is most appropriate option for patient rather than return to SNF level.  CSW wil monitor and assist patient and son with d/c when arrangements are completed. Lorie Phenix. Pauline Good, [place]

## 2014-07-22 NOTE — Progress Notes (Addendum)
Pt was discharge, and his IV, tele, and foley have been removed.  Per physician, it is ok to leave all out, and not to re-insert.      Per dayshift nurse, patient has not eaten all day.  Will monitor CBGs

## 2014-07-22 NOTE — Progress Notes (Signed)
UR completed Face to Face order needed 

## 2014-07-23 DIAGNOSIS — R627 Adult failure to thrive: Secondary | ICD-10-CM

## 2014-07-23 LAB — GLUCOSE, CAPILLARY: GLUCOSE-CAPILLARY: 167 mg/dL — AB (ref 70–99)

## 2014-07-23 NOTE — Progress Notes (Signed)
CSW contacted Erling Conte, LCSW, Liaison for [facility] this morning to determine if there was any possibility of a vacancy at [facility]. She stated that there were no available beds today.  Will ask [facility] to proceed to admission process.  Yetta Glassman, Hospice Nurse for [facility] is already aware of patient had planned to check in on patient last night.  Per MD- patient is medically stable for d/c.  Lorie Phenix. Pauline Good, [place]

## 2014-07-23 NOTE — Progress Notes (Signed)
[facility] PROGRESS NOTE  [name] [name] KZS:[id number] DOB: 07/19/1931 DOA: 07/10/2014 PCP: [name], NP  Assessment/Plan: 1. Acute on chronic hypoxemic respiratory failure -Secondary to acute combined systolic and diastolic congestive heart failure exacerbation with superimposed COPD exacerbation. -Status post transthoracic echocardiogram on 03/07/2014 showing ejection fraction 40-45%, severe hypokinesis of the inferior myocardium. -Patient have an overall poor prognosis. -During this hospitalization was transitioned to comfort care  2.  Chronic obstructive pulmonary disease exacerbation -Resolving  3.  Right-sided pleural effusion -Patient with history of recurrent right-sided pleural effusions, likely related to congestive heart failure. -Pulmonary critical care medicine did not recommend thoracentesis  4.  Acute encephalopathy.  -Patient with history of advanced dementia, with CT scan of brain negative for acute intracranial changes -Respiratory failure, CHF exacerbation, COPD exacerbation on probable contributors  5.  Goals of care. Palliative medicine consulted during this hospitalization, family meeting held. Transitioned to comfort care. Awaiting transfer to inpatient hospice  Code Status: DO NOT RESUSCITATE Family Communication:  Disposition Plan: Plan for transfer to inpatient hospice   Consultants:  Cardiology   Urology  pulmonology  Palliative medicine   HPI/Subjective: Patient was arousable, did respond to a few questions, appears sleepy. He does not appear to be in severe distress or pain  Objective: Filed Vitals:   07/23/14 0949  BP: 161/48  Pulse: 74  Temp:   Resp:     Intake/Output Summary (Last 24 hours) at 07/23/14 1132 Last data filed at 07/23/14 0853  Gross per 24 hour  Intake    360 ml  Output    400 ml  Net    -40 ml   Filed Weights   07/21/14 0613 07/22/14 0519 07/23/14 0528  Weight: 80.015 kg (176 lb 6.4 oz) 76.9  kg (169 lb 8.5 oz) 74.2 kg (163 lb 9.3 oz)    Exam:  General: Easily arousable, NAD  CVS: S1-S2 clear Chest: Dec BS at bases Abdomen: soft NT, ND, NBS  Extremities: no c/c, + edema   Data Reviewed: Basic Metabolic Panel:  Recent Labs Lab 07/18/14 0410 07/18/14 1449 07/19/14 0609 07/20/14 0454 07/21/14 0954  NA 139 141 143 139 139  K 4.6 4.0 3.9 3.4* 3.4*  CL 86* 89* 89* 89* 89*  CO2 39* 44* 43* 39* 39*  GLUCOSE 181* 89 90 200* 159*  BUN 105* 100* 98* 100* 96*  CREATININE 1.98* 1.96* 1.91* 2.06* 1.95*  CALCIUM 9.0 9.3 9.4 9.1 8.9  MG  --  2.4  --   --   --    Liver Function Tests:  Recent Labs Lab 07/17/14 0406  AST 34  ALT 32  ALKPHOS 42  BILITOT 1.6*  PROT 5.8*  ALBUMIN 2.6*   No results for input(s): LIPASE, AMYLASE in the last 168 hours.  Recent Labs Lab 07/18/14 1449  AMMONIA 63*   CBC:  Recent Labs Lab 07/18/14 0410 07/19/14 0609 07/20/14 0454  WBC 8.0 8.4 7.6  HGB 13.7 13.2 12.8*  HCT 40.9 39.1 38.1*  MCV 97.1 100.5* 96.7  PLT 164 177 166   Cardiac Enzymes: No results for input(s): CKTOTAL, CKMB, CKMBINDEX, TROPONINI in the last 168 hours. BNP (last 3 results)  Recent Labs  07/10/14 2005 07/15/14 0333 07/21/14 0954  PROBNP 8042.0* 10438.0* 4458.0*   CBG:  Recent Labs Lab 07/21/14 2134 07/22/14 0647 07/22/14 1144 07/22/14 1628 07/22/14 2113  GLUCAP 242* 119* 153* 164* 167*    No results found for this or any previous visit (from the past 240  hour(s)).   Studies: No results found.  Scheduled Meds: . albuterol  2.5 mg Nebulization TID  . aspirin  324 mg Oral Once  . budesonide  0.25 mg Nebulization BID  . feeding supplement (PRO-STAT SUGAR FREE 64)  30 mL Oral TID WC  . fenofibrate  160 mg Oral QPM  . furosemide  80 mg Oral BID  . isosorbide-hydrALAZINE  1 tablet Oral TID  . latanoprost  1 drop Left Eye QHS  . metoprolol succinate  25 mg Oral Daily  . mupirocin ointment  1 application Nasal BID  . rivastigmine   4.6 mg Transdermal Daily  . tobramycin  2 drop Left Eye 6 times per day   Continuous Infusions:   Principal Problem:   Acute on chronic respiratory failure Active Problems:   CKD (chronic kidney disease), stage III    Recurrent Rt pleural effussion   COPD (chronic obstructive pulmonary disease)   Acute on chronic combined systolic and diastolic congestive heart failure   Dementia-SNF pt   Hyperlipidemia   Essential hypertension   Type 2 diabetes mellitus with autonomic neuropathy   Cardiomyopathy-EF 40-45%   CHF exacerbation   Acute encephalopathy   Dyspnea   Adult failure to thrive    Time spent: 42 min    Kelvin Cellar  [facility] Pager [phone]. If 7PM-7AM, please contact night-coverage at [email or web address], password [facility] 07/23/2014, 11:32 AM  LOS: 13 days

## 2014-07-23 NOTE — Progress Notes (Signed)
Called and gave report to nurse admitting pt to residential [facility] care.  She had no further questions.

## 2014-07-24 ENCOUNTER — Encounter (HOSPITAL_COMMUNITY): Payer: Self-pay | Admitting: Cardiology

## 2014-07-29 ENCOUNTER — Inpatient Hospital Stay (HOSPITAL_COMMUNITY): Payer: PRIVATE HEALTH INSURANCE

## 2014-08-15 DEATH — deceased

## 2015-03-14 IMAGING — CT CT HEAD W/O CM
1 series · 15 of 30 positions shown, 19 images · non-contrast
Comparison: None.

CLINICAL DATA: Intermittent loss of consciousness/ syncope.

EXAM:
CT HEAD WITHOUT CONTRAST
TECHNIQUE: Contiguous axial images were obtained from the base of the skull
through the vertex without intravenous contrast.

[Series 2: head 5.0 h30s · axial · 0.54mm/px · z∈[-115,+30]mm · 15 of 33 slices shown, 19 images]
[im 2/33  brain]
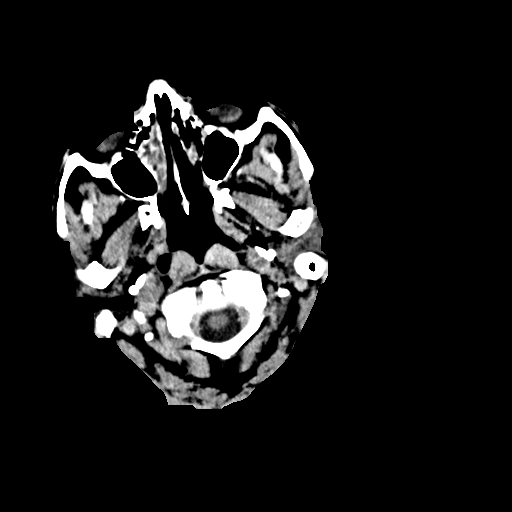
[im 2/33  bone]
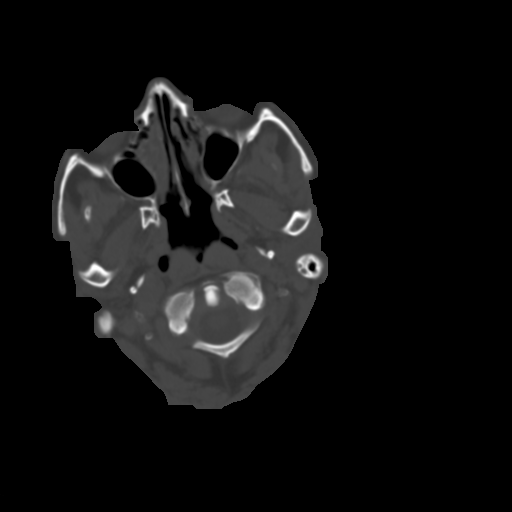
[im 4/33  brain]
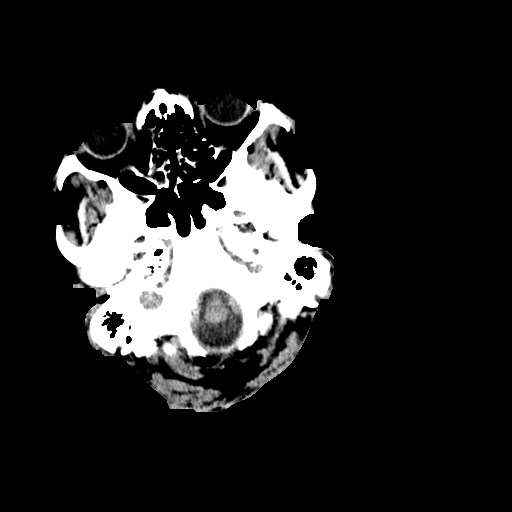
[im 6/33  brain]
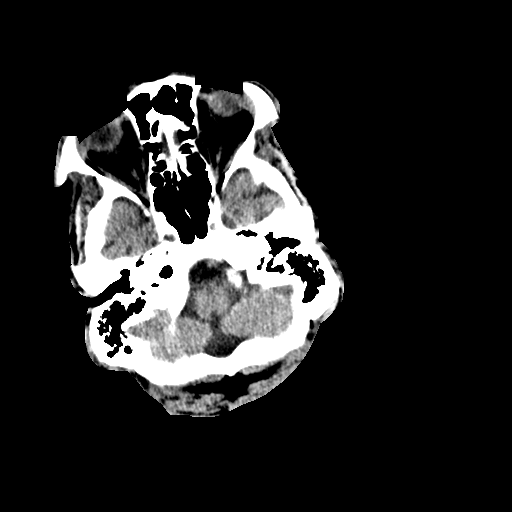
[im 8/33  brain]
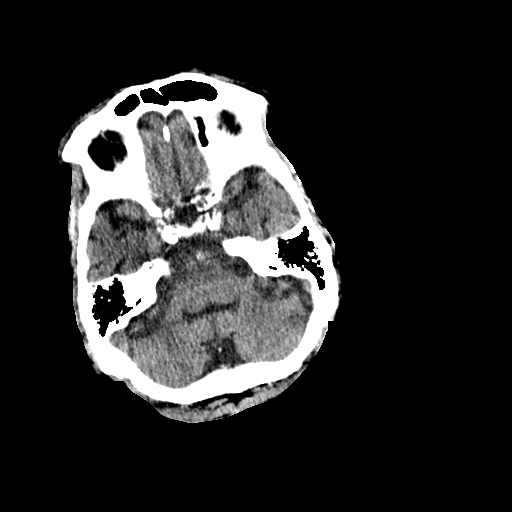
[im 10/33  brain]
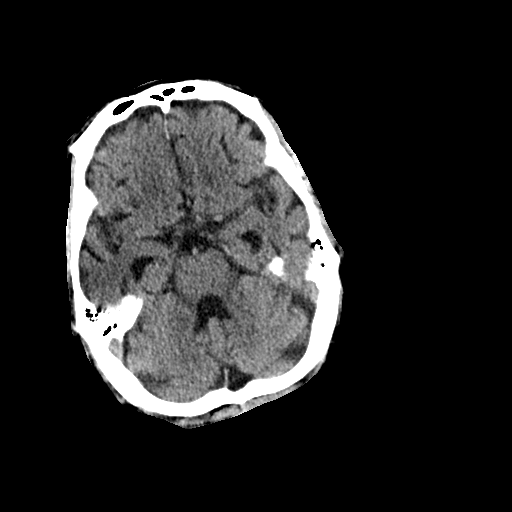
[im 10/33  bone]
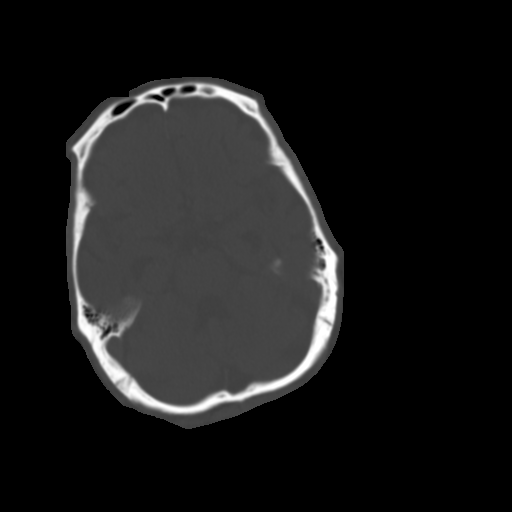
[im 13/33  brain]
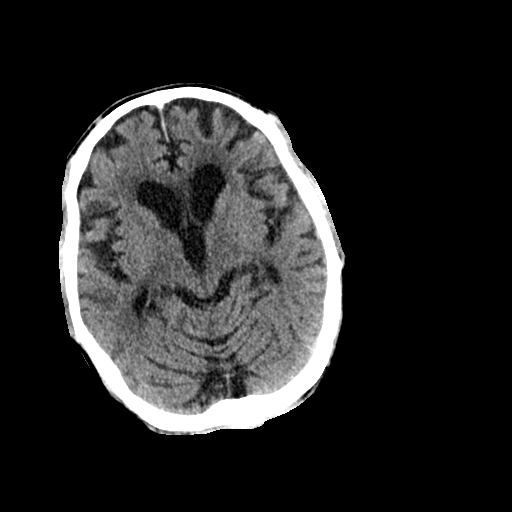
[im 15/33  brain]
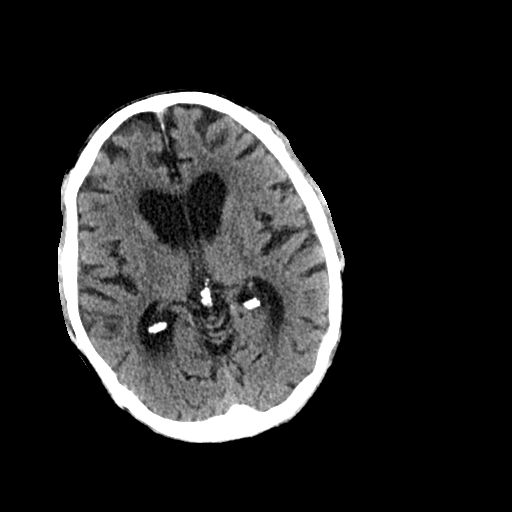
[im 17/33  brain]
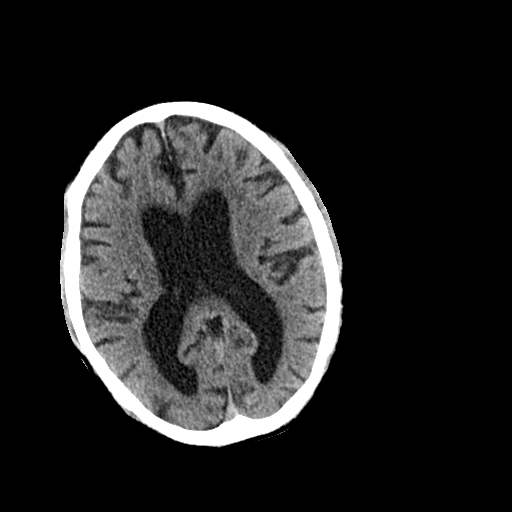
[im 18/33  brain]
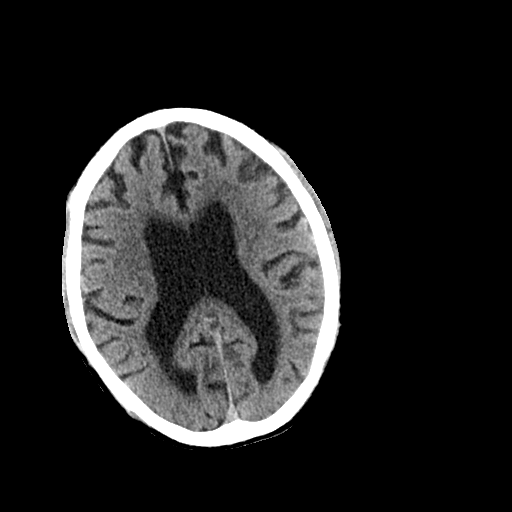
[im 18/33  bone]
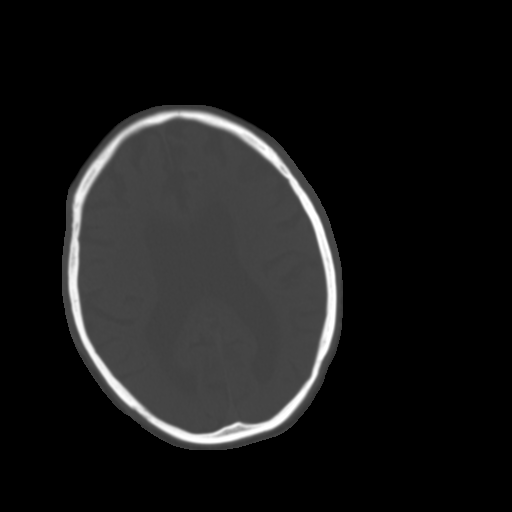
[im 20/33  brain]
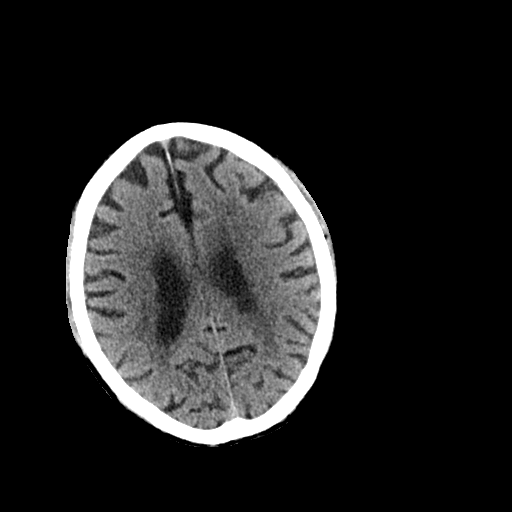
[im 23/33  brain]
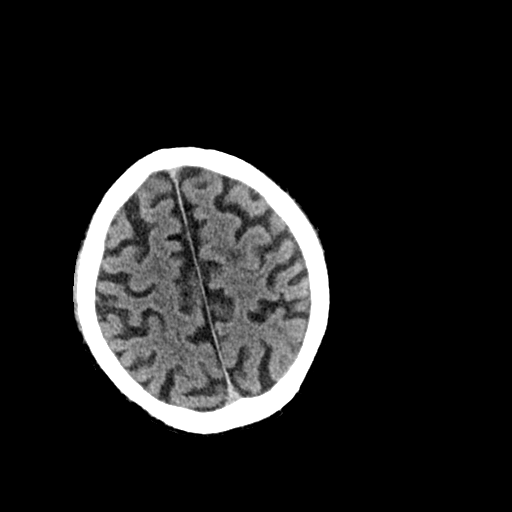
[im 25/33  brain]
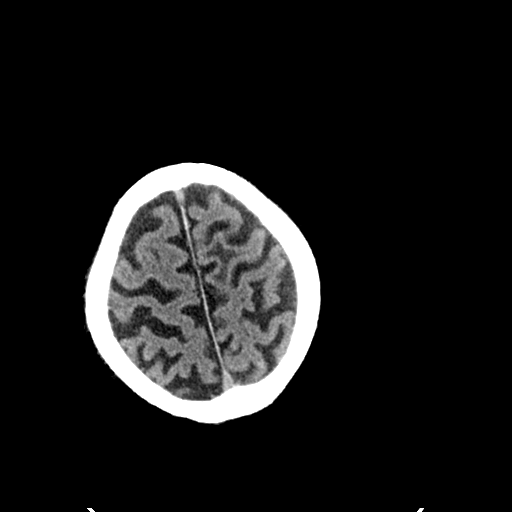
[im 27/33  brain]
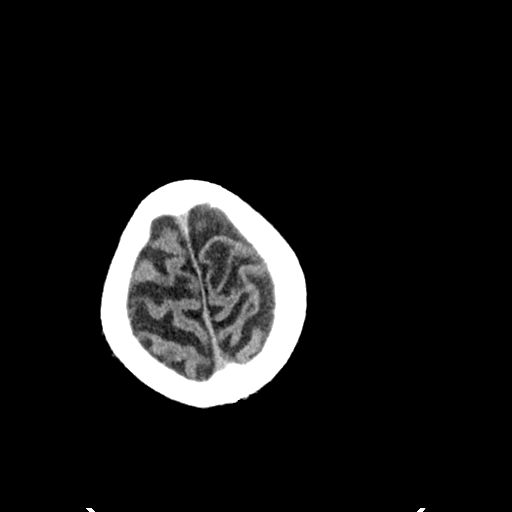
[im 27/33  bone]
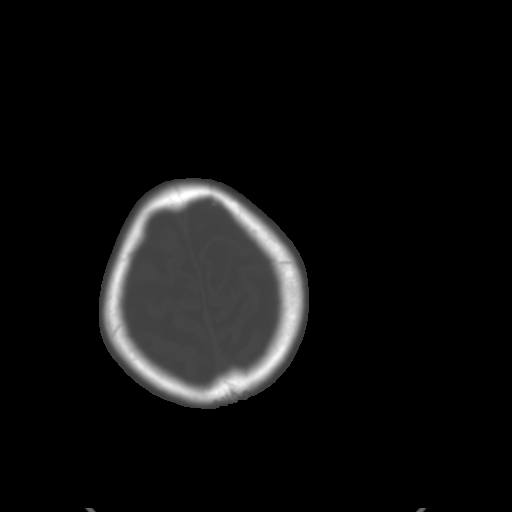
[im 29/33  brain]
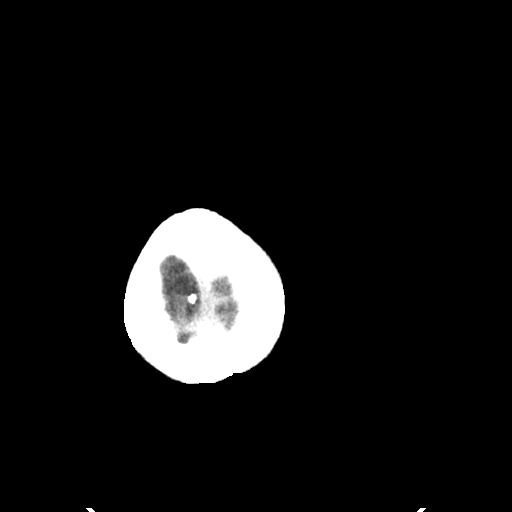
[im 31/33  brain]
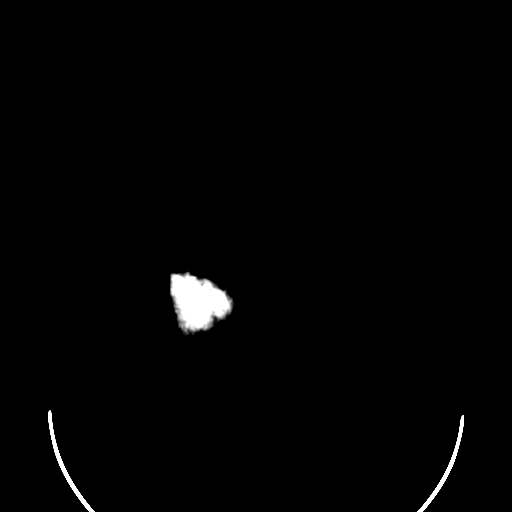

[15 of 30 positions shown; findings below may reference images not displayed]

FINDINGS: Small infarct of the left frontal vertex which appears remote on
images 24-28 of series 2.

Periventricular white matter and corona radiata hypodensities favor
chronic ischemic microvascular white matter disease. Otherwise, The
brainstem, cerebellum, cerebral peduncles, thalamus, basal ganglia,
basilar cisterns, and ventricular system appear within normal
limits. No intracranial hemorrhage, mass lesion, or acute CVA.

Remote right medial orbital wall blowout fracture with herniation of
orbital adipose tissue into the fracture site. Similar but less
striking finding on the left. Chronic ethmoid sinusitis with mucosal
swelling in the nasal cavity.

There is atherosclerotic calcification of the cavernous carotid
arteries bilaterally.
IMPRESSION: 1. No acute intracranial findings are identified.
2. Remote appearing small left frontal vertex infarct.
3. Periventricular white matter and corona radiata hypodensities
favor chronic ischemic microvascular white matter disease.
4. Old medial orbital wall blowout fractures, larger on the right
than the left.
5. Chronic ethmoid sinusitis.  Mucosal swelling in the nasal cavity.

## 2015-08-24 IMAGING — CR DG CHEST 1V PORT
1 series · 1 of 1 positions shown · non-contrast
Comparison: 04/05/2014

CLINICAL DATA: Shortness of breath.  Bilateral leg swelling.

EXAM:
PORTABLE CHEST - 1 VIEW

[AP]
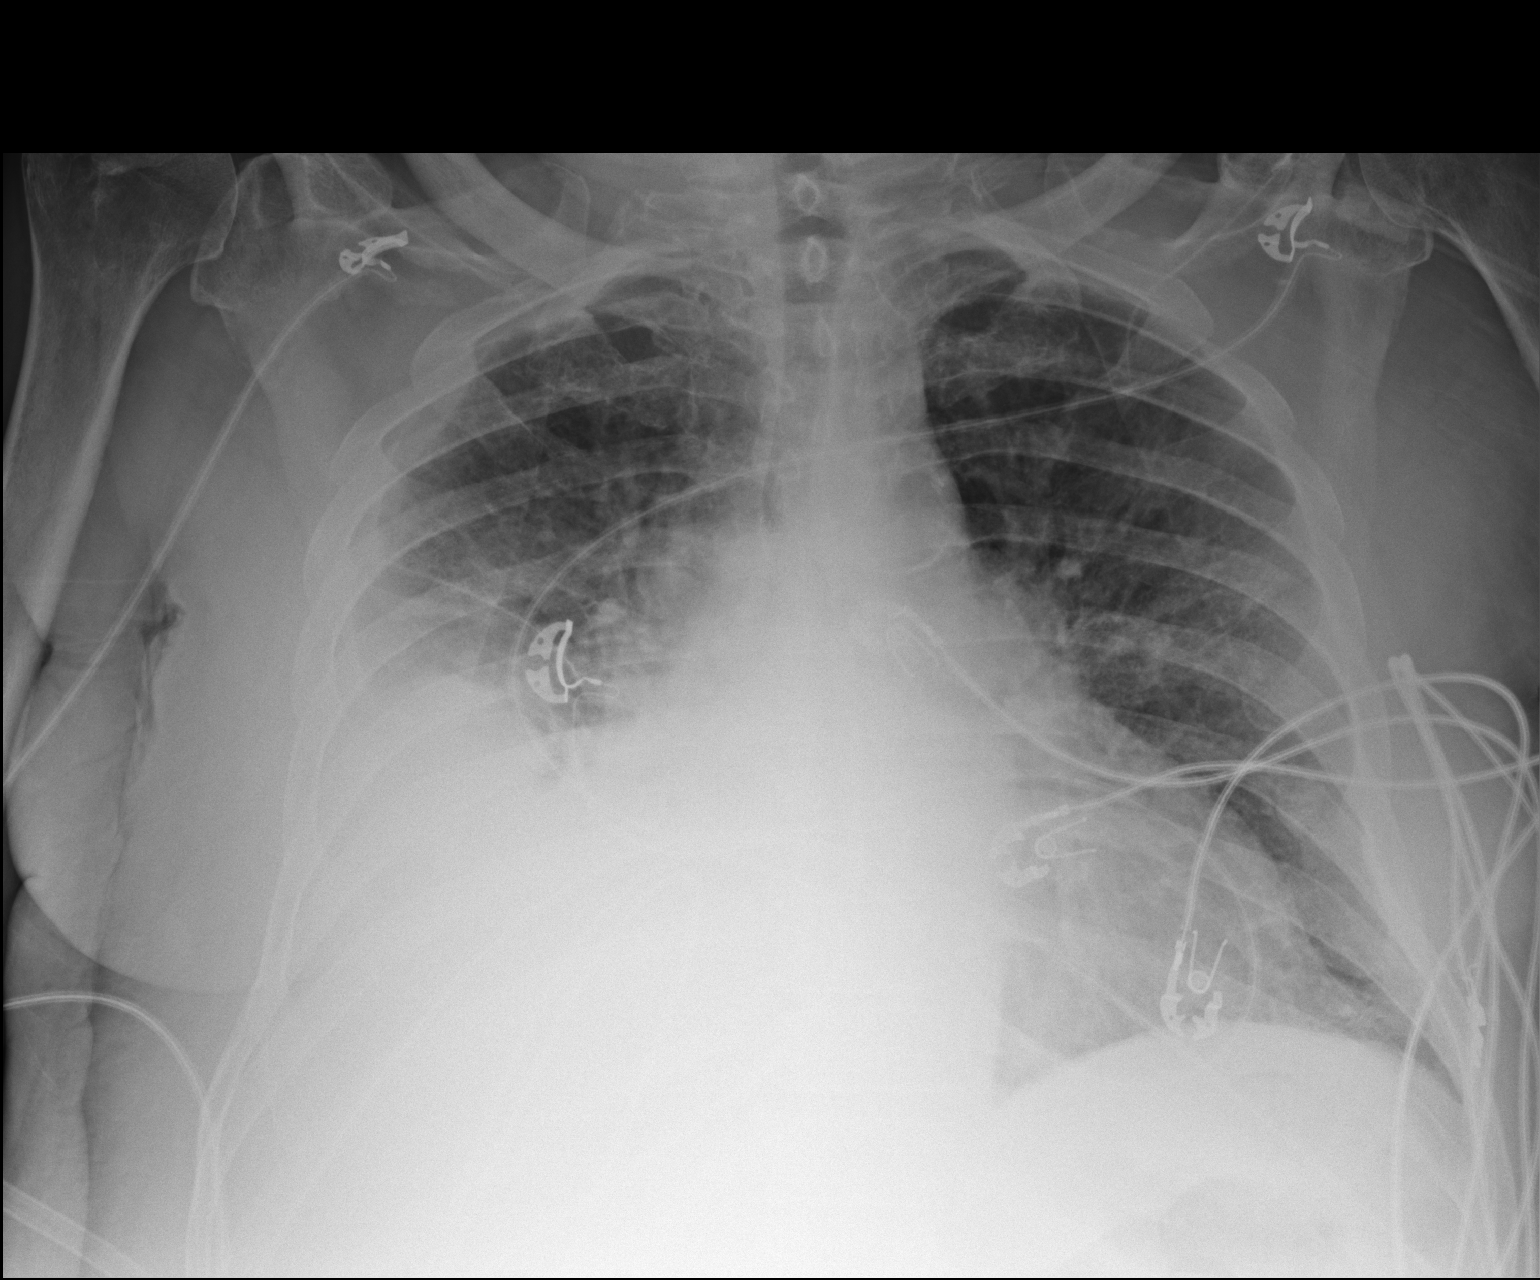

[1 of 1 positions shown; findings below may reference images not displayed]

FINDINGS: Shallow inspiration. Cardiac enlargement with pulmonary vascular
congestion and perihilar edema. Moderate size right pleural effusion
with basilar atelectasis. Changes are similar to those seen on
previous study. No pneumothorax. Calcification of aorta.
IMPRESSION: Cardiac enlargement with pulmonary vascular congestion and perihilar
edema. Right pleural effusion with basilar atelectasis.

## 2015-09-18 IMAGING — CR DG CHEST 1V PORT
1 series · 1 of 1 positions shown · non-contrast
Comparison: Chest radiograph performed 07/15/2014

CLINICAL DATA: Follow-up pleural effusion.  Subsequent encounter.

EXAM:
PORTABLE CHEST - 1 VIEW

[AP]
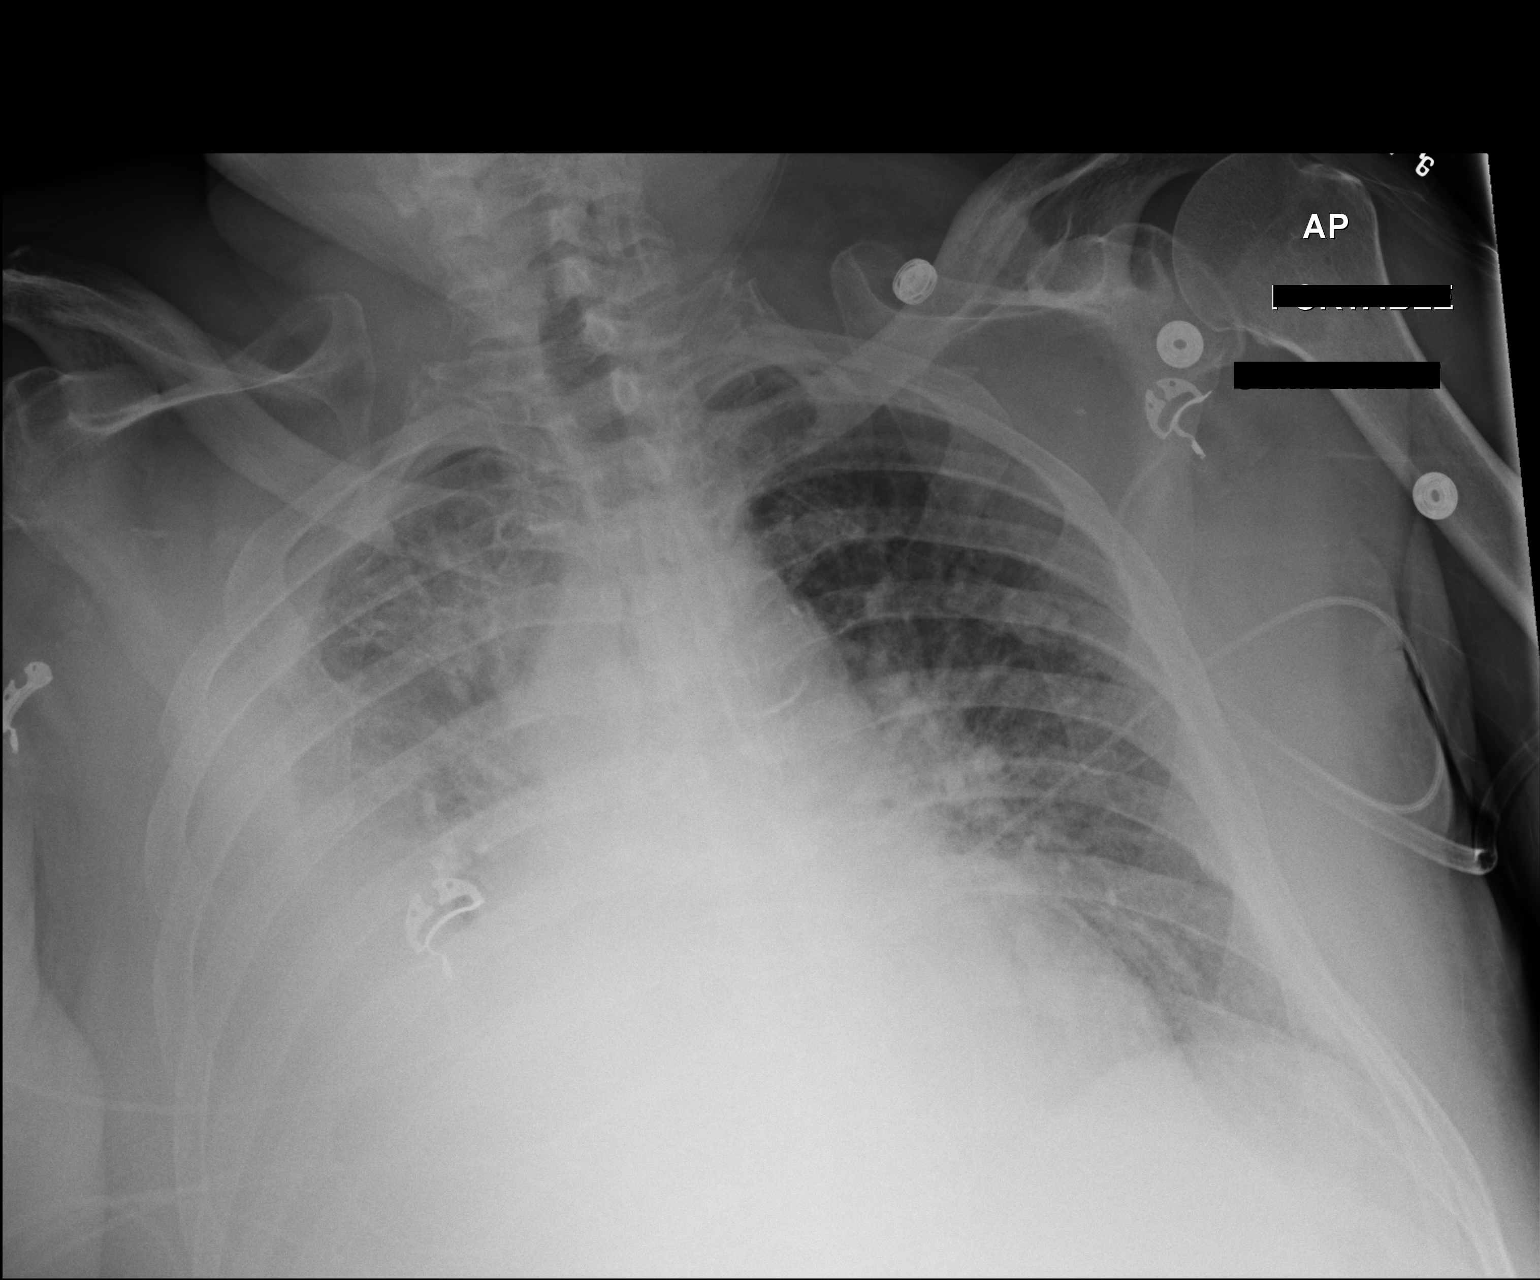

[1 of 1 positions shown; findings below may reference images not displayed]

FINDINGS: A relatively large right-sided pleural effusion is again noted,
stable from the prior study. Underlying airspace opacification is
again noted. Vascular congestion is noted. Findings raise concern
for asymmetric pulmonary edema, though pneumonia cannot be excluded.
No pneumothorax is seen.

The cardiomediastinal silhouette is enlarged. No acute osseous
abnormalities are identified.
IMPRESSION: 1. Relatively large right-sided pleural effusion is stable from the
prior study. Underlying airspace opacification and vascular
congestion noted. Findings raise concern for asymmetric pulmonary
edema, though pneumonia cannot be excluded.
2. Cardiomegaly.
